# Patient Record
Sex: Male | Born: 1985
Health system: Southern US, Community
[De-identification: ages and names within clinical notes are randomized; demographics above are authoritative.]

## PROBLEM LIST (undated history)

## (undated) DIAGNOSIS — M1990 Unspecified osteoarthritis, unspecified site: Secondary | ICD-10-CM

## (undated) DIAGNOSIS — B019 Varicella without complication: Secondary | ICD-10-CM

## (undated) HISTORY — DX: Unspecified osteoarthritis, unspecified site: M19.90

## (undated) HISTORY — PX: WISDOM TOOTH EXTRACTION: SHX21

## (undated) HISTORY — PX: OTHER SURGICAL HISTORY: SHX169

## (undated) HISTORY — DX: Varicella without complication: B01.9

## (undated) HISTORY — PX: SPINE SURGERY: SHX786

---
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
## 2004-02-28 ENCOUNTER — Emergency Department (HOSPITAL_COMMUNITY): Admission: EM | Admit: 2004-02-28 | Discharge: 2004-02-29 | Payer: Self-pay | Admitting: *Deleted

## 2006-09-02 ENCOUNTER — Emergency Department (HOSPITAL_COMMUNITY): Admission: EM | Admit: 2006-09-02 | Discharge: 2006-09-02 | Payer: Self-pay | Admitting: Emergency Medicine

## 2017-04-26 ENCOUNTER — Ambulatory Visit: Payer: Self-pay | Admitting: Family Medicine

## 2017-05-17 ENCOUNTER — Ambulatory Visit (INDEPENDENT_AMBULATORY_CARE_PROVIDER_SITE_OTHER): Payer: 59 | Admitting: Family Medicine

## 2017-05-17 ENCOUNTER — Encounter: Payer: Self-pay | Admitting: Family Medicine

## 2017-05-17 VITALS — BP 124/66 | HR 90 | Temp 99.1°F | Ht 71.5 in | Wt 253.2 lb

## 2017-05-17 DIAGNOSIS — Z0000 Encounter for general adult medical examination without abnormal findings: Secondary | ICD-10-CM | POA: Diagnosis not present

## 2017-05-17 DIAGNOSIS — E669 Obesity, unspecified: Secondary | ICD-10-CM

## 2017-05-17 DIAGNOSIS — Z13220 Encounter for screening for lipoid disorders: Secondary | ICD-10-CM | POA: Diagnosis not present

## 2017-05-17 NOTE — Patient Instructions (Addendum)
We will try to see if you have had a Tetanus shot (Tdap) with Dr. Loleta ChanceHill- please before the baby comes call us to make sure that you have- because otherwise you should get this before your wife gives birth.   Congrats again!   Tell your wife thank you for sending you our way. Pleasure to meet you today and look forward to being your doctor.   Most folks choose a pediatrician to care for your child but we are trained to take care of children as family doctors so if you want everyone to be cared for in the same place (either when baby born or even years from now we are happy to help)  Schedule a lab visit at the check out desk within 2 weeks. Return for future fasting labs meaning nothing but water after midnight please. Ok to take your medications with water.

## 2017-05-17 NOTE — Progress Notes (Signed)
Phone: [phone]  Subjective:  Patient presents today to establish care.  Prior patient of Dr. Loleta ChanceHill but last seen a few years ago Parke Simmers([facility]). Chief complaint-noted.   See problem oriented charting  The following were reviewed and entered/updated in epic: Past Medical History:  Diagnosis Date  . Chicken pox    Patient Active Problem List   Diagnosis Date Noted  . Obesity (BMI 30.0-34.9) 05/17/2017   Past Surgical History:  Procedure Laterality Date  . none      Family History  Problem Relation Age of Onset  . Diabetes Mother   . Hypertension Mother   . Hypertension Father   . Lung cancer Father        smoker  . Hypertension Brother   . Hypertension Maternal Grandmother   . Lung cancer Maternal Grandmother        non smoker  . Hypertension Paternal Grandmother     Medications- reviewed and updated Current Outpatient Medications  Medication Sig Dispense Refill  . multivitamin (ONE-A-DAY MEN'S) TABS tablet Take 1 tablet by mouth daily.     No current facility-administered medications for this visit.     Allergies-reviewed and updated No Known Allergies  Social History   Social History Narrative   Married 2018. Wife pregnant with first child- daughter.    Wife works IT with [employer].       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 Spears every morning.     ROS--Full ROS was completed Review of Systems  Constitutional: Negative for chills and fever.  HENT: Negative for hearing loss and tinnitus.   Eyes: Negative for blurred vision and double vision.  Respiratory: Negative for cough.   Cardiovascular: Negative for chest pain and palpitations.  Gastrointestinal: Negative for heartburn and nausea.  Genitourinary: Negative for dysuria and urgency.  Musculoskeletal: Negative for myalgias and neck pain.  Skin: Negative for  itching and rash.  Neurological: Negative for dizziness and headaches.  Endo/Heme/Allergies: Negative for polydipsia. Does not bruise/bleed easily.  Psychiatric/Behavioral: Negative for hallucinations and substance abuse.   Objective: BP 124/66 (BP Location: Left Arm, Patient Position: Sitting, Cuff Size: Large)   Pulse 90   Temp 99.1 F (37.3 C) (Oral)   Ht 5' 11.5" (1.816 m)   Wt 253 lb 3.2 oz (114.9 kg)   SpO2 95%   BMI 34.82 kg/m  Gen: NAD, resting comfortably, muscular build but has adiposity as well HEENT: Mucous membranes are moist. Oropharynx normal. TM normal. Eyes: sclera and lids normal, PERRLA Neck: no thyromegaly, no cervical lymphadenopathy CV: RRR no murmurs rubs or gallops Lungs: CTAB no crackles, wheeze, rhonchi Abdomen: soft/nontender/nondistended/normal bowel sounds. No rebound or guarding.  Ext: no edema Skin: warm, dry Neuro: 5/5 strength in upper and lower extremities, normal gait, normal reflexes  Assessment/Plan:  32 y.o. male presenting for annual physical.  Health Maintenance counseling: 1. Anticipatory guidance: Patient counseled regarding regular dental exams -q6 months, eye exams -no issues, wearing seatbelts.  2. Risk factor reduction:  Advised patient of need for regular exercise and diet rich and fruits and vegetables to reduce risk of heart attack and stroke. Exercise- 6 days a week. Diet-does pretty well M-Thursday. Weekends tend to be worse. Marland Kitchen. He would like to be around 240. Weight in college was 220-240- in hs had been as high as 270  Wt Readings from Last 3 Encounters:  05/17/17 253 lb 3.2 oz (114.9 kg)  3. Immunizations/screenings/ancillary studies- Tdap- get records- needs to have this if has not within 10 years with child on the way and needing pertusis immunizaation. Declines influenza shot. Advised to do once has first child.  4. Prostate cancer screening- no family history, start at age around 35.   5. Colon cancer screening - no family  history, start at age 66-50 61. Testicular cancer screening- advised monthly self exams  8. STD screening- patient opts out as monogamous. Was not tested before marriage- wife will be tested in pregnancy so he opts out   Status of chronic or acute concerns  Obesity- discussed as above working on weight loss- 230 is a reasonable goal with his build  Lab/Order associations: Preventative health care - Plan: CBC, Comprehensive metabolic panel, Lipid panel  Obesity (BMI 30.0-34.9) - Plan: CBC, Comprehensive metabolic panel  Screening for hyperlipidemia - Plan: Lipid panel  Return precautions advised.  Tana Conch, MD

## 2017-06-07 ENCOUNTER — Other Ambulatory Visit (INDEPENDENT_AMBULATORY_CARE_PROVIDER_SITE_OTHER): Payer: 59

## 2017-06-07 DIAGNOSIS — Z13220 Encounter for screening for lipoid disorders: Secondary | ICD-10-CM

## 2017-06-07 DIAGNOSIS — E669 Obesity, unspecified: Secondary | ICD-10-CM

## 2017-06-07 DIAGNOSIS — Z0000 Encounter for general adult medical examination without abnormal findings: Secondary | ICD-10-CM

## 2017-06-07 LAB — COMPREHENSIVE METABOLIC PANEL
ALT: 23 U/L (ref 0–53)
AST: 29 U/L (ref 0–37)
Albumin: 4 g/dL (ref 3.5–5.2)
Alkaline Phosphatase: 45 U/L (ref 39–117)
BILIRUBIN TOTAL: 1.1 mg/dL (ref 0.2–1.2)
BUN: 15 mg/dL (ref 6–23)
CALCIUM: 9.9 mg/dL (ref 8.4–10.5)
CO2: 32 meq/L (ref 19–32)
Chloride: 101 mEq/L (ref 96–112)
Creatinine, Ser: 1.08 mg/dL (ref 0.40–1.50)
GFR: 101.66 mL/min (ref >60.00)
Glucose, Bld: 89 mg/dL (ref 70–99)
Potassium: 4.2 mEq/L (ref 3.5–5.1)
Sodium: 139 mEq/L (ref 135–145)
Total Protein: 7.8 g/dL (ref 6.0–8.3)

## 2017-06-07 LAB — LIPID PANEL
CHOL/HDL RATIO: 3
Cholesterol: 158 mg/dL (ref 0–200)
HDL: 45.8 mg/dL (ref >39.00)
LDL CALC: 99 mg/dL (ref 0–99)
NonHDL: 111.8
TRIGLYCERIDES: 62 mg/dL (ref 0.0–149.0)
VLDL: 12.4 mg/dL (ref 0.0–40.0)

## 2017-06-07 LAB — CBC
HCT: 45.5 % (ref 39.0–52.0)
Hemoglobin: 15.6 g/dL (ref 13.0–17.0)
MCHC: 34.4 g/dL (ref 30.0–36.0)
MCV: 86.9 fl (ref 78.0–100.0)
PLATELETS: 234 10*3/uL (ref 150.0–400.0)
RBC: 5.24 Mil/uL (ref 4.22–5.81)
RDW: 13 % (ref 11.5–15.5)
WBC: 4.9 10*3/uL (ref 4.0–10.5)

## 2017-07-29 DIAGNOSIS — S335XXA Sprain of ligaments of lumbar spine, initial encounter: Secondary | ICD-10-CM | POA: Diagnosis not present

## 2017-07-29 DIAGNOSIS — M545 Low back pain: Secondary | ICD-10-CM | POA: Diagnosis not present

## 2017-07-29 DIAGNOSIS — G441 Vascular headache, not elsewhere classified: Secondary | ICD-10-CM | POA: Diagnosis not present

## 2017-07-29 DIAGNOSIS — M546 Pain in thoracic spine: Secondary | ICD-10-CM | POA: Diagnosis not present

## 2017-07-31 ENCOUNTER — Encounter (HOSPITAL_COMMUNITY): Payer: Self-pay

## 2017-07-31 ENCOUNTER — Inpatient Hospital Stay (HOSPITAL_COMMUNITY): Payer: 59

## 2017-07-31 ENCOUNTER — Encounter (HOSPITAL_COMMUNITY)
Admission: EM | Disposition: A | Discharge status: Rehabilitation Facility | Payer: Self-pay | Source: Home / Self Care | Attending: Neurological Surgery

## 2017-07-31 ENCOUNTER — Other Ambulatory Visit: Payer: Self-pay

## 2017-07-31 ENCOUNTER — Emergency Department (HOSPITAL_COMMUNITY): Payer: 59 | Admitting: Anesthesiology

## 2017-07-31 ENCOUNTER — Emergency Department (HOSPITAL_COMMUNITY): Payer: 59

## 2017-07-31 ENCOUNTER — Inpatient Hospital Stay (HOSPITAL_COMMUNITY)
Admission: EM | Admit: 2017-07-31 | Discharge: 2017-08-05 | DRG: 519 | Disposition: A | Discharge status: Rehabilitation Facility | Payer: 59 | Attending: Neurological Surgery | Admitting: Neurological Surgery

## 2017-07-31 DIAGNOSIS — G992 Myelopathy in diseases classified elsewhere: Secondary | ICD-10-CM | POA: Diagnosis not present

## 2017-07-31 DIAGNOSIS — E669 Obesity, unspecified: Secondary | ICD-10-CM | POA: Diagnosis not present

## 2017-07-31 DIAGNOSIS — G834 Cauda equina syndrome: Secondary | ICD-10-CM | POA: Diagnosis not present

## 2017-07-31 DIAGNOSIS — M62838 Other muscle spasm: Secondary | ICD-10-CM | POA: Diagnosis not present

## 2017-07-31 DIAGNOSIS — I6789 Other cerebrovascular disease: Secondary | ICD-10-CM | POA: Diagnosis not present

## 2017-07-31 DIAGNOSIS — E6609 Other obesity due to excess calories: Secondary | ICD-10-CM | POA: Diagnosis not present

## 2017-07-31 DIAGNOSIS — M4714 Other spondylosis with myelopathy, thoracic region: Secondary | ICD-10-CM | POA: Diagnosis not present

## 2017-07-31 DIAGNOSIS — M5106 Intervertebral disc disorders with myelopathy, lumbar region: Secondary | ICD-10-CM | POA: Diagnosis present

## 2017-07-31 DIAGNOSIS — S24109A Unspecified injury at unspecified level of thoracic spinal cord, initial encounter: Secondary | ICD-10-CM | POA: Diagnosis not present

## 2017-07-31 DIAGNOSIS — S335XXA Sprain of ligaments of lumbar spine, initial encounter: Secondary | ICD-10-CM | POA: Diagnosis not present

## 2017-07-31 DIAGNOSIS — G959 Disease of spinal cord, unspecified: Secondary | ICD-10-CM | POA: Diagnosis present

## 2017-07-31 DIAGNOSIS — R339 Retention of urine, unspecified: Secondary | ICD-10-CM | POA: Diagnosis not present

## 2017-07-31 DIAGNOSIS — Z6833 Body mass index (BMI) 33.0-33.9, adult: Secondary | ICD-10-CM | POA: Diagnosis not present

## 2017-07-31 DIAGNOSIS — M7989 Other specified soft tissue disorders: Secondary | ICD-10-CM | POA: Diagnosis not present

## 2017-07-31 DIAGNOSIS — K5903 Drug induced constipation: Secondary | ICD-10-CM | POA: Diagnosis not present

## 2017-07-31 DIAGNOSIS — Z4789 Encounter for other orthopedic aftercare: Secondary | ICD-10-CM | POA: Diagnosis not present

## 2017-07-31 DIAGNOSIS — K5900 Constipation, unspecified: Secondary | ICD-10-CM | POA: Diagnosis not present

## 2017-07-31 DIAGNOSIS — K30 Functional dyspepsia: Secondary | ICD-10-CM | POA: Diagnosis not present

## 2017-07-31 DIAGNOSIS — R202 Paresthesia of skin: Secondary | ICD-10-CM | POA: Diagnosis not present

## 2017-07-31 DIAGNOSIS — D72829 Elevated white blood cell count, unspecified: Secondary | ICD-10-CM | POA: Diagnosis not present

## 2017-07-31 DIAGNOSIS — M546 Pain in thoracic spine: Secondary | ICD-10-CM | POA: Diagnosis not present

## 2017-07-31 DIAGNOSIS — K567 Ileus, unspecified: Secondary | ICD-10-CM | POA: Diagnosis not present

## 2017-07-31 DIAGNOSIS — M792 Neuralgia and neuritis, unspecified: Secondary | ICD-10-CM | POA: Diagnosis not present

## 2017-07-31 DIAGNOSIS — G441 Vascular headache, not elsewhere classified: Secondary | ICD-10-CM | POA: Diagnosis not present

## 2017-07-31 DIAGNOSIS — M4804 Spinal stenosis, thoracic region: Secondary | ICD-10-CM | POA: Diagnosis not present

## 2017-07-31 DIAGNOSIS — F411 Generalized anxiety disorder: Secondary | ICD-10-CM | POA: Diagnosis not present

## 2017-07-31 DIAGNOSIS — Z419 Encounter for procedure for purposes other than remedying health state, unspecified: Secondary | ICD-10-CM

## 2017-07-31 DIAGNOSIS — Z981 Arthrodesis status: Secondary | ICD-10-CM | POA: Diagnosis not present

## 2017-07-31 DIAGNOSIS — R030 Elevated blood-pressure reading, without diagnosis of hypertension: Secondary | ICD-10-CM | POA: Diagnosis not present

## 2017-07-31 DIAGNOSIS — M545 Low back pain: Secondary | ICD-10-CM | POA: Diagnosis not present

## 2017-07-31 DIAGNOSIS — M4802 Spinal stenosis, cervical region: Secondary | ICD-10-CM | POA: Diagnosis not present

## 2017-07-31 DIAGNOSIS — M25562 Pain in left knee: Secondary | ICD-10-CM | POA: Diagnosis not present

## 2017-07-31 DIAGNOSIS — R200 Anesthesia of skin: Secondary | ICD-10-CM | POA: Diagnosis not present

## 2017-07-31 DIAGNOSIS — G8918 Other acute postprocedural pain: Secondary | ICD-10-CM | POA: Diagnosis not present

## 2017-07-31 LAB — BASIC METABOLIC PANEL
Anion gap: 7 (ref 5–15)
BUN: 14 mg/dL (ref 6–20)
CO2: 27 mmol/L (ref 22–32)
Calcium: 9.3 mg/dL (ref 8.9–10.3)
Chloride: 104 mmol/L (ref 101–111)
Creatinine, Ser: 1 mg/dL (ref 0.61–1.24)
GFR calc Af Amer: >60 mL/min (ref >60)
Glucose, Bld: 89 mg/dL (ref 65–99)
POTASSIUM: 3.8 mmol/L (ref 3.5–5.1)
SODIUM: 138 mmol/L (ref 135–145)

## 2017-07-31 LAB — CBC
HCT: 44.8 % (ref 39.0–52.0)
Hemoglobin: 15.4 g/dL (ref 13.0–17.0)
MCH: 29.4 pg (ref 26.0–34.0)
MCHC: 34.4 g/dL (ref 30.0–36.0)
MCV: 85.7 fL (ref 78.0–100.0)
PLATELETS: 214 10*3/uL (ref 150–400)
RBC: 5.23 MIL/uL (ref 4.22–5.81)
RDW: 12.4 % (ref 11.5–15.5)
WBC: 9.3 10*3/uL (ref 4.0–10.5)

## 2017-07-31 LAB — TYPE AND SCREEN
ABO/RH(D): O POS
Antibody Screen: NEGATIVE

## 2017-07-31 LAB — ABO/RH: ABO/RH(D): O POS

## 2017-07-31 SURGERY — THORACIC LAMINECTOMY FOR TUMOR
Anesthesia: General

## 2017-07-31 MED ORDER — MORPHINE SULFATE (PF) 4 MG/ML IV SOLN
4.0000 mg | Freq: Once | INTRAVENOUS | Status: AC
  Administered 2017-07-31: 4 mg via INTRAVENOUS
  Filled 2017-07-31: qty 1

## 2017-07-31 MED ORDER — SUCCINYLCHOLINE CHLORIDE 200 MG/10ML IV SOSY
PREFILLED_SYRINGE | INTRAVENOUS | Status: AC
  Filled 2017-07-31: qty 10

## 2017-07-31 MED ORDER — PROPOFOL 10 MG/ML IV BOLUS
INTRAVENOUS | Status: AC
  Filled 2017-07-31: qty 20

## 2017-07-31 MED ORDER — ONDANSETRON HCL 4 MG/2ML IJ SOLN
4.0000 mg | Freq: Once | INTRAMUSCULAR | Status: AC
  Administered 2017-07-31: 4 mg via INTRAVENOUS
  Filled 2017-07-31: qty 2

## 2017-07-31 MED ORDER — ACETAMINOPHEN 650 MG RE SUPP: 650.0000 mg | Freq: Four times a day (QID) | RECTAL | Status: DC | PRN

## 2017-07-31 MED ORDER — MIDAZOLAM HCL 2 MG/2ML IJ SOLN
INTRAMUSCULAR | Status: AC
  Filled 2017-07-31: qty 2

## 2017-07-31 MED ORDER — POTASSIUM CHLORIDE IN NACL 20-0.9 MEQ/L-% IV SOLN
INTRAVENOUS | Status: DC
  Administered 2017-08-01: 01:00:00 via INTRAVENOUS
  Filled 2017-07-31 (×3): qty 1000

## 2017-07-31 MED ORDER — LIDOCAINE 2% (20 MG/ML) 5 ML SYRINGE
INTRAMUSCULAR | Status: AC
  Filled 2017-07-31: qty 5

## 2017-07-31 MED ORDER — ACETAMINOPHEN 325 MG PO TABS: 650.0000 mg | ORAL_TABLET | Freq: Four times a day (QID) | ORAL | Status: DC | PRN

## 2017-07-31 MED ORDER — SODIUM CHLORIDE 0.9 % IV SOLN
Freq: Once | INTRAVENOUS | Status: AC
  Administered 2017-07-31: 21:00:00 via INTRAVENOUS

## 2017-07-31 MED ORDER — DEXAMETHASONE SODIUM PHOSPHATE 4 MG/ML IJ SOLN
4.0000 mg | Freq: Four times a day (QID) | INTRAMUSCULAR | Status: DC
  Administered 2017-08-01 (×3): 4 mg via INTRAVENOUS
  Filled 2017-07-31 (×3): qty 1

## 2017-07-31 MED ORDER — HYDROCODONE-ACETAMINOPHEN 5-325 MG PO TABS: 1.0000 | ORAL_TABLET | ORAL | Status: DC | PRN

## 2017-07-31 MED ORDER — METHYLPREDNISOLONE SODIUM SUCC 125 MG IJ SOLR
125.0000 mg | Freq: Once | INTRAMUSCULAR | Status: DC
  Filled 2017-07-31: qty 2

## 2017-07-31 MED ORDER — DEXAMETHASONE SODIUM PHOSPHATE 10 MG/ML IJ SOLN
10.0000 mg | Freq: Once | INTRAMUSCULAR | Status: AC
  Administered 2017-07-31: 10 mg via INTRAVENOUS
  Filled 2017-07-31: qty 1

## 2017-07-31 MED ORDER — SENNA 8.6 MG PO TABS: 1.0000 | ORAL_TABLET | Freq: Two times a day (BID) | ORAL | Status: DC

## 2017-07-31 MED ORDER — CEFAZOLIN SODIUM-DEXTROSE 2-4 GM/100ML-% IV SOLN
INTRAVENOUS | Status: AC
  Filled 2017-07-31: qty 100

## 2017-07-31 MED ORDER — SODIUM CHLORIDE 0.9% FLUSH: 3.0000 mL | Freq: Two times a day (BID) | INTRAVENOUS | Status: DC

## 2017-07-31 MED ORDER — FENTANYL CITRATE (PF) 250 MCG/5ML IJ SOLN
INTRAMUSCULAR | Status: AC
  Filled 2017-07-31: qty 5

## 2017-07-31 MED ORDER — ROCURONIUM BROMIDE 10 MG/ML (PF) SYRINGE
PREFILLED_SYRINGE | INTRAVENOUS | Status: AC
  Filled 2017-07-31: qty 5

## 2017-07-31 NOTE — ED Triage Notes (Signed)
Patient here by EMS after throwing a golf ball and feeling a pop in his legs and then could not move them.  Were all numb but now improving.  Pulses intact bilaterally.  A&Ox4.  Stated he has been walking funny for 2 weeks after a workout.  Seen a chiropractor with no improvement.

## 2017-07-31 NOTE — ED Notes (Signed)
Report given to OR RN   All belongings to be taken with patient.

## 2017-07-31 NOTE — ED Notes (Signed)
Patient transported to CT scan and then to MRI .

## 2017-07-31 NOTE — H&P (Signed)
Scott Lane is an 32 y.o. male.   HPI:  32 year old male came in to the ER today after an episode of leg weakness. States that he was throwing a golf ball when he felt an "electric shock" and unable to move his legs. Over the last 2.5 weeks he has noticed some leg weakness especially in his left leg but he has been able to walk. Did report some limping in his left leg. He has been seeing a [facility]. He denies any loss of bowel or bladder control. Reports numbness and tingling from below his nipple down. States that his leg strength has improved since the initial injury. Does not have any significant medical history.   Past Medical History:  Diagnosis Date  . Chicken pox     Past Surgical History:  Procedure Laterality Date  . none      No Known Allergies  Social History   Tobacco Use  . Smoking status: Never Smoker  . Smokeless tobacco: Never Used  Substance Use Topics  . Alcohol use: Yes    Alcohol/week: 1.2 oz    Types: 2 Standard drinks or equivalent per week    Family History  Problem Relation Age of Onset  . Diabetes Mother   . Hypertension Mother   . Hypertension Father   . Lung cancer Father        smoker  . Hypertension Brother   . Hypertension Maternal Grandmother   . Lung cancer Maternal Grandmother        non smoker  . Hypertension Paternal Grandmother      Review of Systems  Positive ROS: N and T from below nipple line down  All other systems have been reviewed and were otherwise negative with the exception of those mentioned in the HPI and as above.  Objective: Vital signs in last 24 hours: Temp:  [98.2 F (36.8 C)-98.3 F (36.8 C)] 98.3 F (36.8 C) (04/24 2103) Pulse Rate:  [81-82] 82 (04/24 2103) Resp:  [18-19] 18 (04/24 2103) BP: (126-127)/(78-86) 126/86 (04/24 2103) SpO2:  [97 %-98 %] 98 % (04/24 2103)  General Appearance: Alert, cooperative, no distress, appears stated age Head: Normocephalic, without obvious abnormality, atraumatic    Lungs: respirations unlabored Heart: Regular rate and rhythm Extremities: Extremities normal, atraumatic, no cyanosis or edema Pulses: 2+ and symmetric all extremities Skin: Skin color, texture, turgor normal, no rashes or lesions  NEUROLOGIC:   Mental status: A&O x4, no aphasia, good attention span, Memory and fund of knowledge Motor Exam - dorsiflexion 4/5 right and 3/5 left.  Sensory Exam - decreased sensation below the nipple line down. Reflexes: hyperreflexive Positive  Hoffman's, Positive clonus Coordination - grossly normal Gait - not tested Balance - not tested Cranial Nerves: I: smell Not tested  II: visual acuity  OS: na   OD: na  II: visual fields Full to confrontation  II: pupils   III,VII: ptosis None  III,IV,VI: extraocular muscles  Full ROM  V: mastication   V: facial light touch sensation    V,VII: corneal reflex    VII: facial muscle function - upper    VII: facial muscle function - lower   VIII: hearing Not tested  IX: soft palate elevation    IX,X: gag reflex   XI: trapezius strength    XI: sternocleidomastoid strength   XI: neck flexion strength    XII: tongue strength      Data Review Lab Results  Component Value Date   WBC 9.3  07/31/2017   HGB 15.4 07/31/2017   HCT 44.8 07/31/2017   MCV 85.7 07/31/2017   PLT 214 07/31/2017   Lab Results  Component Value Date   NA 138 07/31/2017   K 3.8 07/31/2017   CL 104 07/31/2017   CO2 27 07/31/2017   BUN 14 07/31/2017   CREATININE 1.00 07/31/2017   GLUCOSE 89 07/31/2017   No results found for: INR, PROTIME  Radiology: Mr Thoracic Spine Wo Contrast  Result Date: 07/31/2017 CLINICAL DATA:  The patient felt a pop in his legs with onset of inability to move them after throwing a golf ball today. Numbness. EXAM: MRI THORACIC AND LUMBAR SPINE WITHOUT CONTRAST TECHNIQUE: Multiplanar and multiecho pulse sequences of the thoracic and lumbar spine were obtained without intravenous contrast. COMPARISON:   None. FINDINGS: MRI THORACIC SPINE FINDINGS Alignment:  Maintained. Vertebrae: Height and signal are normal. This patient has a congenitally narrow central spinal canal due to short pedicle length throughout. Cord: There appears to be edema within the distal cord at the inferior margin at T10-11 just superior to the disc interspace. There may also be edema within the cord at T11-12 Paraspinal and other soft tissues: Negative. Disc levels: C7-T1: Negative. T1-2: There is ligamentum flavum thickening and a minimal disc bulge but the central spinal canal and foramina appear open. T1-2: Shallow bulge to the left effaces the ventral thecal sac. The foramina appear open. T2-3: There is ligamentum flavum thickening and a disc bulge. The cord is markedly deformed with a triangular configuration identified. Marked left foraminal narrowing is seen. T3-4: Shallow disc bulge effaces the ventral thecal sac. The foramina appear open. T4-5: Shallow disc bulge without stenosis. T5-6: Negative. T6-7: Ligamentum flavum thickening. No disc bulge or protrusion. The central canal and foramina are open. T7-8: Ligamentum flavum thickening.  Otherwise negative. T8-9: Ligamentum flavum thickening.  Otherwise negative. T9-10: There is ligamentum flavum thickening and a shallow disc bulge. Moderate central canal narrowing is present. The foramina appear open. T10-11: There is bulky ligamentum flavum thickening and a shallow disc bulge. There is severe central canal stenosis and marked flattening of the cord. Severe bilateral foraminal narrowing is also seen. T11-12: Bulky ligamentum flavum thickening. No disc bulge. The cord is deformed. The foramina are narrowed bilaterally. MRI LUMBAR SPINE FINDINGS Segmentation:  Standard. Alignment:  Maintained. Vertebrae: No fracture or worrisome lesion. Congenitally narrow central canal due to short pedicle length noted. Conus medullaris and cauda equina: Conus extends to the L1 level. Conus and cauda  equina appear normal. Paraspinal and other soft tissues: Negative. Disc levels: L1-2 is imaged in the sagittal plane only. There is a shallow disc bulge but the central canal and foramina appear open. L2-3: Negative. L3-4: Shallow disc bulge and ligamentum flavum thickening. The central canal and foramina remain open. There is some facet arthropathy. L4-5: Broad-based right paracentral disc protrusion causes narrowing in the right subarticular recess and impingement on the descending right L5 root. The ventral thecal sac is deformed by disc. Disc also results in mild to moderate right foraminal narrowing. The left foramen is open. L5-S1: Shallow disc bulge with an annular fissure. The central canal is open. Moderate to moderately severe bilateral foraminal narrowing is present. IMPRESSION: MR THORACIC SPINE IMPRESSION Marked congenital narrowing of the central canal throughout due to short pedicle length. Severe central canal stenosis at T9-10 due to a shallow disc bulge and bulky ligamentum flavum thickening. There appears to be edema within the cord at this level. Marked central canal  stenosis at T11-12 where there is bulky ligamentum flavum thickening. There appears to be edema within the cord at this level. Ligamentum flavum thickening and a shallow disc bulge at T2-3 cause moderately severe to severe central canal stenosis. The cord has an abnormal triangular configuration. No edema within the cord is seen at this level. There is also left foraminal narrowing at this level. MR LUMBAR SPINE IMPRESSION Congenitally narrow central spinal canal throughout. Broad-based right paracentral protrusion at L4-5 impinges on the descending right L5 root and causes moderate right foraminal narrowing. Moderate to moderately severe bilateral foraminal narrowing L5-S1. Critical Value/emergent results were called by telephone at the time of interpretation on 07/31/2017 at 8:36 pm to Dr. Shaune PollackAMERON ISAACS , who verbally acknowledged  these results. Electronically Signed   By: Drusilla Kannerhomas  Dalessio M.D.   On: 07/31/2017 20:43   Mr Lumbar Spine Wo Contrast  Result Date: 07/31/2017 CLINICAL DATA:  The patient felt a pop in his legs with onset of inability to move them after throwing a golf ball today. Numbness. EXAM: MRI THORACIC AND LUMBAR SPINE WITHOUT CONTRAST TECHNIQUE: Multiplanar and multiecho pulse sequences of the thoracic and lumbar spine were obtained without intravenous contrast. COMPARISON:  None. FINDINGS: MRI THORACIC SPINE FINDINGS Alignment:  Maintained. Vertebrae: Height and signal are normal. This patient has a congenitally narrow central spinal canal due to short pedicle length throughout. Cord: There appears to be edema within the distal cord at the inferior margin at T10-11 just superior to the disc interspace. There may also be edema within the cord at T11-12 Paraspinal and other soft tissues: Negative. Disc levels: C7-T1: Negative. T1-2: There is ligamentum flavum thickening and a minimal disc bulge but the central spinal canal and foramina appear open. T1-2: Shallow bulge to the left effaces the ventral thecal sac. The foramina appear open. T2-3: There is ligamentum flavum thickening and a disc bulge. The cord is markedly deformed with a triangular configuration identified. Marked left foraminal narrowing is seen. T3-4: Shallow disc bulge effaces the ventral thecal sac. The foramina appear open. T4-5: Shallow disc bulge without stenosis. T5-6: Negative. T6-7: Ligamentum flavum thickening. No disc bulge or protrusion. The central canal and foramina are open. T7-8: Ligamentum flavum thickening.  Otherwise negative. T8-9: Ligamentum flavum thickening.  Otherwise negative. T9-10: There is ligamentum flavum thickening and a shallow disc bulge. Moderate central canal narrowing is present. The foramina appear open. T10-11: There is bulky ligamentum flavum thickening and a shallow disc bulge. There is severe central canal stenosis  and marked flattening of the cord. Severe bilateral foraminal narrowing is also seen. T11-12: Bulky ligamentum flavum thickening. No disc bulge. The cord is deformed. The foramina are narrowed bilaterally. MRI LUMBAR SPINE FINDINGS Segmentation:  Standard. Alignment:  Maintained. Vertebrae: No fracture or worrisome lesion. Congenitally narrow central canal due to short pedicle length noted. Conus medullaris and cauda equina: Conus extends to the L1 level. Conus and cauda equina appear normal. Paraspinal and other soft tissues: Negative. Disc levels: L1-2 is imaged in the sagittal plane only. There is a shallow disc bulge but the central canal and foramina appear open. L2-3: Negative. L3-4: Shallow disc bulge and ligamentum flavum thickening. The central canal and foramina remain open. There is some facet arthropathy. L4-5: Broad-based right paracentral disc protrusion causes narrowing in the right subarticular recess and impingement on the descending right L5 root. The ventral thecal sac is deformed by disc. Disc also results in mild to moderate right foraminal narrowing. The left foramen is open.  L5-S1: Shallow disc bulge with an annular fissure. The central canal is open. Moderate to moderately severe bilateral foraminal narrowing is present. IMPRESSION: MR THORACIC SPINE IMPRESSION Marked congenital narrowing of the central canal throughout due to short pedicle length. Severe central canal stenosis at T9-10 due to a shallow disc bulge and bulky ligamentum flavum thickening. There appears to be edema within the cord at this level. Marked central canal stenosis at T11-12 where there is bulky ligamentum flavum thickening. There appears to be edema within the cord at this level. Ligamentum flavum thickening and a shallow disc bulge at T2-3 cause moderately severe to severe central canal stenosis. The cord has an abnormal triangular configuration. No edema within the cord is seen at this level. There is also left  foraminal narrowing at this level. MR LUMBAR SPINE IMPRESSION Congenitally narrow central spinal canal throughout. Broad-based right paracentral protrusion at L4-5 impinges on the descending right L5 root and causes moderate right foraminal narrowing. Moderate to moderately severe bilateral foraminal narrowing L5-S1. Critical Value/emergent results were called by telephone at the time of interpretation on 07/31/2017 at 8:36 pm to Dr. Shaune Pollack , who verbally acknowledged these results. Electronically Signed   By: Drusilla Kanner M.D.   On: 07/31/2017 20:43    Assessment/Plan: 32 year old male presented to the ER with sudden onset of leg paralysis after throwing a golf ball. MRI thoracic spine shows severe stenosis at T2-3, T9-10 and T10-11. Patients strength has improved since arrival to the ER. Since neurologic exam is improving and we have found him to have hyperreflexia in the upper extremities, we will cancel emergent thoracic decompression and MRI his cervical spine and CT his thoracic spine and plan on surgery in the next 24-72 hours. We are concerned about pharmacologic paralysis in prone positioning for surgery tonight without c-spine imaging. Admit to floor and start decadron (do not believe solumedrol protocol is indicated as risks outweigh benefits). Have discussed all of this with the patient and his family and they agree with the plan.   Tiana Loft [employer] 07/31/2017 10:21 PM

## 2017-07-31 NOTE — ED Provider Notes (Signed)
[facility] EMERGENCY DEPARTMENT Provider Note   CSN: [id number] Arrival date & time: 07/31/17  1826     History   Chief Complaint Chief Complaint  Patient presents with  . Fall  . Leg Injury    HPI MEYER ARORA is a 32 y.o. male.  HPI   32 year old male here with bilateral lower extremity weakness.  The patient states that for the last 2 weeks, he said noticed aching, mild weakness in his left greater than right legs.  He went to a chiropractor twice, including this morning, who performed some stretches and put him on NSAIDs.  He reports that he was out golfing today when he threw a golf ball.  He felt a popping sensation in his lower back.  He then fell to the ground.  He lost complete control of his bilateral legs.  He also lost complete sensation.  He does not believe he had urinary or bowel incontinence.  Since then, he has had mild return of right greater than left strength, as well as return of right-sided sensation but he continues to feel numbness on the left.  He has an aching, mild, lower back pain.  Denies any abdominal pain.  No other recent injuries.  No recent trauma.  He is fairly active and lifts weights often.  Past Medical History:  Diagnosis Date  . Chicken pox     Patient Active Problem List   Diagnosis Date Noted  . Obesity (BMI 30.0-34.9) 05/17/2017    Past Surgical History:  Procedure Laterality Date  . none          Home Medications    Prior to Admission medications   Medication Sig Start Date End Date Taking? Authorizing Provider  ibuprofen (ADVIL,MOTRIN) 200 MG tablet Take 200 mg by mouth 3 (three) times daily.   Yes [provider]  multivitamin (ONE-A-DAY MEN'S) TABS tablet Take 1 tablet by mouth daily.   Yes [provider]    Family History Family History  Problem Relation Age of Onset  . Diabetes Mother   . Hypertension Mother   . Hypertension Father   . Lung cancer Father        smoker  .  Hypertension Brother   . Hypertension Maternal Grandmother   . Lung cancer Maternal Grandmother        non smoker  . Hypertension Paternal Grandmother     Social History Social History   Tobacco Use  . Smoking status: Never Smoker  . Smokeless tobacco: Never Used  Substance Use Topics  . Alcohol use: Yes    Alcohol/week: 1.2 oz    Types: 2 Standard drinks or equivalent per week  . Drug use: No     Allergies   Patient has no known allergies.   Review of Systems Review of Systems  Constitutional: Negative for chills and fever.  HENT: Negative for ear pain and sore throat.   Eyes: Negative for pain and visual disturbance.  Respiratory: Negative for cough and shortness of breath.   Cardiovascular: Negative for chest pain and palpitations.  Gastrointestinal: Negative for abdominal pain and vomiting.  Genitourinary: Negative for dysuria and hematuria.  Musculoskeletal: Positive for back pain. Negative for arthralgias.  Skin: Negative for color change and rash.  Neurological: Positive for weakness and numbness. Negative for seizures and syncope.  All other systems reviewed and are negative.    Physical Exam Updated Vital Signs BP 126/86 (BP Location: Left Arm)   Pulse 82  Temp 98.3 F (36.8 C) (Oral)   Resp 18   SpO2 98%   Physical Exam  Constitutional: He is oriented to person, place, and time. He appears well-developed and well-nourished. No distress.  HENT:  Head: Normocephalic and atraumatic.  Eyes: Conjunctivae are normal.  Neck: Neck supple.  Cardiovascular: Normal rate, regular rhythm and normal heart sounds. Exam reveals no friction rub.  No murmur heard. Pulmonary/Chest: Effort normal and breath sounds normal. No respiratory distress. He has no wheezes. He has no rales.  Abdominal: He exhibits no distension.  Musculoskeletal: He exhibits no edema.  Neurological: He is alert and oriented to person, place, and time. He exhibits normal muscle tone.  Skin:  Skin is warm. Capillary refill takes less than 2 seconds.  Psychiatric: He has a normal mood and affect.  Nursing note and vitals reviewed.   Spine Exam: Inspection/Palpation: Moderate midline lower L spine TTP Strength:   5/5 bilateral hip flexion and extension 4/5 right knee extension, 3/5 left 0/5 bilateral dorsi/plantarflexion 5/5 throughout UE bilaterally  Sensation:  Intact throughout RLE DIminished to light touch throughout LLE  Reflexes:  3+ bilateral quad and achilles, very brisk  ED Treatments / Results  Labs (all labs ordered are listed, but only abnormal results are displayed) Labs Reviewed  CBC  BASIC METABOLIC PANEL  TYPE AND SCREEN  ABO/RH    EKG None  Radiology Mr Thoracic Spine Wo Contrast  Result Date: 07/31/2017 CLINICAL DATA:  The patient felt a pop in his legs with onset of inability to move them after throwing a golf ball today. Numbness. EXAM: MRI THORACIC AND LUMBAR SPINE WITHOUT CONTRAST TECHNIQUE: Multiplanar and multiecho pulse sequences of the thoracic and lumbar spine were obtained without intravenous contrast. COMPARISON:  None. FINDINGS: MRI THORACIC SPINE FINDINGS Alignment:  Maintained. Vertebrae: Height and signal are normal. This patient has a congenitally narrow central spinal canal due to short pedicle length throughout. Cord: There appears to be edema within the distal cord at the inferior margin at T10-11 just superior to the disc interspace. There may also be edema within the cord at T11-12 Paraspinal and other soft tissues: Negative. Disc levels: C7-T1: Negative. T1-2: There is ligamentum flavum thickening and a minimal disc bulge but the central spinal canal and foramina appear open. T1-2: Shallow bulge to the left effaces the ventral thecal sac. The foramina appear open. T2-3: There is ligamentum flavum thickening and a disc bulge. The cord is markedly deformed with a triangular configuration identified. Marked left foraminal narrowing  is seen. T3-4: Shallow disc bulge effaces the ventral thecal sac. The foramina appear open. T4-5: Shallow disc bulge without stenosis. T5-6: Negative. T6-7: Ligamentum flavum thickening. No disc bulge or protrusion. The central canal and foramina are open. T7-8: Ligamentum flavum thickening.  Otherwise negative. T8-9: Ligamentum flavum thickening.  Otherwise negative. T9-10: There is ligamentum flavum thickening and a shallow disc bulge. Moderate central canal narrowing is present. The foramina appear open. T10-11: There is bulky ligamentum flavum thickening and a shallow disc bulge. There is severe central canal stenosis and marked flattening of the cord. Severe bilateral foraminal narrowing is also seen. T11-12: Bulky ligamentum flavum thickening. No disc bulge. The cord is deformed. The foramina are narrowed bilaterally. MRI LUMBAR SPINE FINDINGS Segmentation:  Standard. Alignment:  Maintained. Vertebrae: No fracture or worrisome lesion. Congenitally narrow central canal due to short pedicle length noted. Conus medullaris and cauda equina: Conus extends to the L1 level. Conus and cauda equina appear normal. Paraspinal and other  soft tissues: Negative. Disc levels: L1-2 is imaged in the sagittal plane only. There is a shallow disc bulge but the central canal and foramina appear open. L2-3: Negative. L3-4: Shallow disc bulge and ligamentum flavum thickening. The central canal and foramina remain open. There is some facet arthropathy. L4-5: Broad-based right paracentral disc protrusion causes narrowing in the right subarticular recess and impingement on the descending right L5 root. The ventral thecal sac is deformed by disc. Disc also results in mild to moderate right foraminal narrowing. The left foramen is open. L5-S1: Shallow disc bulge with an annular fissure. The central canal is open. Moderate to moderately severe bilateral foraminal narrowing is present. IMPRESSION: MR THORACIC SPINE IMPRESSION Marked  congenital narrowing of the central canal throughout due to short pedicle length. Severe central canal stenosis at T9-10 due to a shallow disc bulge and bulky ligamentum flavum thickening. There appears to be edema within the cord at this level. Marked central canal stenosis at T11-12 where there is bulky ligamentum flavum thickening. There appears to be edema within the cord at this level. Ligamentum flavum thickening and a shallow disc bulge at T2-3 cause moderately severe to severe central canal stenosis. The cord has an abnormal triangular configuration. No edema within the cord is seen at this level. There is also left foraminal narrowing at this level. MR LUMBAR SPINE IMPRESSION Congenitally narrow central spinal canal throughout. Broad-based right paracentral protrusion at L4-5 impinges on the descending right L5 root and causes moderate right foraminal narrowing. Moderate to moderately severe bilateral foraminal narrowing L5-S1. Critical Value/emergent results were called by telephone at the time of interpretation on 07/31/2017 at 8:36 pm to Dr. Shaune Pollack , who verbally acknowledged these results. Electronically Signed   By: Drusilla Kanner M.D.   On: 07/31/2017 20:43   Mr Lumbar Spine Wo Contrast  Result Date: 07/31/2017 CLINICAL DATA:  The patient felt a pop in his legs with onset of inability to move them after throwing a golf ball today. Numbness. EXAM: MRI THORACIC AND LUMBAR SPINE WITHOUT CONTRAST TECHNIQUE: Multiplanar and multiecho pulse sequences of the thoracic and lumbar spine were obtained without intravenous contrast. COMPARISON:  None. FINDINGS: MRI THORACIC SPINE FINDINGS Alignment:  Maintained. Vertebrae: Height and signal are normal. This patient has a congenitally narrow central spinal canal due to short pedicle length throughout. Cord: There appears to be edema within the distal cord at the inferior margin at T10-11 just superior to the disc interspace. There may also be edema  within the cord at T11-12 Paraspinal and other soft tissues: Negative. Disc levels: C7-T1: Negative. T1-2: There is ligamentum flavum thickening and a minimal disc bulge but the central spinal canal and foramina appear open. T1-2: Shallow bulge to the left effaces the ventral thecal sac. The foramina appear open. T2-3: There is ligamentum flavum thickening and a disc bulge. The cord is markedly deformed with a triangular configuration identified. Marked left foraminal narrowing is seen. T3-4: Shallow disc bulge effaces the ventral thecal sac. The foramina appear open. T4-5: Shallow disc bulge without stenosis. T5-6: Negative. T6-7: Ligamentum flavum thickening. No disc bulge or protrusion. The central canal and foramina are open. T7-8: Ligamentum flavum thickening.  Otherwise negative. T8-9: Ligamentum flavum thickening.  Otherwise negative. T9-10: There is ligamentum flavum thickening and a shallow disc bulge. Moderate central canal narrowing is present. The foramina appear open. T10-11: There is bulky ligamentum flavum thickening and a shallow disc bulge. There is severe central canal stenosis and marked flattening of the  cord. Severe bilateral foraminal narrowing is also seen. T11-12: Bulky ligamentum flavum thickening. No disc bulge. The cord is deformed. The foramina are narrowed bilaterally. MRI LUMBAR SPINE FINDINGS Segmentation:  Standard. Alignment:  Maintained. Vertebrae: No fracture or worrisome lesion. Congenitally narrow central canal due to short pedicle length noted. Conus medullaris and cauda equina: Conus extends to the L1 level. Conus and cauda equina appear normal. Paraspinal and other soft tissues: Negative. Disc levels: L1-2 is imaged in the sagittal plane only. There is a shallow disc bulge but the central canal and foramina appear open. L2-3: Negative. L3-4: Shallow disc bulge and ligamentum flavum thickening. The central canal and foramina remain open. There is some facet arthropathy. L4-5:  Broad-based right paracentral disc protrusion causes narrowing in the right subarticular recess and impingement on the descending right L5 root. The ventral thecal sac is deformed by disc. Disc also results in mild to moderate right foraminal narrowing. The left foramen is open. L5-S1: Shallow disc bulge with an annular fissure. The central canal is open. Moderate to moderately severe bilateral foraminal narrowing is present. IMPRESSION: MR THORACIC SPINE IMPRESSION Marked congenital narrowing of the central canal throughout due to short pedicle length. Severe central canal stenosis at T9-10 due to a shallow disc bulge and bulky ligamentum flavum thickening. There appears to be edema within the cord at this level. Marked central canal stenosis at T11-12 where there is bulky ligamentum flavum thickening. There appears to be edema within the cord at this level. Ligamentum flavum thickening and a shallow disc bulge at T2-3 cause moderately severe to severe central canal stenosis. The cord has an abnormal triangular configuration. No edema within the cord is seen at this level. There is also left foraminal narrowing at this level. MR LUMBAR SPINE IMPRESSION Congenitally narrow central spinal canal throughout. Broad-based right paracentral protrusion at L4-5 impinges on the descending right L5 root and causes moderate right foraminal narrowing. Moderate to moderately severe bilateral foraminal narrowing L5-S1. Critical Value/emergent results were called by telephone at the time of interpretation on 07/31/2017 at 8:36 pm to Dr. Shaune PollackAMERON Cara Thaxton , who verbally acknowledged these results. Electronically Signed   By: Drusilla Kannerhomas  Dalessio M.D.   On: 07/31/2017 20:43    Procedures .Critical Care Performed by: Shaune PollackIsaacs, Thermon Zulauf, MD Authorized by: Shaune PollackIsaacs, Antonia Culbertson, MD   Critical care provider statement:    Critical care time (minutes):  35   Critical care time was exclusive of:  Separately billable procedures and treating other  patients and teaching time   Critical care was necessary to treat or prevent imminent or life-threatening deterioration of the following conditions:  CNS failure or compromise   Critical care was time spent personally by me on the following activities:  Development of treatment plan with patient or surrogate, discussions with consultants, evaluation of patient's response to treatment, examination of patient, obtaining history from patient or surrogate, ordering and performing treatments and interventions, ordering and review of laboratory studies, ordering and review of radiographic studies, pulse oximetry, re-evaluation of patient's condition and review of old charts   I assumed direction of critical care for this patient from another provider in my specialty: no     (including critical care time)  Medications Ordered in ED Medications  0.9 %  sodium chloride infusion ( Intravenous New Bag/Given 07/31/17 2051)  morphine 4 MG/ML injection 4 mg (4 mg Intravenous Given 07/31/17 2051)  ondansetron (ZOFRAN) injection 4 mg (4 mg Intravenous Given 07/31/17 2051)  dexamethasone (DECADRON) injection 10 mg (10  mg Intravenous Given 07/31/17 2055)     Initial Impression / Assessment and Plan / ED Course  I have reviewed the triage vital signs and the nursing notes.  Pertinent labs & imaging results that were available during my care of the patient were reviewed by me and considered in my medical decision making (see chart for details).  Clinical Course as of Aug 01 2202  Wed Jul 31, 2017  1915 Concern for cauda equina or other acute radiculopathy.  Patient with proximal muscle strength but no distal strength bilateral lower extremities, though weaker on the left.  I called MRI and patient will be taken emergently to MR.  IV Solu-Medrol given.   [CI]  2201 MRI confirms multilevel thoracic spine cord compression concerning for acute injury.  There is mild cord edema.  Neurosurgery was paged and is at the  bedside.  Patient is n.p.o.  Decadron was given.   [CI]    Clinical Course User Index [CI] Shaune Pollack, MD    32 year old male here with bilateral lower extremity weakness and hyperreflexia concerning for acute cord injury.  Patient taken emergently to MRI and neurosurgery consulted.  Patient was admitted.  Decadron given.   Final Clinical Impressions(s) / ED Diagnoses   Final diagnoses:  Cauda equina compression [facility])      Shaune Pollack, MD 07/31/17 2204

## 2017-08-01 ENCOUNTER — Encounter (HOSPITAL_COMMUNITY): Payer: Self-pay

## 2017-08-01 ENCOUNTER — Inpatient Hospital Stay (HOSPITAL_COMMUNITY): Payer: 59

## 2017-08-01 ENCOUNTER — Inpatient Hospital Stay (HOSPITAL_COMMUNITY): Payer: 59 | Admitting: Anesthesiology

## 2017-08-01 ENCOUNTER — Inpatient Hospital Stay (HOSPITAL_COMMUNITY)
Admission: EM | Disposition: A | Discharge status: Rehabilitation Facility | Payer: Self-pay | Source: Home / Self Care | Attending: Neurological Surgery

## 2017-08-01 DIAGNOSIS — G959 Disease of spinal cord, unspecified: Secondary | ICD-10-CM | POA: Diagnosis present

## 2017-08-01 HISTORY — PX: LUMBAR LAMINECTOMY/DECOMPRESSION MICRODISCECTOMY: SHX5026

## 2017-08-01 LAB — HIV ANTIBODY (ROUTINE TESTING W REFLEX): HIV SCREEN 4TH GENERATION: NONREACTIVE

## 2017-08-01 SURGERY — LUMBAR LAMINECTOMY/DECOMPRESSION MICRODISCECTOMY 3 LEVELS
Anesthesia: General | Site: Back

## 2017-08-01 MED ORDER — CEFAZOLIN SODIUM-DEXTROSE 2-4 GM/100ML-% IV SOLN
2.0000 g | Freq: Three times a day (TID) | INTRAVENOUS | Status: AC
  Administered 2017-08-02 (×2): 2 g via INTRAVENOUS
  Filled 2017-08-01 (×2): qty 100

## 2017-08-01 MED ORDER — ONDANSETRON HCL 4 MG/2ML IJ SOLN
INTRAMUSCULAR | Status: DC | PRN
  Administered 2017-08-01: 4 mg via INTRAVENOUS

## 2017-08-01 MED ORDER — METHOCARBAMOL 1000 MG/10ML IJ SOLN
500.0000 mg | Freq: Four times a day (QID) | INTRAVENOUS | Status: DC | PRN
  Filled 2017-08-01: qty 5

## 2017-08-01 MED ORDER — THROMBIN 5000 UNITS EX SOLR
CUTANEOUS | Status: AC
  Filled 2017-08-01: qty 5000

## 2017-08-01 MED ORDER — THROMBIN (RECOMBINANT) 5000 UNITS EX SOLR
OROMUCOSAL | Status: DC | PRN
  Administered 2017-08-01: 5 mL via TOPICAL
  Administered 2017-08-01: 18:00:00 via TOPICAL

## 2017-08-01 MED ORDER — SODIUM CHLORIDE 0.9% FLUSH: 3.0000 mL | INTRAVENOUS | Status: DC | PRN

## 2017-08-01 MED ORDER — PROMETHAZINE HCL 25 MG/ML IJ SOLN: 6.2500 mg | INTRAMUSCULAR | Status: DC | PRN

## 2017-08-01 MED ORDER — FENTANYL CITRATE (PF) 100 MCG/2ML IJ SOLN
INTRAMUSCULAR | Status: DC | PRN
  Administered 2017-08-01 (×6): 50 ug via INTRAVENOUS
  Administered 2017-08-01: 100 ug via INTRAVENOUS
  Administered 2017-08-01: 50 ug via INTRAVENOUS

## 2017-08-01 MED ORDER — FENTANYL CITRATE (PF) 250 MCG/5ML IJ SOLN
INTRAMUSCULAR | Status: AC
Start: 2017-08-01 — End: 2017-08-01
  Filled 2017-08-01: qty 5

## 2017-08-01 MED ORDER — ONDANSETRON HCL 4 MG/2ML IJ SOLN
4.0000 mg | Freq: Four times a day (QID) | INTRAMUSCULAR | Status: DC | PRN
  Administered 2017-08-02 – 2017-08-03 (×3): 4 mg via INTRAVENOUS
  Filled 2017-08-01 (×3): qty 2

## 2017-08-01 MED ORDER — LACTATED RINGERS IV SOLN
INTRAVENOUS | Status: DC
  Administered 2017-08-01 (×2): via INTRAVENOUS

## 2017-08-01 MED ORDER — LIDOCAINE 2% (20 MG/ML) 5 ML SYRINGE
INTRAMUSCULAR | Status: DC | PRN
  Administered 2017-08-01: 100 mg via INTRAVENOUS

## 2017-08-01 MED ORDER — FENTANYL CITRATE (PF) 250 MCG/5ML IJ SOLN
INTRAMUSCULAR | Status: AC
  Filled 2017-08-01: qty 5

## 2017-08-01 MED ORDER — POTASSIUM CHLORIDE IN NACL 20-0.9 MEQ/L-% IV SOLN: INTRAVENOUS | Status: DC

## 2017-08-01 MED ORDER — PROPOFOL 10 MG/ML IV BOLUS
INTRAVENOUS | Status: AC
  Filled 2017-08-01: qty 20

## 2017-08-01 MED ORDER — DIPHENHYDRAMINE HCL 25 MG PO CAPS
50.0000 mg | ORAL_CAPSULE | Freq: Once | ORAL | Status: AC
  Administered 2017-08-01: 50 mg via ORAL
  Filled 2017-08-01: qty 2

## 2017-08-01 MED ORDER — PHENOL 1.4 % MT LIQD: 1.0000 | OROMUCOSAL | Status: DC | PRN

## 2017-08-01 MED ORDER — MORPHINE SULFATE (PF) 4 MG/ML IV SOLN
2.0000 mg | INTRAVENOUS | Status: DC | PRN
  Administered 2017-08-01: 2 mg via INTRAVENOUS
  Filled 2017-08-01 (×2): qty 1

## 2017-08-01 MED ORDER — METHOCARBAMOL 500 MG PO TABS
500.0000 mg | ORAL_TABLET | Freq: Four times a day (QID) | ORAL | Status: DC | PRN
  Administered 2017-08-02 – 2017-08-03 (×3): 500 mg via ORAL
  Filled 2017-08-01 (×3): qty 1

## 2017-08-01 MED ORDER — HYDROCODONE-ACETAMINOPHEN 7.5-325 MG PO TABS
1.0000 | ORAL_TABLET | Freq: Four times a day (QID) | ORAL | Status: DC
  Administered 2017-08-01 – 2017-08-05 (×16): 1 via ORAL
  Filled 2017-08-01 (×17): qty 1

## 2017-08-01 MED ORDER — SODIUM CHLORIDE 0.9 % IV SOLN
250.0000 mL | INTRAVENOUS | Status: DC
  Administered 2017-08-02: 250 mL via INTRAVENOUS

## 2017-08-01 MED ORDER — ONDANSETRON HCL 4 MG PO TABS: 4.0000 mg | ORAL_TABLET | Freq: Four times a day (QID) | ORAL | Status: DC | PRN

## 2017-08-01 MED ORDER — SUGAMMADEX SODIUM 200 MG/2ML IV SOLN
INTRAVENOUS | Status: DC | PRN
  Administered 2017-08-01: 200 mg via INTRAVENOUS

## 2017-08-01 MED ORDER — MIDAZOLAM HCL 5 MG/5ML IJ SOLN
INTRAMUSCULAR | Status: DC | PRN
  Administered 2017-08-01: 2 mg via INTRAVENOUS

## 2017-08-01 MED ORDER — THROMBIN (RECOMBINANT) 20000 UNITS EX SOLR
CUTANEOUS | Status: DC | PRN
  Administered 2017-08-01: 20 mL via TOPICAL

## 2017-08-01 MED ORDER — DEXAMETHASONE 4 MG PO TABS
4.0000 mg | ORAL_TABLET | Freq: Four times a day (QID) | ORAL | Status: DC
  Administered 2017-08-01 – 2017-08-05 (×16): 4 mg via ORAL
  Filled 2017-08-01 (×16): qty 1

## 2017-08-01 MED ORDER — ACETAMINOPHEN 325 MG PO TABS
650.0000 mg | ORAL_TABLET | ORAL | Status: DC | PRN
  Administered 2017-08-02: 650 mg via ORAL
  Filled 2017-08-01: qty 2

## 2017-08-01 MED ORDER — SENNA 8.6 MG PO TABS
1.0000 | ORAL_TABLET | Freq: Two times a day (BID) | ORAL | Status: DC
Start: 2017-08-01 — End: 2017-08-05
  Administered 2017-08-01 – 2017-08-05 (×8): 8.6 mg via ORAL
  Filled 2017-08-01 (×8): qty 1

## 2017-08-01 MED ORDER — HYDROMORPHONE HCL 1 MG/ML IJ SOLN
0.5000 mg | INTRAMUSCULAR | Status: DC | PRN
  Administered 2017-08-01 – 2017-08-02 (×3): 0.5 mg via INTRAVENOUS
  Filled 2017-08-01 (×2): qty 1

## 2017-08-01 MED ORDER — CEFAZOLIN SODIUM-DEXTROSE 2-4 GM/100ML-% IV SOLN
INTRAVENOUS | Status: AC
  Filled 2017-08-01: qty 100

## 2017-08-01 MED ORDER — ROCURONIUM BROMIDE 10 MG/ML (PF) SYRINGE
PREFILLED_SYRINGE | INTRAVENOUS | Status: DC | PRN
  Administered 2017-08-01: 100 mg via INTRAVENOUS

## 2017-08-01 MED ORDER — DEXAMETHASONE SODIUM PHOSPHATE 10 MG/ML IJ SOLN
INTRAMUSCULAR | Status: DC | PRN
  Administered 2017-08-01: 4 mg via INTRAVENOUS

## 2017-08-01 MED ORDER — 0.9 % SODIUM CHLORIDE (POUR BTL) OPTIME
TOPICAL | Status: DC | PRN
  Administered 2017-08-01: 1000 mL

## 2017-08-01 MED ORDER — PROPOFOL 10 MG/ML IV BOLUS
INTRAVENOUS | Status: DC | PRN
  Administered 2017-08-01: 200 mg via INTRAVENOUS

## 2017-08-01 MED ORDER — MENTHOL 3 MG MT LOZG: 1.0000 | LOZENGE | OROMUCOSAL | Status: DC | PRN

## 2017-08-01 MED ORDER — MIDAZOLAM HCL 2 MG/2ML IJ SOLN
INTRAMUSCULAR | Status: AC
  Filled 2017-08-01: qty 2

## 2017-08-01 MED ORDER — THROMBIN 20000 UNITS EX SOLR
CUTANEOUS | Status: AC
  Filled 2017-08-01: qty 20000

## 2017-08-01 MED ORDER — CEFAZOLIN SODIUM-DEXTROSE 2-3 GM-%(50ML) IV SOLR
INTRAVENOUS | Status: DC | PRN
  Administered 2017-08-01: 2 g via INTRAVENOUS

## 2017-08-01 MED ORDER — CELECOXIB 200 MG PO CAPS
200.0000 mg | ORAL_CAPSULE | Freq: Two times a day (BID) | ORAL | Status: DC
  Administered 2017-08-01 – 2017-08-05 (×8): 200 mg via ORAL
  Filled 2017-08-01 (×8): qty 1

## 2017-08-01 MED ORDER — HYDROMORPHONE HCL 1 MG/ML IJ SOLN
0.2500 mg | INTRAMUSCULAR | Status: DC | PRN
  Filled 2017-08-01: qty 1

## 2017-08-01 MED ORDER — SODIUM CHLORIDE 0.9% FLUSH
3.0000 mL | Freq: Two times a day (BID) | INTRAVENOUS | Status: DC
  Administered 2017-08-01 – 2017-08-05 (×7): 3 mL via INTRAVENOUS

## 2017-08-01 MED ORDER — BUPIVACAINE HCL (PF) 0.25 % IJ SOLN
INTRAMUSCULAR | Status: DC | PRN
  Administered 2017-08-01: 6 mL

## 2017-08-01 MED ORDER — BUPIVACAINE HCL (PF) 0.25 % IJ SOLN
INTRAMUSCULAR | Status: AC
  Filled 2017-08-01: qty 30

## 2017-08-01 MED ORDER — ACETAMINOPHEN 650 MG RE SUPP: 650.0000 mg | RECTAL | Status: DC | PRN

## 2017-08-01 MED ORDER — DEXAMETHASONE SODIUM PHOSPHATE 4 MG/ML IJ SOLN
4.0000 mg | Freq: Four times a day (QID) | INTRAMUSCULAR | Status: DC
  Filled 2017-08-01: qty 1

## 2017-08-01 SURGICAL SUPPLY — 59 items
ADH SKN CLS APL DERMABOND .7 (GAUZE/BANDAGES/DRESSINGS) ×1
APL SKNCLS STERI-STRIP NONHPOA (GAUZE/BANDAGES/DRESSINGS) ×1
BAG DECANTER FOR FLEXI CONT (MISCELLANEOUS) ×3 IMPLANT
BENZOIN TINCTURE PRP APPL 2/3 (GAUZE/BANDAGES/DRESSINGS) ×3 IMPLANT
BUR MATCHSTICK NEURO 3.0 LAGG (BURR) ×3 IMPLANT
CANISTER SUCT 3000ML PPV (MISCELLANEOUS) ×3 IMPLANT
CARTRIDGE OIL MAESTRO DRILL (MISCELLANEOUS) ×1 IMPLANT
CLOSURE STERI-STRIP 1/2X4 (GAUZE/BANDAGES/DRESSINGS) ×2
CLOSURE WOUND 1/2 X4 (GAUZE/BANDAGES/DRESSINGS) ×1
CLSR STERI-STRIP ANTIMIC 1/2X4 (GAUZE/BANDAGES/DRESSINGS) ×2 IMPLANT
DERMABOND ADVANCED (GAUZE/BANDAGES/DRESSINGS) ×2
DERMABOND ADVANCED .7 DNX12 (GAUZE/BANDAGES/DRESSINGS) IMPLANT
DIFFUSER DRILL AIR PNEUMATIC (MISCELLANEOUS) ×3 IMPLANT
DRAPE C-ARM 35X43 STRL (DRAPES) ×4 IMPLANT
DRAPE LAPAROTOMY 100X72X124 (DRAPES) ×3 IMPLANT
DRAPE MICROSCOPE LEICA (MISCELLANEOUS) ×3 IMPLANT
DRAPE SURG 17X23 STRL (DRAPES) ×3 IMPLANT
DRSG OPSITE POSTOP 4X8 (GAUZE/BANDAGES/DRESSINGS) ×2 IMPLANT
DURAPREP 26ML APPLICATOR (WOUND CARE) ×3 IMPLANT
ELECT REM PT RETURN 9FT ADLT (ELECTROSURGICAL) ×3
ELECTRODE REM PT RTRN 9FT ADLT (ELECTROSURGICAL) ×1 IMPLANT
EVACUATOR 1/8 PVC DRAIN (DRAIN) ×2 IMPLANT
GAUZE SPONGE 4X4 16PLY XRAY LF (GAUZE/BANDAGES/DRESSINGS) IMPLANT
GLOVE BIO SURGEON STRL SZ7 (GLOVE) IMPLANT
GLOVE BIO SURGEON STRL SZ8 (GLOVE) ×3 IMPLANT
GLOVE BIOGEL PI IND STRL 7.0 (GLOVE) IMPLANT
GLOVE BIOGEL PI IND STRL 7.5 (GLOVE) IMPLANT
GLOVE BIOGEL PI IND STRL 8 (GLOVE) IMPLANT
GLOVE BIOGEL PI INDICATOR 7.0 (GLOVE)
GLOVE BIOGEL PI INDICATOR 7.5 (GLOVE) ×2
GLOVE BIOGEL PI INDICATOR 8 (GLOVE) ×4
GLOVE ECLIPSE 7.5 STRL STRAW (GLOVE) ×2 IMPLANT
GOWN STRL REUS W/ TWL LRG LVL3 (GOWN DISPOSABLE) IMPLANT
GOWN STRL REUS W/ TWL XL LVL3 (GOWN DISPOSABLE) ×1 IMPLANT
GOWN STRL REUS W/TWL 2XL LVL3 (GOWN DISPOSABLE) IMPLANT
GOWN STRL REUS W/TWL LRG LVL3 (GOWN DISPOSABLE)
GOWN STRL REUS W/TWL XL LVL3 (GOWN DISPOSABLE) ×6
HEMOSTAT POWDER KIT SURGIFOAM (HEMOSTASIS) ×2 IMPLANT
KIT BASIN OR (CUSTOM PROCEDURE TRAY) ×3 IMPLANT
KIT TURNOVER KIT B (KITS) ×3 IMPLANT
NDL HYPO 25X1 1.5 SAFETY (NEEDLE) ×1 IMPLANT
NDL SPNL 20GX3.5 QUINCKE YW (NEEDLE) IMPLANT
NEEDLE HYPO 25X1 1.5 SAFETY (NEEDLE) ×3 IMPLANT
NEEDLE SPNL 20GX3.5 QUINCKE YW (NEEDLE) IMPLANT
NS IRRIG 1000ML POUR BTL (IV SOLUTION) ×3 IMPLANT
OIL CARTRIDGE MAESTRO DRILL (MISCELLANEOUS) ×3
PACK LAMINECTOMY NEURO (CUSTOM PROCEDURE TRAY) ×3 IMPLANT
PAD ARMBOARD 7.5X6 YLW CONV (MISCELLANEOUS) ×9 IMPLANT
RUBBERBAND STERILE (MISCELLANEOUS) ×6 IMPLANT
SPONGE SURGIFOAM ABS GEL 100 (HEMOSTASIS) ×2 IMPLANT
SPONGE SURGIFOAM ABS GEL SZ50 (HEMOSTASIS) IMPLANT
STRIP CLOSURE SKIN 1/2X4 (GAUZE/BANDAGES/DRESSINGS) ×2 IMPLANT
SUT VIC AB 0 CT1 18XCR BRD8 (SUTURE) ×1 IMPLANT
SUT VIC AB 0 CT1 8-18 (SUTURE) ×6
SUT VIC AB 2-0 CP2 18 (SUTURE) ×3 IMPLANT
SUT VIC AB 3-0 SH 8-18 (SUTURE) ×5 IMPLANT
TOWEL GREEN STERILE (TOWEL DISPOSABLE) ×3 IMPLANT
TOWEL GREEN STERILE FF (TOWEL DISPOSABLE) ×3 IMPLANT
WATER STERILE IRR 1000ML POUR (IV SOLUTION) ×3 IMPLANT

## 2017-08-01 NOTE — ED Notes (Signed)
Pt c/o headache and does not want suppository. Will inform MD.

## 2017-08-01 NOTE — ED Notes (Signed)
Patient was given toothpaste and a toothbrush.

## 2017-08-01 NOTE — ED Notes (Addendum)
Pt noted as sitting at bedside. Made awareof strict bedrest and returns to bed.

## 2017-08-01 NOTE — Anesthesia Procedure Notes (Addendum)
Procedure Name: Intubation Date/Time: 08/01/2017 5:38 PM Performed by: Colin Benton, CRNA Pre-anesthesia Checklist: Patient identified, Emergency Drugs available, Suction available and Patient being monitored Patient Re-evaluated:Patient Re-evaluated prior to induction Oxygen Delivery Method: Circle system utilized Preoxygenation: Pre-oxygenation with 100% oxygen Induction Type: IV induction Ventilation: Mask ventilation without difficulty and Oral airway inserted - appropriate to patient size Laryngoscope Size: Mac and 3 Grade View: Grade I Tube type: Oral Tube size: 8.0 mm Number of attempts: 1 Airway Equipment and Method: Stylet Placement Confirmation: ETT inserted through vocal cords under direct vision,  positive ETCO2 and breath sounds checked- equal and bilateral Secured at: 24 cm Tube secured with: Tape Dental Injury: Teeth and Oropharynx as per pre-operative assessment

## 2017-08-01 NOTE — ED Notes (Signed)
Attempted to call report

## 2017-08-01 NOTE — Op Note (Signed)
08/01/2017  7:42 PM  PATIENT:  Scott Lane M Schiefelbein  32 y.o. male  PRE-OPERATIVE DIAGNOSIS:  Severe thoracic spinal stenosis with myelopathy and leg weakness with incomplete cord injury  POST-OPERATIVE DIAGNOSIS:  same  PROCEDURE:  Decompressive thoracic laminectomy, medial facetectomy T9-10 and T10-11 T11-12  SURGEON:  Marikay Alaravid Kuba Shepherd, MD  ASSISTANTS: DR Newell Coralnudelman  ANESTHESIA:   General  EBL: 50 ml  Total I/O In: 1000 [I.V.:1000] Out: 50 [Blood:50]  BLOOD ADMINISTERED: none  DRAINS: Medium Hemovac  SPECIMEN:  none  INDICATION FOR PROCEDURE: This patient presented with incomplete cord injury yesterday. He had improvement of his neurologic exam. MRI and CT scan showed severe spinal stenosis at T9-10 and T10-11 and T11-12. Recommended decompressive thoracic laminectomy T9-T12. Patient understood the risks, benefits, and alternatives and potential outcomes and wished to proceed.  PROCEDURE DETAILS: The patient was taken to the operating room and after induction of adequate generalized endotracheal anesthesia, the patient was rolled into the prone position on chest rolls and all pressure points were padded. The thoracic and lumbar region was cleaned and then prepped with DuraPrep and draped in the usual sterile fashion. Incision was marked with AP fluoroscopy from the pedicle of T9 to the pedicle of T12. 5 cc of local anesthesia was injected and then a dorsal midline incision was made and carried down to the thoracic fascia. The fascia was opened and the paraspinous musculature was taken down in a subperiosteal fashion to expose T9-T12. Intraoperative fluoroscopy confirmed my level, and then I removed the spinous processes of T9 and T10 and T11 and used a combination of the high-speed drill and the Kerrison punches to perform a laminectomy, medial facetectomy, and foraminotomy at T9-10 and T10-11 and T11-12 bilaterally. The underlying yellow ligament was opened and removed in a piecemeal fashion to  expose the underlying dura and exiting nerve root. I spent considerable time drilling the lamina and the medial facets down to an eggshell. He has significant calcified ligament. This was drilled down to eggshell also. I undercut the lateral recess and dissected down until I was medial to and distal to the pedicle at each level. I checked a final x-ray and found that I was pedicle 9 to pedicle decompression. The dura was full capacious and no longer compressed. We felt like we had adequate decompression of the central canal and lateral recesses.  I irrigated with saline solution containing bacitracin. Achieved hemostasis with bipolar cautery and Surgifoam, lined the dura with Gelfoam, placed a medium Hemovac drain through a separate stab incision, and then closed the fascia with 0 Vicryl. I closed the subcutaneous tissues with 2-0 Vicryl and the subcuticular tissues with 3-0 Vicryl. The skin was then closed with benzoin and Steri-Strips. The drapes were removed, a sterile dressing was applied. The patient was awakened from general anesthesia and transferred to the recovery room in stable condition. At the end of the procedure all sponge, needle and instrument counts were correct.    PLAN OF CARE: Admit to inpatient   PATIENT DISPOSITION:  PACU - hemodynamically stable.   Delay start of Pharmacological VTE agent (>24hrs) due to surgical blood loss or risk of bleeding:  yes

## 2017-08-01 NOTE — ED Notes (Signed)
Pt states need medication to sleep. Provider notified. Verbal med order given

## 2017-08-01 NOTE — Anesthesia Preprocedure Evaluation (Addendum)
Anesthesia Evaluation  Patient identified by MRN, date of birth, ID band Patient awake    Reviewed: Allergy & Precautions, NPO status , Patient's Chart, lab work & pertinent test results  History of Anesthesia Complications Negative for: history of anesthetic complications  Airway Mallampati: II  TM Distance: >3 FB Neck ROM: Full    Dental no notable dental hx. (+) Dental Advisory Given   Pulmonary neg pulmonary ROS,    Pulmonary exam normal        Cardiovascular negative cardio ROS Normal cardiovascular exam     Neuro/Psych    GI/Hepatic negative GI ROS, Neg liver ROS,   Endo/Other  negative endocrine ROS  Renal/GU negative Renal ROS     Musculoskeletal negative musculoskeletal ROS (+)   Abdominal   Peds  Hematology negative hematology ROS (+)   Anesthesia Other Findings Day of surgery medications reviewed with the patient.  Reproductive/Obstetrics                            Anesthesia Physical Anesthesia Plan  ASA: III  Anesthesia Plan: General   Post-op Pain Management:    Induction: Intravenous  PONV Risk Score and Plan: 3 and Ondansetron, Dexamethasone and Scopolamine patch - Pre-op  Airway Management Planned: Oral ETT  Additional Equipment:   Intra-op Plan:   Post-operative Plan: Extubation in OR  Informed Consent: I have reviewed the patients History and Physical, chart, labs and discussed the procedure including the risks, benefits and alternatives for the proposed anesthesia with the patient or authorized representative who has indicated his/her understanding and acceptance.   Dental advisory given  Plan Discussed with: CRNA, Anesthesiologist and Surgeon  Anesthesia Plan Comments:        Anesthesia Quick Evaluation

## 2017-08-01 NOTE — ED Notes (Signed)
Per RN request, paged admitting MD - [facility] [phone]. Spok[facility]ta Barree [facility]

## 2017-08-01 NOTE — Transfer of Care (Signed)
Immediate Anesthesia Transfer of Care Note  Patient: Orene DesanctisLee M Dildine  Procedure(s) Performed: Thoracic nine, ten, eleven, twelve laminectomy (N/A Back)  Patient Location: PACU  Anesthesia Type:General  Level of Consciousness: awake, alert  and oriented  Airway & Oxygen Therapy: Patient Spontanous Breathing and Patient connected to nasal cannula oxygen  Post-op Assessment: Report given to RN and Post -op Vital signs reviewed and stable  Post vital signs: Reviewed and stable  Last Vitals:  Vitals Value Taken Time  BP 125/99 08/01/2017  8:06 PM  Temp 36.7 C 08/01/2017  8:06 PM  Pulse 104 08/01/2017  8:07 PM  Resp 12 08/01/2017  8:07 PM  SpO2 96 % 08/01/2017  8:07 PM  Vitals shown include unvalidated device data.  Last Pain:  Vitals:   08/01/17 2006  TempSrc:   PainSc: (P) 0-No pain         Complications: No apparent anesthesia complications

## 2017-08-01 NOTE — Anesthesia Postprocedure Evaluation (Signed)
Anesthesia Post Note  Patient: Scott Lane  Procedure(s) Performed: Thoracic nine, ten, eleven, twelve laminectomy (N/A Back)     Patient location during evaluation: PACU Anesthesia Type: General Level of consciousness: awake and alert Pain management: pain level controlled Vital Signs Assessment: post-procedure vital signs reviewed and stable Respiratory status: spontaneous breathing, nonlabored ventilation, respiratory function stable and patient connected to nasal cannula oxygen Cardiovascular status: blood pressure returned to baseline and stable Postop Assessment: no apparent nausea or vomiting Anesthetic complications: no    Last Vitals:  Vitals:   08/01/17 1605 08/01/17 2006  BP: (!) 147/83 (!) 125/99  Pulse: 76 (!) 111  Resp:  11  Temp: 37.5 C 36.5 C  SpO2: 100% 97%    Last Pain:  Vitals:   08/01/17 2006  TempSrc:   PainSc: 0-No pain    LLE Motor Response: Purposeful movement (08/01/17 2006) LLE Sensation: Full sensation (08/01/17 2006) RLE Motor Response: Purposeful movement (08/01/17 2006) RLE Sensation: Full sensation (08/01/17 2006)      Takeia Ciaravino COKER

## 2017-08-01 NOTE — ED Notes (Signed)
Dr. Yetta BarreJones contacted with request for pain meds and made aware that pt was briefly out of bed and now informed of strict bedrest. Informed of recent neuro.

## 2017-08-01 NOTE — Progress Notes (Signed)
Patient ID: Scott Lane, male   DOB: Apr 18, 1985, 32 y.o.   MRN: [id number] Subjective: Patient reports improvement in his lower extremity function. No pain.  Objective: Vital signs in last 24 hours: Temp:  [98.2 F (36.8 C)-99.3 F (37.4 C)] 99.3 F (37.4 C) (04/25 0738) Pulse Rate:  [79-100] 95 (04/25 0930) Resp:  [16-19] 16 (04/25 0930) BP: (100-154)/(58-86) 100/70 (04/25 0930) SpO2:  [93 %-98 %] 96 % (04/25 0930)  Intake/Output from previous day: No intake/output data recorded. Intake/Output this shift: Total I/O In: 500 [I.V.:500] Out: 1000 [Urine:1000]  Neurologic: Grossly normal 2 and in bed exam with some mild weakness of the left dorsi and plantar flexion. He is certainly stronger this morning than he was last night in the ER.  Lab Results: Lab Results  Component Value Date   WBC 9.3 07/31/2017   HGB 15.4 07/31/2017   HCT 44.8 07/31/2017   MCV 85.7 07/31/2017   PLT 214 07/31/2017   No results found for: INR, PROTIME BMET Lab Results  Component Value Date   NA 138 07/31/2017   K 3.8 07/31/2017   CL 104 07/31/2017   CO2 27 07/31/2017   GLUCOSE 89 07/31/2017   BUN 14 07/31/2017   CREATININE 1.00 07/31/2017   CALCIUM 9.3 07/31/2017    Studies/Results: Ct Thoracic Spine Wo Contrast  Result Date: 08/01/2017 CLINICAL DATA:  Bilateral lower extremity weakness after throwing a ball, felt a pop. Leg weakness. EXAM: CT THORACIC SPINE WITHOUT CONTRAST TECHNIQUE: Multidetector CT images of the thoracic were obtained using the standard protocol without intravenous contrast. COMPARISON:  MRI thoracic spine July 31, 2017 FINDINGS: ALIGNMENT: Maintained thoracic lordosis. No malalignment. VERTEBRAE: Vertebral bodies and posterior elements are intact. Intervertebral disc heights preserved, multilevel mild ventral endplate spurring. No destructive bony lesions. Congenital canal stenosis. PARASPINAL AND OTHER SOFT TISSUES: Included prevertebral and paraspinal soft tissues are  normal. DISC LEVELS: Multilevel calcified ligamentum flavum superimposed on congenital canal narrowing. Moderate canal stenosis T1-2, severe canal stenosis T2-3, moderate canal stenosis T3-4, T4-5, mild T5-6, mild T6-7, mild T7-8 with narrowed RIGHT lateral recess. Severe canal stenosis T9-10, T10-11 and T11-12. Severe neural foraminal narrowing at all levels varying from RIGHT to LEFT. IMPRESSION: 1. No fracture or malalignment. 2. Multilevel calcified ligamentum flavum superimposed on congenital canal narrowing. 3. Severe canal stenosis T2-3, T9-10 through T11-12. 4. Multilevel severe neural foraminal narrowing. Electronically Signed   By: Awilda Metroourtnay  Bloomer M.D.   On: 08/01/2017 00:25   Mr Cervical Spine Wo Contrast  Result Date: 08/01/2017 CLINICAL DATA:  Bilateral lower extremity weakness worsening for 2 weeks after throwing a golf ball. EXAM: MRI CERVICAL SPINE WITHOUT CONTRAST TECHNIQUE: Multiplanar, multisequence MR imaging of the cervical spine was performed. No intravenous contrast was administered. COMPARISON:  MRI and CT thoracic spine July 31, 2017. FINDINGS: ALIGNMENT: Straightened cervical lordosis.  No malalignment. VERTEBRAE/DISCS: Vertebral bodies are intact. Intervertebral disc morphology's and signal are normal. Mild chronic discogenic endplate changes C5-6. No abnormal or acute bone marrow signal. Mild congenital canal narrowing,, less significant than thoracic spine. CORD:Cervical spinal cord is normal morphology and signal characteristics from the cervicomedullary junction to level of T2-3, the most caudal well visualized level. POSTERIOR FOSSA, VERTEBRAL ARTERIES, PARASPINAL TISSUES: No MR findings of ligamentous injury. Vertebral artery flow voids present. Included posterior fossa and paraspinal soft tissues are normal. Mild platybasia. DISC LEVELS (mildly motion degraded axial sequences): C2-3: No disc bulge, canal stenosis nor neural foraminal narrowing. C3-4: Tiny broad-based central  disc protrusion. Minimal canal  stenosis. No neural foraminal narrowing. C4-5: Small broad-based central disc protrusion. Minimal canal stenosis. No neural foraminal narrowing C5-6: Small broad-based central disc protrusion and annular fissure. Minimal canal stenosis. No neural foraminal narrowing. C6-7 and C7-T1: No disc bulge, canal stenosis nor neural foraminal narrowing. IMPRESSION: 1. Congenital canal narrowing. No fracture, malalignment or acute osseous process. 2. Minimal canal stenosis C3-4 through C5-6. No neural foraminal narrowing. Electronically Signed   By: Awilda Metro M.D.   On: 08/01/2017 01:32   Mr Thoracic Spine Wo Contrast  Result Date: 07/31/2017 CLINICAL DATA:  The patient felt a pop in his legs with onset of inability to move them after throwing a golf ball today. Numbness. EXAM: MRI THORACIC AND LUMBAR SPINE WITHOUT CONTRAST TECHNIQUE: Multiplanar and multiecho pulse sequences of the thoracic and lumbar spine were obtained without intravenous contrast. COMPARISON:  None. FINDINGS: MRI THORACIC SPINE FINDINGS Alignment:  Maintained. Vertebrae: Height and signal are normal. This patient has a congenitally narrow central spinal canal due to short pedicle length throughout. Cord: There appears to be edema within the distal cord at the inferior margin at T10-11 just superior to the disc interspace. There may also be edema within the cord at T11-12 Paraspinal and other soft tissues: Negative. Disc levels: C7-T1: Negative. T1-2: There is ligamentum flavum thickening and a minimal disc bulge but the central spinal canal and foramina appear open. T1-2: Shallow bulge to the left effaces the ventral thecal sac. The foramina appear open. T2-3: There is ligamentum flavum thickening and a disc bulge. The cord is markedly deformed with a triangular configuration identified. Marked left foraminal narrowing is seen. T3-4: Shallow disc bulge effaces the ventral thecal sac. The foramina appear open.  T4-5: Shallow disc bulge without stenosis. T5-6: Negative. T6-7: Ligamentum flavum thickening. No disc bulge or protrusion. The central canal and foramina are open. T7-8: Ligamentum flavum thickening.  Otherwise negative. T8-9: Ligamentum flavum thickening.  Otherwise negative. T9-10: There is ligamentum flavum thickening and a shallow disc bulge. Moderate central canal narrowing is present. The foramina appear open. T10-11: There is bulky ligamentum flavum thickening and a shallow disc bulge. There is severe central canal stenosis and marked flattening of the cord. Severe bilateral foraminal narrowing is also seen. T11-12: Bulky ligamentum flavum thickening. No disc bulge. The cord is deformed. The foramina are narrowed bilaterally. MRI LUMBAR SPINE FINDINGS Segmentation:  Standard. Alignment:  Maintained. Vertebrae: No fracture or worrisome lesion. Congenitally narrow central canal due to short pedicle length noted. Conus medullaris and cauda equina: Conus extends to the L1 level. Conus and cauda equina appear normal. Paraspinal and other soft tissues: Negative. Disc levels: L1-2 is imaged in the sagittal plane only. There is a shallow disc bulge but the central canal and foramina appear open. L2-3: Negative. L3-4: Shallow disc bulge and ligamentum flavum thickening. The central canal and foramina remain open. There is some facet arthropathy. L4-5: Broad-based right paracentral disc protrusion causes narrowing in the right subarticular recess and impingement on the descending right L5 root. The ventral thecal sac is deformed by disc. Disc also results in mild to moderate right foraminal narrowing. The left foramen is open. L5-S1: Shallow disc bulge with an annular fissure. The central canal is open. Moderate to moderately severe bilateral foraminal narrowing is present. IMPRESSION: MR THORACIC SPINE IMPRESSION Marked congenital narrowing of the central canal throughout due to short pedicle length. Severe central  canal stenosis at T9-10 due to a shallow disc bulge and bulky ligamentum flavum thickening. There appears to  be edema within the cord at this level. Marked central canal stenosis at T11-12 where there is bulky ligamentum flavum thickening. There appears to be edema within the cord at this level. Ligamentum flavum thickening and a shallow disc bulge at T2-3 cause moderately severe to severe central canal stenosis. The cord has an abnormal triangular configuration. No edema within the cord is seen at this level. There is also left foraminal narrowing at this level. MR LUMBAR SPINE IMPRESSION Congenitally narrow central spinal canal throughout. Broad-based right paracentral protrusion at L4-5 impinges on the descending right L5 root and causes moderate right foraminal narrowing. Moderate to moderately severe bilateral foraminal narrowing L5-S1. Critical Value/emergent results were called by telephone at the time of interpretation on 07/31/2017 at 8:36 pm to Dr. Shaune Pollack , who verbally acknowledged these results. Electronically Signed   By: Drusilla Kanner M.D.   On: 07/31/2017 20:43   Mr Lumbar Spine Wo Contrast  Result Date: 07/31/2017 CLINICAL DATA:  The patient felt a pop in his legs with onset of inability to move them after throwing a golf ball today. Numbness. EXAM: MRI THORACIC AND LUMBAR SPINE WITHOUT CONTRAST TECHNIQUE: Multiplanar and multiecho pulse sequences of the thoracic and lumbar spine were obtained without intravenous contrast. COMPARISON:  None. FINDINGS: MRI THORACIC SPINE FINDINGS Alignment:  Maintained. Vertebrae: Height and signal are normal. This patient has a congenitally narrow central spinal canal due to short pedicle length throughout. Cord: There appears to be edema within the distal cord at the inferior margin at T10-11 just superior to the disc interspace. There may also be edema within the cord at T11-12 Paraspinal and other soft tissues: Negative. Disc levels: C7-T1: Negative.  T1-2: There is ligamentum flavum thickening and a minimal disc bulge but the central spinal canal and foramina appear open. T1-2: Shallow bulge to the left effaces the ventral thecal sac. The foramina appear open. T2-3: There is ligamentum flavum thickening and a disc bulge. The cord is markedly deformed with a triangular configuration identified. Marked left foraminal narrowing is seen. T3-4: Shallow disc bulge effaces the ventral thecal sac. The foramina appear open. T4-5: Shallow disc bulge without stenosis. T5-6: Negative. T6-7: Ligamentum flavum thickening. No disc bulge or protrusion. The central canal and foramina are open. T7-8: Ligamentum flavum thickening.  Otherwise negative. T8-9: Ligamentum flavum thickening.  Otherwise negative. T9-10: There is ligamentum flavum thickening and a shallow disc bulge. Moderate central canal narrowing is present. The foramina appear open. T10-11: There is bulky ligamentum flavum thickening and a shallow disc bulge. There is severe central canal stenosis and marked flattening of the cord. Severe bilateral foraminal narrowing is also seen. T11-12: Bulky ligamentum flavum thickening. No disc bulge. The cord is deformed. The foramina are narrowed bilaterally. MRI LUMBAR SPINE FINDINGS Segmentation:  Standard. Alignment:  Maintained. Vertebrae: No fracture or worrisome lesion. Congenitally narrow central canal due to short pedicle length noted. Conus medullaris and cauda equina: Conus extends to the L1 level. Conus and cauda equina appear normal. Paraspinal and other soft tissues: Negative. Disc levels: L1-2 is imaged in the sagittal plane only. There is a shallow disc bulge but the central canal and foramina appear open. L2-3: Negative. L3-4: Shallow disc bulge and ligamentum flavum thickening. The central canal and foramina remain open. There is some facet arthropathy. L4-5: Broad-based right paracentral disc protrusion causes narrowing in the right subarticular recess and  impingement on the descending right L5 root. The ventral thecal sac is deformed by disc. Disc also results in  mild to moderate right foraminal narrowing. The left foramen is open. L5-S1: Shallow disc bulge with an annular fissure. The central canal is open. Moderate to moderately severe bilateral foraminal narrowing is present. IMPRESSION: MR THORACIC SPINE IMPRESSION Marked congenital narrowing of the central canal throughout due to short pedicle length. Severe central canal stenosis at T9-10 due to a shallow disc bulge and bulky ligamentum flavum thickening. There appears to be edema within the cord at this level. Marked central canal stenosis at T11-12 where there is bulky ligamentum flavum thickening. There appears to be edema within the cord at this level. Ligamentum flavum thickening and a shallow disc bulge at T2-3 cause moderately severe to severe central canal stenosis. The cord has an abnormal triangular configuration. No edema within the cord is seen at this level. There is also left foraminal narrowing at this level. MR LUMBAR SPINE IMPRESSION Congenitally narrow central spinal canal throughout. Broad-based right paracentral protrusion at L4-5 impinges on the descending right L5 root and causes moderate right foraminal narrowing. Moderate to moderately severe bilateral foraminal narrowing L5-S1. Critical Value/emergent results were called by telephone at the time of interpretation on 07/31/2017 at 8:36 pm to Dr. Shaune Pollack , who verbally acknowledged these results. Electronically Signed   By: Drusilla Kanner M.D.   On: 07/31/2017 20:43    Assessment/Plan: CT and MRI reviewed. He has no snuff can cervical spine disease or cervical cord compression. He does have significant stenosis at T2-3 T9-10 T10-11 and T11-12 with calcified ligament. I have recommended a thoracic laminectomy from T9-T12. I think this is the symptomatic area. He understands the risk of the surgery include but are not limited to  bleeding, infection, CSF leak, spinal cord injury, paralysis, numbness, weakness, bowel or bladder or sexual function, need for further surgery, instability, nerve injury, lack of relief of symptoms, worsening symptoms, and anesthesia risk including DVT pneumonia MRI and death. He agrees to proceed. Try to do so this evening.  Estimated body mass index is 34.82 kg/m as calculated from the following:   Height as of 05/17/17: 5' 11.5" (1.816 m).   Weight as of 05/17/17: 114.9 kg (253 lb 3.2 oz).    LOS: 1 day    Kayson Tasker S 08/01/2017, 11:15 AM

## 2017-08-02 ENCOUNTER — Encounter (HOSPITAL_COMMUNITY): Payer: Self-pay | Admitting: Neurological Surgery

## 2017-08-02 DIAGNOSIS — G959 Disease of spinal cord, unspecified: Secondary | ICD-10-CM

## 2017-08-02 DIAGNOSIS — M4804 Spinal stenosis, thoracic region: Principal | ICD-10-CM

## 2017-08-02 LAB — MRSA PCR SCREENING: MRSA by PCR: NEGATIVE

## 2017-08-02 MED ORDER — TAMSULOSIN HCL 0.4 MG PO CAPS
0.4000 mg | ORAL_CAPSULE | Freq: Every day | ORAL | Status: DC
  Administered 2017-08-02 – 2017-08-05 (×4): 0.4 mg via ORAL
  Filled 2017-08-02 (×4): qty 1

## 2017-08-02 MED FILL — Thrombin For Soln 20000 Unit: CUTANEOUS | Qty: 1 | Status: AC

## 2017-08-02 MED FILL — Gelatin Absorbable MT Powder: OROMUCOSAL | Qty: 1 | Status: AC

## 2017-08-02 MED FILL — Thrombin For Soln 5000 Unit: CUTANEOUS | Qty: 5000 | Status: AC

## 2017-08-02 NOTE — Care Management Note (Signed)
Case Management Note  Patient Details  Name: Scott Lane MRN: [id number] Date of Birth: 21-Dec-1985  Subjective/Objective:   Pt is a 32 y/o male who presented to the ED on 4/24 with an episode of LE weakness; MRI and CT scan showed severe spinal stenosis at T9-10 and T10-11 and T11-12; pt is now s/p Decompressive thoracic laminectomy, medial facetectomy T9-10 and T10-11 T11-12 on 08/01/17.  PTA, pt independent, lives with spouse.                  Action/Plan: OT recommending CIR; PT eval pending.  Would recommend CIR consult for eligibility.  Will follow.  Expected Discharge Date:                  Expected Discharge Plan:  IP Rehab Facility  In-House Referral:     Discharge planning Services  CM Consult  Post Acute Care Choice:    Choice offered to:     DME Arranged:    DME Agency:     HH Arranged:    HH Agency:     Status of Service:  In process, will continue to follow  If discussed at Long Length of Stay Meetings, dates discussed:    Additional Comments:  Quintella Baton, RN, BSN  Trauma/Neuro ICU Case Manager [phone]

## 2017-08-02 NOTE — Progress Notes (Signed)
Patient ID: Scott Lane, male   DOB: [birth year], 32 y.o.   MRN: [id number] Subjective: Patient reports mid soreness, some tingling in feet similar to pre-op, no leg pain, had to be I/O cathed for 800 cc  Objective: Vital signs in last 24 hours: Temp:  [97.5 F (36.4 C)-99.5 F (37.5 C)] 98.8 F (37.1 C) (04/26 0322) Pulse Rate:  [73-111] 73 (04/26 0322) Resp:  [11-18] 18 (04/25 2300) BP: (100-154)/(47-129) 128/73 (04/26 0322) SpO2:  [93 %-100 %] 97 % (04/25 2300) Weight:  [111 kg (244 lb 11.4 oz)] 111 kg (244 lb 11.4 oz) (04/25 1605)  Intake/Output from previous day: 04/25 0701 - 04/26 0700 In: 1700 [I.V.:1700] Out: 2525 [Urine:2275; Blood:250] Intake/Output this shift: No intake/output data recorded.  Neurologic: Grossly normal except legs where he remains hyperreflexic, PF 4/5 R, 3/5 L, PF 4/5 B, HF 4/5 B, KE 4+/5 B  Lab Results: Lab Results  Component Value Date   WBC 9.3 07/31/2017   HGB 15.4 07/31/2017   HCT 44.8 07/31/2017   MCV 85.7 07/31/2017   PLT 214 07/31/2017   No results found for: INR, PROTIME BMET Lab Results  Component Value Date   NA 138 07/31/2017   K 3.8 07/31/2017   CL 104 07/31/2017   CO2 27 07/31/2017   GLUCOSE 89 07/31/2017   BUN 14 07/31/2017   CREATININE 1.00 07/31/2017   CALCIUM 9.3 07/31/2017    Studies/Results: Ct Thoracic Spine Wo Contrast  Result Date: 08/01/2017 CLINICAL DATA:  Bilateral lower extremity weakness after throwing a ball, felt a pop. Leg weakness. EXAM: CT THORACIC SPINE WITHOUT CONTRAST TECHNIQUE: Multidetector CT images of the thoracic were obtained using the standard protocol without intravenous contrast. COMPARISON:  MRI thoracic spine July 31, 2017 FINDINGS: ALIGNMENT: Maintained thoracic lordosis. No malalignment. VERTEBRAE: Vertebral bodies and posterior elements are intact. Intervertebral disc heights preserved, multilevel mild ventral endplate spurring. No destructive bony lesions. Congenital canal stenosis.  PARASPINAL AND OTHER SOFT TISSUES: Included prevertebral and paraspinal soft tissues are normal. DISC LEVELS: Multilevel calcified ligamentum flavum superimposed on congenital canal narrowing. Moderate canal stenosis T1-2, severe canal stenosis T2-3, moderate canal stenosis T3-4, T4-5, mild T5-6, mild T6-7, mild T7-8 with narrowed RIGHT lateral recess. Severe canal stenosis T9-10, T10-11 and T11-12. Severe neural foraminal narrowing at all levels varying from RIGHT to LEFT. IMPRESSION: 1. No fracture or malalignment. 2. Multilevel calcified ligamentum flavum superimposed on congenital canal narrowing. 3. Severe canal stenosis T2-3, T9-10 through T11-12. 4. Multilevel severe neural foraminal narrowing. Electronically Signed   By: Awilda Metro M.D.   On: 08/01/2017 00:25   Mr Cervical Spine Wo Contrast  Result Date: 08/01/2017 CLINICAL DATA:  Bilateral lower extremity weakness worsening for 2 weeks after throwing a golf ball. EXAM: MRI CERVICAL SPINE WITHOUT CONTRAST TECHNIQUE: Multiplanar, multisequence MR imaging of the cervical spine was performed. No intravenous contrast was administered. COMPARISON:  MRI and CT thoracic spine July 31, 2017. FINDINGS: ALIGNMENT: Straightened cervical lordosis.  No malalignment. VERTEBRAE/DISCS: Vertebral bodies are intact. Intervertebral disc morphology's and signal are normal. Mild chronic discogenic endplate changes C5-6. No abnormal or acute bone marrow signal. Mild congenital canal narrowing,, less significant than thoracic spine. CORD:Cervical spinal cord is normal morphology and signal characteristics from the cervicomedullary junction to level of T2-3, the most caudal well visualized level. POSTERIOR FOSSA, VERTEBRAL ARTERIES, PARASPINAL TISSUES: No MR findings of ligamentous injury. Vertebral artery flow voids present. Included posterior fossa and paraspinal soft tissues are normal. Mild platybasia. DISC LEVELS (mildly motion  degraded axial sequences): C2-3: No  disc bulge, canal stenosis nor neural foraminal narrowing. C3-4: Tiny broad-based central disc protrusion. Minimal canal stenosis. No neural foraminal narrowing. C4-5: Small broad-based central disc protrusion. Minimal canal stenosis. No neural foraminal narrowing C5-6: Small broad-based central disc protrusion and annular fissure. Minimal canal stenosis. No neural foraminal narrowing. C6-7 and C7-T1: No disc bulge, canal stenosis nor neural foraminal narrowing. IMPRESSION: 1. Congenital canal narrowing. No fracture, malalignment or acute osseous process. 2. Minimal canal stenosis C3-4 through C5-6. No neural foraminal narrowing. Electronically Signed   By: Awilda Metro M.D.   On: 08/01/2017 01:32   Mr Thoracic Spine Wo Contrast  Result Date: 07/31/2017 CLINICAL DATA:  The patient felt a pop in his legs with onset of inability to move them after throwing a golf ball today. Numbness. EXAM: MRI THORACIC AND LUMBAR SPINE WITHOUT CONTRAST TECHNIQUE: Multiplanar and multiecho pulse sequences of the thoracic and lumbar spine were obtained without intravenous contrast. COMPARISON:  None. FINDINGS: MRI THORACIC SPINE FINDINGS Alignment:  Maintained. Vertebrae: Height and signal are normal. This patient has a congenitally narrow central spinal canal due to short pedicle length throughout. Cord: There appears to be edema within the distal cord at the inferior margin at T10-11 just superior to the disc interspace. There may also be edema within the cord at T11-12 Paraspinal and other soft tissues: Negative. Disc levels: C7-T1: Negative. T1-2: There is ligamentum flavum thickening and a minimal disc bulge but the central spinal canal and foramina appear open. T1-2: Shallow bulge to the left effaces the ventral thecal sac. The foramina appear open. T2-3: There is ligamentum flavum thickening and a disc bulge. The cord is markedly deformed with a triangular configuration identified. Marked left foraminal narrowing is  seen. T3-4: Shallow disc bulge effaces the ventral thecal sac. The foramina appear open. T4-5: Shallow disc bulge without stenosis. T5-6: Negative. T6-7: Ligamentum flavum thickening. No disc bulge or protrusion. The central canal and foramina are open. T7-8: Ligamentum flavum thickening.  Otherwise negative. T8-9: Ligamentum flavum thickening.  Otherwise negative. T9-10: There is ligamentum flavum thickening and a shallow disc bulge. Moderate central canal narrowing is present. The foramina appear open. T10-11: There is bulky ligamentum flavum thickening and a shallow disc bulge. There is severe central canal stenosis and marked flattening of the cord. Severe bilateral foraminal narrowing is also seen. T11-12: Bulky ligamentum flavum thickening. No disc bulge. The cord is deformed. The foramina are narrowed bilaterally. MRI LUMBAR SPINE FINDINGS Segmentation:  Standard. Alignment:  Maintained. Vertebrae: No fracture or worrisome lesion. Congenitally narrow central canal due to short pedicle length noted. Conus medullaris and cauda equina: Conus extends to the L1 level. Conus and cauda equina appear normal. Paraspinal and other soft tissues: Negative. Disc levels: L1-2 is imaged in the sagittal plane only. There is a shallow disc bulge but the central canal and foramina appear open. L2-3: Negative. L3-4: Shallow disc bulge and ligamentum flavum thickening. The central canal and foramina remain open. There is some facet arthropathy. L4-5: Broad-based right paracentral disc protrusion causes narrowing in the right subarticular recess and impingement on the descending right L5 root. The ventral thecal sac is deformed by disc. Disc also results in mild to moderate right foraminal narrowing. The left foramen is open. L5-S1: Shallow disc bulge with an annular fissure. The central canal is open. Moderate to moderately severe bilateral foraminal narrowing is present. IMPRESSION: MR THORACIC SPINE IMPRESSION Marked  congenital narrowing of the central canal throughout due to short  pedicle length. Severe central canal stenosis at T9-10 due to a shallow disc bulge and bulky ligamentum flavum thickening. There appears to be edema within the cord at this level. Marked central canal stenosis at T11-12 where there is bulky ligamentum flavum thickening. There appears to be edema within the cord at this level. Ligamentum flavum thickening and a shallow disc bulge at T2-3 cause moderately severe to severe central canal stenosis. The cord has an abnormal triangular configuration. No edema within the cord is seen at this level. There is also left foraminal narrowing at this level. MR LUMBAR SPINE IMPRESSION Congenitally narrow central spinal canal throughout. Broad-based right paracentral protrusion at L4-5 impinges on the descending right L5 root and causes moderate right foraminal narrowing. Moderate to moderately severe bilateral foraminal narrowing L5-S1. Critical Value/emergent results were called by telephone at the time of interpretation on 07/31/2017 at 8:36 pm to Dr. Shaune PollackAMERON ISAACS , who verbally acknowledged these results. Electronically Signed   By: Drusilla Kannerhomas  Dalessio M.D.   On: 07/31/2017 20:43   Mr Lumbar Spine Wo Contrast  Result Date: 07/31/2017 CLINICAL DATA:  The patient felt a pop in his legs with onset of inability to move them after throwing a golf ball today. Numbness. EXAM: MRI THORACIC AND LUMBAR SPINE WITHOUT CONTRAST TECHNIQUE: Multiplanar and multiecho pulse sequences of the thoracic and lumbar spine were obtained without intravenous contrast. COMPARISON:  None. FINDINGS: MRI THORACIC SPINE FINDINGS Alignment:  Maintained. Vertebrae: Height and signal are normal. This patient has a congenitally narrow central spinal canal due to short pedicle length throughout. Cord: There appears to be edema within the distal cord at the inferior margin at T10-11 just superior to the disc interspace. There may also be edema  within the cord at T11-12 Paraspinal and other soft tissues: Negative. Disc levels: C7-T1: Negative. T1-2: There is ligamentum flavum thickening and a minimal disc bulge but the central spinal canal and foramina appear open. T1-2: Shallow bulge to the left effaces the ventral thecal sac. The foramina appear open. T2-3: There is ligamentum flavum thickening and a disc bulge. The cord is markedly deformed with a triangular configuration identified. Marked left foraminal narrowing is seen. T3-4: Shallow disc bulge effaces the ventral thecal sac. The foramina appear open. T4-5: Shallow disc bulge without stenosis. T5-6: Negative. T6-7: Ligamentum flavum thickening. No disc bulge or protrusion. The central canal and foramina are open. T7-8: Ligamentum flavum thickening.  Otherwise negative. T8-9: Ligamentum flavum thickening.  Otherwise negative. T9-10: There is ligamentum flavum thickening and a shallow disc bulge. Moderate central canal narrowing is present. The foramina appear open. T10-11: There is bulky ligamentum flavum thickening and a shallow disc bulge. There is severe central canal stenosis and marked flattening of the cord. Severe bilateral foraminal narrowing is also seen. T11-12: Bulky ligamentum flavum thickening. No disc bulge. The cord is deformed. The foramina are narrowed bilaterally. MRI LUMBAR SPINE FINDINGS Segmentation:  Standard. Alignment:  Maintained. Vertebrae: No fracture or worrisome lesion. Congenitally narrow central canal due to short pedicle length noted. Conus medullaris and cauda equina: Conus extends to the L1 level. Conus and cauda equina appear normal. Paraspinal and other soft tissues: Negative. Disc levels: L1-2 is imaged in the sagittal plane only. There is a shallow disc bulge but the central canal and foramina appear open. L2-3: Negative. L3-4: Shallow disc bulge and ligamentum flavum thickening. The central canal and foramina remain open. There is some facet arthropathy. L4-5:  Broad-based right paracentral disc protrusion causes narrowing in the right  subarticular recess and impingement on the descending right L5 root. The ventral thecal sac is deformed by disc. Disc also results in mild to moderate right foraminal narrowing. The left foramen is open. L5-S1: Shallow disc bulge with an annular fissure. The central canal is open. Moderate to moderately severe bilateral foraminal narrowing is present. IMPRESSION: MR THORACIC SPINE IMPRESSION Marked congenital narrowing of the central canal throughout due to short pedicle length. Severe central canal stenosis at T9-10 due to a shallow disc bulge and bulky ligamentum flavum thickening. There appears to be edema within the cord at this level. Marked central canal stenosis at T11-12 where there is bulky ligamentum flavum thickening. There appears to be edema within the cord at this level. Ligamentum flavum thickening and a shallow disc bulge at T2-3 cause moderately severe to severe central canal stenosis. The cord has an abnormal triangular configuration. No edema within the cord is seen at this level. There is also left foraminal narrowing at this level. MR LUMBAR SPINE IMPRESSION Congenitally narrow central spinal canal throughout. Broad-based right paracentral protrusion at L4-5 impinges on the descending right L5 root and causes moderate right foraminal narrowing. Moderate to moderately severe bilateral foraminal narrowing L5-S1. Critical Value/emergent results were called by telephone at the time of interpretation on 07/31/2017 at 8:36 pm to Dr. Shaune Pollack , who verbally acknowledged these results. Electronically Signed   By: Drusilla Kanner M.D.   On: 07/31/2017 20:43   Dg Thoracic Spine 1 View  Result Date: 08/01/2017 CLINICAL DATA:  LOCALIZATION IMAGING: Thoracic nine, ten, eleven, twelve laminectomy RSTO: BAP/CLH FLUORO TIME: 8 SECS EXAM: DG C-ARM 61-120 MIN; THORACIC SPINE - 1 VIEW COMPARISON:  CT 07/31/2017 FINDINGS:  Posterior retractor is in place. Surgical instruments overlie the thoracic spine, levels not distinguishable on this single image. IMPRESSION: Intraoperative localization. Electronically Signed   By: Norva Pavlov M.D.   On: 08/01/2017 19:55   Dg C-arm 1-60 Min  Result Date: 08/01/2017 CLINICAL DATA:  LOCALIZATION IMAGING: Thoracic nine, ten, eleven, twelve laminectomy RSTO: BAP/CLH FLUORO TIME: 8 SECS EXAM: DG C-ARM 61-120 MIN; THORACIC SPINE - 1 VIEW COMPARISON:  CT 07/31/2017 FINDINGS: Posterior retractor is in place. Surgical instruments overlie the thoracic spine, levels not distinguishable on this single image. IMPRESSION: Intraoperative localization. Electronically Signed   By: Norva Pavlov M.D.   On: 08/01/2017 19:55    Assessment/Plan: Seems to be doing well flomax Catheter if can't urinate PT/OT Ambulate with walker  Estimated body mass index is 33.19 kg/m as calculated from the following:   Height as of this encounter: 6' (1.829 m).   Weight as of this encounter: 111 kg (244 lb 11.4 oz).    LOS: 2 days    Diamante Truszkowski S 08/02/2017, 7:24 AM

## 2017-08-02 NOTE — Plan of Care (Signed)
  Problem: Activity: Goal: Ability to perform activities at highest level will improve Outcome: Progressing Goal: Muscle strength will improve Outcome: Progressing   Problem: Bowel/Gastric: Goal: Ability to demonstrate the techniques of an individualized bowel program will improve Outcome: Progressing   Problem: Education: Goal: Knowledge of disease or condition will improve Outcome: Progressing Goal: Knowledge of the prescribed therapeutic regimen will improve Outcome: Progressing   Problem: Coping: Goal: Ability to identify and develop effective coping behavior will improve Outcome: Progressing Goal: Ability to verbalize feelings will improve Outcome: Progressing   Problem: Self-Care: Goal: Ability to participate in self-care as condition permits will improve Outcome: Progressing   Problem: Skin Integrity: Goal: Risk for impaired skin integrity will decrease Outcome: Progressing   Problem: Urinary Elimination: Goal: Ability to achieve a regular elimination pattern will improve Outcome: Progressing   

## 2017-08-02 NOTE — Evaluation (Addendum)
Occupational Therapy Evaluation Patient Details Name: Scott Lane MRN: [id number] DOB: 1985-05-29 Today's Date: 08/02/2017    History of Present Illness Pt is a 32 y/o male who presented to the ED on 4/24 with an episode of LE weakness; MRI and CT scan showed severe spinal stenosis at T9-10 and T10-11 and T11-12; pt is now s/p Decompressive thoracic laminectomy, medial facetectomy T9-10 and T10-11 T11-12 on 08/01/17   Clinical Impression   This 32 y/o male presents with the above. At baseline pt is independent with ADLs, IADLs and functional mobility. Pt currently presenting with decreased dynamic balance, LE weakness, decreased functional performance. Pt demonstrating functional mobility this session with overall MinA (+2 for safety) using RW; currently requires mod-maxA for LB ADLs secondary to adhering to back precautions. Education provided regarding back precautions and safety with mobility, ADL completion. Pt will benefit from further review/education of ADL completion while adhering to precautions. Given Pt's PLOF feel pt is an excellent candidate for CIR level services to maximize his safety and independence with ADLs and mobility. Will continue to follow while in acute setting.     Follow Up Recommendations  CIR;Supervision/Assistance - 24 hour    Equipment Recommendations  Other (comment)(TBD in next venue )           Precautions / Restrictions Precautions Precautions: Back Precaution Comments: verbally reviewed back precautions Required Braces or Orthoses: ("no brace needed" per order set ) Restrictions Weight Bearing Restrictions: No      Mobility Bed Mobility Overal bed mobility: Needs Assistance Bed Mobility: Rolling;Sidelying to Sit Rolling: Min assist Sidelying to sit: Mod assist;+2 for safety/equipment       General bed mobility comments: verbal cues for correct log roll technique; assist to guide Pt while rolling to maintain alignment; assist to elevate  trunk into sitting  Transfers Overall transfer level: Needs assistance Equipment used: Rolling walker (2 wheeled) Transfers: Sit to/from Stand Sit to Stand: Min assist;+2 safety/equipment         General transfer comment: MinA from elevated EOB; VCs for safe hand placement     Balance Overall balance assessment: Needs assistance Sitting-balance support: Feet supported;No upper extremity supported Sitting balance-Leahy Scale: Fair     Standing balance support: Bilateral upper extremity supported Standing balance-Leahy Scale: Poor Standing balance comment: reliant on UE support during mobility                            ADL either performed or assessed with clinical judgement   ADL Overall ADL's : Needs assistance/impaired Eating/Feeding: Modified independent;Sitting   Grooming: Min guard;Minimal assistance;Standing   Upper Body Bathing: Min guard;Sitting   Lower Body Bathing: Moderate assistance;Sit to/from stand   Upper Body Dressing : Min guard;Sitting   Lower Body Dressing: Maximal assistance;Sit to/from stand   Toilet Transfer: Minimal assistance;Ambulation;BSC;RW Toilet Transfer Details (indicate cue type and reason): BSC over toilet; simulated in transfer to recliner  Toileting- Clothing Manipulation and Hygiene: Minimal assistance;Sit to/from stand       Functional mobility during ADLs: Minimal assistance;Min guard;Rolling walker General ADL Comments: pt completing room and hallway functional mobility using RW with MinGuard-MinA (+2 for safety this session) educated on adherence to back precautions while completing LB ADLs, will need to practice/review in additional tx sessions                          Pertinent Vitals/Pain Pain Assessment: Faces Faces Pain  Scale: Hurts a little bit Pain Location: back at surgical site  Pain Descriptors / Indicators: Operative site guarding Pain Intervention(s): Monitored during session;Repositioned           Extremity/Trunk Assessment Upper Extremity Assessment Upper Extremity Assessment: Overall WFL for tasks assessed   Lower Extremity Assessment Lower Extremity Assessment: Defer to PT evaluation       Communication Communication Communication: No difficulties   Cognition Arousal/Alertness: Awake/alert Behavior During Therapy: WFL for tasks assessed/performed Overall Cognitive Status: Within Functional Limits for tasks assessed                                                      Home Living Family/patient expects to be discharged to:: Private residence Living Arrangements: Spouse/significant other Available Help at Discharge: Family;Available PRN/intermittently(spouse works; may be able to work half days) Type of Home: House Home Access: Stairs to enter Secretary/administrator of Steps: 1   Home Layout: Two level Alternate Level Stairs-Number of Steps: flight  Alternate Level Stairs-Rails: Right Bathroom Shower/Tub: Chief Strategy Officer: Standard         Additional Comments: other family may be able to assist if 24hr supervision needed       Prior Functioning/Environment Level of Independence: Independent                 OT Problem List: Decreased strength;Impaired balance (sitting and/or standing);Decreased activity tolerance;Decreased knowledge of use of DME or AE;Decreased knowledge of precautions      OT Treatment/Interventions: Self-care/ADL training;DME and/or AE instruction;Therapeutic activities;Balance training;Therapeutic exercise;Patient/family education    OT Goals(Current goals can be found in the care plan section) Acute Rehab OT Goals Patient Stated Goal: hopeful for CIR before home; regain independence  OT Goal Formulation: With patient Time For Goal Achievement: 08/16/17 Potential to Achieve Goals: Good  OT Frequency: Min 2X/week               Co-evaluation PT/OT/SLP  Co-Evaluation/Treatment: Yes Reason for Co-Treatment: For patient/therapist safety;To address functional/ADL transfers   OT goals addressed during session: Proper use of Adaptive equipment and DME      AM-PAC PT "6 Clicks" Daily Activity     Outcome Measure Help from another person eating meals?: None Help from another person taking care of personal grooming?: A Little Help from another person toileting, which includes using toliet, bedpan, or urinal?: A Little Help from another person bathing (including washing, rinsing, drying)?: A Lot Help from another person to put on and taking off regular upper body clothing?: None Help from another person to put on and taking off regular lower body clothing?: A Lot 6 Click Score: 18   End of Session Equipment Utilized During Treatment: Gait belt;Rolling walker Nurse Communication: Mobility status  Activity Tolerance: Patient tolerated treatment well Patient left: in chair;with call bell/phone within reach;with family/visitor present  OT Visit Diagnosis: Muscle weakness (generalized) (M62.81);Unsteadiness on feet (R26.81)                Time: 9604-5409 OT Time Calculation (min): 30 min Charges:  OT General Charges $OT Visit: 1 Visit OT Evaluation $OT Eval Moderate Complexity: 1 Mod G-Codes:     Marcy Siren, OT Pager [phone] 08/02/2017  Orlando Penner 08/02/2017, 3:06 PM

## 2017-08-02 NOTE — Consult Note (Signed)
Physical Medicine and Rehabilitation Consult Reason for Consult:Lower ext weakness Referring Physician: Yetta Barre   HPI: Scott Lane is a 32 y.o. male admitted on 4/24 with persistent LE weakness after feeling an "electric shock" sensation run down his legs, when he threw a golf ball 2.5 weeks prior. [facility] MRI revealed severe spinal stenosis at T9-T12 with cord edema. Pt underwent thoracic decompression on 4/25 by Dr. Marikay Alar. PM&R was consulted to assess patient for rehab needs and potential for inpatient rehab.   Review of Systems  Constitutional: Negative for fever.  HENT: Negative for congestion.   Eyes: Negative for blurred vision.  Respiratory: Negative for hemoptysis.   Cardiovascular: Negative for chest pain.  Gastrointestinal: Positive for constipation and nausea.  Genitourinary: Negative.   Musculoskeletal: Positive for back pain.  Skin: Negative for rash.  Neurological: Positive for sensory change and focal weakness.  Psychiatric/Behavioral: Negative for depression.   Past Medical History:  Diagnosis Date  . Chicken pox    Past Surgical History:  Procedure Laterality Date  . LUMBAR LAMINECTOMY/DECOMPRESSION MICRODISCECTOMY N/A 08/01/2017   Procedure: Thoracic nine, ten, eleven, twelve laminectomy;  Surgeon: Tia Alert, MD;  Location: [facility];  Service: Neurosurgery;  Laterality: N/A;  . none     Family History  Problem Relation Age of Onset  . Hypertension Mother   . Hypertension Father   . Lung cancer Father        smoker  . Hypertension Brother   . Hypertension Maternal Grandmother   . Lung cancer Maternal Grandmother        non smoker  . Hypertension Paternal Grandmother    Social History:  reports that he has never smoked. He has never used smokeless tobacco. He reports that he drinks about 1.2 oz of alcohol per week. He reports that he does not use drugs. Allergies: No Known Allergies Medications Prior to Admission  Medication Sig Dispense Refill   . ibuprofen (ADVIL,MOTRIN) 200 MG tablet Take 200 mg by mouth 3 (three) times daily.    . multivitamin (ONE-A-DAY MEN'S) TABS tablet Take 1 tablet by mouth daily.      Home: Home Living Family/patient expects to be discharged to:: Private residence Living Arrangements: Spouse/significant other Available Help at Discharge: Family, Available PRN/intermittently Type of Home: House Home Access: Stairs to enter Secretary/administrator of Steps: 1 Entrance Stairs-Rails: Right, Left Home Layout: Two level Alternate Level Stairs-Number of Steps: flight  Alternate Level Stairs-Rails: Right Bathroom Shower/Tub: Engineer, manufacturing systems: Standard Home Equipment: None Additional Comments: other family may be able to assist if 24hr supervision needed   Functional History: Prior Function Level of Independence: Independent Functional Status:  Mobility: Bed Mobility Overal bed mobility: Needs Assistance Bed Mobility: Rolling, Sidelying to Sit Rolling: Min assist Sidelying to sit: Mod assist, +2 for safety/equipment General bed mobility comments: verbal cues for correct log roll technique; assist to guide Pt while rolling to maintain alignment; assist to elevate trunk into sitting Transfers Overall transfer level: Needs assistance Equipment used: Rolling walker (2 wheeled) Transfers: Sit to/from Stand Sit to Stand: Min assist, +2 safety/equipment General transfer comment: MinA from elevated EOB; VCs for safe hand placement  Ambulation/Gait Ambulation/Gait assistance: Min assist Ambulation Distance (Feet): 120 Feet Assistive device: Rolling walker (2 wheeled) Gait Pattern/deviations: Step-through pattern General Gait Details: pt with mild unsteadiness, more left LE than right.  Uncoordinated heel/toe pattern and moderately heavy use of the RW    ADL: ADL Overall ADL's : Needs  assistance/impaired Eating/Feeding: Modified independent, Sitting Grooming: Min guard, Minimal  assistance, Standing Upper Body Bathing: Min guard, Sitting Lower Body Bathing: Moderate assistance, Sit to/from stand Upper Body Dressing : Min guard, Sitting Lower Body Dressing: Maximal assistance, Sit to/from stand Toilet Transfer: Minimal assistance, Ambulation, BSC, RW Toilet Transfer Details (indicate cue type and reason): BSC over toilet; simulated in transfer to recliner  Toileting- Clothing Manipulation and Hygiene: Minimal assistance, Sit to/from stand Functional mobility during ADLs: Minimal assistance, Min guard, Rolling walker General ADL Comments: pt completing room and hallway functional mobility using RW with MinGuard-MinA (+2 for safety this session) educated on adherence to back precautions while completing LB ADLs, will need to practice/review in additional tx sessions   Cognition: Cognition Overall Cognitive Status: Within Functional Limits for tasks assessed Orientation Level: Oriented X4 Cognition Arousal/Alertness: Awake/alert Behavior During Therapy: WFL for tasks assessed/performed Overall Cognitive Status: Within Functional Limits for tasks assessed  Blood pressure 118/63, pulse 80, temperature 99.3 F (37.4 C), temperature source Oral, resp. rate 18, height 6' (1.829 m), weight 111 kg (244 lb 11.4 oz), SpO2 100 %. Physical Exam  Constitutional: He is oriented to person, place, and time. No distress.  HENT:  Head: Atraumatic.  Eyes: EOM are normal.  Neck: Neck supple.  Cardiovascular: Normal rate.  Respiratory: Effort normal.  GI: He exhibits distension.  Musculoskeletal:  Low back TTP  Neurological: He is alert and oriented to person, place, and time.  UE motor 5/5.LLE 3- to 3/5 prox to distal. RLE 3+ to 4-/5. Decreased LT left lower leg.   Psychiatric: He has a normal mood and affect. His behavior is normal.    No results found for this or any previous visit (from the past 24 hour(s)). Ct Thoracic Spine Wo Contrast  Result Date:  08/01/2017 CLINICAL DATA:  Bilateral lower extremity weakness after throwing a ball, felt a pop. Leg weakness. EXAM: CT THORACIC SPINE WITHOUT CONTRAST TECHNIQUE: Multidetector CT images of the thoracic were obtained using the standard protocol without intravenous contrast. COMPARISON:  MRI thoracic spine July 31, 2017 FINDINGS: ALIGNMENT: Maintained thoracic lordosis. No malalignment. VERTEBRAE: Vertebral bodies and posterior elements are intact. Intervertebral disc heights preserved, multilevel mild ventral endplate spurring. No destructive bony lesions. Congenital canal stenosis. PARASPINAL AND OTHER SOFT TISSUES: Included prevertebral and paraspinal soft tissues are normal. DISC LEVELS: Multilevel calcified ligamentum flavum superimposed on congenital canal narrowing. Moderate canal stenosis T1-2, severe canal stenosis T2-3, moderate canal stenosis T3-4, T4-5, mild T5-6, mild T6-7, mild T7-8 with narrowed RIGHT lateral recess. Severe canal stenosis T9-10, T10-11 and T11-12. Severe neural foraminal narrowing at all levels varying from RIGHT to LEFT. IMPRESSION: 1. No fracture or malalignment. 2. Multilevel calcified ligamentum flavum superimposed on congenital canal narrowing. 3. Severe canal stenosis T2-3, T9-10 through T11-12. 4. Multilevel severe neural foraminal narrowing. Electronically Signed   By: Awilda Metro M.D.   On: 08/01/2017 00:25   Mr Cervical Spine Wo Contrast  Result Date: 08/01/2017 CLINICAL DATA:  Bilateral lower extremity weakness worsening for 2 weeks after throwing a golf ball. EXAM: MRI CERVICAL SPINE WITHOUT CONTRAST TECHNIQUE: Multiplanar, multisequence MR imaging of the cervical spine was performed. No intravenous contrast was administered. COMPARISON:  MRI and CT thoracic spine July 31, 2017. FINDINGS: ALIGNMENT: Straightened cervical lordosis.  No malalignment. VERTEBRAE/DISCS: Vertebral bodies are intact. Intervertebral disc morphology's and signal are normal. Mild chronic  discogenic endplate changes C5-6. No abnormal or acute bone marrow signal. Mild congenital canal narrowing,, less significant than thoracic spine. CORD:Cervical spinal  cord is normal morphology and signal characteristics from the cervicomedullary junction to level of T2-3, the most caudal well visualized level. POSTERIOR FOSSA, VERTEBRAL ARTERIES, PARASPINAL TISSUES: No MR findings of ligamentous injury. Vertebral artery flow voids present. Included posterior fossa and paraspinal soft tissues are normal. Mild platybasia. DISC LEVELS (mildly motion degraded axial sequences): C2-3: No disc bulge, canal stenosis nor neural foraminal narrowing. C3-4: Tiny broad-based central disc protrusion. Minimal canal stenosis. No neural foraminal narrowing. C4-5: Small broad-based central disc protrusion. Minimal canal stenosis. No neural foraminal narrowing C5-6: Small broad-based central disc protrusion and annular fissure. Minimal canal stenosis. No neural foraminal narrowing. C6-7 and C7-T1: No disc bulge, canal stenosis nor neural foraminal narrowing. IMPRESSION: 1. Congenital canal narrowing. No fracture, malalignment or acute osseous process. 2. Minimal canal stenosis C3-4 through C5-6. No neural foraminal narrowing. Electronically Signed   By: Awilda Metro M.D.   On: 08/01/2017 01:32   Mr Thoracic Spine Wo Contrast  Result Date: 07/31/2017 CLINICAL DATA:  The patient felt a pop in his legs with onset of inability to move them after throwing a golf ball today. Numbness. EXAM: MRI THORACIC AND LUMBAR SPINE WITHOUT CONTRAST TECHNIQUE: Multiplanar and multiecho pulse sequences of the thoracic and lumbar spine were obtained without intravenous contrast. COMPARISON:  None. FINDINGS: MRI THORACIC SPINE FINDINGS Alignment:  Maintained. Vertebrae: Height and signal are normal. This patient has a congenitally narrow central spinal canal due to short pedicle length throughout. Cord: There appears to be edema within the distal  cord at the inferior margin at T10-11 just superior to the disc interspace. There may also be edema within the cord at T11-12 Paraspinal and other soft tissues: Negative. Disc levels: C7-T1: Negative. T1-2: There is ligamentum flavum thickening and a minimal disc bulge but the central spinal canal and foramina appear open. T1-2: Shallow bulge to the left effaces the ventral thecal sac. The foramina appear open. T2-3: There is ligamentum flavum thickening and a disc bulge. The cord is markedly deformed with a triangular configuration identified. Marked left foraminal narrowing is seen. T3-4: Shallow disc bulge effaces the ventral thecal sac. The foramina appear open. T4-5: Shallow disc bulge without stenosis. T5-6: Negative. T6-7: Ligamentum flavum thickening. No disc bulge or protrusion. The central canal and foramina are open. T7-8: Ligamentum flavum thickening.  Otherwise negative. T8-9: Ligamentum flavum thickening.  Otherwise negative. T9-10: There is ligamentum flavum thickening and a shallow disc bulge. Moderate central canal narrowing is present. The foramina appear open. T10-11: There is bulky ligamentum flavum thickening and a shallow disc bulge. There is severe central canal stenosis and marked flattening of the cord. Severe bilateral foraminal narrowing is also seen. T11-12: Bulky ligamentum flavum thickening. No disc bulge. The cord is deformed. The foramina are narrowed bilaterally. MRI LUMBAR SPINE FINDINGS Segmentation:  Standard. Alignment:  Maintained. Vertebrae: No fracture or worrisome lesion. Congenitally narrow central canal due to short pedicle length noted. Conus medullaris and cauda equina: Conus extends to the L1 level. Conus and cauda equina appear normal. Paraspinal and other soft tissues: Negative. Disc levels: L1-2 is imaged in the sagittal plane only. There is a shallow disc bulge but the central canal and foramina appear open. L2-3: Negative. L3-4: Shallow disc bulge and ligamentum  flavum thickening. The central canal and foramina remain open. There is some facet arthropathy. L4-5: Broad-based right paracentral disc protrusion causes narrowing in the right subarticular recess and impingement on the descending right L5 root. The ventral thecal sac is deformed by disc. Disc  also results in mild to moderate right foraminal narrowing. The left foramen is open. L5-S1: Shallow disc bulge with an annular fissure. The central canal is open. Moderate to moderately severe bilateral foraminal narrowing is present. IMPRESSION: MR THORACIC SPINE IMPRESSION Marked congenital narrowing of the central canal throughout due to short pedicle length. Severe central canal stenosis at T9-10 due to a shallow disc bulge and bulky ligamentum flavum thickening. There appears to be edema within the cord at this level. Marked central canal stenosis at T11-12 where there is bulky ligamentum flavum thickening. There appears to be edema within the cord at this level. Ligamentum flavum thickening and a shallow disc bulge at T2-3 cause moderately severe to severe central canal stenosis. The cord has an abnormal triangular configuration. No edema within the cord is seen at this level. There is also left foraminal narrowing at this level. MR LUMBAR SPINE IMPRESSION Congenitally narrow central spinal canal throughout. Broad-based right paracentral protrusion at L4-5 impinges on the descending right L5 root and causes moderate right foraminal narrowing. Moderate to moderately severe bilateral foraminal narrowing L5-S1. Critical Value/emergent results were called by telephone at the time of interpretation on 07/31/2017 at 8:36 pm to Dr. Shaune PollackAMERON ISAACS , who verbally acknowledged these results. Electronically Signed   By: Drusilla Kannerhomas  Dalessio M.D.   On: 07/31/2017 20:43   Mr Lumbar Spine Wo Contrast  Result Date: 07/31/2017 CLINICAL DATA:  The patient felt a pop in his legs with onset of inability to move them after throwing a golf  ball today. Numbness. EXAM: MRI THORACIC AND LUMBAR SPINE WITHOUT CONTRAST TECHNIQUE: Multiplanar and multiecho pulse sequences of the thoracic and lumbar spine were obtained without intravenous contrast. COMPARISON:  None. FINDINGS: MRI THORACIC SPINE FINDINGS Alignment:  Maintained. Vertebrae: Height and signal are normal. This patient has a congenitally narrow central spinal canal due to short pedicle length throughout. Cord: There appears to be edema within the distal cord at the inferior margin at T10-11 just superior to the disc interspace. There may also be edema within the cord at T11-12 Paraspinal and other soft tissues: Negative. Disc levels: C7-T1: Negative. T1-2: There is ligamentum flavum thickening and a minimal disc bulge but the central spinal canal and foramina appear open. T1-2: Shallow bulge to the left effaces the ventral thecal sac. The foramina appear open. T2-3: There is ligamentum flavum thickening and a disc bulge. The cord is markedly deformed with a triangular configuration identified. Marked left foraminal narrowing is seen. T3-4: Shallow disc bulge effaces the ventral thecal sac. The foramina appear open. T4-5: Shallow disc bulge without stenosis. T5-6: Negative. T6-7: Ligamentum flavum thickening. No disc bulge or protrusion. The central canal and foramina are open. T7-8: Ligamentum flavum thickening.  Otherwise negative. T8-9: Ligamentum flavum thickening.  Otherwise negative. T9-10: There is ligamentum flavum thickening and a shallow disc bulge. Moderate central canal narrowing is present. The foramina appear open. T10-11: There is bulky ligamentum flavum thickening and a shallow disc bulge. There is severe central canal stenosis and marked flattening of the cord. Severe bilateral foraminal narrowing is also seen. T11-12: Bulky ligamentum flavum thickening. No disc bulge. The cord is deformed. The foramina are narrowed bilaterally. MRI LUMBAR SPINE FINDINGS Segmentation:  Standard.  Alignment:  Maintained. Vertebrae: No fracture or worrisome lesion. Congenitally narrow central canal due to short pedicle length noted. Conus medullaris and cauda equina: Conus extends to the L1 level. Conus and cauda equina appear normal. Paraspinal and other soft tissues: Negative. Disc levels: L1-2 is imaged  in the sagittal plane only. There is a shallow disc bulge but the central canal and foramina appear open. L2-3: Negative. L3-4: Shallow disc bulge and ligamentum flavum thickening. The central canal and foramina remain open. There is some facet arthropathy. L4-5: Broad-based right paracentral disc protrusion causes narrowing in the right subarticular recess and impingement on the descending right L5 root. The ventral thecal sac is deformed by disc. Disc also results in mild to moderate right foraminal narrowing. The left foramen is open. L5-S1: Shallow disc bulge with an annular fissure. The central canal is open. Moderate to moderately severe bilateral foraminal narrowing is present. IMPRESSION: MR THORACIC SPINE IMPRESSION Marked congenital narrowing of the central canal throughout due to short pedicle length. Severe central canal stenosis at T9-10 due to a shallow disc bulge and bulky ligamentum flavum thickening. There appears to be edema within the cord at this level. Marked central canal stenosis at T11-12 where there is bulky ligamentum flavum thickening. There appears to be edema within the cord at this level. Ligamentum flavum thickening and a shallow disc bulge at T2-3 cause moderately severe to severe central canal stenosis. The cord has an abnormal triangular configuration. No edema within the cord is seen at this level. There is also left foraminal narrowing at this level. MR LUMBAR SPINE IMPRESSION Congenitally narrow central spinal canal throughout. Broad-based right paracentral protrusion at L4-5 impinges on the descending right L5 root and causes moderate right foraminal narrowing. Moderate  to moderately severe bilateral foraminal narrowing L5-S1. Critical Value/emergent results were called by telephone at the time of interpretation on 07/31/2017 at 8:36 pm to Dr. Shaune Pollack , who verbally acknowledged these results. Electronically Signed   By: Drusilla Kanner M.D.   On: 07/31/2017 20:43   Dg Thoracic Spine 1 View  Result Date: 08/01/2017 CLINICAL DATA:  LOCALIZATION IMAGING: Thoracic nine, ten, eleven, twelve laminectomy RSTO: BAP/CLH FLUORO TIME: 8 SECS EXAM: DG C-ARM 61-120 MIN; THORACIC SPINE - 1 VIEW COMPARISON:  CT 07/31/2017 FINDINGS: Posterior retractor is in place. Surgical instruments overlie the thoracic spine, levels not distinguishable on this single image. IMPRESSION: Intraoperative localization. Electronically Signed   By: Norva Pavlov M.D.   On: 08/01/2017 19:55   Dg C-arm 1-60 Min  Result Date: 08/01/2017 CLINICAL DATA:  LOCALIZATION IMAGING: Thoracic nine, ten, eleven, twelve laminectomy RSTO: BAP/CLH FLUORO TIME: 8 SECS EXAM: DG C-ARM 61-120 MIN; THORACIC SPINE - 1 VIEW COMPARISON:  CT 07/31/2017 FINDINGS: Posterior retractor is in place. Surgical instruments overlie the thoracic spine, levels not distinguishable on this single image. IMPRESSION: Intraoperative localization. Electronically Signed   By: Norva Pavlov M.D.   On: 08/01/2017 19:55    Assessment/Plan: Diagnosis: Thoracic stenosis with myelopathy 1. Does the need for close, 24 hr/day medical supervision in concert with the patient's rehab needs make it unreasonable for this patient to be served in a less intensive setting? Yes 2. Co-Morbidities requiring supervision/potential complications: constipation/neuorgenic bowel. ?neurogenic bladder?, pain control 3. Due to bladder management, bowel management, safety, skin/wound care, disease management, medication administration, pain management and patient education, does the patient require 24 hr/day rehab nursing? Yes 4. Does the patient require  coordinated care of a physician, rehab nurse, PT (1-2 hrs/day, 5 days/week) and OT (1-2 hrs/day, 5 days/week) to address physical and functional deficits in the context of the above medical diagnosis(es)? Yes Addressing deficits in the following areas: balance, endurance, locomotion, strength, transferring, bowel/bladder control, bathing, dressing, feeding, grooming, toileting and psychosocial support 5. Can the patient actively  participate in an intensive therapy program of at least 3 hrs of therapy per day at least 5 days per week? Yes 6. The potential for patient to make measurable gains while on inpatient rehab is excellent 7. Anticipated functional outcomes upon discharge from inpatient rehab are modified independent  with PT, modified independent with OT, n/a with SLP. 8. Estimated rehab length of stay to reach the above functional goals is: 7 days 9. Anticipated D/C setting: Home 10. Anticipated post D/C treatments: HH therapy and Outpatient therapy 11. Overall Rehab/Functional Prognosis: excellent  RECOMMENDATIONS: This patient's condition is appropriate for continued rehabilitative care in the following setting: CIR Patient has agreed to participate in recommended program. Yes Note that insurance prior authorization may be required for reimbursement for recommended care.  Comment: Rehab Admissions Coordinator to follow up.  Thanks,  Ranelle Oyster, MD, [place] [facility]     Ranelle Oyster, MD 08/02/2017

## 2017-08-03 MED ORDER — ZOLPIDEM TARTRATE 5 MG PO TABS
10.0000 mg | ORAL_TABLET | Freq: Every evening | ORAL | Status: DC | PRN
  Administered 2017-08-03 – 2017-08-04 (×2): 10 mg via ORAL
  Filled 2017-08-03 (×2): qty 2

## 2017-08-03 MED ORDER — METOCLOPRAMIDE HCL 5 MG/ML IJ SOLN
10.0000 mg | Freq: Four times a day (QID) | INTRAMUSCULAR | Status: AC
  Administered 2017-08-03 – 2017-08-04 (×4): 10 mg via INTRAVENOUS
  Filled 2017-08-03 (×4): qty 2

## 2017-08-03 MED ORDER — NALOXEGOL OXALATE 25 MG PO TABS
25.0000 mg | ORAL_TABLET | Freq: Every day | ORAL | Status: DC
Start: 2017-08-03 — End: 2017-08-05
  Administered 2017-08-03 – 2017-08-05 (×3): 25 mg via ORAL
  Filled 2017-08-03 (×3): qty 1

## 2017-08-03 NOTE — Progress Notes (Signed)
Patient ID: Scott Lane, male   DOB: [birth year], 32 y.o.   MRN: [id number] Subjective: Patient reports no flatus, nausea, mild back soreness, tingling in the left foot only, strength appears to be improving and just work with physical therapy  Objective: Vital signs in last 24 hours: Temp:  [97.6 F (36.4 C)-99.3 F (37.4 C)] 98.5 F (36.9 C) (04/27 0810) Pulse Rate:  [52-84] 71 (04/27 0810) Resp:  [18] 18 (04/27 0438) BP: (105-154)/(60-82) 119/70 (04/27 0810) SpO2:  [95 %-100 %] 96 % (04/27 0810)  Intake/Output from previous day: 04/26 0701 - 04/27 0700 In: 680 [P.O.:680] Out: 3870 [Urine:3730; Drains:140] Intake/Output this shift: Total I/O In: -  Out: 450 [Urine:450]  Seems stronger and his legs today. Still some weakness of dorsiflexion and plantar flexion, hip flexors and knee extensors appear to be quite strong, hyperreflexic but gait looks a little more fluid today  Lab Results: Lab Results  Component Value Date   WBC 9.3 07/31/2017   HGB 15.4 07/31/2017   HCT 44.8 07/31/2017   MCV 85.7 07/31/2017   PLT 214 07/31/2017   No results found for: INR, PROTIME BMET Lab Results  Component Value Date   NA 138 07/31/2017   K 3.8 07/31/2017   CL 104 07/31/2017   CO2 27 07/31/2017   GLUCOSE 89 07/31/2017   BUN 14 07/31/2017   CREATININE 1.00 07/31/2017   CALCIUM 9.3 07/31/2017    Studies/Results: Dg Thoracic Spine 1 View  Result Date: 08/01/2017 CLINICAL DATA:  LOCALIZATION IMAGING: Thoracic nine, ten, eleven, twelve laminectomy RSTO: BAP/CLH FLUORO TIME: 8 SECS EXAM: DG C-ARM 61-120 MIN; THORACIC SPINE - 1 VIEW COMPARISON:  CT 07/31/2017 FINDINGS: Posterior retractor is in place. Surgical instruments overlie the thoracic spine, levels not distinguishable on this single image. IMPRESSION: Intraoperative localization. Electronically Signed   By: Norva Pavlov M.D.   On: 08/01/2017 19:55   Dg C-arm 1-60 Min  Result Date: 08/01/2017 CLINICAL DATA:  LOCALIZATION  IMAGING: Thoracic nine, ten, eleven, twelve laminectomy RSTO: BAP/CLH FLUORO TIME: 8 SECS EXAM: DG C-ARM 61-120 MIN; THORACIC SPINE - 1 VIEW COMPARISON:  CT 07/31/2017 FINDINGS: Posterior retractor is in place. Surgical instruments overlie the thoracic spine, levels not distinguishable on this single image. IMPRESSION: Intraoperative localization. Electronically Signed   By: Norva Pavlov M.D.   On: 08/01/2017 19:55    Assessment/Plan: Overall doing well. We will remove catheter today for a voiding trial. Nursing aware. He probably has a postoperative ileus and therefore will start Movantik and Reglan, increase mobilization and decrease opioid use. We'll need CIR. Neurologically seems even better today  Estimated body mass index is 33.19 kg/m as calculated from the following:   Height as of this encounter: 6' (1.829 m).   Weight as of this encounter: 111 kg (244 lb 11.4 oz).    LOS: 3 days    Kinzleigh Kandler S 08/03/2017, 9:51 AM

## 2017-08-03 NOTE — Progress Notes (Signed)
Physical Therapy Treatment Patient Details Name: Scott Lane MRN: [id number] DOB: 03-29-1986 Today's Date: 08/03/2017    History of Present Illness Pt is a 32 y/o male who presented to the ED on 4/24 with an episode of LE weakness with fall after throwing a golf ball; MRI and CT scan showed severe spinal stenosis at T9-10 and T10-11 and T11-12; pt is now s/p Decompressive thoracic laminectomy, medial facetectomy T9-10 and T10-11 T11-12 on 08/01/17. No significant PMHx    PT Comments    Pt pleasant and very willing to mobilize. Pt with continued decreased strength and coordination of bil LE. Pt able to perform seated exercises with great difficulty activating hip flexion bil LE. Pt unable to ascend even one step for entrance into home and has a flight to the bedroom. Pt educated for all precautions and HEP and encouraged to continue mobility daily with nursing staff. Continue to recommend CIR as pt was very fit, working out daily and working as a Economist.     Follow Up Recommendations  CIR     Equipment Recommendations  Rolling walker with 5" wheels;3in1 (PT)    Recommendations for Other Services       Precautions / Restrictions Precautions Precautions: Back;Fall Precaution Booklet Issued: Yes (comment) Precaution Comments: educated for all precautions with handout provided Restrictions Weight Bearing Restrictions: No    Mobility  Bed Mobility               General bed mobility comments: in chair on arrival   Transfers Overall transfer level: Needs assistance   Transfers: Sit to/from Stand Sit to Stand: Min assist         General transfer comment: assist to fully rise, cues and assist for posture with rising  Ambulation/Gait Ambulation/Gait assistance: Min guard Ambulation Distance (Feet): 180 Feet Assistive device: Rolling walker (2 wheeled) Gait Pattern/deviations: Step-through pattern;Decreased stance time - left;Decreased stride  length   Gait velocity interpretation: <1.31 ft/sec, indicative of household ambulator General Gait Details: pt with short strides with inability to perform continual stepping using RW. pt with decreased stance on left stating leg feels unstable with heavy use of RW for stability and support   Stairs Stairs: Yes       General stair comments: pt attempted to ascend stairs with rail and HHA but unable to power up with RLE. Attempted backward pushing on therapist shoulder but again pt without strength to ascend step   Wheelchair Mobility    Modified Rankin (Stroke Patients Only)       Balance Overall balance assessment: Needs assistance   Sitting balance-Leahy Scale: Good       Standing balance-Leahy Scale: Poor                              Cognition Arousal/Alertness: Awake/alert Behavior During Therapy: WFL for tasks assessed/performed Overall Cognitive Status: Within Functional Limits for tasks assessed                                        Exercises General Exercises - Lower Extremity Long Arc Quad: AROM;15 reps;Seated;Both Hip Flexion/Marching: AROM;15 reps;Seated;Both    General Comments        Pertinent Vitals/Pain Pain Score: 3  Pain Location: back, left leg tingling Pain Descriptors / Indicators: Sore;Tingling Pain Intervention(s): Limited activity within patient's tolerance  Home Living                      Prior Function            PT Goals (current goals can now be found in the care plan section) Progress towards PT goals: Progressing toward goals    Frequency           PT Plan Current plan remains appropriate    Co-evaluation              AM-PAC PT "6 Clicks" Daily Activity  Outcome Measure  Difficulty turning over in bed (including adjusting bedclothes, sheets and blankets)?: Unable Difficulty moving from lying on back to sitting on the side of the bed? : Unable Difficulty sitting  down on and standing up from a chair with arms (e.g., wheelchair, bedside commode, etc,.)?: Unable Help needed moving to and from a bed to chair (including a wheelchair)?: A Little Help needed walking in hospital room?: A Little Help needed climbing 3-5 steps with a railing? : Total 6 Click Score: 10    End of Session Equipment Utilized During Treatment: Gait belt Activity Tolerance: Patient tolerated treatment well Patient left: in chair;with family/visitor present;with call bell/phone within reach Nurse Communication: Mobility status;Precautions PT Visit Diagnosis: Unsteadiness on feet (R26.81);Other abnormalities of gait and mobility (R26.89);Muscle weakness (generalized) (M62.81)     Time: 0454-0981 PT Time Calculation (min) (ACUTE ONLY): 26 min  Charges:  $Gait Training: 8-22 mins $Therapeutic Activity: 8-22 mins                    G Codes:       Delaney Meigs, PT [phone]    Alexus Galka B Laneta Guerin 08/03/2017, 10:37 AM

## 2017-08-03 NOTE — Progress Notes (Signed)
Pt has not had a bowel movement since pre-op prior to admission. Has been given Movantik once a day as well as senna two times a day. Pt encouraged to drink plenty of fluids and ambulate as much as tolerated.

## 2017-08-03 NOTE — Progress Notes (Signed)
08/02/17 1701  PT Visit Information  Last PT Received On 08/02/17  Assistance Needed +1  PT/OT/SLP Co-Evaluation/Treatment Yes  Reason for Co-Treatment For patient/therapist safety  PT goals addressed during session Mobility/safety with mobility  History of Present Illness Pt is a 32 y/o male who presented to the ED on 4/24 with an episode of LE weakness; MRI and CT scan showed severe spinal stenosis at T9-10 and T10-11 and T11-12; pt is now s/p Decompressive thoracic laminectomy, medial facetectomy T9-10 and T10-11 T11-12 on 08/01/17  Precautions  Precautions Back  Precaution Comments verbally reviewed back precautions  Required Braces or Orthoses  ("no brace needed" per order set )  Restrictions  Weight Bearing Restrictions No  Home Living  Family/patient expects to be discharged to: Private residence  Living Arrangements Spouse/significant other  Available Help at Discharge Family;Available PRN/intermittently  Type of Home House  Home Access Stairs to enter  Entrance Stairs-Number of Steps 1  Entrance Stairs-Rails Right;Left  Home Layout Two level  Alternate Level Stairs-Number of Steps flight   Alternate Level Stairs-Rails Right  Bathroom Shower/Tub Tub/shower unit  Information systems manager  Home Equipment None  Additional Comments other family may be able to assist if 24hr supervision needed   Prior Function  Level of Independence Independent  Communication  Communication No difficulties  Pain Assessment  Pain Assessment Faces  Faces Pain Scale 2  Pain Location back at surgical site   Pain Descriptors / Indicators Operative site guarding  Pain Intervention(s) Monitored during session  Cognition  Arousal/Alertness Awake/alert  Behavior During Therapy [facility] for tasks assessed/performed  Overall Cognitive Status Within Functional Limits for tasks assessed  Upper Extremity Assessment  Upper Extremity Assessment Defer to OT evaluation  Lower Extremity Assessment  Lower  Extremity Assessment Overall WFL for tasks assessed (general proximal weakness ?L worse than right with gait)  Bed Mobility  Overal bed mobility Needs Assistance  Bed Mobility Rolling;Sidelying to Sit  Rolling Min assist  Sidelying to sit Mod assist;+2 for safety/equipment  General bed mobility comments verbal cues for correct log roll technique; assist to guide Pt while rolling to maintain alignment; assist to elevate trunk into sitting  Transfers  Overall transfer level Needs assistance  Equipment used Rolling walker (2 wheeled)  Transfers Sit to/from Stand  Sit to Stand Min assist;+2 safety/equipment  General transfer comment MinA from elevated EOB; VCs for safe hand placement   Ambulation/Gait  Ambulation/Gait assistance Min assist  Ambulation Distance (Feet) 120 Feet  Assistive device Rolling walker (2 wheeled)  Gait Pattern/deviations Step-through pattern  General Gait Details pt with mild unsteadiness, more left LE than right.  Uncoordinated heel/toe pattern and moderately heavy use of the RW  Balance  Overall balance assessment Needs assistance  Sitting-balance support Feet supported;No upper extremity supported  Sitting balance-Leahy Scale Fair  Standing balance support Bilateral upper extremity supported  Standing balance-Leahy Scale Poor  Standing balance comment reliant on UE support during mobility   General Comments  General comments (skin integrity, edema, etc.) Instructed and advised pt/family in back care/prec, log roll, lifting restrictions, progression of activity.  PT - End of Session  Equipment Utilized During Treatment Back brace  Activity Tolerance Patient tolerated treatment well;Other (comment) (fatigue and worsening coordination of gait noted)  Patient left in chair;with call bell/phone within reach;with chair alarm set;with family/visitor present  Nurse Communication Mobility status  PT Assessment  PT Recommendation/Assessment Patient needs continued PT  services  PT Visit Diagnosis Unsteadiness on feet (R26.81);Other abnormalities  of gait and mobility (R26.89);Pain  Pain - part of body  (back)  PT Problem List Decreased strength;Decreased activity tolerance;Decreased mobility;Decreased coordination;Decreased knowledge of use of DME;Decreased knowledge of precautions;Pain  PT Plan  PT Frequency (ACUTE ONLY) Min 5X/week  PT Treatment/Interventions (ACUTE ONLY) DME instruction;Gait training;Stair training;Functional mobility training;Therapeutic activities;Patient/family education  AM-PAC PT "6 Clicks" Daily Activity Outcome Measure  Difficulty turning over in bed (including adjusting bedclothes, sheets and blankets)? 1  Difficulty moving from lying on back to sitting on the side of the bed?  1  Difficulty sitting down on and standing up from a chair with arms (e.g., wheelchair, bedside commode, etc,.)? 1  Help needed moving to and from a bed to chair (including a wheelchair)? 3  Help needed walking in hospital room? 3  Help needed climbing 3-5 steps with a railing?  3  6 Click Score 12  Mobility G Code  CL  PT Recommendation  Recommendations for Other Services Rehab consult  Follow Up Recommendations CIR  PT equipment Other (comment) (TBA next venue)  Individuals Consulted  Consulted and Agree with Results and Recommendations Patient  Acute Rehab PT Goals  Patient Stated Goal hopeful for CIR before home; regain independence   PT Goal Formulation With patient  Time For Goal Achievement 08/09/17  Potential to Achieve Goals Good  PT Time Calculation  PT Start Time (ACUTE ONLY) 1321  PT Stop Time (ACUTE ONLY) 1351  PT Time Calculation (min) (ACUTE ONLY) 30 min  PT General Charges  $$ ACUTE PT VISIT 1 Visit  PT Evaluation  $PT Eval Moderate Complexity 1 Mod  Information compiled by [place] [facility] With late population into note  Delaney Meigs, PT [phone]

## 2017-08-04 MED ORDER — BISACODYL 10 MG RE SUPP
10.0000 mg | Freq: Every day | RECTAL | Status: DC | PRN
  Administered 2017-08-04: 10 mg via RECTAL
  Filled 2017-08-04: qty 1

## 2017-08-04 NOTE — Progress Notes (Signed)
Physical Therapy Treatment Patient Details Name: Scott Lane MRN: [id number] DOB: 03/21/1986 Today's Date: 08/04/2017    History of Present Illness Pt is a 32 y/o male who presented to the ED on 4/24 with an episode of LE weakness with fall after throwing a golf ball; MRI and CT scan showed severe spinal stenosis at T9-10 and T10-11 and T11-12; pt is now s/p Decompressive thoracic laminectomy, medial facetectomy T9-10 and T10-11 T11-12 on 08/01/17. No significant PMHx    PT Comments    Continuing work on functional mobility and activity tolerance;  Walked hallway again today with RW; cues and focus on stance stability and gluteal firing for hip extension and stability in stance; worked on sit<>stand and squat related therex, excellent motivation and participation   Follow Up Recommendations  CIR     Equipment Recommendations  Rolling walker with 5" wheels;3in1 (PT)    Recommendations for Other Services Rehab consult     Precautions / Restrictions Precautions Precautions: Back;Fall Precaution Booklet Issued: Yes (comment)    Mobility  Bed Mobility               General bed mobility comments: in chair on arrival   Transfers Overall transfer level: Needs assistance Equipment used: Rolling walker (2 wheeled) Transfers: Sit to/from Stand Sit to Stand: Min assist         General transfer comment: Min assist to steady pt and RW; noted delayed timing in gluteal firing and hip extension  Ambulation/Gait Ambulation/Gait assistance: Min assist;Min guard Ambulation Distance (Feet): 120 Feet Assistive device: Rolling walker (2 wheeled) Gait Pattern/deviations: Step-through pattern;Decreased stride length;Ataxic     General Gait Details: Dependent on RW for stability; noting ataxic stepping; decr stability in R and L single limb stance; verbal and tactile cues for full hip extension in stance   Stairs             Wheelchair Mobility    Modified Rankin (Stroke  Patients Only)       Balance     Sitting balance-Leahy Scale: Good       Standing balance-Leahy Scale: Poor                              Cognition Arousal/Alertness: Awake/alert Behavior During Therapy: WFL for tasks assessed/performed Overall Cognitive Status: Within Functional Limits for tasks assessed                                        Exercises Other Exercises Other Exercises: serial sit<>stands x5; towel roll between knees Other Exercises: Mini-squats with brief hold; x5 Other Exercises: serial sit<>stands with hold mid-squat, towel roll between knees, x5    General Comments        Pertinent Vitals/Pain Pain Assessment: No/denies pain    Home Living                      Prior Function            PT Goals (current goals can now be found in the care plan section) Acute Rehab PT Goals Patient Stated Goal: hopeful for CIR before home; regain independence  PT Goal Formulation: With patient Time For Goal Achievement: 08/09/17 Potential to Achieve Goals: Good Progress towards PT goals: Progressing toward goals    Frequency    Min 5X/week  PT Plan Current plan remains appropriate    Co-evaluation              AM-PAC PT "6 Clicks" Daily Activity  Outcome Measure  Difficulty turning over in bed (including adjusting bedclothes, sheets and blankets)?: A Lot Difficulty moving from lying on back to sitting on the side of the bed? : Unable Difficulty sitting down on and standing up from a chair with arms (e.g., wheelchair, bedside commode, etc,.)?: Unable Help needed moving to and from a bed to chair (including a wheelchair)?: A Little Help needed walking in hospital room?: A Little Help needed climbing 3-5 steps with a railing? : Total 6 Click Score: 11    End of Session Equipment Utilized During Treatment: Gait belt Activity Tolerance: Patient tolerated treatment well Patient left: in chair;with  family/visitor present;with call bell/phone within reach Nurse Communication: Mobility status;Precautions PT Visit Diagnosis: Unsteadiness on feet (R26.81);Other abnormalities of gait and mobility (R26.89);Muscle weakness (generalized) (M62.81) Pain - part of body: (back)     Time: 1610-9604 PT Time Calculation (min) (ACUTE ONLY): 28 min  Charges:  $Gait Training: 8-22 mins $Therapeutic Exercise: 8-22 mins                    G Codes:       Van Clines, PT  Acute Rehabilitation Services Pager [phone] Office [phone]    Scott Lane 08/04/2017, 1:48 PM

## 2017-08-04 NOTE — Progress Notes (Signed)
Subjective: The patient is alert and pleasant.  He looks well.  His wife is at the bedside.  He wants to go outside.  He complains of constipation.  He denies nausea.  He wants a regular diet.  Objective: Vital signs in last 24 hours: Temp:  [97.8 F (36.6 C)-98.9 F (37.2 C)] 98.3 F (36.8 C) (04/28 0300) Pulse Rate:  [60-91] 66 (04/28 0300) Resp:  [18-20] 20 (04/28 0300) BP: (111-135)/(66-81) 111/66 (04/28 0300) SpO2:  [95 %-100 %] 95 % (04/28 0300) Estimated body mass index is 33.19 kg/m as calculated from the following:   Height as of this encounter: 6' (1.829 m).   Weight as of this encounter: 111 kg (244 lb 11.4 oz).   Intake/Output from previous day: 04/27 0701 - 04/28 0700 In: -  Out: 1800 [Urine:1800] Intake/Output this shift: Total I/O In: -  Out: 200 [Urine:200]  Physical exam patient is alert and oriented.  He is moving his lower extremities well.  Lab Results: No results for input(s): WBC, HGB, HCT, PLT in the last 72 hours. BMET No results for input(s): NA, K, CL, CO2, GLUCOSE, BUN, CREATININE, CALCIUM in the last 72 hours.  Studies/Results: No results found.  Assessment/Plan: Postop day #4: The patient is doing well. I will order a Dulcolax suppository and a regular diet as requested.  It is okay for him to go outside with his wife.  LOS: 4 days     Cristi Loron 08/04/2017, 9:31 AM

## 2017-08-05 ENCOUNTER — Encounter (HOSPITAL_COMMUNITY): Payer: Self-pay

## 2017-08-05 ENCOUNTER — Inpatient Hospital Stay (HOSPITAL_COMMUNITY)
Admission: RE | Admit: 2017-08-05 | Discharge: 2017-08-12 | DRG: 560 | Disposition: A | Discharge status: Home / Self Care | Payer: 59 | Source: Intra-hospital | Attending: Physical Medicine & Rehabilitation | Admitting: Physical Medicine & Rehabilitation

## 2017-08-05 ENCOUNTER — Other Ambulatory Visit: Payer: Self-pay

## 2017-08-05 DIAGNOSIS — M7989 Other specified soft tissue disorders: Secondary | ICD-10-CM | POA: Diagnosis not present

## 2017-08-05 DIAGNOSIS — Z4789 Encounter for other orthopedic aftercare: Secondary | ICD-10-CM | POA: Diagnosis present

## 2017-08-05 DIAGNOSIS — R339 Retention of urine, unspecified: Secondary | ICD-10-CM | POA: Diagnosis not present

## 2017-08-05 DIAGNOSIS — M792 Neuralgia and neuritis, unspecified: Secondary | ICD-10-CM | POA: Diagnosis not present

## 2017-08-05 DIAGNOSIS — E6609 Other obesity due to excess calories: Secondary | ICD-10-CM | POA: Diagnosis not present

## 2017-08-05 DIAGNOSIS — G8918 Other acute postprocedural pain: Secondary | ICD-10-CM | POA: Diagnosis not present

## 2017-08-05 DIAGNOSIS — E669 Obesity, unspecified: Secondary | ICD-10-CM | POA: Diagnosis present

## 2017-08-05 DIAGNOSIS — R030 Elevated blood-pressure reading, without diagnosis of hypertension: Secondary | ICD-10-CM | POA: Diagnosis not present

## 2017-08-05 DIAGNOSIS — K5903 Drug induced constipation: Secondary | ICD-10-CM | POA: Diagnosis not present

## 2017-08-05 DIAGNOSIS — K5900 Constipation, unspecified: Secondary | ICD-10-CM | POA: Diagnosis present

## 2017-08-05 DIAGNOSIS — Z6833 Body mass index (BMI) 33.0-33.9, adult: Secondary | ICD-10-CM

## 2017-08-05 DIAGNOSIS — M62838 Other muscle spasm: Secondary | ICD-10-CM

## 2017-08-05 DIAGNOSIS — D72829 Elevated white blood cell count, unspecified: Secondary | ICD-10-CM | POA: Diagnosis present

## 2017-08-05 DIAGNOSIS — M4714 Other spondylosis with myelopathy, thoracic region: Secondary | ICD-10-CM

## 2017-08-05 DIAGNOSIS — Z8249 Family history of ischemic heart disease and other diseases of the circulatory system: Secondary | ICD-10-CM

## 2017-08-05 DIAGNOSIS — G992 Myelopathy in diseases classified elsewhere: Secondary | ICD-10-CM | POA: Diagnosis not present

## 2017-08-05 DIAGNOSIS — R52 Pain, unspecified: Secondary | ICD-10-CM

## 2017-08-05 DIAGNOSIS — K30 Functional dyspepsia: Secondary | ICD-10-CM | POA: Diagnosis not present

## 2017-08-05 DIAGNOSIS — M25562 Pain in left knee: Secondary | ICD-10-CM | POA: Diagnosis not present

## 2017-08-05 DIAGNOSIS — F411 Generalized anxiety disorder: Secondary | ICD-10-CM

## 2017-08-05 DIAGNOSIS — M4804 Spinal stenosis, thoracic region: Secondary | ICD-10-CM | POA: Diagnosis present

## 2017-08-05 DIAGNOSIS — Z801 Family history of malignant neoplasm of trachea, bronchus and lung: Secondary | ICD-10-CM

## 2017-08-05 MED ORDER — TAMSULOSIN HCL 0.4 MG PO CAPS
0.4000 mg | ORAL_CAPSULE | Freq: Every day | ORAL | Status: DC
  Administered 2017-08-06 – 2017-08-08 (×3): 0.4 mg via ORAL
  Filled 2017-08-05 (×4): qty 1

## 2017-08-05 MED ORDER — ONDANSETRON HCL 4 MG PO TABS
4.0000 mg | ORAL_TABLET | Freq: Four times a day (QID) | ORAL | Status: DC | PRN
  Administered 2017-08-10: 4 mg via ORAL
  Filled 2017-08-05: qty 1

## 2017-08-05 MED ORDER — ONDANSETRON HCL 4 MG/2ML IJ SOLN: 4.0000 mg | Freq: Four times a day (QID) | INTRAMUSCULAR | Status: DC | PRN

## 2017-08-05 MED ORDER — DEXAMETHASONE SODIUM PHOSPHATE 4 MG/ML IJ SOLN
4.0000 mg | Freq: Four times a day (QID) | INTRAMUSCULAR | Status: DC
  Filled 2017-08-05 (×11): qty 1

## 2017-08-05 MED ORDER — SENNA 8.6 MG PO TABS
1.0000 | ORAL_TABLET | Freq: Two times a day (BID) | ORAL | Status: DC
  Administered 2017-08-05 – 2017-08-07 (×4): 8.6 mg via ORAL
  Filled 2017-08-05 (×4): qty 1

## 2017-08-05 MED ORDER — DEXAMETHASONE 2 MG PO TABS
4.0000 mg | ORAL_TABLET | Freq: Four times a day (QID) | ORAL | Status: DC
Start: 2017-08-06 — End: 2017-08-09
  Administered 2017-08-05 – 2017-08-09 (×14): 4 mg via ORAL
  Filled 2017-08-05 (×14): qty 2

## 2017-08-05 MED ORDER — METHOCARBAMOL 1000 MG/10ML IJ SOLN
500.0000 mg | Freq: Four times a day (QID) | INTRAVENOUS | Status: DC | PRN
  Filled 2017-08-05: qty 5

## 2017-08-05 MED ORDER — ZOLPIDEM TARTRATE 5 MG PO TABS
10.0000 mg | ORAL_TABLET | Freq: Once | ORAL | Status: AC
  Administered 2017-08-05: 10 mg via ORAL
  Filled 2017-08-05: qty 2

## 2017-08-05 MED ORDER — SORBITOL 70 % SOLN
30.0000 mL | Freq: Every day | Status: DC | PRN
  Administered 2017-08-06 – 2017-08-10 (×2): 30 mL via ORAL
  Filled 2017-08-05: qty 30

## 2017-08-05 MED ORDER — BISACODYL 10 MG RE SUPP
10.0000 mg | Freq: Every day | RECTAL | Status: DC | PRN
Start: 2017-08-05 — End: 2017-08-12
  Administered 2017-08-07 – 2017-08-11 (×2): 10 mg via RECTAL
  Filled 2017-08-05 (×2): qty 1

## 2017-08-05 MED ORDER — ACETAMINOPHEN 325 MG PO TABS: 650.0000 mg | ORAL_TABLET | ORAL | Status: DC | PRN

## 2017-08-05 MED ORDER — NALOXEGOL OXALATE 25 MG PO TABS
25.0000 mg | ORAL_TABLET | Freq: Every day | ORAL | Status: DC
  Administered 2017-08-06 – 2017-08-12 (×7): 25 mg via ORAL
  Filled 2017-08-05 (×7): qty 1

## 2017-08-05 MED ORDER — ACETAMINOPHEN 650 MG RE SUPP: 650.0000 mg | RECTAL | Status: DC | PRN

## 2017-08-05 MED ORDER — METHOCARBAMOL 500 MG PO TABS
500.0000 mg | ORAL_TABLET | Freq: Four times a day (QID) | ORAL | Status: DC | PRN
Start: 2017-08-05 — End: 2017-08-12
  Administered 2017-08-06: 500 mg via ORAL
  Filled 2017-08-05 (×2): qty 1

## 2017-08-05 MED ORDER — CELECOXIB 200 MG PO CAPS
200.0000 mg | ORAL_CAPSULE | Freq: Two times a day (BID) | ORAL | Status: DC
  Administered 2017-08-05 – 2017-08-12 (×14): 200 mg via ORAL
  Filled 2017-08-05 (×14): qty 1

## 2017-08-05 MED ORDER — HYDROCODONE-ACETAMINOPHEN 7.5-325 MG PO TABS
1.0000 | ORAL_TABLET | Freq: Four times a day (QID) | ORAL | Status: DC
  Administered 2017-08-06 – 2017-08-08 (×10): 1 via ORAL
  Filled 2017-08-05 (×11): qty 1

## 2017-08-05 NOTE — PMR Pre-admission (Addendum)
PMR Admission Coordinator Pre-Admission Assessment  Patient: Scott Lane is an 32 y.o., male MRN: [id number] DOB: [birth year] Height: 6' (182.9 cm) Weight: 111 kg (244 lb 11.4 oz)              Insurance Information HMO:     PPO: X     PCP:      IPA:      80/20:      OTHER:  PRIMARY: Phineas Real      Policy#: [id number]      Subscriber: Spouse CM Name: case manager      Phone#: [phone]     Fax#: [phone] Pre-Cert#: [id number]      Employer: Full Time Benefits:  Phone #: verified Kathi Ludwig     Name: [employer] Online Portal  Eff. Date: 04/09/17     Deduct: $2800      Out of Pocket Max: $8000      Life Max: N/A CIR: 80%/20%      SNF: 80%/20% Outpatient: 80%     Co-Pay: 20% Home Health: 80%      Co-Pay: 20% DME: 80%     Co-Pay: 20% Providers: In-network   SECONDARY: None        Emergency Contact Information Contact Information    Name Relation Home Work [place], [place] Spouse   [phone]     Current Medical History  Patient Admitting Diagnosis: Thoracic stenosis with myelopathy   History of Present Illness: Mr. Kelten Enochs. Kos is a 32 year old right-handed male with unremarkable past medical history on no prescription medications.  Per chart review patient lives with spouse.  Independent prior to admission.  2 level home.  Spouse works during the day.  Presented 07/31/2017 with persistent lower extremity weakness and numbness after throwing a golf ball when he felt an electric shock down his legs that persisted over 2-1/2 weeks.  MRI and imaging revealed severe spinal stenosis at T9-12 with cord edema.  Underwent decompressive thoracic laminectomy, medial facetectomy T9-10 and T10-11, T11-12 08/01/2017 per Dr. Marikay Alar.  Hospital course pain management.  No back brace required with routine back precautions directed.  Decadron protocol as advised.  Bouts of constipation with laxative assistance as needed.  Patient was some difficulty with urination placed on low-dose  Flomax.  Physical and occupational therapy evaluations completed with recommendations of physical medicine rehab consult.  Patient was admitted for a comprehensive rehab program 08/05/17.        Past Medical History  Past Medical History:  Diagnosis Date  . Chicken pox     Family History  family history includes Hypertension in his brother, father, maternal grandmother, mother, and paternal grandmother; Lung cancer in his father and maternal grandmother.  Prior Rehab/Hospitalizations:  Has the patient had major surgery during 100 days prior to admission? No  Current Medications   Current Facility-Administered Medications:  .  0.9 %  sodium chloride infusion, 250 mL, Intravenous, Continuous, Tia Alert, MD, Last Rate: 1 mL/hr at 08/02/17 0234, 250 mL at 08/02/17 0234 .  acetaminophen (TYLENOL) tablet 650 mg, 650 mg, Oral, Q4H PRN, 650 mg at 08/02/17 0019 **OR** acetaminophen (TYLENOL) suppository 650 mg, 650 mg, Rectal, Q4H PRN, Tia Alert, MD .  bisacodyl (DULCOLAX) suppository 10 mg, 10 mg, Rectal, Daily PRN, Tressie Stalker, MD, 10 mg at 08/04/17 1057 .  celecoxib (CELEBREX) capsule 200 mg, 200 mg, Oral, Q12H, Tia Alert, MD, 200 mg at 08/05/17 0916 .  dexamethasone (DECADRON) injection 4 mg,  4 mg, Intravenous, Q6H **OR** dexamethasone (DECADRON) tablet 4 mg, 4 mg, Oral, Q6H, Tia Alert, MD, 4 mg at 08/05/17 1110 .  HYDROcodone-acetaminophen (NORCO) 7.5-325 MG per tablet 1 tablet, 1 tablet, Oral, Q6H, Tia Alert, MD, 1 tablet at 08/05/17 1110 .  HYDROmorphone (DILAUDID) injection 0.25-0.5 mg, 0.25-0.5 mg, Intravenous, Q5 min PRN, Heather Roberts, MD .  HYDROmorphone (DILAUDID) injection 0.5 mg, 0.5 mg, Intravenous, Q2H PRN, Tia Alert, MD, 0.5 mg at 08/02/17 1524 .  menthol-cetylpyridinium (CEPACOL) lozenge 3 mg, 1 lozenge, Oral, PRN **OR** phenol (CHLORASEPTIC) mouth spray 1 spray, 1 spray, Mouth/Throat, PRN, Tia Alert, MD .  methocarbamol (ROBAXIN) tablet  500 mg, 500 mg, Oral, Q6H PRN, 500 mg at 08/03/17 1051 **OR** methocarbamol (ROBAXIN) 500 mg in dextrose 5 % 50 mL IVPB, 500 mg, Intravenous, Q6H PRN, Tia Alert, MD .  naloxegol oxalate (MOVANTIK) tablet 25 mg, 25 mg, Oral, Daily, Tia Alert, MD, 25 mg at 08/05/17 0916 .  ondansetron (ZOFRAN) tablet 4 mg, 4 mg, Oral, Q6H PRN **OR** ondansetron (ZOFRAN) injection 4 mg, 4 mg, Intravenous, Q6H PRN, Tia Alert, MD, 4 mg at 08/03/17 1210 .  promethazine (PHENERGAN) injection 6.25-12.5 mg, 6.25-12.5 mg, Intravenous, Q15 min PRN, Heather Roberts, MD .  senna [facility]) tablet 8.6 mg, 1 tablet, Oral, BID, Tia Alert, MD, 8.6 mg at 08/05/17 0915 .  sodium chloride flush (NS) 0.9 % injection 3 mL, 3 mL, Intravenous, Q12H, Tia Alert, MD, 3 mL at 08/05/17 0915 .  sodium chloride flush (NS) 0.9 % injection 3 mL, 3 mL, Intravenous, PRN, Tia Alert, MD .  tamsulosin [facility]) capsule 0.4 mg, 0.4 mg, Oral, QPC breakfast, Tia Alert, MD, 0.4 mg at 08/05/17 1610 .  zolpidem (AMBIEN) tablet 10 mg, 10 mg, Oral, QHS PRN, Tressie Stalker, MD, 10 mg at 08/04/17 2155  Patients Current Diet: Precautions:  No bending, arching, or twisting Diet regular Room service appropriate? Yes; Fluid consistency: Thin  Precautions / Restrictions Precautions Precautions: Back, Fall Precaution Booklet Issued: Yes (comment) Precaution Comments: educated for all precautions with handout provided Restrictions Weight Bearing Restrictions: No   Has the patient had 2 or more falls or a fall with injury in the past year?Yes, the 1 fall just prior to admission that lead to this admission   Prior Activity Level Community (5-7x/wk): Prior to admission patient was fully independent and active.  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    Home Assistive Devices / Equipment Home Assistive Devices/Equipment: None Home Equipment: None  Prior Device Use: Indicate devices/aids used by the patient  prior to current illness, exacerbation or injury? None of the above  Prior Functional Level Prior Function Level of Independence: Independent  Self Care: Did the patient need help bathing, dressing, using the toilet or eating? Independent  Indoor Mobility: Did the patient need assistance with walking from room to room (with or without device)? Independent  Stairs: Did the patient need assistance with internal or external stairs (with or without device)? Independent  Functional Cognition: Did the patient need help planning regular tasks such as shopping or remembering to take medications? Independent  Current Functional Level Cognition  Overall Cognitive Status: Within Functional Limits for tasks assessed Orientation Level: Oriented X4    Extremity Assessment (includes Sensation/Coordination)  Upper Extremity Assessment: Defer to OT evaluation  Lower Extremity Assessment: Overall WFL for tasks assessed(general proximal weakness ?L worse than right with gait)    ADLs  Overall ADL's : Needs assistance/impaired Eating/Feeding: Modified independent, Sitting Grooming: Min guard, Minimal assistance, Standing Upper Body Bathing: Min guard, Sitting Lower Body Bathing: Moderate assistance, Sit to/from stand Upper Body Dressing : Min guard, Sitting Lower Body Dressing: Maximal assistance, Sit to/from stand Toilet Transfer: Minimal assistance, Ambulation, BSC, RW Toilet Transfer Details (indicate cue type and reason): BSC over toilet; simulated in transfer to recliner  Toileting- Clothing Manipulation and Hygiene: Minimal assistance, Sit to/from stand Functional mobility during ADLs: Minimal assistance, Min guard, Rolling walker General ADL Comments: pt completing room and hallway functional mobility using RW with MinGuard-MinA (+2 for safety this session) educated on adherence to back precautions while completing LB ADLs, will need to practice/review in additional tx sessions      Mobility  Overal bed mobility: Needs Assistance Bed Mobility: Rolling, Sidelying to Sit Rolling: Min assist Sidelying to sit: Mod assist, +2 for safety/equipment General bed mobility comments: Pt was OOB in the recliner chair.    Transfers  Overall transfer level: Needs assistance Equipment used: Rolling walker (2 wheeled) Transfers: Sit to/from Stand Sit to Stand: Supervision General transfer comment: supervision for safety due to heavy reliance on arms for transition to stand.     Ambulation / Gait / Stairs / Wheelchair Mobility  Ambulation/Gait Ambulation/Gait assistance: Architect (Feet): 150 Feet Assistive device: Rolling walker (2 wheeled) Gait Pattern/deviations: Decreased dorsiflexion - left, Decreased stride length, Steppage General Gait Details: Pt started as supervision with gait, and progressed, with fatiuge to min assist as after we walked and practices stairs, bil LEs started snapping back into hyperextension during stance.  Min assist at trunk to ensure he safely made it back to his room before his legs gave completely out on him.  Gait velocity: decreased Gait velocity interpretation: <1.31 ft/sec, indicative of household ambulator Stairs: Yes Stairs assistance: Mod assist Stair Management: One rail Right, Step to pattern, Forwards(one rail and one hand) Number of Stairs: 4(I was fearful of fatigue and him not being able to get down) General stair comments: Pt was able to make it up 4 steps with step to pattern, leading up with his right and down with his left weaker leg.  Pt needs more assist descending the stairs because of poor eccentric control.     Posture / Balance Balance Overall balance assessment: Needs assistance Sitting-balance support: Feet supported, No upper extremity supported Sitting balance-Leahy Scale: Good Standing balance support: Bilateral upper extremity supported, No upper extremity supported, Single extremity  supported Standing balance-Leahy Scale: Fair Standing balance comment: reliant on UE support during mobility     Special needs/care consideration BiPAP/CPAP: No CPM: No Continuous Drip IV: No Dialysis: No         Life Vest: No Oxygen: No Special Bed: No Trach Size: No Wound Vac (area): No       Skin: Dry, monitoring back surgical incision                                Bowel mgmt: Continent, last BM 08/04/17 with now some constipation  Bladder mgmt: Continent  Diabetic mgmt: No     Previous Home Environment Living Arrangements: Spouse/significant other Available Help at Discharge: Family, Available PRN/intermittently Type of Home: House Home Layout: Two level Alternate Level Stairs-Rails: Right Alternate Level Stairs-Number of Steps: flight  Home Access: Stairs to enter Entrance Stairs-Rails: Right, Left Entrance Stairs-Number of Steps: 1 Bathroom Shower/Tub: Engineer, manufacturing systems: Standard  Additional Comments: other family may be able to assist if 24hr supervision needed   Discharge Living Setting Plans for Discharge Living Setting: Patient's home, Lives with (comment)(Spouse and child ) Type of Home at Discharge: House Discharge Home Layout: Two level, 1/2 bath on main level Alternate Level Stairs-Rails: Right Alternate Level Stairs-Number of Steps: 12 steps  Discharge Home Access: Stairs to enter Entrance Stairs-Rails: None Entrance Stairs-Number of Steps: 1 Discharge Bathroom Shower/Tub: Tub/shower unit, Curtain Discharge Bathroom Toilet: Standard Discharge Bathroom Accessibility: Yes How Accessible: Accessible via walker Does the patient have any problems obtaining your medications?: No  Social/Family/Support Systems Patient Roles: Spouse, Parent Contact Information: Spouse: Michelle Nasuti  Anticipated Caregiver: Spouse intermittent, Mod I goals  Anticipated Caregiver's Contact Information: Cell: [phone] Ability/Limitations of Caregiver:  Spouse works, but can work remotely from home some Caregiver Availability: Intermittent Discharge Plan Discussed with Primary Caregiver: Yes Is Caregiver In Agreement with Plan?: Yes Does Caregiver/Family have Issues with Lodging/Transportation while Pt is in Rehab?: No  Goals/Additional Needs Patient/Family Goal for Rehab: PT/OT: Mod I Expected length of stay: 5-7 days  Cultural Considerations: None Dietary Needs: Regular textures and thin liquids  Equipment Needs: TBD Pt/Family Agrees to Admission and willing to participate: Yes Program Orientation Provided & Reviewed with Pt/Caregiver Including Roles  & Responsibilities: Yes  Barriers to Discharge: Home environment access/layout(needs to be able to access 2nd floor rooms and bathroom )  Decrease burden of Care through IP rehab admission: No  Possible need for SNF placement upon discharge: No  Patient Condition: This patient's medical and functional status has changed since the consult dated: 08/03/17 at 10:53 am in which the Rehabilitation Physician determined and documented that the patient's condition is appropriate for intensive rehabilitative care in an inpatient rehabilitation facility. See "History of Present Illness" (above) for medical update. Functional changes are: Min A gait for 150 feet and Mod A stairs. Patient's medical and functional status update has been discussed with the Rehabilitation physician and patient remains appropriate for inpatient rehabilitation. Will admit to inpatient rehab today.  Preadmission Screen Completed By:  Fae Pippin, 08/05/2017 4:26 PM ______________________________________________________________________   Discussed status with Dr. Allena Katz on 08/05/17 at 1630 and received telephone approval for admission today.  Admission Coordinator:  Fae Pippin, time 1630/Date 08/05/17

## 2017-08-05 NOTE — IPOC Note (Signed)
Overall Plan of Care (IPOC) Patient Details Name: Scott Lane MRN: [id number] DOB: 03/21/1986  Admitting Diagnosis: Thoracic myelopathy s/p decompression  Hospital Problems: Active Problems:   Thoracic myelopathy   Neuropathic pain   Muscle spasm   Drug induced constipation   Urinary retention   Class 1 obesity due to excess calories with serious comorbidity and body mass index (BMI) of 33.0 to 33.9 in adult   Elevated blood pressure reading     Functional Problem List: Nursing Sensory, Skin Integrity, Edema, Medication Management, Endurance, Pain, Motor  PT Balance, Motor, Pain, Sensory  OT Balance, Motor, Pain  SLP    TR         Basic ADL's: OT Grooming, Bathing, Dressing, Toileting     Advanced  ADL's: OT Simple Meal Preparation     Transfers: PT Bed Mobility, Bed to Chair, Car, Lobbyist, Technical brewer: PT Ambulation, Stairs     Additional Impairments: OT    SLP        TR      Anticipated Outcomes Item Anticipated Outcome  Self Feeding independent  Swallowing      Basic self-care  modified independent  Toileting  modified independent   Bathroom Transfers modified independent  Bowel/Bladder  min assist  Transfers  mod I overall  Locomotion  mod I gait with LRAD; supervision stairs  Communication     Cognition     Pain  less<2  Safety/Judgment  min assist   Therapy Plan: PT Intensity: Minimum of 1-2 x/day ,45 to 90 minutes PT Frequency: 5 out of 7 days PT Duration Estimated Length of Stay: 5-9 days OT Intensity: Minimum of 1-2 x/day, 45 to 90 minutes OT Frequency: 5 out of 7 days OT Duration/Estimated Length of Stay: 7-9 days      Team Interventions: Nursing Interventions Patient/Family Education, Pain Management, Medication Management, Discharge Planning, Skin Care/Wound Management, Psychosocial Support, Disease Management/Prevention  PT interventions Ambulation/gait training, Warden/ranger,  Community reintegration, Disease management/prevention, Discharge planning, DME/adaptive equipment instruction, Functional electrical stimulation, Functional mobility training, Neuromuscular re-education, Patient/family education, Pain management, Psychosocial support, Skin care/wound management, Splinting/orthotics, Stair training, Therapeutic Activities, Therapeutic Exercise, UE/LE Coordination activities, UE/LE Strength taining/ROM, Wheelchair propulsion/positioning  OT Interventions Warden/ranger, Community reintegration, Discharge planning, DME/adaptive equipment instruction, Neuromuscular re-education, Functional mobility training, Pain management, Self Care/advanced ADL retraining, Therapeutic Activities, Therapeutic Exercise, Patient/family education, UE/LE Strength taining/ROM  SLP Interventions    TR Interventions    SW/CM Interventions Discharge Planning, Psychosocial Support, Patient/Family Education   Barriers to Discharge MD  Medical stability  Nursing      PT      OT Home environment access/layout Pt has not bedrooms on the first floor.  SLP      SW       Team Discharge Planning: Destination: PT-Home ,OT- Home , SLP-  Projected Follow-up: PT-Outpatient PT, OT-  Home health OT, SLP-  Projected Equipment Needs: PT-To be determined(may need RW), OT- To be determined, SLP-  Equipment Details: PT- , OT-  Patient/family involved in discharge planning: PT- Patient,  OT-Patient, SLP-   MD ELOS: 5-9 days. Medical Rehab Prognosis:  Good Assessment: 32 year old right-handed male with unremarkable past medical history on no prescription medications. Presented 07/31/2017 with persistent lower extremity weakness and numbness after throwing a golf ball when he felt an electric shock down his legs that persisted over 2-1/2 weeks.  MRI reviewed, showing thoracic spinal stenosis.  Per report, severe spinal stenosis at  T9-12 with cord edema.  Underwent decompressive thoracic  laminectomy, medial facetectomy T9-10 and T10-11, T11-12 08/01/2017 per Dr. Marikay Alar.  Hospital course pain management.  No back brace required with routine back precautions directed.  Decadron protocol as advised.  Bouts of constipation with laxative assistance as needed.  Patient was some difficulty with urination placed on low-dose Flomax.  Patient with resulting functional deficits with mobility, transfers, and self-care.  Will set goals for Mod I with PT/OT.  See Team Conference Notes for weekly updates to the plan of care

## 2017-08-05 NOTE — Plan of Care (Signed)
Patient arrived on rehab unit today, he is stable ,no complains of pain,continent of bladder and bowel.

## 2017-08-05 NOTE — Progress Notes (Signed)
Inpatient Rehabilitation  Met with patient at bedside and spoke with spouse via phone to discuss team's recommendation for IP Rehab.  Shared booklets, insurance verification letter, and answered initial questions.  Have initiated insurance authorization and await Andre Lefort decision and have approval to proceed with admission today.  Updated team; call if questions.    Carmelia Roller., CCC/SLP Admission Coordinator  [place]  Cell [phone]

## 2017-08-05 NOTE — H&P (Signed)
Physical Medicine and Rehabilitation Admission H&P    Chief Complaint  Patient presents with  . Fall  . Leg Injury  : HPI: Mr. Scott Lane. Null is a 32 year old right-handed male with unremarkable past medical history on no prescription medications.  Per chart review and patient, patient lives with spouse.  Independent prior to admission.  2 level home.  Spouse works during the day.  Presented 07/31/2017 with persistent lower extremity weakness and numbness after throwing a golf ball when he felt an electric shock down his legs that persisted over 2-1/2 weeks.  MRI reviewed, showing thoracic spinal stenosis.  Per report, severe spinal stenosis at T9-12 with cord edema.  Underwent decompressive thoracic laminectomy, medial facetectomy T9-10 and T10-11, T11-12 08/01/2017 per Dr. Marikay Alar.  Hospital course pain management.  No back brace required with routine back precautions directed.  Decadron protocol as advised.  Bouts of constipation with laxative assistance as needed.  Patient was some difficulty with urination placed on low-dose Flomax.  Physical and occupational therapy evaluations completed with recommendations of physical medicine rehab consult.  Patient was admitted for a comprehensive rehab program  Review of Systems  Constitutional: Negative for fever.  HENT: Negative for hearing loss.   Eyes: Negative for blurred vision and double vision.  Respiratory: Negative for shortness of breath.   Cardiovascular: Negative for leg swelling.  Gastrointestinal: Positive for constipation and nausea.  Genitourinary: Negative for dysuria, flank pain and hematuria.  Musculoskeletal: Positive for back pain.  Neurological: Positive for sensory change and focal weakness.  All other systems reviewed and are negative.  Past Medical History:  Diagnosis Date  . Chicken pox    Past Surgical History:  Procedure Laterality Date  . LUMBAR LAMINECTOMY/DECOMPRESSION MICRODISCECTOMY N/A 08/01/2017   Procedure: Thoracic nine, ten, eleven, twelve laminectomy;  Surgeon: Tia Alert, MD;  Location: [facility] OR;  Service: Neurosurgery;  Laterality: N/A;  . none     Family History  Problem Relation Age of Onset  . Hypertension Mother   . Hypertension Father   . Lung cancer Father        smoker  . Hypertension Brother   . Hypertension Maternal Grandmother   . Lung cancer Maternal Grandmother        non smoker  . Hypertension Paternal Grandmother    Social History:  reports that he has never smoked. He has never used smokeless tobacco. He reports that he drinks about 1.2 oz of alcohol per week. He reports that he does not use drugs. Allergies: No Known Allergies Medications Prior to Admission  Medication Sig Dispense Refill  . ibuprofen (ADVIL,MOTRIN) 200 MG tablet Take 200 mg by mouth 3 (three) times daily.    . multivitamin (ONE-A-DAY MEN'S) TABS tablet Take 1 tablet by mouth daily.      Drug Regimen Review Drug regimen was reviewed and remains appropriate with no significant issues identified  Home: Home Living Family/patient expects to be discharged to:: Private residence Living Arrangements: Spouse/significant other Available Help at Discharge: Family, Available PRN/intermittently Type of Home: House Home Access: Stairs to enter Secretary/administrator of Steps: 1 Entrance Stairs-Rails: Right, Left Home Layout: Two level Alternate Level Stairs-Number of Steps: flight  Alternate Level Stairs-Rails: Right Bathroom Shower/Tub: Engineer, manufacturing systems: Standard Home Equipment: None Additional Comments: other family may be able to assist if 24hr supervision needed    Functional History: Prior Function Level of Independence: Independent  Functional Status:  Mobility: Bed Mobility Overal bed mobility: Needs Assistance  Bed Mobility: Rolling, Sidelying to Sit Rolling: Min assist Sidelying to sit: Mod assist, +2 for safety/equipment General bed mobility comments: in  chair on arrival  Transfers Overall transfer level: Needs assistance Equipment used: Rolling walker (2 wheeled) Transfers: Sit to/from Stand Sit to Stand: Min assist General transfer comment: Min assist to steady pt and RW; noted delayed timing in gluteal firing and hip extension Ambulation/Gait Ambulation/Gait assistance: Min assist, Min guard Ambulation Distance (Feet): 120 Feet Assistive device: Rolling walker (2 wheeled) Gait Pattern/deviations: Step-through pattern, Decreased stride length, Ataxic General Gait Details: Dependent on RW for stability; noting ataxic stepping; decr stability in R and L single limb stance; verbal and tactile cues for full hip extension in stance Gait velocity interpretation: <1.31 ft/sec, indicative of household ambulator Stairs: Yes General stair comments: pt attempted to ascend stairs with rail and HHA but unable to power up with RLE. Attempted backward pushing on therapist shoulder but again pt without strength to ascend step    ADL: ADL Overall ADL's : Needs assistance/impaired Eating/Feeding: Modified independent, Sitting Grooming: Min guard, Minimal assistance, Standing Upper Body Bathing: Min guard, Sitting Lower Body Bathing: Moderate assistance, Sit to/from stand Upper Body Dressing : Min guard, Sitting Lower Body Dressing: Maximal assistance, Sit to/from stand Toilet Transfer: Minimal assistance, Ambulation, BSC, RW Toilet Transfer Details (indicate cue type and reason): BSC over toilet; simulated in transfer to recliner  Toileting- Clothing Manipulation and Hygiene: Minimal assistance, Sit to/from stand Functional mobility during ADLs: Minimal assistance, Min guard, Rolling walker General ADL Comments: pt completing room and hallway functional mobility using RW with MinGuard-MinA (+2 for safety this session) educated on adherence to back precautions while completing LB ADLs, will need to practice/review in additional tx sessions    Cognition: Cognition Overall Cognitive Status: Within Functional Limits for tasks assessed Orientation Level: Oriented X4 Cognition Arousal/Alertness: Awake/alert Behavior During Therapy: WFL for tasks assessed/performed Overall Cognitive Status: Within Functional Limits for tasks assessed  Physical Exam: Blood pressure (!) 145/72, pulse 62, temperature 98.2 F (36.8 C), resp. rate 16, height 6' (1.829 m), weight 111 kg (244 lb 11.4 oz), SpO2 99 %. Physical Exam  Vitals reviewed. Constitutional: He appears well-developed.  32 year old right-handed obese male sitting up in chair no acute distress.  HENT:  Head: Normocephalic and atraumatic.  Eyes: EOM are normal. Right eye exhibits no discharge. Left eye exhibits no discharge.  Neck: Normal range of motion. Neck supple. No thyromegaly present.  Cardiovascular: Normal rate, regular rhythm and normal heart sounds.  Respiratory: Effort normal and breath sounds normal. No respiratory distress.  GI: Soft. Bowel sounds are normal. He exhibits no distension.  Musc: No edema or tenderness in extremities Neurological:  Neurological.  Patient is alert oriented x3.   Follows full commands.  Motor: B/l UE 5/5 proximal to distal RLE: 4+-5/5 proximal to distal LLE: 4-4+/5 proximal to distal Skin.  Back incision clean and dry.   Medical Problem List and Plan: 1.  Sudden onset of leg paralysis secondary to thoracic stenosis with myelopathy.  Status post decompressive thoracic laminectomy T9-10 and T10-11, T11-12.  No back brace required 2.  DVT Prophylaxis/Anticoagulation: SCDs.  Check vascular study 3. Pain Management: Celebrex 200 mg every 12 hours, hydrocodone and Robaxin as needed 4. Mood: Provide emotional support 5. Neuropsych: This patient is capable of making decisions on his own behalf. 6. Skin/Wound Care: Routine skin checks 7. Fluids/Electrolytes/Nutrition: Routine I/O's with follow-up chemistries 8.  Constipation.  Laxative  assistance 9.  Urinary retention.  Flomax  0.4 mg daily.  Check PVR x3 10.  Obesity.  BMI 33.19.  Dietary follow-up   Post Admission Physician Evaluation: 1. Preadmission assessment reviewed and changes made below. 2. Functional deficits secondary  to thoracic myelopathy s/p decompression. 3. Patient is admitted to receive collaborative, interdisciplinary care between the physiatrist, rehab nursing staff, and therapy team. 4. Patient's level of medical complexity and substantial therapy needs in context of that medical necessity cannot be provided at a lesser intensity of care such as a SNF. 5. Patient has experienced substantial functional loss from his/her baseline which was documented above under the "Functional History" and "Functional Status" headings.  Judging by the patient's diagnosis, physical exam, and functional history, the patient has potential for functional progress which will result in measurable gains while on inpatient rehab.  These gains will be of substantial and practical use upon discharge  in facilitating mobility and self-care at the household level. 6. Physiatrist will provide 24 hour management of medical needs as well as oversight of the therapy plan/treatment and provide guidance as appropriate regarding the interaction of the two. 7. 24 hour rehab nursing will assist with bladder management, safety, skin/wound care, disease management, pain management and patient education  and help integrate therapy concepts, techniques,education, etc. 8. PT will assess and treat for/with: Lower extremity strength, range of motion, stamina, balance, functional mobility, safety, adaptive techniques and equipment, wound care, coping skills, pain control, education. Goals are: Mod I. 9. OT will assess and treat for/with: ADL's, functional mobility, safety, upper extremity strength, adaptive techniques and equipment, wound mgt, ego support, and community reintegration.   Goals are: Mod I.  Therapy may proceed with showering this patient. 10. Case Management and Social Worker will assess and treat for psychological issues and discharge planning. 11. Team conference will be held weekly to assess progress toward goals and to determine barriers to discharge. 12. Patient will receive at least 3 hours of therapy per day at least 5 days per week. 13. ELOS: 6-10 days.        14. Prognosis:  excellent and good  I have personally performed a face to face diagnostic evaluation, including, but not limited to relevant history and physical exam findings, of this patient and developed relevant assessment and plan.  Additionally, I have reviewed and concur with the physician assistant's documentation above.   Maryla Morrow, MD, ABPMR  Scott Rossetti Angiulli, PA-C 08/05/2017

## 2017-08-05 NOTE — Progress Notes (Signed)
Patient ID: Scott Lane, male   DOB: 01/09/1986, 32 y.o.   MRN: [id number] Subjective: Patient reports no pain,. Exercising in room, tingling better, walking better  Objective: Vital signs in last 24 hours: Temp:  [97.7 F (36.5 C)-99.2 F (37.3 C)] 97.7 F (36.5 C) (04/29 0518) Pulse Rate:  [60-84] 60 (04/29 0518) Resp:  [16-20] 16 (04/29 0518) BP: (110-135)/(78-85) 110/79 (04/29 0518) SpO2:  [95 %-98 %] 98 % (04/29 0518)  Intake/Output from previous day: 04/28 0701 - 04/29 0700 In: 1276.4 [P.O.:1200; I.V.:76.4] Out: 1250 [Urine:1250] Intake/Output this shift: No intake/output data recorded.  Neurologic: Grossly normal UE, spastic with increased tone in B LE, still some DF weakness on L  Lab Results: Lab Results  Component Value Date   WBC 9.3 07/31/2017   HGB 15.4 07/31/2017   HCT 44.8 07/31/2017   MCV 85.7 07/31/2017   PLT 214 07/31/2017   No results found for: INR, PROTIME BMET Lab Results  Component Value Date   NA 138 07/31/2017   K 3.8 07/31/2017   CL 104 07/31/2017   CO2 27 07/31/2017   GLUCOSE 89 07/31/2017   BUN 14 07/31/2017   CREATININE 1.00 07/31/2017   CALCIUM 9.3 07/31/2017    Studies/Results: No results found.  Assessment/Plan: Doing well, awaiting rehab where I think he'll progress quickly  Estimated body mass index is 33.19 kg/m as calculated from the following:   Height as of this encounter: 6' (1.829 m).   Weight as of this encounter: 111 kg (244 lb 11.4 oz).    LOS: 5 days    Ritta Hammes S 08/05/2017, 8:31 AM

## 2017-08-05 NOTE — Progress Notes (Deleted)
Rehab Admissions Coordinator Note:  Patient was screened by Clois Dupes for appropriateness for an Inpatient Acute Rehab Consult per PT and OT recommendation.   At this time, we are recommending Inpatient Rehab consult if pt would like to be considered for admit.  Clois Dupes 08/05/2017, 8:28 AM  I can be reached at [phone].

## 2017-08-05 NOTE — Progress Notes (Signed)
Physical Therapy Treatment Patient Details Name: Scott Lane MRN: [id number] DOB: [birth year] Today's Date: 08/05/2017    History of Present Illness Pt is a 32 y/o male who presented to the ED on 4/24 with an episode of LE weakness with fall after throwing a golf ball; MRI and CT scan showed severe spinal stenosis at T9-10 and T10-11 and T11-12; pt is now s/p Decompressive thoracic laminectomy, medial facetectomy T9-10 and T10-11 T11-12 on 08/01/17. No significant PMHx    PT Comments    Even though pt is making good gains with his mobility, he continues to fatigue quickly with gait and stair training.  I was fearful we would not make it back to his room after walking, doing 4 stairs and walking back with RW to his room.  He has a full flight at home to access his bedroom and at this point would be very scary even with assist mobilizing at home due to how quickly he fatigues and his legs start to show signs of buckling and instability.  I continue to strongly endorse CIR level therapies to get him that much stronger before going home with his wife's assistance.    Follow Up Recommendations  CIR     Equipment Recommendations  Rolling walker with 5" wheels;3in1 (PT)    Recommendations for Other Services Rehab consult     Precautions / Restrictions Precautions Precautions: Back;Fall    Mobility  Bed Mobility               General bed mobility comments: Pt was OOB in the recliner chair.  Transfers Overall transfer level: Needs assistance Equipment used: Rolling walker (2 wheeled) Transfers: Sit to/from Stand Sit to Stand: Supervision         General transfer comment: supervision for safety due to heavy reliance on arms for transition to stand.   Ambulation/Gait Ambulation/Gait assistance: Min assist Ambulation Distance (Feet): 150 Feet Assistive device: Rolling walker (2 wheeled) Gait Pattern/deviations: Decreased dorsiflexion - left;Decreased stride  length;Steppage Gait velocity: decreased   General Gait Details: Pt started as supervision with gait, and progressed, with fatiuge to min assist as after we walked and practices stairs, bil LEs started snapping back into hyperextension during stance.  Min assist at trunk to ensure he safely made it back to his room before his legs gave completely out on him.    Stairs Stairs: Yes Stairs assistance: Mod assist Stair Management: One rail Right;Step to pattern;Forwards(one rail and one hand) Number of Stairs: 4(I was fearful of fatigue and him not being able to get down) General stair comments: Pt was able to make it up 4 steps with step to pattern, leading up with his right and down with his left weaker leg.  Pt needs more assist descending the stairs because of poor eccentric control.           Balance Overall balance assessment: Needs assistance Sitting-balance support: Feet supported;No upper extremity supported Sitting balance-Leahy Scale: Good     Standing balance support: Bilateral upper extremity supported;No upper extremity supported;Single extremity supported Standing balance-Leahy Scale: Fair                              Cognition Arousal/Alertness: Awake/alert Behavior During Therapy: WFL for tasks assessed/performed Overall Cognitive Status: Within Functional Limits for tasks assessed  Pertinent Vitals/Pain Pain Assessment: No/denies pain           PT Goals (current goals can now be found in the care plan section) Acute Rehab PT Goals Patient Stated Goal: hopeful for CIR before home; regain independence, walk normally Progress towards PT goals: Progressing toward goals    Frequency    Min 5X/week      PT Plan Current plan remains appropriate       AM-PAC PT "6 Clicks" Daily Activity  Outcome Measure  Difficulty turning over in bed (including adjusting bedclothes, sheets  and blankets)?: A Lot Difficulty moving from lying on back to sitting on the side of the bed? : Unable Difficulty sitting down on and standing up from a chair with arms (e.g., wheelchair, bedside commode, etc,.)?: None Help needed moving to and from a bed to chair (including a wheelchair)?: A Little Help needed walking in hospital room?: A Little Help needed climbing 3-5 steps with a railing? : A Lot 6 Click Score: 15    End of Session Equipment Utilized During Treatment: Gait belt Activity Tolerance: Patient limited by fatigue Patient left: in chair;with call bell/phone within reach   PT Visit Diagnosis: Unsteadiness on feet (R26.81);Other abnormalities of gait and mobility (R26.89);Muscle weakness (generalized) (M62.81)     Time: 6962-9528 PT Time Calculation (min) (ACUTE ONLY): 19 min  Charges:  $Gait Training: 8-22 mins          Celest Reitz B. Ellie Spickler, PT, DPT [phone]            08/05/2017, 2:19 PM

## 2017-08-05 NOTE — Care Management Note (Signed)
Case Management Note  Patient Details  Name: Scott Lane MRN: [id number] Date of Birth: 11-Aug-1985  Subjective/Objective:   Pt is a 32 y/o male who presented to the ED on 4/24 with an episode of LE weakness; MRI and CT scan showed severe spinal stenosis at T9-10 and T10-11 and T11-12; pt is now s/p Decompressive thoracic laminectomy, medial facetectomy T9-10 and T10-11 T11-12 on 08/01/17.  PTA, pt independent, lives with spouse.                  Action/Plan: OT recommending CIR; PT eval pending.  Would recommend CIR consult for eligibility.  Will follow.  Expected Discharge Date:                  Expected Discharge Plan:  IP Rehab Facility  In-House Referral:     Discharge planning Services  CM Consult  Post Acute Care Choice:    Choice offered to:     DME Arranged:    DME Agency:     HH Arranged:    HH Agency:     Status of Service:  Completed, signed off  If discussed at [employer] of Stay Meetings, dates discussed:    Additional Comments:  08/05/17 J. Rourke Mcquitty, Charity fundraiser, BSN Pt medically stable for discharge, and insurance has approved admission to [facility].  Plan dc to CIR later today.  Quintella Baton, RN, BSN  Trauma/Neuro ICU Case Manager [phone]

## 2017-08-05 NOTE — Progress Notes (Signed)
Patient and family were informed about rehab process,including patient safety plan and rehab booklet.

## 2017-08-05 NOTE — Discharge Summary (Signed)
Physician Discharge Summary  Patient ID: Scott Lane MRN: [id number] DOB/AGE: [birth year] 32 y.o.  Admit date: 07/31/2017 Discharge date: 08/05/2017  Admission Diagnoses: thoracic stenosis with myelopathy    Discharge Diagnoses:same   Discharged Condition: stable  Hospital Course: The patient was admitted on 07/31/2017 and taken to the operating room where the patient underwent TL T9-T12. The patient tolerated the procedure well and was taken to the recovery room and then to the floor in stable condition. The hospital course was routine. There were no complications. The wound remained clean dry and intact. Pt had appropriate back soreness. No complaints of leg pain or new N/T/W. The patient remained afebrile with stable vital signs, and tolerated a regular diet. The patient continued to increase activities, and pain was well controlled with oral pain medications.   Consults: rehabilitation medicine  Significant Diagnostic Studies:  Results for orders placed or performed during the hospital encounter of 07/31/17  MRSA PCR Screening  Result Value Ref Range   MRSA by PCR NEGATIVE NEGATIVE  CBC  Result Value Ref Range   WBC 9.3 4.0 - 10.5 K/uL   RBC 5.23 4.22 - 5.81 MIL/uL   Hemoglobin 15.4 13.0 - 17.0 g/dL   HCT 40.9 81.1 - 91.4 %   MCV 85.7 78.0 - 100.0 fL   MCH 29.4 26.0 - 34.0 pg   MCHC 34.4 30.0 - 36.0 g/dL   RDW 78.2 95.6 - 21.3 %   Platelets 214 150 - 400 K/uL  Basic metabolic panel  Result Value Ref Range   Sodium 138 135 - 145 mmol/L   Potassium 3.8 3.5 - 5.1 mmol/L   Chloride 104 101 - 111 mmol/L   CO2 27 22 - 32 mmol/L   Glucose, Bld 89 65 - 99 mg/dL   BUN 14 6 - 20 mg/dL   Creatinine, Ser 0.86 0.61 - 1.24 mg/dL   Calcium 9.3 8.9 - 57.8 mg/dL   GFR calc non Af Amer >60 >60 mL/min   GFR calc Af Amer >60 >60 mL/min   Anion gap 7 5 - 15  HIV antibody (Routine Testing)  Result Value Ref Range   HIV Screen 4th Generation wRfx Non Reactive Non Reactive  Type and  screen [facility]  Result Value Ref Range   ABO/RH(D) O POS    Antibody Screen NEG    Sample Expiration      08/03/2017 Performed at [facility], [address] [place], [place] [address]   ABO/Rh  Result Value Ref Range   ABO/RH(D)      O POS Performed at [facility], [address], [place], [place] [address]     Ct Thoracic Spine Wo Contrast  Result Date: 08/01/2017 CLINICAL DATA:  Bilateral lower extremity weakness after throwing a ball, felt a pop. Leg weakness. EXAM: CT THORACIC SPINE WITHOUT CONTRAST TECHNIQUE: Multidetector CT images of the thoracic were obtained using the standard protocol without intravenous contrast. COMPARISON:  MRI thoracic spine July 31, 2017 FINDINGS: ALIGNMENT: Maintained thoracic lordosis. No malalignment. VERTEBRAE: Vertebral bodies and posterior elements are intact. Intervertebral disc heights preserved, multilevel mild ventral endplate spurring. No destructive bony lesions. Congenital canal stenosis. PARASPINAL AND OTHER SOFT TISSUES: Included prevertebral and paraspinal soft tissues are normal. DISC LEVELS: Multilevel calcified ligamentum flavum superimposed on congenital canal narrowing. Moderate canal stenosis T1-2, severe canal stenosis T2-3, moderate canal stenosis T3-4, T4-5, mild T5-6, mild T6-7, mild T7-8 with narrowed RIGHT lateral recess. Severe canal stenosis T9-10,  T10-11 and T11-12. Severe neural foraminal narrowing at all levels varying from RIGHT to LEFT. IMPRESSION: 1. No fracture or malalignment. 2. Multilevel calcified ligamentum flavum superimposed on congenital canal narrowing. 3. Severe canal stenosis T2-3, T9-10 through T11-12. 4. Multilevel severe neural foraminal narrowing. Electronically Signed   By: Awilda Metro M.D.   On: 08/01/2017 00:25   Mr Cervical Spine Wo Contrast  Result Date: 08/01/2017 CLINICAL DATA:  Bilateral lower extremity weakness worsening for 2 weeks after throwing a golf ball.  EXAM: MRI CERVICAL SPINE WITHOUT CONTRAST TECHNIQUE: Multiplanar, multisequence MR imaging of the cervical spine was performed. No intravenous contrast was administered. COMPARISON:  MRI and CT thoracic spine July 31, 2017. FINDINGS: ALIGNMENT: Straightened cervical lordosis.  No malalignment. VERTEBRAE/DISCS: Vertebral bodies are intact. Intervertebral disc morphology's and signal are normal. Mild chronic discogenic endplate changes C5-6. No abnormal or acute bone marrow signal. Mild congenital canal narrowing,, less significant than thoracic spine. CORD:Cervical spinal cord is normal morphology and signal characteristics from the cervicomedullary junction to level of T2-3, the most caudal well visualized level. POSTERIOR FOSSA, VERTEBRAL ARTERIES, PARASPINAL TISSUES: No MR findings of ligamentous injury. Vertebral artery flow voids present. Included posterior fossa and paraspinal soft tissues are normal. Mild platybasia. DISC LEVELS (mildly motion degraded axial sequences): C2-3: No disc bulge, canal stenosis nor neural foraminal narrowing. C3-4: Tiny broad-based central disc protrusion. Minimal canal stenosis. No neural foraminal narrowing. C4-5: Small broad-based central disc protrusion. Minimal canal stenosis. No neural foraminal narrowing C5-6: Small broad-based central disc protrusion and annular fissure. Minimal canal stenosis. No neural foraminal narrowing. C6-7 and C7-T1: No disc bulge, canal stenosis nor neural foraminal narrowing. IMPRESSION: 1. Congenital canal narrowing. No fracture, malalignment or acute osseous process. 2. Minimal canal stenosis C3-4 through C5-6. No neural foraminal narrowing. Electronically Signed   By: Awilda Metro M.D.   On: 08/01/2017 01:32   Mr Thoracic Spine Wo Contrast  Result Date: 07/31/2017 CLINICAL DATA:  The patient felt a pop in his legs with onset of inability to move them after throwing a golf ball today. Numbness. EXAM: MRI THORACIC AND LUMBAR SPINE WITHOUT  CONTRAST TECHNIQUE: Multiplanar and multiecho pulse sequences of the thoracic and lumbar spine were obtained without intravenous contrast. COMPARISON:  None. FINDINGS: MRI THORACIC SPINE FINDINGS Alignment:  Maintained. Vertebrae: Height and signal are normal. This patient has a congenitally narrow central spinal canal due to short pedicle length throughout. Cord: There appears to be edema within the distal cord at the inferior margin at T10-11 just superior to the disc interspace. There may also be edema within the cord at T11-12 Paraspinal and other soft tissues: Negative. Disc levels: C7-T1: Negative. T1-2: There is ligamentum flavum thickening and a minimal disc bulge but the central spinal canal and foramina appear open. T1-2: Shallow bulge to the left effaces the ventral thecal sac. The foramina appear open. T2-3: There is ligamentum flavum thickening and a disc bulge. The cord is markedly deformed with a triangular configuration identified. Marked left foraminal narrowing is seen. T3-4: Shallow disc bulge effaces the ventral thecal sac. The foramina appear open. T4-5: Shallow disc bulge without stenosis. T5-6: Negative. T6-7: Ligamentum flavum thickening. No disc bulge or protrusion. The central canal and foramina are open. T7-8: Ligamentum flavum thickening.  Otherwise negative. T8-9: Ligamentum flavum thickening.  Otherwise negative. T9-10: There is ligamentum flavum thickening and a shallow disc bulge. Moderate central canal narrowing is present. The foramina appear open. T10-11: There is bulky ligamentum flavum thickening and a shallow disc  bulge. There is severe central canal stenosis and marked flattening of the cord. Severe bilateral foraminal narrowing is also seen. T11-12: Bulky ligamentum flavum thickening. No disc bulge. The cord is deformed. The foramina are narrowed bilaterally. MRI LUMBAR SPINE FINDINGS Segmentation:  Standard. Alignment:  Maintained. Vertebrae: No fracture or worrisome lesion.  Congenitally narrow central canal due to short pedicle length noted. Conus medullaris and cauda equina: Conus extends to the L1 level. Conus and cauda equina appear normal. Paraspinal and other soft tissues: Negative. Disc levels: L1-2 is imaged in the sagittal plane only. There is a shallow disc bulge but the central canal and foramina appear open. L2-3: Negative. L3-4: Shallow disc bulge and ligamentum flavum thickening. The central canal and foramina remain open. There is some facet arthropathy. L4-5: Broad-based right paracentral disc protrusion causes narrowing in the right subarticular recess and impingement on the descending right L5 root. The ventral thecal sac is deformed by disc. Disc also results in mild to moderate right foraminal narrowing. The left foramen is open. L5-S1: Shallow disc bulge with an annular fissure. The central canal is open. Moderate to moderately severe bilateral foraminal narrowing is present. IMPRESSION: MR THORACIC SPINE IMPRESSION Marked congenital narrowing of the central canal throughout due to short pedicle length. Severe central canal stenosis at T9-10 due to a shallow disc bulge and bulky ligamentum flavum thickening. There appears to be edema within the cord at this level. Marked central canal stenosis at T11-12 where there is bulky ligamentum flavum thickening. There appears to be edema within the cord at this level. Ligamentum flavum thickening and a shallow disc bulge at T2-3 cause moderately severe to severe central canal stenosis. The cord has an abnormal triangular configuration. No edema within the cord is seen at this level. There is also left foraminal narrowing at this level. MR LUMBAR SPINE IMPRESSION Congenitally narrow central spinal canal throughout. Broad-based right paracentral protrusion at L4-5 impinges on the descending right L5 root and causes moderate right foraminal narrowing. Moderate to moderately severe bilateral foraminal narrowing L5-S1. Critical  Value/emergent results were called by telephone at the time of interpretation on 07/31/2017 at 8:36 pm to Dr. Shaune Pollack , who verbally acknowledged these results. Electronically Signed   By: Drusilla Kanner M.D.   On: 07/31/2017 20:43   Mr Lumbar Spine Wo Contrast  Result Date: 07/31/2017 CLINICAL DATA:  The patient felt a pop in his legs with onset of inability to move them after throwing a golf ball today. Numbness. EXAM: MRI THORACIC AND LUMBAR SPINE WITHOUT CONTRAST TECHNIQUE: Multiplanar and multiecho pulse sequences of the thoracic and lumbar spine were obtained without intravenous contrast. COMPARISON:  None. FINDINGS: MRI THORACIC SPINE FINDINGS Alignment:  Maintained. Vertebrae: Height and signal are normal. This patient has a congenitally narrow central spinal canal due to short pedicle length throughout. Cord: There appears to be edema within the distal cord at the inferior margin at T10-11 just superior to the disc interspace. There may also be edema within the cord at T11-12 Paraspinal and other soft tissues: Negative. Disc levels: C7-T1: Negative. T1-2: There is ligamentum flavum thickening and a minimal disc bulge but the central spinal canal and foramina appear open. T1-2: Shallow bulge to the left effaces the ventral thecal sac. The foramina appear open. T2-3: There is ligamentum flavum thickening and a disc bulge. The cord is markedly deformed with a triangular configuration identified. Marked left foraminal narrowing is seen. T3-4: Shallow disc bulge effaces the ventral thecal sac. The foramina appear  open. T4-5: Shallow disc bulge without stenosis. T5-6: Negative. T6-7: Ligamentum flavum thickening. No disc bulge or protrusion. The central canal and foramina are open. T7-8: Ligamentum flavum thickening.  Otherwise negative. T8-9: Ligamentum flavum thickening.  Otherwise negative. T9-10: There is ligamentum flavum thickening and a shallow disc bulge. Moderate central canal narrowing is  present. The foramina appear open. T10-11: There is bulky ligamentum flavum thickening and a shallow disc bulge. There is severe central canal stenosis and marked flattening of the cord. Severe bilateral foraminal narrowing is also seen. T11-12: Bulky ligamentum flavum thickening. No disc bulge. The cord is deformed. The foramina are narrowed bilaterally. MRI LUMBAR SPINE FINDINGS Segmentation:  Standard. Alignment:  Maintained. Vertebrae: No fracture or worrisome lesion. Congenitally narrow central canal due to short pedicle length noted. Conus medullaris and cauda equina: Conus extends to the L1 level. Conus and cauda equina appear normal. Paraspinal and other soft tissues: Negative. Disc levels: L1-2 is imaged in the sagittal plane only. There is a shallow disc bulge but the central canal and foramina appear open. L2-3: Negative. L3-4: Shallow disc bulge and ligamentum flavum thickening. The central canal and foramina remain open. There is some facet arthropathy. L4-5: Broad-based right paracentral disc protrusion causes narrowing in the right subarticular recess and impingement on the descending right L5 root. The ventral thecal sac is deformed by disc. Disc also results in mild to moderate right foraminal narrowing. The left foramen is open. L5-S1: Shallow disc bulge with an annular fissure. The central canal is open. Moderate to moderately severe bilateral foraminal narrowing is present. IMPRESSION: MR THORACIC SPINE IMPRESSION Marked congenital narrowing of the central canal throughout due to short pedicle length. Severe central canal stenosis at T9-10 due to a shallow disc bulge and bulky ligamentum flavum thickening. There appears to be edema within the cord at this level. Marked central canal stenosis at T11-12 where there is bulky ligamentum flavum thickening. There appears to be edema within the cord at this level. Ligamentum flavum thickening and a shallow disc bulge at T2-3 cause moderately severe to  severe central canal stenosis. The cord has an abnormal triangular configuration. No edema within the cord is seen at this level. There is also left foraminal narrowing at this level. MR LUMBAR SPINE IMPRESSION Congenitally narrow central spinal canal throughout. Broad-based right paracentral protrusion at L4-5 impinges on the descending right L5 root and causes moderate right foraminal narrowing. Moderate to moderately severe bilateral foraminal narrowing L5-S1. Critical Value/emergent results were called by telephone at the time of interpretation on 07/31/2017 at 8:36 pm to Dr. Shaune Pollack , who verbally acknowledged these results. Electronically Signed   By: Drusilla Kanner M.D.   On: 07/31/2017 20:43   Dg Thoracic Spine 1 View  Result Date: 08/01/2017 CLINICAL DATA:  LOCALIZATION IMAGING: Thoracic nine, ten, eleven, twelve laminectomy RSTO: BAP/CLH FLUORO TIME: 8 SECS EXAM: DG C-ARM 61-120 MIN; THORACIC SPINE - 1 VIEW COMPARISON:  CT 07/31/2017 FINDINGS: Posterior retractor is in place. Surgical instruments overlie the thoracic spine, levels not distinguishable on this single image. IMPRESSION: Intraoperative localization. Electronically Signed   By: Norva Pavlov M.D.   On: 08/01/2017 19:55   Dg C-arm 1-60 Min  Result Date: 08/01/2017 CLINICAL DATA:  LOCALIZATION IMAGING: Thoracic nine, ten, eleven, twelve laminectomy RSTO: BAP/CLH FLUORO TIME: 8 SECS EXAM: DG C-ARM 61-120 MIN; THORACIC SPINE - 1 VIEW COMPARISON:  CT 07/31/2017 FINDINGS: Posterior retractor is in place. Surgical instruments overlie the thoracic spine, levels not distinguishable on this single  image. IMPRESSION: Intraoperative localization. Electronically Signed   By: Norva Pavlov M.D.   On: 08/01/2017 19:55    Antibiotics:  Anti-infectives (From admission, onward)   Start     Dose/Rate Route Frequency Ordered Stop   08/02/17 0200  ceFAZolin (ANCEF) IVPB 2g/100 mL premix     2 g 200 mL/hr over 30 Minutes Intravenous  Every 8 hours 08/01/17 2037 08/02/17 1047   08/01/17 1718  ceFAZolin (ANCEF) 2-4 GM/100ML-% IVPB    Note to Pharmacy:  Sabino Niemann   : cabinet override      08/01/17 1718 08/02/17 0529   07/31/17 2207  ceFAZolin (ANCEF) 2-4 GM/100ML-% IVPB    Note to Pharmacy:  Myra Rude   : cabinet override      07/31/17 2207 08/01/17 1014      Discharge Exam: Blood pressure (!) 143/87, pulse 81, temperature 98.2 F (36.8 C), resp. rate 18, height 6' (1.829 m), weight 111 kg (244 lb 11.4 oz), SpO2 94 %. Neurologic: Grossly normal with spastic gait and mild weakness in legs Dressing dry  Discharge Medications:   Allergies as of 08/05/2017   No Known Allergies     Medication List    STOP taking these medications   ibuprofen 200 MG tablet Commonly known as:  ADVIL,MOTRIN   multivitamin Tabs tablet            Durable Medical Equipment  (From admission, onward)        Start     Ordered   08/01/17 2038  DME Walker rolling  Once    Question:  Patient needs a walker to treat with the following condition  Answer:  Myelopathy (HCC)   08/01/17 2037      Disposition: CIR   Final Dx: thoracic stenosis with myelopathy  Discharge Instructions    Diet - low sodium heart healthy   Complete by:  As directed    Increase activity slowly   Complete by:  As directed          Signed: Mireyah Chervenak S 08/05/2017, 7:23 PM

## 2017-08-06 ENCOUNTER — Inpatient Hospital Stay (HOSPITAL_COMMUNITY): Payer: 59

## 2017-08-06 ENCOUNTER — Inpatient Hospital Stay (HOSPITAL_COMMUNITY): Payer: 59 | Admitting: Occupational Therapy

## 2017-08-06 DIAGNOSIS — M7989 Other specified soft tissue disorders: Secondary | ICD-10-CM

## 2017-08-06 DIAGNOSIS — R030 Elevated blood-pressure reading, without diagnosis of hypertension: Secondary | ICD-10-CM

## 2017-08-06 LAB — CBC WITH DIFFERENTIAL/PLATELET
Basophils Absolute: 0 10*3/uL (ref 0.0–0.1)
Basophils Relative: 0 %
EOS ABS: 0 10*3/uL (ref 0.0–0.7)
Eosinophils Relative: 0 %
HEMATOCRIT: 43.4 % (ref 39.0–52.0)
HEMOGLOBIN: 14.9 g/dL (ref 13.0–17.0)
Lymphocytes Relative: 10 %
Lymphs Abs: 1.1 10*3/uL (ref 0.7–4.0)
MCH: 30 pg (ref 26.0–34.0)
MCHC: 34.3 g/dL (ref 30.0–36.0)
MCV: 87.3 fL (ref 78.0–100.0)
Monocytes Absolute: 1.5 10*3/uL — ABNORMAL HIGH (ref 0.1–1.0)
Monocytes Relative: 14 %
NEUTROS PCT: 76 %
Neutro Abs: 8.3 10*3/uL — ABNORMAL HIGH (ref 1.7–7.7)
Platelets: 279 10*3/uL (ref 150–400)
RBC: 4.97 MIL/uL (ref 4.22–5.81)
RDW: 12.6 % (ref 11.5–15.5)
WBC: 10.8 10*3/uL — ABNORMAL HIGH (ref 4.0–10.5)

## 2017-08-06 LAB — COMPREHENSIVE METABOLIC PANEL
ALK PHOS: 50 U/L (ref 38–126)
ALT: 44 U/L (ref 17–63)
ANION GAP: 10 (ref 5–15)
AST: 25 U/L (ref 15–41)
Albumin: 3.3 g/dL — ABNORMAL LOW (ref 3.5–5.0)
BILIRUBIN TOTAL: 0.7 mg/dL (ref 0.3–1.2)
BUN: 15 mg/dL (ref 6–20)
CALCIUM: 9.3 mg/dL (ref 8.9–10.3)
CO2: 29 mmol/L (ref 22–32)
Chloride: 98 mmol/L — ABNORMAL LOW (ref 101–111)
Creatinine, Ser: 0.92 mg/dL (ref 0.61–1.24)
GFR calc non Af Amer: >60 mL/min (ref >60)
Glucose, Bld: 89 mg/dL (ref 65–99)
Potassium: 4 mmol/L (ref 3.5–5.1)
SODIUM: 137 mmol/L (ref 135–145)
TOTAL PROTEIN: 7.6 g/dL (ref 6.5–8.1)

## 2017-08-06 MED ORDER — MENTHOL 3 MG MT LOZG
1.0000 | LOZENGE | OROMUCOSAL | Status: DC | PRN
  Administered 2017-08-06: 3 mg via ORAL
  Filled 2017-08-06 (×2): qty 9

## 2017-08-06 MED ORDER — TRAZODONE HCL 50 MG PO TABS
50.0000 mg | ORAL_TABLET | Freq: Every evening | ORAL | Status: DC | PRN
  Administered 2017-08-06 – 2017-08-08 (×3): 50 mg via ORAL
  Filled 2017-08-06 (×3): qty 1

## 2017-08-06 NOTE — Progress Notes (Signed)
Preliminary notes---Bilateral lower extremities venous duplex study  Completed.  Negative for deep and superficial veins thrombosis.   Hongying Jaquelynn Wanamaker (RDMS RVT) 08/06/17 11:37 AM

## 2017-08-06 NOTE — Evaluation (Signed)
Occupational Therapy Assessment and Plan  Patient Details  Name: Scott Lane MRN: [id number] Date of Birth: [birth year]  OT Diagnosis: acute pain, muscle weakness (generalized) and paraparesis at level T8 Rehab Potential: Rehab Potential (ACUTE ONLY): Excellent ELOS: 7-9 days   Today's Date: 08/06/2017 OT Individual Time: 7253-6644 OT Individual Time Calculation (min): 84 min     Problem List:  Patient Active Problem List   Diagnosis Date Noted  . Elevated blood pressure reading   . Thoracic myelopathy 08/05/2017  . Neuropathic pain   . Muscle spasm   . Drug induced constipation   . Urinary retention   . Class 1 obesity due to excess calories with serious comorbidity and body mass index (BMI) of 33.0 to 33.9 in adult   . Myelopathy ([place]) 08/01/2017  . Thoracic spinal stenosis 07/31/2017  . Obesity (BMI 30.0-34.9) 05/17/2017    Past Medical History:  Past Medical History:  Diagnosis Date  . Chicken pox    Past Surgical History:  Past Surgical History:  Procedure Laterality Date  . LUMBAR LAMINECTOMY/DECOMPRESSION MICRODISCECTOMY N/A 08/01/2017   Procedure: Thoracic nine, ten, eleven, twelve laminectomy;  Surgeon: Eustace Moore, MD;  Location: [facility];  Service: Neurosurgery;  Laterality: N/A;  . none      Assessment & Plan Clinical Impression: Patient is a 32 y.o. year old male with recent admission to the hospital on 07/31/2017 with persistent lower extremity weakness and numbness after throwing a golf ball when he felt an electric shock down his legs that persisted over 2-1/2 weeks.  MRI reviewed, showing thoracic spinal stenosis.  Per report, severe spinal stenosis at T9-12 with cord edema.  Underwent decompressive thoracic laminectomy, medial facetectomy T9-10 and T10-11, T11-12 08/01/2017 per Dr. Sherley Bounds.   Patient transferred to CIR on 08/05/2017 .    Patient currently requires mod with basic self-care skills secondary to muscle weakness, impaired timing and  sequencing, unbalanced muscle activation and decreased coordination and decreased sitting balance, decreased standing balance, decreased postural control and decreased balance strategies.  Prior to hospitalization, patient could complete ADLs with independent .  Patient will benefit from skilled intervention to decrease level of assist with basic self-care skills, increase independence with basic self-care skills and increase level of independence with iADL prior to discharge home with care partner.  Anticipate patient will require intermittent supervision and follow up home health.  OT - End of Session Activity Tolerance: Tolerates 30+ min activity without fatigue Endurance Deficit: No OT Assessment Rehab Potential (ACUTE ONLY): Excellent OT Barriers to Discharge: Home environment access/layout OT Barriers to Discharge Comments: Pt has not bedrooms on the first floor. OT Patient demonstrates impairments in the following area(s): Balance;Motor;Pain OT Basic ADL's Functional Problem(s): Grooming;Bathing;Dressing;Toileting OT Advanced ADL's Functional Problem(s): Simple Meal Preparation OT Transfers Functional Problem(s): Tub/Shower;Toilet OT Plan OT Intensity: Minimum of 1-2 x/day, 45 to 90 minutes OT Frequency: 5 out of 7 days OT Duration/Estimated Length of Stay: 7-9 days OT Treatment/Interventions: Medical illustrator training;Community reintegration;Discharge planning;DME/adaptive equipment instruction;Neuromuscular re-education;Functional mobility training;Pain management;Self Care/advanced ADL retraining;Therapeutic Activities;Therapeutic Exercise;Patient/family education;UE/LE Strength taining/ROM OT Self Feeding Anticipated Outcome(s): independent OT Basic Self-Care Anticipated Outcome(s): modified independent OT Toileting Anticipated Outcome(s): modified independent OT Bathroom Transfers Anticipated Outcome(s): modified independent OT Recommendation Patient destination: Home Follow  Up Recommendations: Home health OT Equipment Recommended: To be determined   Skilled Therapeutic Intervention Began session with work on selfcare retraining at shower level.  Pt needing min assist for transfer into the shower to the shower bench with use  of the RW.  Min assist for bathing sit to stand with decreased ability to bring his LEs up to his knees for bathing.  This was also the same for dressing with therapist having to assist with crossing them over the opposite knee.  Increased tightness noted in both hips with external rotation.  Will benefit from AE for LB dressing.  Increased LOB posteriorly, anteriorly, and to the left with static standing secondary to delayed balance reactions and hip/ankle strategies.  Noted left to dragging frequently with mobility when using the RW with increased weightbearing and trunk flexion noted as well.  Min instructional cueing to correct posture.  He was able to ambulate to and from the therapy gym with min assist using the RW.  No report of fatigue with this.  Pt left in wheelchair with call button in reach.    OT Evaluation Precautions/Restrictions  Precautions Precautions: Back;Fall Restrictions Weight Bearing Restrictions: No  Pain Pain Assessment Pain Scale: 0-10 Pain Score: 0-No pain Home Living/Prior Functioning Home Living Family/patient expects to be discharged to:: Private residence Living Arrangements: Spouse/significant other Available Help at Discharge: Family, Available PRN/intermittently Home Access: Stairs to enter Home Layout: Two level Alternate Level Stairs-Number of Steps: 12-15 steps up to second level Alternate Level Stairs-Rails: Right Bathroom Shower/Tub: Chiropodist: Standard IADL History Homemaking Responsibilities: Yes Meal Prep Responsibility: Secondary Laundry Responsibility: Primary Cleaning Responsibility: Primary Current License: Yes Mode of Transportation: Car Occupation: Full time  employment 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, football coach Prior Function Level of Independence: Independent with basic ADLs  Able to Take Stairs?: Yes ADL  See Function Section of chart for details Vision Baseline Vision/History: No visual deficits Patient Visual Report: No change from baseline Vision Assessment?: No apparent visual deficits Perception  Perception: Within Functional Limits Praxis Praxis: Intact Cognition Overall Cognitive Status: Within Functional Limits for tasks assessed Arousal/Alertness: Awake/alert Orientation Level: Person;Place;Situation Person: Oriented Place: Oriented Situation: Oriented Year: 2019 Month: April Day of Week: Correct Memory: Appears intact Immediate Memory Recall: Sock;Blue;Bed Memory Recall: Sock;Blue;Bed Memory Recall Sock: Without Cue Memory Recall Blue: Without Cue Memory Recall Bed: Without Cue Attention: Sustained;Selective Focused Attention: Appears intact Sustained Attention: Appears intact Selective Attention: Appears intact Awareness: Appears intact Problem Solving: Appears intact Safety/Judgment: Appears intact Sensation Sensation Light Touch: Appears Intact Stereognosis: Appears Intact Hot/Cold: Appears Intact Proprioception: Appears Intact Additional Comments: Sensation intact in BUEs Coordination Gross Motor Movements are Fluid and Coordinated: Yes(UE coordination intact, LE coordination impaired secondary to weakness of motor involvement.) Fine Motor Movements are Fluid and Coordinated: Yes Motor  Motor Motor: Motor impersistence Motor - Skilled Clinical Observations: BLE weakness with decreased coordination and strength, right more impaired than left.   Mobility  Transfers Transfers: Sit to Stand;Stand to Sit Sit to Stand: 4: Min assist;With upper extremity assist;From chair/3-in-1 Stand to Sit: 4: Min assist;With upper extremity assist;To chair/3-in-1   Trunk/Postural Assessment  Cervical Assessment Cervical Assessment: Within Functional Limits Thoracic Assessment Thoracic Assessment: Exceptions to WFL(slight thoracic rounding in sitting with incision noted) Lumbar Assessment Lumbar Assessment: Exceptions to WFL(slight lumbar flexion noted at rest in sitting) Postural Control Postural Control: Deficits on evaluation Righting Reactions: Pt with LOB to the left as well as posteriorly and anteriorly with static standing and no UE support.   Balance Balance Balance Assessed: Yes Static Sitting Balance Static Sitting - Balance Support: No upper extremity supported Static Sitting - Level of Assistance: 5: Stand by assistance Dynamic Sitting Balance Dynamic Sitting -  Balance Support: During functional activity Dynamic Sitting - Level of Assistance: 4: Min Insurance risk surveyor Standing - Balance Support: During functional activity Static Standing - Level of Assistance: 4: Min assist Dynamic Standing Balance Dynamic Standing - Balance Support: During functional activity Dynamic Standing - Level of Assistance: 3: Mod assist Extremity/Trunk Assessment RUE Assessment RUE Assessment: Within Functional Limits(AROM and strength WFls for ADL tasks.  Not formally assessed secondary to precautions.) LUE Assessment LUE Assessment: Within Functional Limits(AROM and strength WFls for ADL tasks.  Not formally assessed secondary to precautions)   See Function Navigator for Current Functional Status.   Refer to Care Plan for Long Term Goals  Recommendations for other services: None    Discharge Criteria: Patient will be discharged from OT if patient refuses treatment 3 consecutive times without medical reason, if treatment goals not met, if there is a change in medical status, if patient makes no progress towards goals or if patient is discharged from hospital.  The above assessment, treatment plan, treatment alternatives and  goals were discussed and mutually agreed upon: by patient  Daoud Lobue OTR/L 08/06/2017, 9:51 AM

## 2017-08-06 NOTE — Progress Notes (Signed)
[facility] PHYSICAL MEDICINE & REHABILITATION     PROGRESS NOTE  Subjective/Complaints:  Pt seen sitting up in bed this AM.  He did not sleep well overnight because he was adjusting to his new bed.  He is eager to begin therapies.  ROS: Denies CP, SOB, N/V/D.  Objective: Vital Signs: Blood pressure (!) 141/80, pulse 63, temperature 97.9 F (36.6 C), temperature source Oral, resp. rate 18, height 6' (1.829 m), weight 109 kg (240 lb 4.8 oz), SpO2 95 %. No results found. No results for input(s): WBC, HGB, HCT, PLT in the last 72 hours. No results for input(s): NA, K, CL, GLUCOSE, BUN, CREATININE, CALCIUM in the last 72 hours.  Invalid input(s): CO CBG (last 3)  No results for input(s): GLUCAP in the last 72 hours.  Wt Readings from Last 3 Encounters:  08/05/17 109 kg (240 lb 4.8 oz)  08/01/17 111 kg (244 lb 11.4 oz)  05/17/17 114.9 kg (253 lb 3.2 oz)    Physical Exam:  BP (!) 141/80 (BP Location: Right Arm)   Pulse 63   Temp 97.9 F (36.6 C) (Oral)   Resp 18   Ht 6' (1.829 m)   Wt 109 kg (240 lb 4.8 oz)   SpO2 95%   BMI 32.59 kg/m  Constitutional: He appears well-developed. Obese. HENT: Normocephalic and atraumatic.  Eyes: EOM are normal. No discharge.  Cardiovascular: Normal rate, regular rhythm. No JVD. Respiratory: Effort normal and breath sounds normal.  GI: Bowel sounds are normal. He exhibits no distension.  Musc: No edema or tenderness in extremities Neurological: Patient is alert and oriented.   Follows full commands.  Motor: B/l UE 5/5 proximal to distal RLE: 4+-5/5 proximal to distal (stable) LLE: 4-4+/5 proximal to distal (stable) Skin.  Back incision clean and dry.  Assessment/Plan: 1. Functional deficits secondary to thoracic stenosis with myelopathy s/p decompression which require 3+ hours per day of interdisciplinary therapy in a comprehensive inpatient rehab setting. Physiatrist is providing close team supervision and 24 hour management of active  medical problems listed below. Physiatrist and rehab team continue to assess barriers to discharge/monitor patient progress toward functional and medical goals.  Function:  Bathing Bathing position      Bathing parts      Bathing assist        Upper Body Dressing/Undressing Upper body dressing Upper body dressing/undressing activity did not occur: N/A                  Upper body assist        Lower Body Dressing/Undressing Lower body dressing   What is the patient wearing?: Non-skid slipper socks, Pants       Pants- Performed by helper: Thread/unthread right pants leg, Thread/unthread left pants leg, Pull pants up/down   Non-skid slipper socks- Performed by helper: Don/doff right sock, Don/doff left sock                  Lower body assist Assist for lower body dressing: Touching or steadying assistance (Pt > 75%)      Toileting Toileting Toileting activity did not occur: Safety/medical concerns        Toileting assist     Transfers Chair/bed Optician, dispensing          Cognition Comprehension Comprehension assist level: Follows complex conversation/direction with extra time/assistive device  Expression  Expression assist level: Expresses complex ideas: With extra time/assistive device  Social Interaction Social Interaction assist level: Interacts appropriately with others - No medications needed.  Problem Solving Problem solving assist level: Solves complex problems: With extra time  Memory Memory assist level: Complete Independence: No helper    Medical Problem List and Plan: 1.  Sudden onset of leg paralysis secondary to thoracic stenosis with myelopathy.  Status post decompressive thoracic laminectomy T9-10 and T10-11, T11-12.  No back brace required  Begin CIR 2.  DVT Prophylaxis/Anticoagulation: SCDs.    Vascular study pending 3. Pain Management: Celebrex 200 mg every 12 hours,  hydrocodone and Robaxin as needed 4. Mood: Provide emotional support 5. Neuropsych: This patient is capable of making decisions on his own behalf. 6. Skin/Wound Care: Routine skin checks 7. Fluids/Electrolytes/Nutrition: Routine I/O's    Labs pending 8.  Constipation.  Laxative assistance  Will consider increase in meds tomorrow 9.  Urinary retention.  Flomax 0.4 mg daily.    PVR pending 10.  Obesity.  BMI 33.19.  Dietary follow-up 11. Elevated BP  Monitor with increased mobility  LOS (Days) 1 A FACE TO FACE EVALUATION WAS PERFORMED  Jabar Krysiak Karis Juba 08/06/2017 7:53 AM

## 2017-08-06 NOTE — Progress Notes (Signed)
PMR Admission Coordinator Pre-Admission Assessment  Patient: Scott Lane is an 32 y.o., male MRN: [id number] DOB: [birth year] Height: 6' (182.9 cm) Weight: 111 kg (244 lb 11.4 oz)                                                                                                                                                  Insurance Information HMO:     PPO: X     PCP:      IPA:      80/20:      OTHER:  PRIMARY: Phineas Real      Policy#: [id number]      Subscriber: Spouse CM Name: case manager      Phone#: [phone]     Fax#: [phone] Pre-Cert#: [id number]      Employer: Full Time Benefits:  Phone #: verified Kathi Ludwig     Name: [employer] Online Portal  Eff. Date: 04/09/17     Deduct: $2800      Out of Pocket Max: $8000      Life Max: N/A CIR: 80%/20%      SNF: 80%/20% Outpatient: 80%     Co-Pay: 20% Home Health: 80%      Co-Pay: 20% DME: 80%     Co-Pay: 20% Providers: In-network   SECONDARY: None        Emergency Contact Information         Contact Information    Name Relation Home Work [place], [place] Spouse   [phone]     Current Medical History  Patient Admitting Diagnosis: Thoracic stenosis with myelopathy   History of Present Illness: Mr. Scott Sane. Lane is a 32 year old right-handed male with unremarkable past medical history on no prescription medications. Per chart review patient lives with spouse. Independent prior to admission. 2 level home. Spouse works during the day. Presented 07/31/2017 with persistent lower extremity weakness and numbness after throwing a golf ball when he felt an electric shock down his legs that persisted over 2-1/2 weeks. MRI and imaging revealed severe spinal stenosis at T9-12 with cord edema. Underwent decompressive thoracic laminectomy, medial facetectomyT9-10 and T10-11, T11-12 08/01/2017 per Dr. Marikay Alar. Hospital course pain management. No back brace required with routine back precautions directed.  Decadron protocol as advised. Bouts of constipation with laxative assistance as needed. Patient was some difficulty with urination placed on low-dose Flomax. Physical and occupational therapy evaluations completed with recommendations of physical medicine rehab consult. Patient was admitted for a comprehensive rehab program 08/05/17.    Past Medical History      Past Medical History:  Diagnosis Date  . Chicken pox     Family History  family history includes Hypertension in his brother, father, maternal grandmother, mother, and paternal grandmother; Lung cancer in his father and maternal grandmother.  Prior Rehab/Hospitalizations:  Has the patient had  major surgery during 100 days prior to admission? No  Current Medications   Current Facility-Administered Medications:  .  0.9 %  sodium chloride infusion, 250 mL, Intravenous, Continuous, Tia Alert, MD, Last Rate: 1 mL/hr at 08/02/17 0234, 250 mL at 08/02/17 0234 .  acetaminophen (TYLENOL) tablet 650 mg, 650 mg, Oral, Q4H PRN, 650 mg at 08/02/17 0019 **OR** acetaminophen (TYLENOL) suppository 650 mg, 650 mg, Rectal, Q4H PRN, Tia Alert, MD .  bisacodyl (DULCOLAX) suppository 10 mg, 10 mg, Rectal, Daily PRN, Tressie Stalker, MD, 10 mg at 08/04/17 1057 .  celecoxib (CELEBREX) capsule 200 mg, 200 mg, Oral, Q12H, Tia Alert, MD, 200 mg at 08/05/17 0916 .  dexamethasone (DECADRON) injection 4 mg, 4 mg, Intravenous, Q6H **OR** dexamethasone (DECADRON) tablet 4 mg, 4 mg, Oral, Q6H, Tia Alert, MD, 4 mg at 08/05/17 1110 .  HYDROcodone-acetaminophen (NORCO) 7.5-325 MG per tablet 1 tablet, 1 tablet, Oral, Q6H, Tia Alert, MD, 1 tablet at 08/05/17 1110 .  HYDROmorphone (DILAUDID) injection 0.25-0.5 mg, 0.25-0.5 mg, Intravenous, Q5 min PRN, Heather Roberts, MD .  HYDROmorphone (DILAUDID) injection 0.5 mg, 0.5 mg, Intravenous, Q2H PRN, Tia Alert, MD, 0.5 mg at 08/02/17 1524 .  menthol-cetylpyridinium (CEPACOL)  lozenge 3 mg, 1 lozenge, Oral, PRN **OR** phenol (CHLORASEPTIC) mouth spray 1 spray, 1 spray, Mouth/Throat, PRN, Tia Alert, MD .  methocarbamol (ROBAXIN) tablet 500 mg, 500 mg, Oral, Q6H PRN, 500 mg at 08/03/17 1051 **OR** methocarbamol (ROBAXIN) 500 mg in dextrose 5 % 50 mL IVPB, 500 mg, Intravenous, Q6H PRN, Tia Alert, MD .  naloxegol oxalate (MOVANTIK) tablet 25 mg, 25 mg, Oral, Daily, Tia Alert, MD, 25 mg at 08/05/17 0916 .  ondansetron (ZOFRAN) tablet 4 mg, 4 mg, Oral, Q6H PRN **OR** ondansetron (ZOFRAN) injection 4 mg, 4 mg, Intravenous, Q6H PRN, Tia Alert, MD, 4 mg at 08/03/17 1210 .  promethazine (PHENERGAN) injection 6.25-12.5 mg, 6.25-12.5 mg, Intravenous, Q15 min PRN, Heather Roberts, MD .  senna [facility]) tablet 8.6 mg, 1 tablet, Oral, BID, Tia Alert, MD, 8.6 mg at 08/05/17 0915 .  sodium chloride flush (NS) 0.9 % injection 3 mL, 3 mL, Intravenous, Q12H, Tia Alert, MD, 3 mL at 08/05/17 0915 .  sodium chloride flush (NS) 0.9 % injection 3 mL, 3 mL, Intravenous, PRN, Tia Alert, MD .  tamsulosin [facility]) capsule 0.4 mg, 0.4 mg, Oral, QPC breakfast, Tia Alert, MD, 0.4 mg at 08/05/17 8119 .  zolpidem (AMBIEN) tablet 10 mg, 10 mg, Oral, QHS PRN, Tressie Stalker, MD, 10 mg at 08/04/17 2155  Patients Current Diet: Precautions:  No bending, arching, or twisting Diet regular Room service appropriate? Yes; Fluid consistency: Thin  Precautions / Restrictions Precautions Precautions: Back, Fall Precaution Booklet Issued: Yes (comment) Precaution Comments: educated for all precautions with handout provided Restrictions Weight Bearing Restrictions: No   Has the patient had 2 or more falls or a fall with injury in the past year?Yes, the 1 fall just prior to admission that lead to this admission   Prior Activity Level Community (5-7x/wk): Prior to admission patient was fully independent and active.  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    Home Assistive Devices / Equipment Home Assistive Devices/Equipment: None Home Equipment: None  Prior Device Use: Indicate devices/aids used by the patient prior to current illness, exacerbation or injury? None of the above  Prior Functional Level Prior Function Level of Independence: Independent  Self  Care: Did the patient need help bathing, dressing, using the toilet or eating? Independent  Indoor Mobility: Did the patient need assistance with walking from room to room (with or without device)? Independent  Stairs: Did the patient need assistance with internal or external stairs (with or without device)? Independent  Functional Cognition: Did the patient need help planning regular tasks such as shopping or remembering to take medications? Independent  Current Functional Level Cognition  Overall Cognitive Status: Within Functional Limits for tasks assessed Orientation Level: Oriented X4    Extremity Assessment (includes Sensation/Coordination)  Upper Extremity Assessment: Defer to OT evaluation  Lower Extremity Assessment: Overall WFL for tasks assessed(general proximal weakness ?L worse than right with gait)    ADLs  Overall ADL's : Needs assistance/impaired Eating/Feeding: Modified independent, Sitting Grooming: Min guard, Minimal assistance, Standing Upper Body Bathing: Min guard, Sitting Lower Body Bathing: Moderate assistance, Sit to/from stand Upper Body Dressing : Min guard, Sitting Lower Body Dressing: Maximal assistance, Sit to/from stand Toilet Transfer: Minimal assistance, Ambulation, BSC, RW Toilet Transfer Details (indicate cue type and reason): BSC over toilet; simulated in transfer to recliner  Toileting- Clothing Manipulation and Hygiene: Minimal assistance, Sit to/from stand Functional mobility during ADLs: Minimal assistance, Min guard, Rolling walker General ADL Comments: pt completing room and hallway functional mobility using RW  with MinGuard-MinA (+2 for safety this session) educated on adherence to back precautions while completing LB ADLs, will need to practice/review in additional tx sessions     Mobility  Overal bed mobility: Needs Assistance Bed Mobility: Rolling, Sidelying to Sit Rolling: Min assist Sidelying to sit: Mod assist, +2 for safety/equipment General bed mobility comments: Pt was OOB in the recliner chair.    Transfers  Overall transfer level: Needs assistance Equipment used: Rolling walker (2 wheeled) Transfers: Sit to/from Stand Sit to Stand: Supervision General transfer comment: supervision for safety due to heavy reliance on arms for transition to stand.     Ambulation / Gait / Stairs / Wheelchair Mobility  Ambulation/Gait Ambulation/Gait assistance: Architect (Feet): 150 Feet Assistive device: Rolling walker (2 wheeled) Gait Pattern/deviations: Decreased dorsiflexion - left, Decreased stride length, Steppage General Gait Details: Pt started as supervision with gait, and progressed, with fatiuge to min assist as after we walked and practices stairs, bil LEs started snapping back into hyperextension during stance.  Min assist at trunk to ensure he safely made it back to his room before his legs gave completely out on him.  Gait velocity: decreased Gait velocity interpretation: <1.31 ft/sec, indicative of household ambulator Stairs: Yes Stairs assistance: Mod assist Stair Management: One rail Right, Step to pattern, Forwards(one rail and one hand) Number of Stairs: 4(I was fearful of fatigue and him not being able to get down) General stair comments: Pt was able to make it up 4 steps with step to pattern, leading up with his right and down with his left weaker leg.  Pt needs more assist descending the stairs because of poor eccentric control.     Posture / Balance Balance Overall balance assessment: Needs assistance Sitting-balance support: Feet supported, No  upper extremity supported Sitting balance-Leahy Scale: Good Standing balance support: Bilateral upper extremity supported, No upper extremity supported, Single extremity supported Standing balance-Leahy Scale: Fair Standing balance comment: reliant on UE support during mobility     Special needs/care consideration BiPAP/CPAP: No CPM: No Continuous Drip IV: No Dialysis: No         Life Vest: No Oxygen: No Special Bed: No  Trach Size: No Wound Vac (area): No       Skin: Dry, monitoring back surgical incision                                Bowel mgmt: Continent, last BM 08/04/17 with now some constipation  Bladder mgmt: Continent  Diabetic mgmt: No     Previous Home Environment Living Arrangements: Spouse/significant other Available Help at Discharge: Family, Available PRN/intermittently Type of Home: House Home Layout: Two level Alternate Level Stairs-Rails: Right Alternate Level Stairs-Number of Steps: flight  Home Access: Stairs to enter Entrance Stairs-Rails: Right, Left Entrance Stairs-Number of Steps: 1 Bathroom Shower/Tub: Engineer, manufacturing systems: Standard Additional Comments: other family may be able to assist if 24hr supervision needed   Discharge Living Setting Plans for Discharge Living Setting: Patient's home, Lives with (comment)(Spouse and child ) Type of Home at Discharge: House Discharge Home Layout: Two level, 1/2 bath on main level Alternate Level Stairs-Rails: Right Alternate Level Stairs-Number of Steps: 12 steps  Discharge Home Access: Stairs to enter Entrance Stairs-Rails: None Entrance Stairs-Number of Steps: 1 Discharge Bathroom Shower/Tub: Tub/shower unit, Curtain Discharge Bathroom Toilet: Standard Discharge Bathroom Accessibility: Yes How Accessible: Accessible via walker Does the patient have any problems obtaining your medications?: No  Social/Family/Support Systems Patient Roles: Spouse, Parent Contact Information: Spouse:  Michelle Nasuti  Anticipated Caregiver: Spouse intermittent, Mod I goals  Anticipated Caregiver's Contact Information: Cell: [phone] Ability/Limitations of Caregiver: Spouse works, but can work remotely from home some Caregiver Availability: Intermittent Discharge Plan Discussed with Primary Caregiver: Yes Is Caregiver In Agreement with Plan?: Yes Does Caregiver/Family have Issues with Lodging/Transportation while Pt is in Rehab?: No  Goals/Additional Needs Patient/Family Goal for Rehab: PT/OT: Mod I Expected length of stay: 5-7 days  Cultural Considerations: None Dietary Needs: Regular textures and thin liquids  Equipment Needs: TBD Pt/Family Agrees to Admission and willing to participate: Yes Program Orientation Provided & Reviewed with Pt/Caregiver Including Roles  & Responsibilities: Yes  Barriers to Discharge: Home environment access/layout(needs to be able to access 2nd floor rooms and bathroom )  Decrease burden of Care through IP rehab admission: No  Possible need for SNF placement upon discharge: No  Patient Condition: This patient's medical and functional status has changed since the consult dated: 08/03/17 at 10:53 am in which the Rehabilitation Physician determined and documented that the patient's condition is appropriate for intensive rehabilitative care in an inpatient rehabilitation facility. See "History of Present Illness" (above) for medical update. Functional changes are: Min A gait for 150 feet and Mod A stairs. Patient's medical and functional status update has been discussed with the Rehabilitation physician and patient remains appropriate for inpatient rehabilitation. Will admit to inpatient rehab today.  Preadmission Screen Completed By:  Fae Pippin, 08/05/2017 4:26 PM ______________________________________________________________________   Discussed status with Dr. Allena Katz on 08/05/17 at 1630 and received telephone approval for admission  today.  Admission Coordinator:  Fae Pippin, time 1630/Date 08/05/17         Revision History

## 2017-08-06 NOTE — Plan of Care (Signed)
  Problem: Consults Goal: RH GENERAL PATIENT EDUCATION Description See Patient Education module for education specifics. Outcome: Progressing Goal: Skin Care Protocol Initiated - if Braden Score 18 or less Description If consults are not indicated, leave blank or document N/A Outcome: Progressing Goal: Nutrition Consult-if indicated Outcome: Progressing Goal: Diabetes Guidelines if Diabetic/Glucose > 140 Description If diabetic or lab glucose is > 140 mg/dl - Initiate Diabetes/Hyperglycemia Guidelines & Document Interventions  Outcome: Progressing   Problem: Consults Goal: RH GENERAL PATIENT EDUCATION Description See Patient Education module for education specifics. Outcome: Progressing   Problem: Consults Goal: Skin Care Protocol Initiated - if Braden Score 18 or less Description If consults are not indicated, leave blank or document N/A Outcome: Progressing   Problem: Consults Goal: Nutrition Consult-if indicated Outcome: Progressing   Problem: Consults Goal: Diabetes Guidelines if Diabetic/Glucose > 140 Description If diabetic or lab glucose is > 140 mg/dl - Initiate Diabetes/Hyperglycemia Guidelines & Document Interventions  Outcome: Progressing   Problem: RH BOWEL ELIMINATION Goal: RH STG MANAGE BOWEL WITH ASSISTANCE Description STG Manage Bowel with mod I Assistance.  Outcome: Progressing Goal: RH STG MANAGE BOWEL W/MEDICATION W/ASSISTANCE Description STG Manage Bowel with Medication with mod I Assistance.  Outcome: Progressing   Problem: RH BLADDER ELIMINATION Goal: RH STG MANAGE BLADDER WITH ASSISTANCE Description STG Manage Bladder With Mod I Assistance  Outcome: Progressing   Problem: RH BLADDER ELIMINATION Goal: RH STG MANAGE BLADDER WITH ASSISTANCE Description STG Manage Bladder With Mod I Assistance  Outcome: Progressing   Problem: RH SKIN INTEGRITY Goal: RH STG SKIN FREE OF INFECTION/BREAKDOWN Description Manage skin care and incision with  min A  Outcome: Progressing Goal: RH STG MAINTAIN SKIN INTEGRITY WITH ASSISTANCE Description STG Maintain Skin Integrity With min Assistance.  Outcome: Progressing Goal: RH OTHER STG SKIN INTEGRITY GOALS W/ASSIST Description Other STG Skin Integrity Goals With min Assistance.  Outcome: Progressing   Problem: RH SAFETY Goal: RH STG ADHERE TO SAFETY PRECAUTIONS W/ASSISTANCE/DEVICE Description STG Adhere to Safety Precautions With mod I Assistance/Device.  Outcome: Progressing Goal: RH STG DECREASED RISK OF FALL WITH ASSISTANCE Description STG Decreased Risk of Fall With Mod I  Assistance.  Outcome: Progressing   Problem: RH PAIN MANAGEMENT Goal: RH STG PAIN MANAGED AT OR BELOW PT'S PAIN GOAL Description With Min A  Outcome: Progressing Goal: RH OTHER STG PAIN MANAGEMENT GOALS W/ASSIST Description Other STG Pain Management Goals With Mod I  Assistance.  Outcome: Progressing   Problem: RH PAIN MANAGEMENT Goal: RH STG PAIN MANAGED AT OR BELOW PT'S PAIN GOAL Description With Min A  Outcome: Progressing Goal: RH OTHER STG PAIN MANAGEMENT GOALS W/ASSIST Description Other STG Pain Management Goals With Mod I  Assistance.  Outcome: Progressing   Problem: RH KNOWLEDGE DEFICIT GENERAL Goal: RH STG INCREASE KNOWLEDGE OF SELF CARE AFTER HOSPITALIZATION Outcome: Progressing

## 2017-08-06 NOTE — Plan of Care (Signed)
  Problem: Consults Goal: RH GENERAL PATIENT EDUCATION Description See Patient Education module for education specifics. Outcome: Progressing Goal: Skin Care Protocol Initiated - if Braden Score 18 or less Description If consults are not indicated, leave blank or document N/A Outcome: Progressing Goal: Nutrition Consult-if indicated Outcome: Progressing Goal: Diabetes Guidelines if Diabetic/Glucose > 140 Description If diabetic or lab glucose is > 140 mg/dl - Initiate Diabetes/Hyperglycemia Guidelines & Document Interventions  Outcome: Progressing   Problem: RH BOWEL ELIMINATION Goal: RH STG MANAGE BOWEL WITH ASSISTANCE Description STG Manage Bowel with mod I Assistance.  Outcome: Progressing Goal: RH STG MANAGE BOWEL W/MEDICATION W/ASSISTANCE Description STG Manage Bowel with Medication with mod I Assistance.  Outcome: Progressing   Problem: RH BLADDER ELIMINATION Goal: RH STG MANAGE BLADDER WITH ASSISTANCE Description STG Manage Bladder With Mod I Assistance  Outcome: Progressing   Problem: RH SKIN INTEGRITY Goal: RH STG SKIN FREE OF INFECTION/BREAKDOWN Description Manage skin care and incision with min A  Outcome: Progressing Goal: RH STG MAINTAIN SKIN INTEGRITY WITH ASSISTANCE Description STG Maintain Skin Integrity With min Assistance.  Outcome: Progressing Goal: RH OTHER STG SKIN INTEGRITY GOALS W/ASSIST Description Other STG Skin Integrity Goals With min Assistance.  Outcome: Progressing   Problem: RH SAFETY Goal: RH STG ADHERE TO SAFETY PRECAUTIONS W/ASSISTANCE/DEVICE Description STG Adhere to Safety Precautions With mod I Assistance/Device.  Outcome: Progressing Goal: RH STG DECREASED RISK OF FALL WITH ASSISTANCE Description STG Decreased Risk of Fall With Mod I  Assistance.  Outcome: Progressing   Problem: RH PAIN MANAGEMENT Goal: RH STG PAIN MANAGED AT OR BELOW PT'S PAIN GOAL Description With Min A  Outcome: Progressing Goal: RH OTHER STG PAIN  MANAGEMENT GOALS W/ASSIST Description Other STG Pain Management Goals With Mod I  Assistance.  Outcome: Progressing   Problem: RH KNOWLEDGE DEFICIT GENERAL Goal: RH STG INCREASE KNOWLEDGE OF SELF CARE AFTER HOSPITALIZATION Outcome: Progressing   

## 2017-08-06 NOTE — Evaluation (Signed)
Physical Therapy Assessment and Plan  Patient Details  Name: Scott Lane MRN: [id number] Date of Birth: 1985-07-20  PT Diagnosis: Difficulty walking, Impaired sensation, Muscle weakness, Paraplegia and Pain in joint Rehab Potential: Good ELOS: 5-9 days   Today's Date: 08/06/2017 PT Individual Time: 1300-1400 PT Individual Time Calculation (min): 60 min    Problem List:  Patient Active Problem List   Diagnosis Date Noted  . Elevated blood pressure reading   . Thoracic myelopathy 08/05/2017  . Neuropathic pain   . Muscle spasm   . Drug induced constipation   . Urinary retention   . Class 1 obesity due to excess calories with serious comorbidity and body mass index (BMI) of 33.0 to 33.9 in adult   . Myelopathy ([place]) 08/01/2017  . Thoracic spinal stenosis 07/31/2017  . Obesity (BMI 30.0-34.9) 05/17/2017    Past Medical History:  Past Medical History:  Diagnosis Date  . Chicken pox    Past Surgical History:  Past Surgical History:  Procedure Laterality Date  . LUMBAR LAMINECTOMY/DECOMPRESSION MICRODISCECTOMY N/A 08/01/2017   Procedure: Thoracic nine, ten, eleven, twelve laminectomy;  Surgeon: Eustace Moore, MD;  Location: [facility];  Service: Neurosurgery;  Laterality: N/A;  . none      Assessment & Plan Clinical Impression: Patient is a 32 y.o. year old right-handed male with unremarkable past medical history on no prescription medications.  Per chart review and patient, patient lives with spouse.  Independent prior to admission.  2 level home.  Spouse works during the day.  Presented 07/31/2017 with persistent lower extremity weakness and numbness after throwing a golf ball when he felt an electric shock down his legs that persisted over 2-1/2 weeks.  MRI reviewed, showing thoracic spinal stenosis.  Per report, severe spinal stenosis at T9-12 with cord edema.  Underwent decompressive thoracic laminectomy, medial facetectomy T9-10 and T10-11, T11-12 08/01/2017 per Dr. Sherley Bounds.   Hospital course pain management.  No back brace required with routine back precautions directed.  Decadron protocol as advised.  Bouts of constipation with laxative assistance as needed.  Patient was some difficulty with urination placed on low-dose Flomax.  Physical and occupational therapy evaluations completed with recommendations of physical medicine rehab consult. Patient transferred to CIR on 08/05/2017 .   Patient currently requires min to mod assist with mobility secondary to muscle weakness and muscle joint tightness, decreased cardiorespiratoy endurance, impaired timing and sequencing and decreased coordination and decreased standing balance, decreased postural control, decreased balance strategies and difficulty maintaining precautions.  Prior to hospitalization, patient was independent  with mobility and lived with Spouse in a House home.  Home access is 1Stairs to enter.  Patient will benefit from skilled PT intervention to maximize safe functional mobility, minimize fall risk and decrease caregiver burden for planned discharge home with intermittent assist.  Anticipate patient will benefit from follow up OP at discharge.  PT Assessment Rehab Potential (ACUTE/IP ONLY): Good PT Patient demonstrates impairments in the following area(s): Balance;Motor;Pain;Sensory PT Transfers Functional Problem(s): Bed Mobility;Bed to Chair;Car;Furniture PT Locomotion Functional Problem(s): Ambulation;Stairs PT Plan PT Intensity: Minimum of 1-2 x/day ,45 to 90 minutes PT Frequency: 5 out of 7 days PT Duration Estimated Length of Stay: 5-9 days PT Treatment/Interventions: Ambulation/gait training;Balance/vestibular training;Community reintegration;Disease management/prevention;Discharge planning;DME/adaptive equipment instruction;Functional electrical stimulation;Functional mobility training;Neuromuscular re-education;Patient/family education;Pain management;Psychosocial support;Skin care/wound  management;Splinting/orthotics;Stair training;Therapeutic Activities;Therapeutic Exercise;UE/LE Coordination activities;UE/LE Strength taining/ROM;Wheelchair propulsion/positioning PT Transfers Anticipated Outcome(s): mod I overall PT Locomotion Anticipated Outcome(s): mod I gait with LRAD; supervision stairs PT Recommendation  Recommendations for Other Services: Therapeutic Recreation consult Therapeutic Recreation Interventions: Outing/community reintergration Follow Up Recommendations: Outpatient PT Patient destination: Home Equipment Recommended: To be determined(may need RW)  Skilled Therapeutic Intervention Evaluation completed (see details above and below) with education on PT POC and goals and individual treatment initiated with focus on functional transfers and mobility with and without AD, NMR for balance re-training and assessment without use of AD to address postural control and BLE motor control and coordination, stair negotiation with bilateral rails and progressing to single rail on R to simulate home environment, education in d/c planning and overall safety/fall risk, mobility in ADL apartment to simulate home environment, and overall endurance. Pt overall min assist with use of RW and UE support during mobility but requires up to mod assist without AD and demonstrates impaired balance reactions, postural control, and LLE coordination due to impaired proprioception and weakness.   PT Evaluation Precautions/Restrictions Precautions Precautions: Back;Fall Precaution Comments: pt able to verbalize 3/3 back precautions; cues to adhere to functionally Restrictions Weight Bearing Restrictions: No Pain Denies pain currently. Home Living/Prior Functioning Home Living Available Help at Discharge: Family;Available PRN/intermittently(wife works) Type of Home: House Home Access: Stairs to enter Technical brewer of Steps: 1 Home Layout: Two level;1/2 bath on main level;Bed/bath  upstairs Alternate Level Stairs-Number of Steps: 15 steps to second floor Alternate Level Stairs-Rails: Right Bathroom Shower/Tub: Tub/shower unit  Lives With: Spouse Prior Function Level of Independence: Independent with basic ADLs;Independent with gait;Independent with transfers;Independent with homemaking with ambulation  Able to Take Stairs?: Yes Driving: Yes Vocation: Full time employment Comments: has first child due in August; 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, engineering: Within Nitro: Intact  Cognition Overall Cognitive Status: Within Functional Limits for tasks assessed Safety/Judgment: Appears intact Sensation Sensation Light Touch: Appears Intact Proprioception: Impaired Detail Proprioception Impaired Details: Impaired LLE Additional Comments: reports some tingling in BLE  Coordination Gross Motor Movements are Fluid and Coordinated: No(UEs intact; BLE impaired motor coordination) Fine Motor Movements are Fluid and Coordinated: Yes Coordination and Movement Description: impaired LLE > RLE Motor  Motor Motor: Paraplegia;Abnormal postural alignment and control;Motor impersistence Motor - Skilled Clinical Observations: LLE more impaired than RLE  Mobility Transfers Sit to Stand: 4: Min assist;3: Mod assist;Without upper extremity assist Locomotion  Ambulation Ambulation: Yes Ambulation/Gait Assistance: 3: Mod assist Ambulation Distance (Feet): 15 Feet Assistive device: None Ambulation/Gait Assistance Details: Tactile cues for weight shifting;Verbal cues for gait pattern;Verbal cues for precautions/safety Stairs / Additional Locomotion Ramp: 4: Min assist  Trunk/Postural Assessment  Cervical Assessment Cervical Assessment: Within Functional Limits Thoracic Assessment Thoracic Assessment: (back precautions) Lumbar Assessment Lumbar Assessment: (back precautions) Postural Control Postural Control:  Deficits on evaluation  Balance Balance Balance Assessed: Yes Static Sitting Balance Static Sitting - Level of Assistance: 5: Stand by assistance Dynamic Sitting Balance Dynamic Sitting - Level of Assistance: 5: Stand by assistance Static Standing Balance Static Standing - Level of Assistance: 4: Min assist Dynamic Standing Balance Dynamic Standing - Level of Assistance: 3: Mod assist Extremity Assessment   see OT eval for UE assessment   RLE Assessment RLE Assessment: Exceptions to [facility] RLE Strength RLE Overall Strength Comments: 4-/5 grossly; proximal weakness greater than distal LLE Assessment LLE Assessment: Exceptions to [facility] LLE Strength LLE Overall Strength Comments: 3/5 ankle DF, 4-/5 PF, 4-/5 knee flexion; 3+/5 knee extension;  3+/5 hip flexion   See Function Navigator for Current Functional Status.   Refer to Care Plan for Long Term Goals  Recommendations for other services: Therapeutic Recreation  Outing/community reintegration  Discharge Criteria: Patient will be discharged from PT if patient refuses treatment 3 consecutive times without medical reason, if treatment goals not met, if there is a change in medical status, if patient makes no progress towards goals or if patient is discharged from hospital.  The above assessment, treatment plan, treatment alternatives and goals were discussed and mutually agreed upon: by patient  Juanna Cao, PT, DPT  08/06/2017, 3:41 PM

## 2017-08-06 NOTE — Progress Notes (Signed)
Patient information reviewed and entered into e[facility] system by Sanya Kobrin, RN, CRRN, PPS Coordinator.  Information including medical coding and functional independence measure will be reviewed and updated through discharge.    

## 2017-08-06 NOTE — Progress Notes (Signed)
Physical Medicine and Rehabilitation Consult Reason for Consult:Lower ext weakness Referring Physician: Yetta Barre   HPI: Scott Lane is a 32 y.o. male admitted on 4/24 with persistent LE weakness after feeling an "electric shock" sensation run down his legs, when he threw a golf ball 2.5 weeks prior. [facility] MRI revealed severe spinal stenosis at T9-T12 with cord edema. Pt underwent thoracic decompression on 4/25 by Dr. Marikay Alar. PM&R was consulted to assess patient for rehab needs and potential for inpatient rehab.   Review of Systems  Constitutional: Negative for fever.  HENT: Negative for congestion.   Eyes: Negative for blurred vision.  Respiratory: Negative for hemoptysis.   Cardiovascular: Negative for chest pain.  Gastrointestinal: Positive for constipation and nausea.  Genitourinary: Negative.   Musculoskeletal: Positive for back pain.  Skin: Negative for rash.  Neurological: Positive for sensory change and focal weakness.  Psychiatric/Behavioral: Negative for depression.       Past Medical History:  Diagnosis Date  . Chicken pox         Past Surgical History:  Procedure Laterality Date  . LUMBAR LAMINECTOMY/DECOMPRESSION MICRODISCECTOMY N/A 08/01/2017   Procedure: Thoracic nine, ten, eleven, twelve laminectomy;  Surgeon: Tia Alert, MD;  Location: [facility] OR;  Service: Neurosurgery;  Laterality: N/A;  . none          Family History  Problem Relation Age of Onset  . Hypertension Mother   . Hypertension Father   . Lung cancer Father        smoker  . Hypertension Brother   . Hypertension Maternal Grandmother   . Lung cancer Maternal Grandmother        non smoker  . Hypertension Paternal Grandmother    Social History:  reports that he has never smoked. He has never used smokeless tobacco. He reports that he drinks about 1.2 oz of alcohol per week. He reports that he does not use drugs. Allergies: No Known Allergies       Medications Prior to  Admission  Medication Sig Dispense Refill  . ibuprofen (ADVIL,MOTRIN) 200 MG tablet Take 200 mg by mouth 3 (three) times daily.    . multivitamin (ONE-A-DAY MEN'S) TABS tablet Take 1 tablet by mouth daily.      Home: Home Living Family/patient expects to be discharged to:: Private residence Living Arrangements: Spouse/significant other Available Help at Discharge: Family, Available PRN/intermittently Type of Home: House Home Access: Stairs to enter Secretary/administrator of Steps: 1 Entrance Stairs-Rails: Right, Left Home Layout: Two level Alternate Level Stairs-Number of Steps: flight  Alternate Level Stairs-Rails: Right Bathroom Shower/Tub: Engineer, manufacturing systems: Standard Home Equipment: None Additional Comments: other family may be able to assist if 24hr supervision needed   Functional History: Prior Function Level of Independence: Independent Functional Status:  Mobility: Bed Mobility Overal bed mobility: Needs Assistance Bed Mobility: Rolling, Sidelying to Sit Rolling: Min assist Sidelying to sit: Mod assist, +2 for safety/equipment General bed mobility comments: verbal cues for correct log roll technique; assist to guide Pt while rolling to maintain alignment; assist to elevate trunk into sitting Transfers Overall transfer level: Needs assistance Equipment used: Rolling walker (2 wheeled) Transfers: Sit to/from Stand Sit to Stand: Min assist, +2 safety/equipment General transfer comment: MinA from elevated EOB; VCs for safe hand placement  Ambulation/Gait Ambulation/Gait assistance: Min assist Ambulation Distance (Feet): 120 Feet Assistive device: Rolling walker (2 wheeled) Gait Pattern/deviations: Step-through pattern General Gait Details: pt with mild unsteadiness, more left LE than right.  Uncoordinated heel/toe pattern  and moderately heavy use of the RW  ADL: ADL Overall ADL's : Needs assistance/impaired Eating/Feeding: Modified independent,  Sitting Grooming: Min guard, Minimal assistance, Standing Upper Body Bathing: Min guard, Sitting Lower Body Bathing: Moderate assistance, Sit to/from stand Upper Body Dressing : Min guard, Sitting Lower Body Dressing: Maximal assistance, Sit to/from stand Toilet Transfer: Minimal assistance, Ambulation, BSC, RW Toilet Transfer Details (indicate cue type and reason): BSC over toilet; simulated in transfer to recliner  Toileting- Clothing Manipulation and Hygiene: Minimal assistance, Sit to/from stand Functional mobility during ADLs: Minimal assistance, Min guard, Rolling walker General ADL Comments: pt completing room and hallway functional mobility using RW with MinGuard-MinA (+2 for safety this session) educated on adherence to back precautions while completing LB ADLs, will need to practice/review in additional tx sessions   Cognition: Cognition Overall Cognitive Status: Within Functional Limits for tasks assessed Orientation Level: Oriented X4 Cognition Arousal/Alertness: Awake/alert Behavior During Therapy: WFL for tasks assessed/performed Overall Cognitive Status: Within Functional Limits for tasks assessed  Blood pressure 118/63, pulse 80, temperature 99.3 F (37.4 C), temperature source Oral, resp. rate 18, height 6' (1.829 m), weight 111 kg (244 lb 11.4 oz), SpO2 100 %. Physical Exam  Constitutional: He is oriented to person, place, and time. No distress.  HENT:  Head: Atraumatic.  Eyes: EOM are normal.  Neck: Neck supple.  Cardiovascular: Normal rate.  Respiratory: Effort normal.  GI: He exhibits distension.  Musculoskeletal:  Low back TTP  Neurological: He is alert and oriented to person, place, and time.  UE motor 5/5.LLE 3- to 3/5 prox to distal. RLE 3+ to 4-/5. Decreased LT left lower leg.   Psychiatric: He has a normal mood and affect. His behavior is normal.    No results found for this or any previous visit (from the past 24 hour(s)). Ct Thoracic Spine Wo  Contrast  Result Date: 08/01/2017 CLINICAL DATA:  Bilateral lower extremity weakness after throwing a ball, felt a pop. Leg weakness. EXAM: CT THORACIC SPINE WITHOUT CONTRAST TECHNIQUE: Multidetector CT images of the thoracic were obtained using the standard protocol without intravenous contrast. COMPARISON:  MRI thoracic spine July 31, 2017 FINDINGS: ALIGNMENT: Maintained thoracic lordosis. No malalignment. VERTEBRAE: Vertebral bodies and posterior elements are intact. Intervertebral disc heights preserved, multilevel mild ventral endplate spurring. No destructive bony lesions. Congenital canal stenosis. PARASPINAL AND OTHER SOFT TISSUES: Included prevertebral and paraspinal soft tissues are normal. DISC LEVELS: Multilevel calcified ligamentum flavum superimposed on congenital canal narrowing. Moderate canal stenosis T1-2, severe canal stenosis T2-3, moderate canal stenosis T3-4, T4-5, mild T5-6, mild T6-7, mild T7-8 with narrowed RIGHT lateral recess. Severe canal stenosis T9-10, T10-11 and T11-12. Severe neural foraminal narrowing at all levels varying from RIGHT to LEFT. IMPRESSION: 1. No fracture or malalignment. 2. Multilevel calcified ligamentum flavum superimposed on congenital canal narrowing. 3. Severe canal stenosis T2-3, T9-10 through T11-12. 4. Multilevel severe neural foraminal narrowing. Electronically Signed   By: Awilda Metro M.D.   On: 08/01/2017 00:25   Mr Cervical Spine Wo Contrast  Result Date: 08/01/2017 CLINICAL DATA:  Bilateral lower extremity weakness worsening for 2 weeks after throwing a golf ball. EXAM: MRI CERVICAL SPINE WITHOUT CONTRAST TECHNIQUE: Multiplanar, multisequence MR imaging of the cervical spine was performed. No intravenous contrast was administered. COMPARISON:  MRI and CT thoracic spine July 31, 2017. FINDINGS: ALIGNMENT: Straightened cervical lordosis.  No malalignment. VERTEBRAE/DISCS: Vertebral bodies are intact. Intervertebral disc morphology's and  signal are normal. Mild chronic discogenic endplate changes C5-6. No abnormal or acute  bone marrow signal. Mild congenital canal narrowing,, less significant than thoracic spine. CORD:Cervical spinal cord is normal morphology and signal characteristics from the cervicomedullary junction to level of T2-3, the most caudal well visualized level. POSTERIOR FOSSA, VERTEBRAL ARTERIES, PARASPINAL TISSUES: No MR findings of ligamentous injury. Vertebral artery flow voids present. Included posterior fossa and paraspinal soft tissues are normal. Mild platybasia. DISC LEVELS (mildly motion degraded axial sequences): C2-3: No disc bulge, canal stenosis nor neural foraminal narrowing. C3-4: Tiny broad-based central disc protrusion. Minimal canal stenosis. No neural foraminal narrowing. C4-5: Small broad-based central disc protrusion. Minimal canal stenosis. No neural foraminal narrowing C5-6: Small broad-based central disc protrusion and annular fissure. Minimal canal stenosis. No neural foraminal narrowing. C6-7 and C7-T1: No disc bulge, canal stenosis nor neural foraminal narrowing. IMPRESSION: 1. Congenital canal narrowing. No fracture, malalignment or acute osseous process. 2. Minimal canal stenosis C3-4 through C5-6. No neural foraminal narrowing. Electronically Signed   By: Awilda Metro M.D.   On: 08/01/2017 01:32   Mr Thoracic Spine Wo Contrast  Result Date: 07/31/2017 CLINICAL DATA:  The patient felt a pop in his legs with onset of inability to move them after throwing a golf ball today. Numbness. EXAM: MRI THORACIC AND LUMBAR SPINE WITHOUT CONTRAST TECHNIQUE: Multiplanar and multiecho pulse sequences of the thoracic and lumbar spine were obtained without intravenous contrast. COMPARISON:  None. FINDINGS: MRI THORACIC SPINE FINDINGS Alignment:  Maintained. Vertebrae: Height and signal are normal. This patient has a congenitally narrow central spinal canal due to short pedicle length throughout. Cord: There  appears to be edema within the distal cord at the inferior margin at T10-11 just superior to the disc interspace. There may also be edema within the cord at T11-12 Paraspinal and other soft tissues: Negative. Disc levels: C7-T1: Negative. T1-2: There is ligamentum flavum thickening and a minimal disc bulge but the central spinal canal and foramina appear open. T1-2: Shallow bulge to the left effaces the ventral thecal sac. The foramina appear open. T2-3: There is ligamentum flavum thickening and a disc bulge. The cord is markedly deformed with a triangular configuration identified. Marked left foraminal narrowing is seen. T3-4: Shallow disc bulge effaces the ventral thecal sac. The foramina appear open. T4-5: Shallow disc bulge without stenosis. T5-6: Negative. T6-7: Ligamentum flavum thickening. No disc bulge or protrusion. The central canal and foramina are open. T7-8: Ligamentum flavum thickening.  Otherwise negative. T8-9: Ligamentum flavum thickening.  Otherwise negative. T9-10: There is ligamentum flavum thickening and a shallow disc bulge. Moderate central canal narrowing is present. The foramina appear open. T10-11: There is bulky ligamentum flavum thickening and a shallow disc bulge. There is severe central canal stenosis and marked flattening of the cord. Severe bilateral foraminal narrowing is also seen. T11-12: Bulky ligamentum flavum thickening. No disc bulge. The cord is deformed. The foramina are narrowed bilaterally. MRI LUMBAR SPINE FINDINGS Segmentation:  Standard. Alignment:  Maintained. Vertebrae: No fracture or worrisome lesion. Congenitally narrow central canal due to short pedicle length noted. Conus medullaris and cauda equina: Conus extends to the L1 level. Conus and cauda equina appear normal. Paraspinal and other soft tissues: Negative. Disc levels: L1-2 is imaged in the sagittal plane only. There is a shallow disc bulge but the central canal and foramina appear open. L2-3: Negative. L3-4:  Shallow disc bulge and ligamentum flavum thickening. The central canal and foramina remain open. There is some facet arthropathy. L4-5: Broad-based right paracentral disc protrusion causes narrowing in the right subarticular recess and impingement on  the descending right L5 root. The ventral thecal sac is deformed by disc. Disc also results in mild to moderate right foraminal narrowing. The left foramen is open. L5-S1: Shallow disc bulge with an annular fissure. The central canal is open. Moderate to moderately severe bilateral foraminal narrowing is present. IMPRESSION: MR THORACIC SPINE IMPRESSION Marked congenital narrowing of the central canal throughout due to short pedicle length. Severe central canal stenosis at T9-10 due to a shallow disc bulge and bulky ligamentum flavum thickening. There appears to be edema within the cord at this level. Marked central canal stenosis at T11-12 where there is bulky ligamentum flavum thickening. There appears to be edema within the cord at this level. Ligamentum flavum thickening and a shallow disc bulge at T2-3 cause moderately severe to severe central canal stenosis. The cord has an abnormal triangular configuration. No edema within the cord is seen at this level. There is also left foraminal narrowing at this level. MR LUMBAR SPINE IMPRESSION Congenitally narrow central spinal canal throughout. Broad-based right paracentral protrusion at L4-5 impinges on the descending right L5 root and causes moderate right foraminal narrowing. Moderate to moderately severe bilateral foraminal narrowing L5-S1. Critical Value/emergent results were called by telephone at the time of interpretation on 07/31/2017 at 8:36 pm to Dr. Shaune Pollack , who verbally acknowledged these results. Electronically Signed   By: Drusilla Kanner M.D.   On: 07/31/2017 20:43   Mr Lumbar Spine Wo Contrast  Result Date: 07/31/2017 CLINICAL DATA:  The patient felt a pop in his legs with onset of inability  to move them after throwing a golf ball today. Numbness. EXAM: MRI THORACIC AND LUMBAR SPINE WITHOUT CONTRAST TECHNIQUE: Multiplanar and multiecho pulse sequences of the thoracic and lumbar spine were obtained without intravenous contrast. COMPARISON:  None. FINDINGS: MRI THORACIC SPINE FINDINGS Alignment:  Maintained. Vertebrae: Height and signal are normal. This patient has a congenitally narrow central spinal canal due to short pedicle length throughout. Cord: There appears to be edema within the distal cord at the inferior margin at T10-11 just superior to the disc interspace. There may also be edema within the cord at T11-12 Paraspinal and other soft tissues: Negative. Disc levels: C7-T1: Negative. T1-2: There is ligamentum flavum thickening and a minimal disc bulge but the central spinal canal and foramina appear open. T1-2: Shallow bulge to the left effaces the ventral thecal sac. The foramina appear open. T2-3: There is ligamentum flavum thickening and a disc bulge. The cord is markedly deformed with a triangular configuration identified. Marked left foraminal narrowing is seen. T3-4: Shallow disc bulge effaces the ventral thecal sac. The foramina appear open. T4-5: Shallow disc bulge without stenosis. T5-6: Negative. T6-7: Ligamentum flavum thickening. No disc bulge or protrusion. The central canal and foramina are open. T7-8: Ligamentum flavum thickening.  Otherwise negative. T8-9: Ligamentum flavum thickening.  Otherwise negative. T9-10: There is ligamentum flavum thickening and a shallow disc bulge. Moderate central canal narrowing is present. The foramina appear open. T10-11: There is bulky ligamentum flavum thickening and a shallow disc bulge. There is severe central canal stenosis and marked flattening of the cord. Severe bilateral foraminal narrowing is also seen. T11-12: Bulky ligamentum flavum thickening. No disc bulge. The cord is deformed. The foramina are narrowed bilaterally. MRI LUMBAR SPINE  FINDINGS Segmentation:  Standard. Alignment:  Maintained. Vertebrae: No fracture or worrisome lesion. Congenitally narrow central canal due to short pedicle length noted. Conus medullaris and cauda equina: Conus extends to the L1 level. Conus and cauda  equina appear normal. Paraspinal and other soft tissues: Negative. Disc levels: L1-2 is imaged in the sagittal plane only. There is a shallow disc bulge but the central canal and foramina appear open. L2-3: Negative. L3-4: Shallow disc bulge and ligamentum flavum thickening. The central canal and foramina remain open. There is some facet arthropathy. L4-5: Broad-based right paracentral disc protrusion causes narrowing in the right subarticular recess and impingement on the descending right L5 root. The ventral thecal sac is deformed by disc. Disc also results in mild to moderate right foraminal narrowing. The left foramen is open. L5-S1: Shallow disc bulge with an annular fissure. The central canal is open. Moderate to moderately severe bilateral foraminal narrowing is present. IMPRESSION: MR THORACIC SPINE IMPRESSION Marked congenital narrowing of the central canal throughout due to short pedicle length. Severe central canal stenosis at T9-10 due to a shallow disc bulge and bulky ligamentum flavum thickening. There appears to be edema within the cord at this level. Marked central canal stenosis at T11-12 where there is bulky ligamentum flavum thickening. There appears to be edema within the cord at this level. Ligamentum flavum thickening and a shallow disc bulge at T2-3 cause moderately severe to severe central canal stenosis. The cord has an abnormal triangular configuration. No edema within the cord is seen at this level. There is also left foraminal narrowing at this level. MR LUMBAR SPINE IMPRESSION Congenitally narrow central spinal canal throughout. Broad-based right paracentral protrusion at L4-5 impinges on the descending right L5 root and causes moderate  right foraminal narrowing. Moderate to moderately severe bilateral foraminal narrowing L5-S1. Critical Value/emergent results were called by telephone at the time of interpretation on 07/31/2017 at 8:36 pm to Dr. Shaune Pollack , who verbally acknowledged these results. Electronically Signed   By: Drusilla Kanner M.D.   On: 07/31/2017 20:43   Dg Thoracic Spine 1 View  Result Date: 08/01/2017 CLINICAL DATA:  LOCALIZATION IMAGING: Thoracic nine, ten, eleven, twelve laminectomy RSTO: BAP/[facility] FLUORO TIME: 8 SECS EXAM: DG C-ARM 61-120 MIN; THORACIC SPINE - 1 VIEW COMPARISON:  CT 07/31/2017 FINDINGS: Posterior retractor is in place. Surgical instruments overlie the thoracic spine, levels not distinguishable on this single image. IMPRESSION: Intraoperative localization. Electronically Signed   By: Norva Pavlov M.D.   On: 08/01/2017 19:55   Dg C-arm 1-60 Min  Result Date: 08/01/2017 CLINICAL DATA:  LOCALIZATION IMAGING: Thoracic nine, ten, eleven, twelve laminectomy RSTO: BAP/[facility] FLUORO TIME: 8 SECS EXAM: DG C-ARM 61-120 MIN; THORACIC SPINE - 1 VIEW COMPARISON:  CT 07/31/2017 FINDINGS: Posterior retractor is in place. Surgical instruments overlie the thoracic spine, levels not distinguishable on this single image. IMPRESSION: Intraoperative localization. Electronically Signed   By: Norva Pavlov M.D.   On: 08/01/2017 19:55    Assessment/Plan: Diagnosis: Thoracic stenosis with myelopathy 1. Does the need for close, 24 hr/day medical supervision in concert with the patient's rehab needs make it unreasonable for this patient to be served in a less intensive setting? Yes 2. Co-Morbidities requiring supervision/potential complications: constipation/neuorgenic bowel. ?neurogenic bladder?, pain control 3. Due to bladder management, bowel management, safety, skin/wound care, disease management, medication administration, pain management and patient education, does the patient require 24 hr/day rehab  nursing? Yes 4. Does the patient require coordinated care of a physician, rehab nurse, PT (1-2 hrs/day, 5 days/week) and OT (1-2 hrs/day, 5 days/week) to address physical and functional deficits in the context of the above medical diagnosis(es)? Yes Addressing deficits in the following areas: balance, endurance, locomotion, strength, transferring, bowel/bladder  control, bathing, dressing, feeding, grooming, toileting and psychosocial support 5. Can the patient actively participate in an intensive therapy program of at least 3 hrs of therapy per day at least 5 days per week? Yes 6. The potential for patient to make measurable gains while on inpatient rehab is excellent 7. Anticipated functional outcomes upon discharge from inpatient rehab are modified independent  with PT, modified independent with OT, n/a with SLP. 8. Estimated rehab length of stay to reach the above functional goals is: 7 days 9. Anticipated D/C setting: Home 10. Anticipated post D/C treatments: HH therapy and Outpatient therapy 11. Overall Rehab/Functional Prognosis: excellent  RECOMMENDATIONS: This patient's condition is appropriate for continued rehabilitative care in the following setting: CIR Patient has agreed to participate in recommended program. Yes Note that insurance prior authorization may be required for reimbursement for recommended care.  Comment: Rehab Admissions Coordinator to follow up.  Thanks,  Ranelle Oyster, MD, [place] [facility]     Ranelle Oyster, MD 08/02/2017           Routing History

## 2017-08-06 NOTE — Progress Notes (Signed)
Occupational Therapy Session Note  Patient Details  Name: CAIUS SILBERNAGEL MRN: [id number] Date of Birth: Sep 28, 1985  Today's Date: 08/06/2017 OT Individual Time: 1000-1100 OT Individual Time Calculation (min): 60 min    Short Term Goals: Week 1:  OT Short Term Goal 1 (Week 1): STGs equal to LTGs set at modified independent overall.  Skilled Therapeutic Interventions/Progress Updates:    1:1 Therapeutic activities with focus on sit to stands, standing balance, and dynamic balance activities to promote trunk and LEs strengthening and conditioning. Pt with significant core , hip and left knee weakness. Sit to stands with and without UE support, weight shifts forward/ backwards and laterally, top tapping on 4 inch block, side stepping with and without resistance. Pt very motivated. Discussed d/c planning and goals.   Therapy Documentation Precautions:  Precautions Precautions: Back, Fall Restrictions Weight Bearing Restrictions: No Pain: Pain Assessment Pain Scale: 0-10 Pain Score: 0-No pain  See Function Navigator for Current Functional Status.   Therapy/Group: Individual Therapy  Roney Mans [facility] 08/06/2017, 3:05 PM

## 2017-08-07 ENCOUNTER — Inpatient Hospital Stay (HOSPITAL_COMMUNITY): Payer: 59 | Admitting: Physical Therapy

## 2017-08-07 ENCOUNTER — Encounter (HOSPITAL_COMMUNITY): Payer: 59 | Admitting: Psychology

## 2017-08-07 ENCOUNTER — Inpatient Hospital Stay (HOSPITAL_COMMUNITY): Payer: 59 | Admitting: Occupational Therapy

## 2017-08-07 DIAGNOSIS — D72829 Elevated white blood cell count, unspecified: Secondary | ICD-10-CM

## 2017-08-07 DIAGNOSIS — F411 Generalized anxiety disorder: Secondary | ICD-10-CM

## 2017-08-07 MED ORDER — SENNA 8.6 MG PO TABS
2.0000 | ORAL_TABLET | Freq: Two times a day (BID) | ORAL | Status: DC
  Administered 2017-08-07 – 2017-08-12 (×9): 17.2 mg via ORAL
  Filled 2017-08-07 (×10): qty 2

## 2017-08-07 NOTE — Plan of Care (Signed)
  Problem: Consults Goal: RH GENERAL PATIENT EDUCATION Description See Patient Education module for education specifics. Outcome: Progressing Goal: Skin Care Protocol Initiated - if Braden Score 18 or less Description If consults are not indicated, leave blank or document N/A Outcome: Progressing Goal: Nutrition Consult-if indicated Outcome: Progressing Goal: Diabetes Guidelines if Diabetic/Glucose > 140 Description If diabetic or lab glucose is > 140 mg/dl - Initiate Diabetes/Hyperglycemia Guidelines & Document Interventions  Outcome: Progressing   Problem: RH BOWEL ELIMINATION Goal: RH STG MANAGE BOWEL WITH ASSISTANCE Description STG Manage Bowel with mod I Assistance.  Outcome: Progressing Goal: RH STG MANAGE BOWEL W/MEDICATION W/ASSISTANCE Description STG Manage Bowel with Medication with mod I Assistance.  Outcome: Progressing   Problem: RH BLADDER ELIMINATION Goal: RH STG MANAGE BLADDER WITH ASSISTANCE Description STG Manage Bladder With Mod I Assistance  Outcome: Progressing   Problem: RH BLADDER ELIMINATION Goal: RH STG MANAGE BLADDER WITH ASSISTANCE Description STG Manage Bladder With Mod I Assistance  Outcome: Progressing   Problem: RH SKIN INTEGRITY Goal: RH STG SKIN FREE OF INFECTION/BREAKDOWN Description Manage skin care and incision with min A  Outcome: Progressing Goal: RH STG MAINTAIN SKIN INTEGRITY WITH ASSISTANCE Description STG Maintain Skin Integrity With min Assistance.  Outcome: Progressing Goal: RH OTHER STG SKIN INTEGRITY GOALS W/ASSIST Description Other STG Skin Integrity Goals With min Assistance.  Outcome: Progressing   Problem: RH SAFETY Goal: RH STG ADHERE TO SAFETY PRECAUTIONS W/ASSISTANCE/DEVICE Description STG Adhere to Safety Precautions With mod I Assistance/Device.  Outcome: Progressing Goal: RH STG DECREASED RISK OF FALL WITH ASSISTANCE Description STG Decreased Risk of Fall With Mod I  Assistance.  Outcome: Progressing   Problem: RH PAIN MANAGEMENT Goal: RH STG PAIN MANAGED AT OR BELOW PT'S PAIN GOAL Description With Min A  Outcome: Progressing Goal: RH OTHER STG PAIN MANAGEMENT GOALS W/ASSIST Description Other STG Pain Management Goals With Mod I  Assistance.  Outcome: Progressing   Problem: RH KNOWLEDGE DEFICIT GENERAL Goal: RH STG INCREASE KNOWLEDGE OF SELF CARE AFTER HOSPITALIZATION Outcome: Progressing

## 2017-08-07 NOTE — Progress Notes (Signed)
PHYSICAL MEDICINE & REHABILITATION     PROGRESS NOTE  Subjective/Complaints:  Patient seen ambulating from the restroom to the sink and then to his chair with a rolling walker. He states he slept well overnight.He states he hopes to DC the walker today. He notes he did 12 steps in therapies yesterday.  ROS: denies CP, SOB, N/V/D.  Objective: Vital Signs: Blood pressure 122/66, pulse 64, temperature 97.6 F (36.4 C), temperature source Oral, resp. rate 18, height 6' (1.829 m), weight 109 kg (240 lb 4.8 oz), SpO2 99 %. No results found. Recent Labs    08/06/17 0717  WBC 10.8*  HGB 14.9  HCT 43.4  PLT 279   Recent Labs    08/06/17 0717  NA 137  K 4.0  CL 98*  GLUCOSE 89  BUN 15  CREATININE 0.92  CALCIUM 9.3   CBG (last 3)  No results for input(s): GLUCAP in the last 72 hours.  Wt Readings from Last 3 Encounters:  08/05/17 109 kg (240 lb 4.8 oz)  08/01/17 111 kg (244 lb 11.4 oz)  05/17/17 114.9 kg (253 lb 3.2 oz)    Physical Exam:  BP 122/66 (BP Location: Right Arm)   Pulse 64   Temp 97.6 F (36.4 C) (Oral)   Resp 18   Ht 6' (1.829 m)   Wt 109 kg (240 lb 4.8 oz)   SpO2 99%   BMI 32.59 kg/m  Constitutional: He appears well-developed. Obese. HENT: Normocephalic and atraumatic.  Eyes: EOM are normal. No discharge.  Cardiovascular: RRR. No JVD. Respiratory: Effort normal and breath sounds normal.  GI: Bowel sounds are normal. He exhibits no distension.  Musc: No edema or tenderness in extremities Neurological: Patient is alert and oriented.   Follows full commands.  Motor: B/l UE 5/5 proximal to distal RLE: 4+-5/5 proximal to distal (unchanged) LLE: 4-4+/5 proximal to distal (unchanged) Skin.  Surgical incision with Steri-Strips C/D/I.  Assessment/Plan: 1. Functional deficits secondary to thoracic stenosis with myelopathy s/p decompression which require 3+ hours per day of interdisciplinary therapy in a comprehensive inpatient rehab  setting. Physiatrist is providing close team supervision and 24 hour management of active medical problems listed below. Physiatrist and rehab team continue to assess barriers to discharge/monitor patient progress toward functional and medical goals.  Function:  Bathing Bathing position   Position: Shower  Bathing parts Body parts bathed by patient: Right arm, Left arm, Chest, Abdomen, Front perineal area, Buttocks, Right upper leg, Left upper leg Body parts bathed by helper: Back, Left lower leg, Right lower leg  Bathing assist        Upper Body Dressing/Undressing Upper body dressing Upper body dressing/undressing activity did not occur: N/A What is the patient wearing?: Pull over shirt/dress     Pull over shirt/dress - Perfomed by patient: Thread/unthread left sleeve, Thread/unthread right sleeve, Put head through opening, Pull shirt over trunk          Upper body assist Assist Level: Touching or steadying assistance(Pt > 75%)      Lower Body Dressing/Undressing Lower body dressing   What is the patient wearing?: Pants, Non-skid slipper socks     Pants- Performed by patient: Pull pants up/down, Fasten/unfasten pants, Thread/unthread left pants leg, Thread/unthread right pants leg Pants- Performed by helper: Thread/unthread left pants leg   Non-skid slipper socks- Performed by helper: Don/doff right sock, Don/doff left sock       Shoes - Performed by helper: Don/doff right shoe, Don/doff left shoe, Fasten  right, Fasten left          Lower body assist Assist for lower body dressing: Touching or steadying assistance (Pt > 75%)      Toileting Toileting Toileting activity did not occur: Safety/medical concerns Toileting steps completed by patient: Adjust clothing prior to toileting, Adjust clothing after toileting   Toileting Assistive Devices: Grab bar or rail  Toileting assist Assist level: More than reasonable time, Touching or steadying assistance (Pt.75%), Set  up/obtain supplies   Transfers Chair/bed transfer   Chair/bed transfer method: Stand pivot, Ambulatory Chair/bed transfer assist level: Touching or steadying assistance (Pt > 75%) Chair/bed transfer assistive device: Armrests, Walker     Locomotion Ambulation     Max distance: 15' Assist level: Moderate assist (Pt 50 - 74%)   Wheelchair          Cognition Comprehension Comprehension assist level: Follows complex conversation/direction with extra time/assistive device  Expression Expression assist level: Expresses complex ideas: With extra time/assistive device  Social Interaction Social Interaction assist level: Interacts appropriately with others - No medications needed.  Problem Solving Problem solving assist level: Solves complex problems: With extra time  Memory Memory assist level: Complete Independence: No helper    Medical Problem List and Plan: 1.  Sudden onset of leg paralysis secondary to thoracic stenosis with myelopathy.  Status post decompressive thoracic laminectomy T9-10 and T10-11, T11-12.  No back brace required  Continue CIR 2.  DVT Prophylaxis/Anticoagulation: SCDs.    Vascular study negative for DVT 3. Pain Management: Celebrex 200 mg every 12 hours, hydrocodone and Robaxin as needed 4. Mood: Provide emotional support 5. Neuropsych: This patient is capable of making decisions on his own behalf. 6. Skin/Wound Care: Routine skin checks 7. Fluids/Electrolytes/Nutrition: Routine I/O's    BMP within acceptable limits on 4/30 8.  Constipation.  Laxative assistance  BM this AM, will consider further medication adjustments if necessary  9.  Urinary retention.  Flomax 0.4 mg daily.    PVR remain pending 10.  Obesity.  BMI 33.19.  Dietary follow-up 11. Elevated BP  Appears to be improving  Monitor with increased mobility 12. Leukocytosis  WBCs 10.8 on 4/30  Afebrile  Continue to monitor  LOS (Days) 2 A FACE TO FACE EVALUATION WAS PERFORMED  Ankit Karis Juba 08/07/2017 8:05 AM

## 2017-08-07 NOTE — Progress Notes (Signed)
Occupational Therapy Session Note  Patient Details  Name: Scott Lane MRN: [id number] Date of Birth: 1986-03-05  Today's Date: 08/07/2017 OT Individual Time: 1100-1200 OT Individual Time Calculation (min): 60 min    Short Term Goals: Week 1:  OT Short Term Goal 1 (Week 1): STGs equal to LTGs set at modified independent overall.  Skilled Therapeutic Interventions/Progress Updates:    Treatment session with focus on functional mobility and ADL retraining.  Pt received supine in bed reporting desire to shower.  Ambulated to shower with RW with min guard, pt had already gathered items prior to session.  Engaged in bathing in standing with pt requiring alternating UE support on grab bars or walls to maintain standing balance during bathing.  Pt did not wash lower legs in standing.  Completed dressing from sit > stand level from recliner with mod cues for adherence to back precautions.  Pt able to recall 3/3 back precautions verbally but requires cues to adhere to functionally.  Engaged in stretch to increase ability to achieve figure 4 position to allow pt to don socks and shoes without use of AE while maintaining back precautions.  Pt ambulated to therapy gym with RW with min guard progressing to min assist as he fatigued.  Engaged in dynamic standing balance on foam surface to challenge balance reactions with min assist.  Alternating toe taps in sitting progressing to standing with UE support on RW and min assist for stability.  Pt requesting to attempt ambulation without RW, utilized quad cane ambulating 30' with mod assist with increased Lt foot drop.  Returned to room ambulating with RW with min guard faded to min assist with fatigue and increased foot drop.  Therapy Documentation Precautions:  Precautions Precautions: Back, Fall Precaution Comments: pt able to verbalize 3/3 back precautions; cues to adhere to functionally Restrictions Weight Bearing Restrictions: No Pain: Pain  Assessment Pain Scale: 0-10 Pain Score: 0-No pain  See Function Navigator for Current Functional Status.   Therapy/Group: Individual Therapy  Rosalio Loud 08/07/2017, 12:57 PM

## 2017-08-07 NOTE — Progress Notes (Signed)
Physical Therapy Session Note  Patient Details  Name: Scott Lane MRN: [id number] Date of Birth: [birth year]  Today's Date: 08/07/2017 PT Individual Time: 1305-1420 AND 1550-1700     Short Term Goals: Week 1:  PT Short Term Goal 1 (Week 1): = LTGS due to ELOS  Skilled Therapeutic Interventions/Progress Updates:   Pt received sitting in WC and agreeable to PT  Gait training to rehab gym with RW x 11f close supervision assist fading to min assit for last 557fwith increased foot drag on the L and increased circumduction on the R. Pt also instructed pt in gait training without AD x [id number] mod-max assist and multiple LOB to the L due to poor LLE coordination and trunk control. Pt performed gait training with vairous AD inluding [place] forearm crutch x [id number] with SPC x 40ft58fd assist with forearm crutch and SPC due to poor sequencing of 3 point gait pattern and poor foot clearance on the L.     dymanic balance and coordination training foot taps on 4 inch step to 1 of 2 targets with BUE support on RW x 12 BLE. Reciprocal foot taps on 6 inch step with BUE support on hand rails.  WC mobility x 200ft 84f supervision assist and min-mod cues for improved technique in turns to reduce turning radius and reduce overall energy expenditure.   Stand pivot and sit<>stand transfers throughout treatment with  Supervision assist-min assist for safety with cues for proper UE placement, set up, and anterior weight shifting.   Nustep reciprocal movement training x 12 min with min-supervision assist from PT with min cues throughout for improved LLE positioning to improved control of hip musculature and reduce pain.   Session 2.   Pt received supine in bed and agreeable to PT. Supine>sit transfer with supervision assist and min cues for safety.   Stand pivot transfer throughout treatment with supervision assist from PT as well as sit<>stand transfers with supervision assist and RW x 10.   Pt  transported to [employer] [facility]. [facility] standing balance with reaching task x 3:  Level ground program A:57 airex pad 53 Airex pad with tscope 50 and 53.  Pt noted to required increased UE support with dual task of tscpse and maintaining balance on airex pad. Close supervision assist from PT overall throughout with min assist x 2 to prevent posterior LOB  Biodex balance training  Anterior/posterior  And R/L lateral weight shifting  2 x 1 minute each with close supervision assist.  LOS x 3 low and medium difficulty with min assist for improved weight shifting over the LLE.  Maze control x2 with min assist for improved smoothness of weight shifting on low difficulty.   Gait training with RW x 19ft w59fsupervision-min assist from PT with min cues for postural control and improved step width/length. Gait training with QC x 60ft wi74fin-mod assist from PT  And cues for 3 point gait pattern.   Stair negotiation training x 12 steps with 1-2 rails. Min assist overall with 1 and 2 rail, but significant improvement in safety with 2 rails over 1. Cues fro PT for step to gait pattern to improve safety on steps.   Patient returned to room and left sitting in WC with [facility] bell in reach and all needs met.          Therapy Documentation Precautions:  Precautions Precautions: Back, Fall Precaution Comments: pt able to verbalize 3/3 back precautions; cues to adhere to  functionally Restrictions Weight Bearing Restrictions: No    Pain: Pain Assessment Pain Scale: 0-10 Pain Score: 4  Pain Type: Acute pain Pain Location: Back Pain Orientation: Mid Pain Descriptors / Indicators: Sore Pain Frequency: Intermittent Pain Onset: With Activity Patients Stated Pain Goal: 0 Pain Intervention(s): Medication (See eMAR)  See Function Navigator for Current Functional Status.   Therapy/Group: Individual Therapy  Lorie Phenix 08/07/2017, 1:18 PM

## 2017-08-07 NOTE — Plan of Care (Signed)
  Problem: Consults Goal: RH GENERAL PATIENT EDUCATION Description See Patient Education module for education specifics. Outcome: Progressing Goal: Skin Care Protocol Initiated - if Braden Score 18 or less Description If consults are not indicated, leave blank or document N/A Outcome: Progressing Goal: Nutrition Consult-if indicated Outcome: Progressing Goal: Diabetes Guidelines if Diabetic/Glucose > 140 Description If diabetic or lab glucose is > 140 mg/dl - Initiate Diabetes/Hyperglycemia Guidelines & Document Interventions  Outcome: Progressing   Problem: RH BOWEL ELIMINATION Goal: RH STG MANAGE BOWEL WITH ASSISTANCE Description STG Manage Bowel with mod I Assistance.  Outcome: Progressing Goal: RH STG MANAGE BOWEL W/MEDICATION W/ASSISTANCE Description STG Manage Bowel with Medication with mod I Assistance.  Outcome: Progressing   Problem: RH BLADDER ELIMINATION Goal: RH STG MANAGE BLADDER WITH ASSISTANCE Description STG Manage Bladder With Mod I Assistance  Outcome: Progressing   Problem: RH SKIN INTEGRITY Goal: RH STG SKIN FREE OF INFECTION/BREAKDOWN Description Manage skin care and incision with min A  Outcome: Progressing Goal: RH STG MAINTAIN SKIN INTEGRITY WITH ASSISTANCE Description STG Maintain Skin Integrity With min Assistance.  Outcome: Progressing Goal: RH OTHER STG SKIN INTEGRITY GOALS W/ASSIST Description Other STG Skin Integrity Goals With min Assistance.  Outcome: Progressing   Problem: RH SAFETY Goal: RH STG ADHERE TO SAFETY PRECAUTIONS W/ASSISTANCE/DEVICE Description STG Adhere to Safety Precautions With mod I Assistance/Device.  Outcome: Progressing Goal: RH STG DECREASED RISK OF FALL WITH ASSISTANCE Description STG Decreased Risk of Fall With Mod I  Assistance.  Outcome: Progressing   Problem: RH PAIN MANAGEMENT Goal: RH STG PAIN MANAGED AT OR BELOW PT'S PAIN GOAL Description With Min A  Outcome: Progressing Goal: RH OTHER STG PAIN  MANAGEMENT GOALS W/ASSIST Description Other STG Pain Management Goals With Mod I  Assistance.  Outcome: Progressing   Problem: RH KNOWLEDGE DEFICIT GENERAL Goal: RH STG INCREASE KNOWLEDGE OF SELF CARE AFTER HOSPITALIZATION Outcome: Progressing

## 2017-08-07 NOTE — Consult Note (Signed)
Neuropsychological Consultation   Patient:   Scott Lane   DOB:   January 25, 1986  MR Number:  [id number]  [facility]:  [facility] [address] [facility] [address] [id number] [place] [address] Dept: [phone] Loc: [phone]           Date of Service:   08/07/2017  Start Time:   8 AM End Time:   9 AM  Provider/Observer:  Arley Phenix, Psy.D.       Clinical Neuropsychologist       Billing Code/Service: 01601 4 Units  Chief Complaint:    Scott Lane is a 32 year old male with no significant prior medical history.  Presented 07/31/2017 with persistent lower extremity weakness and numbness.  Patient reports he had actually had several times over past couple years with feelings of "electric shock" going down his legs doing things like basketball.  He had increasing weakness at times over past month.  MRI reviewed showing thoracic spinal stenosis.  Severe spinal stenosis at T9-12 with cord edema.  Underwent decompressive thoracic laminectomy, medical facetectomy.  Patient now being treated in CIR program.  The patient has also had a great deal of anxiety about how he was improve and what deficits he will have going forward.  While his outward demeanor is very upbeat, there is a lot of anxiety, worry and fear about his future.     Reason for Service:  Scott Lane was referred for neuropsychological consultation due to coping and adjustment issues.   Below is the HPI for the current admission.  HPI: Scott Lane is a 32 year old right-handed male with unremarkable past medical history on no prescription medications.  Per chart review and patient, patient lives with spouse.  Independent prior to admission.  2 level home.  Spouse works during the day.  Presented 07/31/2017 with persistent lower extremity weakness and numbness after throwing a golf ball when he felt an electric shock down his legs that persisted over 2-1/2 weeks.   MRI reviewed, showing thoracic spinal stenosis.  Per report, severe spinal stenosis at T9-12 with cord edema.  Underwent decompressive thoracic laminectomy, medial facetectomy T9-10 and T10-11, T11-12 08/01/2017 per Dr. Marikay Alar.  Hospital course pain management.  No back brace required with routine back precautions directed.  Decadron protocol as advised.  Bouts of constipation with laxative assistance as needed.  Patient was some difficulty with urination placed on low-dose Flomax.  Physical and occupational therapy evaluations completed with recommendations of physical medicine rehab consult.  Patient was admitted for a comprehensive rehab program  Current Status:  While the patient remains upbeat and highly motivated for therapy, he is having a great deal of anxiety and worry about how well he will recover.  He also has worry about possible next surgery on his neck that Dr. Yetta Barre has talked to him about.    Behavioral Observation: Scott Lane  presents as a 32 y.o.-year-old Right African American Male who appeared his stated age. his dress was Appropriate and he was Well Groomed and his manners were Appropriate to the situation.  his participation was indicative of Appropriate and Attentive behaviors.  There were any physical disabilities noted.  he displayed an appropriate level of cooperation and motivation.     Interactions:    Active Appropriate and Attentive  Attention:   within normal limits and attention span and concentration were age appropriate  Memory:   within normal limits; recent and remote  memory intact  Visuo-spatial:  within normal limits  Speech (Volume):  normal  Speech:   normal; normal  Thought Process:  Coherent and Relevant  Though Content:  WNL; not suicidal and not homicidal  Orientation:   person, place, time/date and  situation  Judgment:   Good  Planning:   Good  Affect:    Appropriate  Mood:    Anxious  Insight:   Good  Intelligence:   normal   Medical History:   Past Medical History:  Diagnosis Date  . Chicken pox         Psychiatric History:  No prior psychiatric history.  Family Med/Psych History:  Family History  Problem Relation Age of Onset  . Hypertension Mother   . Hypertension Father   . Lung cancer Father        smoker  . Hypertension Brother   . Hypertension Maternal Grandmother   . Lung cancer Maternal Grandmother        non smoker  . Hypertension Paternal Grandmother     Risk of Suicide/Violence: virtually non-existent Patient denies SI or HI.  Impression/DX:  Scott Lane is a 32 year old male with no significant prior medical history.  Presented 07/31/2017 with persistent lower extremity weakness and numbness.  Patient reports he had actually had several times over past couple years with feelings of "electric shock" going down his legs doing things like basketball.  He had increasing weakness at times over past month.  MRI reviewed showing thoracic spinal stenosis.  Severe spinal stenosis at T9-12 with cord edema.  Underwent decompressive thoracic laminectomy, medical facetectomy.  Patient now being treated in CIR program.  The patient has also had a great deal of anxiety about how he was improve and what deficits he will have going forward.  While his outward demeanor is very upbeat, there is a lot of anxiety, worry and fear about his future.   While the patient remains upbeat and highly motivated for therapy, he is having a great deal of anxiety and worry about how well he will recover.  He also has worry about possible next surgery on his neck that Dr. Yetta Barre has talked to him about.    Disposition/Plan:  Today we worked on coping and adjustment issues and addressed his anxiety and worry.  Patient likely to be discharged home soon.        Electronically  Signed   _______________________ Arley Phenix, Psy.D.

## 2017-08-08 ENCOUNTER — Inpatient Hospital Stay (HOSPITAL_COMMUNITY): Payer: 59 | Admitting: Occupational Therapy

## 2017-08-08 ENCOUNTER — Inpatient Hospital Stay (HOSPITAL_COMMUNITY): Payer: 59 | Admitting: Physical Therapy

## 2017-08-08 DIAGNOSIS — G8918 Other acute postprocedural pain: Secondary | ICD-10-CM

## 2017-08-08 MED ORDER — POLYETHYLENE GLYCOL 3350 17 G PO PACK
17.0000 g | PACK | Freq: Every day | ORAL | Status: DC
  Administered 2017-08-09 – 2017-08-10 (×2): 17 g via ORAL
  Filled 2017-08-08 (×4): qty 1

## 2017-08-08 MED ORDER — ALUM & MAG HYDROXIDE-SIMETH 200-200-20 MG/5ML PO SUSP
30.0000 mL | Freq: Four times a day (QID) | ORAL | Status: DC | PRN
  Administered 2017-08-08 – 2017-08-09 (×2): 30 mL via ORAL
  Filled 2017-08-08 (×2): qty 30

## 2017-08-08 MED ORDER — HYDROCODONE-ACETAMINOPHEN 7.5-325 MG PO TABS: 1.0000 | ORAL_TABLET | Freq: Four times a day (QID) | ORAL | Status: DC | PRN

## 2017-08-08 NOTE — Care Management (Signed)
[facility] Individual Statement of Services  Patient Name:  MATTTHEW ZIOMEK  Date:  08/08/2017  Welcome to the [facility].  Our goal is to provide you with an individualized program based on your diagnosis and situation, designed to meet your specific needs.  With this comprehensive rehabilitation program, you will be expected to participate in at least 3 hours of rehabilitation therapies Monday-Friday, with modified therapy programming on the weekends.  Your rehabilitation program will include the following services:  Physical Therapy (PT), Occupational Therapy (OT), 24 hour per day rehabilitation nursing, Neuropsychology, Case Management (Social Worker), Rehabilitation Medicine, Nutrition Services and Pharmacy Services  Weekly team conferences will be held on Wednesdays to discuss your progress.  Your Social Worker will talk with you frequently to get your input and to update you on team discussions.  Team conferences with you and your family in attendance may also be held.  Expected length of stay: 5-7 days    Overall anticipated outcome: modified independent  Depending on your progress and recovery, your program may change. Your Social Worker will coordinate services and will keep you informed of any changes. Your Social Worker's name and contact numbers are listed  below.  The following services may also be recommended but are not provided by the [facility]patient [facility]:   Driving Evaluations  Home Health Rehabiltiation Services  Outpatient Rehabilitation Services  Vocational Rehabilitation   Arrangements will be made to provide these services after discharge if needed.  Arrangements include referral to agencies that provide these services.  Your insurance has been verified to be:  [facility] Your primary doctor is:  Tana Conch, MD  Pertinent information will be shared with your doctor and your insurance company.  Social Worker:  [place], [place] [phone] or [phone]   Information discussed with and copy given to patient by: Amada Jupiter, 08/08/2017, 5:30 PM

## 2017-08-08 NOTE — Progress Notes (Signed)
Physical Therapy Session Note  Patient Details  Name: Scott Lane MRN: [id number] Date of Birth: 28-Oct-1985  Today's Date: 08/08/2017 PT Individual Time:1005-1105 AND 1305-1330   60 min and 25 min   Short Term Goals: Week 1:  PT Short Term Goal 1 (Week 1): = LTGS due to ELOS  Skilled Therapeutic Interventions/Progress Updates:   Pt received standing at toilet without assist for urination and agreeable to PT. Pt reports that he ambulated to bathroom without assist. PT educated pt on need for staff to assist pt to bathroom due to increased fall risk from weakness and decreased coordination. Pt verbalizes understanding.   Gait in room with supervision assist and RW for hand hygiene at sink and access of WC in corner of room. Min cue for AD management in tight spaces.   Pt instructed in Berg balance test. Patient demonstrates increased fall risk as noted by score of  32 /56 on Berg Balance Scale; see below for details.  (<36= high risk for falls, close to 100%; 37-45 significant >80%; 46-51 moderate >50%; 52-55 lower >25%)  Gait training with RW x 184f and supervision assist overall. Pt noted to have decreased foot clearance with increased distance, but no increased assist required from PT. Variable gait training with RW for forward/backward 176fx 4, side stepping at rail [id number] with min assist from PT to prevent compensatory movement.  PT also instructed pt in gait training with SBQC x [id number] min-mod assist from PT.    Session 2.   Pt received sitting in WC and agreeable to PT  Gait training with RW and supervisoin assist to rehab and from rehab gym x [id number] min cues for increased step height.   Dynamic standing balance with 3-6 inch step under the RLE to force WB throughout LLE. Lateral L and forward reaching task with supervision/min assist with cues to utilize ankle strategy to prevent LOB.   Dynamic gait training with obstacle course with BUE support on RW; step over 1  inch obstacles and weave through 8 cones. Supervision assist from PT with min cues for AD management.    Patient returned to [facility] and [facility] and all needs met.        Therapy Documentation Precautions:  Precautions Precautions: Back, Fall Precaution Comments: pt able to verbalize 3/3 back precautions; cues to adhere to functionally Restrictions Weight Bearing Restrictions: No Pain: 0/10   See Function Navigator for Current Functional Status.   Therapy/Group: Individual Therapy  AustiLorie Phenix2019, 10:22 AM

## 2017-08-08 NOTE — Progress Notes (Signed)
Occupational Therapy Session Note  Patient Details  Name: Scott Lane MRN: [id number] Date of Birth: [birth year]  Today's Date: 08/08/2017 OT Individual Time: 4098-1191 and 1430-1530 OT Individual Time Calculation (min): 60 min and 60 min   Short Term Goals: Week 1:  OT Short Term Goal 1 (Week 1): STGs equal to LTGs set at modified independent overall.  Skilled Therapeutic Interventions/Progress Updates:    1) Treatment session with focus on dynamic standing balance and adherence to back precautions during self-care tasks.  Pt received exiting bed upon arrival, provided education regarding fall policy and requiring supervision when up OOB.  Pt completed grooming tasks in standing at sink with intermittent support by leaning against counter due to weak BLE.  Ambulated to bathroom with RW and supervision.  Pt completed toilet transfer and toileting with distant supervision.  Engaged in bathing in standing with cues to maintain back precautions (not bending) during bathing.  Pt dressed from recliner demonstrating improved ability to cross BLE to achieve figure 4 to don socks.  Pt required 3 attempts with RLE, but able to achieve position.  Donned and fastened shoes without assist.  Engaged in discussion regarding back precautions and importance of maintaining to minimize chance of reinjuring back.  Pt ambulated 77' x2 with SBQC with min increasing to mod assist with fatigue.  Pt ambulating with step together pattern.    2) Treatment session with focus on functional mobility and dynamic standing balance.  Pt received seated upright with wife present.  Discussed d/c recommendation and pt in agreement with plan  to stay until Tues to further increase independence.  Engaged in ambulation outside hospital entrance 150' with RW with focus on balance and endurance.  Noted pt with increased toe drag and compensation with hip hike >75 feet on uneven surface outside.  Discussed changes in terrain and energy  conservation strategies to increase safety and independence with community mobility upon d/c.  Ambulated again with improved stability and decreased hip hike, still requiring min guard.  Engaged in Wii balance activities with focus on dynamic balance and balance strategies.  Min guard during all balance activities on balance board and pt with intermittent UE support on RW.  Stand pivot transfer w/c > bed with min assist due to LOB to Rt during transitional movement.    Therapy Documentation Precautions:  Precautions Precautions: Back, Fall Precaution Comments: pt able to verbalize 3/3 back precautions; cues to adhere to functionally Restrictions Weight Bearing Restrictions: No Pain:  Pt with no c/o pain  See Function Navigator for Current Functional Status.   Therapy/Group: Individual Therapy  Rosalio Loud 08/08/2017, 9:44 AM

## 2017-08-08 NOTE — Progress Notes (Signed)
[facility] PHYSICAL MEDICINE & REHABILITATION     PROGRESS NOTE  Subjective/Complaints:  Patient seen sitting up at the edge of his bed this morning eating breakfast. He states he slept well overnight.He notes he had some indigestion yesterday and requests when necessary medication. Discussed discharge options with patient. He states he would like to wait until tomorrow and reevaluate.  ROS:  Denies CP, SOB, N/V/D.  Objective: Vital Signs: Blood pressure 120/80, pulse 70, temperature 98.3 F (36.8 C), temperature source Oral, resp. rate 18, height 6' (1.829 m), weight 109 kg (240 lb 4.8 oz), SpO2 99 %. No results found. Recent Labs    08/06/17 0717  WBC 10.8*  HGB 14.9  HCT 43.4  PLT 279   Recent Labs    08/06/17 0717  NA 137  K 4.0  CL 98*  GLUCOSE 89  BUN 15  CREATININE 0.92  CALCIUM 9.3   CBG (last 3)  No results for input(s): GLUCAP in the last 72 hours.  Wt Readings from Last 3 Encounters:  08/05/17 109 kg (240 lb 4.8 oz)  08/01/17 111 kg (244 lb 11.4 oz)  05/17/17 114.9 kg (253 lb 3.2 oz)    Physical Exam:  BP 120/80 (BP Location: Right Arm)   Pulse 70   Temp 98.3 F (36.8 C) (Oral)   Resp 18   Ht 6' (1.829 m)   Wt 109 kg (240 lb 4.8 oz)   SpO2 99%   BMI 32.59 kg/m  Constitutional: He appears well-developed. Obese. HENT: Normocephalic and atraumatic.  Eyes: EOM are normal. No discharge.  Cardiovascular: RRR. No JVD. Respiratory: Effort normal and breath sounds normal.  GI: Bowel sounds are normal. He exhibits no distension.  Musc: No edema or tenderness in extremities Neurological: Patient is alert and oriented.   Follows full commands.  Motor: B/l UE 5/5 proximal to distal RLE: 4+/5 proximal to distal  LLE: 4+/5 proximal to distal  Skin.  Surgical incision with Steri-Strips C/D/I.  Assessment/Plan: 1. Functional deficits secondary to thoracic stenosis with myelopathy s/p decompression which require 3+ hours per day of interdisciplinary therapy  in a comprehensive inpatient rehab setting. Physiatrist is providing close team supervision and 24 hour management of active medical problems listed below. Physiatrist and rehab team continue to assess barriers to discharge/monitor patient progress toward functional and medical goals.  Function:  Bathing Bathing position   Position: Shower  Bathing parts Body parts bathed by patient: Right arm, Left arm, Chest, Abdomen, Front perineal area, Buttocks, Right upper leg, Left upper leg Body parts bathed by helper: Back, Left lower leg, Right lower leg  Bathing assist Assist Level: Supervision or verbal cues      Upper Body Dressing/Undressing Upper body dressing Upper body dressing/undressing activity did not occur: N/A What is the patient wearing?: Pull over shirt/dress     Pull over shirt/dress - Perfomed by patient: Thread/unthread left sleeve, Thread/unthread right sleeve, Put head through opening, Pull shirt over trunk          Upper body assist Assist Level: Supervision or verbal cues      Lower Body Dressing/Undressing Lower body dressing   What is the patient wearing?: Pants, Socks, Shoes, Underwear Underwear - Performed by patient: Thread/unthread right underwear leg, Thread/unthread left underwear leg, Pull underwear up/down   Pants- Performed by patient: Pull pants up/down, Thread/unthread left pants leg, Thread/unthread right pants leg Pants- Performed by helper: Thread/unthread left pants leg   Non-skid slipper socks- Performed by helper: Don/doff right sock,  Don/doff left sock Socks - Performed by patient: Don/doff right sock, Don/doff left sock   Shoes - Performed by patient: Don/doff right shoe, Don/doff left shoe, Fasten right, Fasten left Shoes - Performed by helper: Don/doff right shoe, Don/doff left shoe, Fasten right, Fasten left          Lower body assist Assist for lower body dressing: Supervision or verbal cues, Touching or steadying assistance (Pt >  75%)      Toileting Toileting Toileting activity did not occur: Safety/medical concerns Toileting steps completed by patient: Adjust clothing prior to toileting, Adjust clothing after toileting   Toileting Assistive Devices: Grab bar or rail  Toileting assist Assist level: More than reasonable time, Touching or steadying assistance (Pt.75%), Set up/obtain supplies   Transfers Chair/bed transfer   Chair/bed transfer method: Stand pivot, Ambulatory Chair/bed transfer assist level: Touching or steadying assistance (Pt > 75%) Chair/bed transfer assistive device: Armrests, Walker     Locomotion Ambulation     Max distance: 15' Assist level: Moderate assist (Pt 50 - 74%)   Wheelchair          Cognition Comprehension Comprehension assist level: Follows complex conversation/direction with extra time/assistive device  Expression Expression assist level: Expresses complex ideas: With extra time/assistive device  Social Interaction Social Interaction assist level: Interacts appropriately with others - No medications needed.  Problem Solving Problem solving assist level: Solves complex problems: With extra time  Memory Memory assist level: Complete Independence: No helper    Medical Problem List and Plan: 1.  Sudden onset of leg paralysis secondary to thoracic stenosis with myelopathy.  Status post decompressive thoracic laminectomy T9-10 and T10-11, T11-12.  No back brace required  Continue CIR  Will speak with neurosurgery regarding steroid taper 2.  DVT Prophylaxis/Anticoagulation: SCDs.    Vascular study negative for DVT 3. Pain Management: Celebrex 200 mg every 12 hours, Robaxin as needed  Hydrocodone changed to when necessary on 5/2 4. Mood: Provide emotional support 5. Neuropsych: This patient is capable of making decisions on his own behalf. 6. Skin/Wound Care: Routine skin checks 7. Fluids/Electrolytes/Nutrition: Routine I/O's    BMP within acceptable limits on 4/30  Labs  ordered for tomorrow 8.  Constipation.  Laxative assistance  Bowel regimen increased on 5/1, and again on 5/2  9.  Urinary retention.  Flomax 0.4 mg daily.    PVR remain pending, Discussed with nursing 10.  Obesity.  BMI 33.19.  Dietary follow-up 11. Elevated BP  Resolved  Monitor with increased mobility 12. Leukocytosis  WBCs 10.8 on 4/30  Labs ordered for tomorrow  Afebrile  Continue to monitor  LOS (Days) 3 A FACE TO FACE EVALUATION WAS PERFORMED  Leandria Thier Karis Juba 08/08/2017 8:20 AM

## 2017-08-08 NOTE — Progress Notes (Signed)
Social Work Patient ID: Scott Lane, male   DOB: [birth year], 32 y.o.   MRN: [id number]   Met with pt and wife today to review team conference discussion.  Pt eager to get home, however, he is aware that target d/c date set for 5/7 and for the reasoning behind this target.  After today's therapy sessions, he is agreed that he would benefit from this LOS to maximize gains. Have already set him up for OPPT to start on 5/8.  Continue to follow.  [name], LCSW

## 2017-08-08 NOTE — Progress Notes (Signed)
Social Work Social Work Assessment and Plan  Patient Details  Name: Scott Lane MRN: [id number] Date of Birth: [birth year]  Today's Date: 08/08/2017  Problem List:  Patient Active Problem List   Diagnosis Date Noted  . Postoperative pain   . Leukocytosis   . Anxiety state   . Elevated blood pressure reading   . Thoracic myelopathy 08/05/2017  . Neuropathic pain   . Muscle spasm   . Drug induced constipation   . Urinary retention   . Class 1 obesity due to excess calories with serious comorbidity and body mass index (BMI) of 33.0 to 33.9 in adult   . Myelopathy (HCC) 08/01/2017  . Thoracic spinal stenosis 07/31/2017  . Obesity (BMI 30.0-34.9) 05/17/2017   Past Medical History:  Past Medical History:  Diagnosis Date  . Chicken pox    Past Surgical History:  Past Surgical History:  Procedure Laterality Date  . LUMBAR LAMINECTOMY/DECOMPRESSION MICRODISCECTOMY N/A 08/01/2017   Procedure: Thoracic nine, ten, eleven, twelve laminectomy;  Surgeon: Tia Alert, MD;  [facility];  Service: Neurosurgery;  Laterality: N/A;  . none     Social History:  reports that he has never smoked. He has never used smokeless tobacco. He reports that he drinks about 1.2 oz of alcohol per week. He reports that he does not use drugs.  Family / Support Systems Marital Status: Married Patient Roles: Spouse Spouse/Significant Other: wife, Weldon Nouri @ [phone] Anticipated Caregiver: Spouse intermittent, Mod I goals  Ability/Limitations of Caregiver: Spouse works, but can work remotely from home some Caregiver Availability: Intermittent Family Dynamics: Pt and wife very encouraging to one another and pt very concerned that his needs do not cause wife added stress as she is pregnant with thier first child.  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 Legal Hisotry/Current Legal Issues: None Guardian/Conservator: None - per MD, pt is capable of making decisions on his own behalf.   Abuse/Neglect Abuse/Neglect Assessment Can Be Completed: Yes Physical Abuse: Denies Verbal Abuse: Denies Sexual Abuse: Denies Exploitation of patient/patient's resources: Denies Self-Neglect: Denies  Emotional Status Pt's affect, behavior adn adjustment status: Pt very pleasant and completes assessment without any difficulties.  As noted, he is concerned with any stress his health issues may be causing his wife.  He also has concerns about functional return  and being able to resume his prior level of activities that he enjoys.  He was able to meet with neuropsychology yesterday and he felt this was beneficial.   Recent Psychosocial Issues: None Pyschiatric History: None Substance Abuse History: None  Patient / Family Perceptions, Expectations & Goals Pt/Family understanding of illness & functional limitations: Pt and wife with good understanding of his stenosis, surgery performed and current functional limitations/ need for CIR. Premorbid pt/family roles/activities: Pt was completely independent, working f/t and very active with various sports. Anticipated changes in roles/activities/participation: minimal change if pt able to reach anticipated mod ind goals. Pt/family expectations/goals: "I just want to get back to where I was (functionally)."  Building surveyor: None Premorbid Home Care/DME Agencies: None Transportation available at discharge: yes Resource referrals recommended: Neuropsychology  Discharge Planning Living Arrangements: Spouse/significant other Support Systems: Spouse/significant other Type of Residence: Private residence Civil engineer, contracting: Media planner  (  specify)([facility]) Financial Resources: Employment Financial Screen Referred: No Living Expenses: Database administrator Management: Patient Does the patient have any problems obtaining your medications?: No Home Management: pt and wife Patient/Family Preliminary Plans: Pt to d/c home with wife able to provide intermittent support  Social Work Anticipated Follow Up Needs: HH/OP Expected length of stay: 5-7 days   Clinical Impression Unfortunate gentleman here following surgery for thoracic stenosis.  Eager to make good progress and be able to return to his active lifestyle.  Concerned about any stress this has placed on his wife who is pregnant with their first child.  Pt making good progress and anticipate short LOS.  Will follow for support and d/c planning needs.  Adisyn Ruscitti 08/08/2017, 5:28 PM

## 2017-08-08 NOTE — Patient Care Conference (Signed)
Inpatient RehabilitationTeam Conference and Plan of Care Update Date: 08/07/2017   Time: 2:45 PM    Patient Name: Scott Lane      Medical Record Number: [id number]  Date of Birth: [birth year] Sex: Male         Room/Bed: 4M02C/4M02C-01 Payor Info: Payor: [employer]PLOYEE / Plan: [place] [employer] / Product Type: *No Product type* /    Admitting Diagnosis: thoracic sten with myelopthy  Admit Date/Time:  08/05/2017  6:28 PM Admission Comments: No comment available   Primary Diagnosis:  <principal problem not specified> Principal Problem: <principal problem not specified>  Patient Active Problem List   Diagnosis Date Noted  . Postoperative pain   . Leukocytosis   . Anxiety state   . Elevated blood pressure reading   . Thoracic myelopathy 08/05/2017  . Neuropathic pain   . Muscle spasm   . Drug induced constipation   . Urinary retention   . Class 1 obesity due to excess calories with serious comorbidity and body mass index (BMI) of 33.0 to 33.9 in adult   . Myelopathy (HCC) 08/01/2017  . Thoracic spinal stenosis 07/31/2017  . Obesity (BMI 30.0-34.9) 05/17/2017    Expected Discharge Date: Expected Discharge Date: 08/13/17  Team Members Present: Physician leading conference: Dr. Maryla Morrow Social Worker Present: Amada Jupiter, LCSW Nurse Present: Ronny Bacon, RN PT Present: Grier Rocher, PT OT Present: Rosalio Loud, OT SLP Present: Jackalyn Lombard, SLP PPS Coordinator present : Tora Duck, RN, CRRN     Current Status/Progress Goal Weekly Team Focus  Medical   Sudden onset of leg paralysis secondary to thoracic stenosis with myelopathy.  Status post decompressive thoracic laminectomy T9-10 and T10-11, T11-12.   Improve mobilit,y transfers, self-care, urinary retention, leukocytosis, elevated BP  See above   Bowel/Bladder   continent of b/b, LBM 4/28, sorbitol 4/30 for pt c/o constipation, uses urinal or   Mod I assist with b/b,   monitor b/b q shift and prn, adm medication as  ordered   Swallow/Nutrition/ Hydration             ADL's   Supervision for UB bathing, min assist for LB bathing, mod to max for LB dressing without AE, min assist for mobility and for toilet transfers/shower transfers  modified indepnedent overall  selfcare retraining, transfer training, balance retraining, neuromuscular re-education, DME and AE education   Mobility   min assist overall with RW; mod assist without AD  modified independent overall except supervision for flight of stairs  NMR, gait, balance, d/c planning, stairs, community reintegration   Communication             Safety/Cognition/ Behavioral Observations            Pain   c/o back pain 7-8/10, on scheduled norco q 6 hours, prn robaxin  pain <3   monitor pain q shift and prn   Skin   surgical incision T 9-12 with honeycomb dressing, dressing is peeling off  maintain skin with Mod assist, no infection, no breakdown while on IPR  monitor skin q shift and prn    Rehab Goals Patient on target to meet rehab goals: Yes *See Care Plan and progress notes for long and short-term goals.     Barriers to Discharge  Current Status/Progress Possible Resolutions Date Resolved   Physician    Medical stability     See above  Therapies, PVRs, follow vitals, follow labs      Nursing  PT                    OT Home environment access/layout  Pt has not bedrooms on the first floor.             SLP                SW                Discharge Planning/Teaching Needs:  Home with wife who can provide intermittent assistance.    mod ind goals   Team Discussion:  New eval and anticipate good progress and short LOS need.  Having some urinary retention.  Needs cues for actually following back precautions which he can verbalize but doesn't always follow.  Mod/max to amb without walker.  Showing possibly some sensory deficits?  All goals set for Mod ind   Revisions to Treatment Plan:  None    Continued Need for  Acute Rehabilitation Level of Care: The patient requires daily medical management by a physician with specialized training in physical medicine and rehabilitation for the following conditions: Daily direction of a multidisciplinary physical rehabilitation program to ensure safe treatment while eliciting the highest outcome that is of practical value to the patient.: Yes Daily medical management of patient stability for increased activity during participation in an intensive rehabilitation regime.: Yes Daily analysis of laboratory values and/or radiology reports with any subsequent need for medication adjustment of medical intervention for : Post surgical problems;Urological problems;Other;Blood pressure problems  [name] 08/08/2017, 5:32 PM

## 2017-08-09 ENCOUNTER — Inpatient Hospital Stay (HOSPITAL_COMMUNITY): Payer: 59 | Admitting: Physical Therapy

## 2017-08-09 ENCOUNTER — Inpatient Hospital Stay (HOSPITAL_COMMUNITY): Payer: 59 | Admitting: Occupational Therapy

## 2017-08-09 LAB — CBC WITH DIFFERENTIAL/PLATELET
Basophils Absolute: 0 10*3/uL (ref 0.0–0.1)
Basophils Relative: 0 %
EOS ABS: 0 10*3/uL (ref 0.0–0.7)
Eosinophils Relative: 0 %
HCT: 42.1 % (ref 39.0–52.0)
HEMOGLOBIN: 14.4 g/dL (ref 13.0–17.0)
LYMPHS ABS: 0.9 10*3/uL (ref 0.7–4.0)
LYMPHS PCT: 8 %
MCH: 30 pg (ref 26.0–34.0)
MCHC: 34.2 g/dL (ref 30.0–36.0)
MCV: 87.7 fL (ref 78.0–100.0)
Monocytes Absolute: 1.5 10*3/uL — ABNORMAL HIGH (ref 0.1–1.0)
Monocytes Relative: 12 %
NEUTROS ABS: 10 10*3/uL — AB (ref 1.7–7.7)
NEUTROS PCT: 80 %
Platelets: 297 10*3/uL (ref 150–400)
RBC: 4.8 MIL/uL (ref 4.22–5.81)
RDW: 12.7 % (ref 11.5–15.5)
WBC: 12.5 10*3/uL — AB (ref 4.0–10.5)

## 2017-08-09 LAB — BASIC METABOLIC PANEL
Anion gap: 5 (ref 5–15)
BUN: 17 mg/dL (ref 6–20)
CHLORIDE: 102 mmol/L (ref 101–111)
CO2: 29 mmol/L (ref 22–32)
CREATININE: 0.91 mg/dL (ref 0.61–1.24)
Calcium: 9.1 mg/dL (ref 8.9–10.3)
GFR calc Af Amer: >60 mL/min (ref >60)
GFR calc non Af Amer: >60 mL/min (ref >60)
Glucose, Bld: 101 mg/dL — ABNORMAL HIGH (ref 65–99)
POTASSIUM: 4.5 mmol/L (ref 3.5–5.1)
SODIUM: 136 mmol/L (ref 135–145)

## 2017-08-09 MED ORDER — DEXAMETHASONE 2 MG PO TABS
4.0000 mg | ORAL_TABLET | Freq: Two times a day (BID) | ORAL | Status: AC
  Administered 2017-08-09 – 2017-08-10 (×3): 4 mg via ORAL
  Filled 2017-08-09 (×3): qty 2

## 2017-08-09 MED ORDER — HYDROCODONE-ACETAMINOPHEN 7.5-325 MG PO TABS
1.0000 | ORAL_TABLET | Freq: Three times a day (TID) | ORAL | Status: DC | PRN
  Administered 2017-08-12: 1 via ORAL
  Filled 2017-08-09: qty 1

## 2017-08-09 NOTE — Progress Notes (Signed)
Occupational Therapy Session Note  Patient Details  Name: RAYMONT ANDREONI MRN: [id number] Date of Birth: Dec 07, 1985  Today's Date: 08/09/2017 OT Individual Time: 8295-6213 OT Individual Time Calculation (min): 58 min    Short Term Goals: Week 1:  OT Short Term Goal 1 (Week 1): STGs equal to LTGs set at modified independent overall.  Skilled Therapeutic Interventions/Progress Updates:    Treatment session with focus on functional transfers and dynamic standing balance.  Pt received upright in w/c upon arrival reporting already "cleaned up" this AM and ready for therapy session.  Discussed bathroom setup with plan to attempt various tub/shower transfers.  Completed transfer into tub/shower in ADL tubroom with tub bench and then shower seat.  Pt supervision with transfers sitting on tub bench or shower seat and then pivoting in/bringing legs over edge.  Pt asking about stepping over tub ledge.  Completed with min assist and increased time with pt requiring assist to lift LLE over tub ledge.  Recommend pt complete transfers sitting with pt expressing desire to utilize shower chair instead of tub bench.  Pt's wife sent picture of home shower, Pt should be able to utilize shower seat in tub/shower and complete seated shower transfer as practiced above.  Pt ambulated 120' with RW supervision to therapy gym.  Engaged in mini squats, heel raises, and hip flexion/extension standing at parallel bars with focus on strengthening and endurance.  Pt with LOB and Lt knee buckling during hip flexion/extension when standing on LLE, requiring max assist to regain balance.  Pt asking questions regarding whether he may have injured Lt knee when he fell PTA.  Notified PA and RN of pt concerns and mild swelling in Lt knee, RN to apply ice to Lt knee.  Pt ambulated back to room with RW with min guard.  Therapy Documentation Precautions:  Precautions Precautions: Back, Fall Precaution Comments: pt able to verbalize 3/3 back  precautions; cues to adhere to functionally Restrictions Weight Bearing Restrictions: No General:   Vital Signs: Therapy Vitals Temp: 98.2 F (36.8 C) Temp Source: Oral Pulse Rate: 67 Resp: 18 BP: 114/71 Patient Position (if appropriate): Lying Oxygen Therapy SpO2: 99 % O2 Device: Room Air Pain:  Pt with no c/o pain  See Function Navigator for Current Functional Status.   Therapy/Group: Individual Therapy  Rosalio Loud 08/09/2017, 9:49 AM

## 2017-08-09 NOTE — Plan of Care (Signed)
  Problem: Consults Goal: RH GENERAL PATIENT EDUCATION Description See Patient Education module for education specifics. Outcome: Progressing Goal: Skin Care Protocol Initiated - if Braden Score 18 or less Description If consults are not indicated, leave blank or document N/A Outcome: Progressing Goal: Nutrition Consult-if indicated Outcome: Progressing   Problem: RH BOWEL ELIMINATION Goal: RH STG MANAGE BOWEL WITH ASSISTANCE Description STG Manage Bowel with mod I Assistance.  Outcome: Progressing Goal: RH STG MANAGE BOWEL W/MEDICATION W/ASSISTANCE Description STG Manage Bowel with Medication with mod I Assistance.  Outcome: Progressing   Problem: RH BLADDER ELIMINATION Goal: RH STG MANAGE BLADDER WITH ASSISTANCE Description STG Manage Bladder With Mod I Assistance  Outcome: Progressing Flowsheets (Taken 08/09/2017 1128) STG: Pt will manage bladder with assistance: 6-Modified independent   Problem: RH SKIN INTEGRITY Goal: RH STG SKIN FREE OF INFECTION/BREAKDOWN Description Manage skin care and incision with min A  Outcome: Progressing Goal: RH STG MAINTAIN SKIN INTEGRITY WITH ASSISTANCE Description STG Maintain Skin Integrity With min Assistance.  Outcome: Progressing Goal: RH OTHER STG SKIN INTEGRITY GOALS W/ASSIST Description Other STG Skin Integrity Goals With min Assistance.  Outcome: Progressing   Problem: RH SAFETY Goal: RH STG ADHERE TO SAFETY PRECAUTIONS W/ASSISTANCE/DEVICE Description STG Adhere to Safety Precautions With mod I Assistance/Device.  Outcome: Progressing Goal: RH STG DECREASED RISK OF FALL WITH ASSISTANCE Description STG Decreased Risk of Fall With Mod I  Assistance.  Outcome: Progressing   Problem: RH PAIN MANAGEMENT Goal: RH STG PAIN MANAGED AT OR BELOW PT'S PAIN GOAL Description With Min A  Outcome: Progressing Goal: RH OTHER STG PAIN MANAGEMENT GOALS W/ASSIST Description Other STG Pain Management Goals With Mod I  Assistance.   Outcome: Progressing   Problem: RH KNOWLEDGE DEFICIT GENERAL Goal: RH STG INCREASE KNOWLEDGE OF SELF CARE AFTER HOSPITALIZATION Outcome: Progressing   Problem: Consults Goal: Diabetes Guidelines if Diabetic/Glucose > 140 Description If diabetic or lab glucose is > 140 mg/dl - Initiate Diabetes/Hyperglycemia Guidelines & Document Interventions  Outcome: Not Applicable

## 2017-08-09 NOTE — Progress Notes (Signed)
Patient called requesting someone to pick up his call light. Patient was found up at the sink. He reported to the writer that he had just come from the bathroom and "couldn't wait", however he did not call for assistance to the bathroom.  The patient's  bed was no longer in its lowest  position and the alarm was off. The writer reminded patient that he is not to be up without assistance and he has been repeatedly educated on the subject but he is noncompliant and stated "I am not gonna fall." Alarms have been set on the patient's bed/chair and have been found turned off later. The writer again set the bed alarms and again instructed patient to call for assistance. Charge nurse notified.

## 2017-08-09 NOTE — Progress Notes (Signed)
[place] PHYSICAL MEDICINE & REHABILITATION     PROGRESS NOTE  Subjective/Complaints:  Patient seen sitting up at the edge of his bed this morning. He states he slept well overnight. He states he would like to maximize his functional improvement and discharge on Tuesday. He has questions about his PVRs.  ROS:  Denies CP, SOB, N/V/D.  Objective: Vital Signs: Blood pressure 114/71, pulse 67, temperature 98.2 F (36.8 C), temperature source Oral, resp. rate 18, height 6' (1.829 m), weight 109 kg (240 lb 4.8 oz), SpO2 99 %. No results found. Recent Labs    08/09/17 0642  WBC 12.5*  HGB 14.4  HCT 42.1  PLT 297   No results for input(s): NA, K, CL, GLUCOSE, BUN, CREATININE, CALCIUM in the last 72 hours.  Invalid input(s): CO CBG (last 3)  No results for input(s): GLUCAP in the last 72 hours.  Wt Readings from Last 3 Encounters:  08/05/17 109 kg (240 lb 4.8 oz)  08/01/17 111 kg (244 lb 11.4 oz)  05/17/17 114.9 kg (253 lb 3.2 oz)    Physical Exam:  BP 114/71 (BP Location: Right Arm)   Pulse 67   Temp 98.2 F (36.8 C) (Oral)   Resp 18   Ht 6' (1.829 m)   Wt 109 kg (240 lb 4.8 oz)   SpO2 99%   BMI 32.59 kg/m  Constitutional: He appears well-developed. Obese. HENT: Normocephalic and atraumatic.  Eyes: EOM are normal. No discharge.  Cardiovascular: RRR. No JVD. Respiratory: Effort normal and breath sounds normal.  GI: Bowel sounds are normal. He exhibits no distension.  Musc: No edema or tenderness in extremities Neurological: Patient is alert and oriented.   Follows full commands.  Motor: B/l UE 5/5 proximal to distal RLE: 4+/5 proximal to distal (stable) LLE: 4+/5 proximal to distal (stable) Skin.  Surgical incision with dressing C/D/I.  Assessment/Plan: 1. Functional deficits secondary to thoracic stenosis with myelopathy s/p decompression which require 3+ hours per day of interdisciplinary therapy in a comprehensive inpatient rehab setting. Physiatrist is  providing close team supervision and 24 hour management of active medical problems listed below. Physiatrist and rehab team continue to assess barriers to discharge/monitor patient progress toward functional and medical goals.  Function:  Bathing Bathing position   Position: Shower  Bathing parts Body parts bathed by patient: Right arm, Left arm, Chest, Abdomen, Front perineal area, Buttocks, Right upper leg, Left upper leg Body parts bathed by helper: Back, Left lower leg, Right lower leg  Bathing assist Assist Level: Supervision or verbal cues      Upper Body Dressing/Undressing Upper body dressing Upper body dressing/undressing activity did not occur: N/A What is the patient wearing?: Pull over shirt/dress     Pull over shirt/dress - Perfomed by patient: Thread/unthread left sleeve, Thread/unthread right sleeve, Put head through opening, Pull shirt over trunk          Upper body assist Assist Level: More than reasonable time      Lower Body Dressing/Undressing Lower body dressing   What is the patient wearing?: Pants, Socks, Shoes, Underwear Underwear - Performed by patient: Thread/unthread right underwear leg, Thread/unthread left underwear leg, Pull underwear up/down   Pants- Performed by patient: Pull pants up/down, Thread/unthread left pants leg, Thread/unthread right pants leg Pants- Performed by helper: Thread/unthread left pants leg   Non-skid slipper socks- Performed by helper: Don/doff right sock, Don/doff left sock Socks - Performed by patient: Don/doff right sock, Don/doff left sock   Shoes - Performed  by patient: Don/doff right shoe, Don/doff left shoe, Fasten right, Fasten left Shoes - Performed by helper: Don/doff right shoe, Don/doff left shoe, Fasten right, Fasten left          Lower body assist Assist for lower body dressing: Supervision or verbal cues      Toileting Toileting Toileting activity did not occur: Safety/medical concerns Toileting steps  completed by patient: Adjust clothing prior to toileting, Adjust clothing after toileting, Performs perineal hygiene   Toileting Assistive Devices: Grab bar or rail  Toileting assist Assist level: Supervision or verbal cues   Transfers Chair/bed transfer   Chair/bed transfer method: Stand pivot, Ambulatory Chair/bed transfer assist level: Touching or steadying assistance (Pt > 75%) Chair/bed transfer assistive device: Armrests, Walker     Locomotion Ambulation     Max distance: 15' Assist level: Moderate assist (Pt 50 - 74%)   Wheelchair          Cognition Comprehension Comprehension assist level: Follows complex conversation/direction with extra time/assistive device  Expression Expression assist level: Expresses complex ideas: With extra time/assistive device  Social Interaction Social Interaction assist level: Interacts appropriately with others - No medications needed.  Problem Solving Problem solving assist level: Solves complex problems: With extra time  Memory Memory assist level: Complete Independence: No helper    Medical Problem List and Plan: 1.  Sudden onset of leg paralysis secondary to thoracic stenosis with myelopathy.  Status post decompressive thoracic laminectomy T9-10 and T10-11, T11-12.  No back brace required  Continue CIR  Weaned Decadron to 4 mg twice a day on 5/3 2.  DVT Prophylaxis/Anticoagulation: SCDs.    Vascular study negative for DVT 3. Pain Management: Celebrex 200 mg every 12 hours, Robaxin as needed  Hydrocodone changed to when necessary on 5/2, decreased frequency on 5/3 4. Mood: Provide emotional support 5. Neuropsych: This patient is capable of making decisions on his own behalf. 6. Skin/Wound Care: Routine skin checks 7. Fluids/Electrolytes/Nutrition: Routine I/O's    BMP within acceptable limits on 4/30  Labs pending 8.  Constipation.  Laxative assistance  Bowel regimen increased on 5/1, and again on 5/2  9.  Urinary retention.    PVR showing mild retention, will attempt to DC Flomax 10.  Obesity.  BMI 33.19.  Dietary follow-up 11. Elevated BP  Resolved  Monitor with increased mobility 12. Leukocytosis  WBCs 12.5 on 5/3  Likely steroid-induced  Afebrile  Continue to monitor  LOS (Days) 4 A FACE TO FACE EVALUATION WAS PERFORMED  Meggin Ola Karis Juba 08/09/2017 8:09 AM

## 2017-08-10 ENCOUNTER — Inpatient Hospital Stay (HOSPITAL_COMMUNITY): Payer: 59 | Admitting: Occupational Therapy

## 2017-08-10 ENCOUNTER — Inpatient Hospital Stay (HOSPITAL_COMMUNITY): Payer: 59 | Admitting: Physical Therapy

## 2017-08-10 ENCOUNTER — Inpatient Hospital Stay (HOSPITAL_COMMUNITY): Payer: 59

## 2017-08-10 DIAGNOSIS — K30 Functional dyspepsia: Secondary | ICD-10-CM

## 2017-08-10 DIAGNOSIS — M25562 Pain in left knee: Secondary | ICD-10-CM

## 2017-08-10 MED ORDER — CALCIUM CARBONATE ANTACID 500 MG PO CHEW
1.0000 | CHEWABLE_TABLET | Freq: Two times a day (BID) | ORAL | Status: DC
  Administered 2017-08-10: 200 mg via ORAL
  Filled 2017-08-10 (×3): qty 1

## 2017-08-10 MED ORDER — LIDOCAINE 5 % EX PTCH
1.0000 | MEDICATED_PATCH | CUTANEOUS | Status: DC
  Administered 2017-08-10: 1 via TRANSDERMAL
  Filled 2017-08-10 (×2): qty 1

## 2017-08-10 NOTE — Progress Notes (Signed)
Occupational Therapy Session Note  Patient Details  Name: Scott Lane MRN: [id number] Date of Birth: 01/12/1986  Today's Date: 08/10/2017 OT Individual Time: 0102-7253 and 1115-1200 OT Individual Time Calculation (min): 57 min and 45 min    Short Term Goals: Week 1:  OT Short Term Goal 1 (Week 1): STGs equal to LTGs set at modified independent overall.  Skilled Therapeutic Interventions/Progress Updates:   Session 1: Upon entering the room, pt seated in wheelchair awaiting therapist with clothing picked out on bed for shower. Pt propelling wheelchair to sink for grooming tasks while seated at mod I level. OT covered surgical incision and pt ambulating into bathroom with overall supervision and use of RW. Pt seated on TTB for bathing at shower level with overall supervision. OT provided LH sponge in order to increase I with LB dressing. Pt returning to wheelchair for dressing with focus on figure four position for LB dressing in order to maintain back precautions. Pt remained seated in wheelchair with call bell and all needed items within reach. Pt refusing wheelchair alarm at this time. Education provided.   Session 2:  Upon entering the room, pt seated in recliner chair awaiting therapist. Pt agreeable to OT intervention. Pt ambulating 71' with RW to gym for dynamic standing balance with focus on maintaining back precautions. Pt standing on airex foam unsupported while tossing horse shoes. Pt having moments when B UEs were unsupported but after each throw pt would reach out and grab RW for support. On second attempt, RW not available for pt and he had 2 LOB during 8 throws requiring min A to correct. Pt engaging in squat to pick up horse shoes from floor with steady assistance and use of RW. Pt reports back feeling "tight" and OT provided reacher for pt to utilize to complete task. Pt ambulating to back to room at end of session with close supervision secondary to visible fatigue. Pt seated in  recliner chair with call bell and all needed items within reach.   Therapy Documentation Precautions:  Precautions Precautions: Back, Fall Precaution Comments: pt able to verbalize 3/3 back precautions; cues to adhere to functionally Restrictions Weight Bearing Restrictions: No    Pain: Pain Assessment Pain Scale: 0-10 Pain Score: 0-No pain  See Function Navigator for Current Functional Status.   Therapy/Group: Individual Therapy  Alen Bleacher 08/10/2017, 10:17 AM

## 2017-08-10 NOTE — Progress Notes (Signed)
[place] PHYSICAL MEDICINE & REHABILITATION     PROGRESS NOTE  Subjective/Complaints:  Pt seen sitting up in his chair this morning. He states he slept well overnight.He notes indigestion and left knee discomfort.  ROS:  +indigestion, left knee discomfort. Denies CP, SOB, N/V/D.  Objective: Vital Signs: Blood pressure 102/68, pulse 60, temperature 98.1 F (36.7 C), temperature source Oral, resp. rate 18, height 6' (1.829 m), weight 109 kg (240 lb 4.8 oz), SpO2 96 %. No results found. Recent Labs    08/09/17 0642  WBC 12.5*  HGB 14.4  HCT 42.1  PLT 297   Recent Labs    08/09/17 0642  NA 136  K 4.5  CL 102  GLUCOSE 101*  BUN 17  CREATININE 0.91  CALCIUM 9.1   CBG (last 3)  No results for input(s): GLUCAP in the last 72 hours.  Wt Readings from Last 3 Encounters:  08/05/17 109 kg (240 lb 4.8 oz)  08/01/17 111 kg (244 lb 11.4 oz)  05/17/17 114.9 kg (253 lb 3.2 oz)    Physical Exam:  BP 102/68 (BP Location: Right Arm)   Pulse 60   Temp 98.1 F (36.7 C) (Oral)   Resp 18   Ht 6' (1.829 m)   Wt 109 kg (240 lb 4.8 oz)   SpO2 96%   BMI 32.59 kg/m  Constitutional: He appears well-developed. Obese. HENT: Normocephalic and atraumatic.  Eyes: EOM are normal. No discharge.  Cardiovascular: RRR. No JVD. Respiratory: Effort normal and breath sounds normal.  GI: Bowel sounds are normal. He exhibits no distension.  Musc: No edema or tenderness in extremities, including left knee Neurological: Patient is alert and oriented.   Follows full commands.  Motor: B/l UE 5/5 proximal to distal RLE: 4+/5 proximal to distal (unchanged) LLE: 4+/5 proximal to distal (unchanged) Skin.  Surgical incision with dressing C/D/I.  Assessment/Plan: 1. Functional deficits secondary to thoracic stenosis with myelopathy s/p decompression which require 3+ hours per day of interdisciplinary therapy in a comprehensive inpatient rehab setting. Physiatrist is providing close team supervision  and 24 hour management of active medical problems listed below. Physiatrist and rehab team continue to assess barriers to discharge/monitor patient progress toward functional and medical goals.  Function:  Bathing Bathing position   Position: Shower  Bathing parts Body parts bathed by patient: Right arm, Left arm, Chest, Abdomen, Front perineal area, Buttocks, Right upper leg, Left upper leg, Right lower leg, Left lower leg, Back Body parts bathed by helper: Back, Left lower leg, Right lower leg  Bathing assist Assist Level: Supervision or verbal cues      Upper Body Dressing/Undressing Upper body dressing Upper body dressing/undressing activity did not occur: N/A What is the patient wearing?: Pull over shirt/dress     Pull over shirt/dress - Perfomed by patient: Thread/unthread left sleeve, Thread/unthread right sleeve, Put head through opening, Pull shirt over trunk          Upper body assist Assist Level: More than reasonable time      Lower Body Dressing/Undressing Lower body dressing   What is the patient wearing?: Pants, Socks, Shoes, Underwear Underwear - Performed by patient: Thread/unthread right underwear leg, Thread/unthread left underwear leg, Pull underwear up/down   Pants- Performed by patient: Pull pants up/down, Thread/unthread left pants leg, Thread/unthread right pants leg Pants- Performed by helper: Thread/unthread left pants leg   Non-skid slipper socks- Performed by helper: Don/doff right sock, Don/doff left sock Socks - Performed by patient: Don/doff right sock, Don/doff  left sock   Shoes - Performed by patient: Don/doff right shoe, Don/doff left shoe, Fasten right, Fasten left Shoes - Performed by helper: Don/doff right shoe, Don/doff left shoe, Fasten right, Fasten left          Lower body assist Assist for lower body dressing: Supervision or verbal cues      Toileting Toileting   Toileting steps completed by patient: Adjust clothing prior to  toileting, Adjust clothing after toileting, Performs perineal hygiene Toileting steps completed by helper: Adjust clothing after toileting(per Jobelle Mesias, NT) Toileting Assistive Devices: Grab bar or rail  Toileting assist Assist level: Supervision or verbal cues   Transfers Chair/bed transfer   Chair/bed transfer method: Stand pivot, Ambulatory Chair/bed transfer assist level: Touching or steadying assistance (Pt > 75%) Chair/bed transfer assistive device: Armrests, Patent attorney     Max distance: 120' Assist level: Supervision or verbal cues   Wheelchair          Cognition Comprehension Comprehension assist level: Follows complex conversation/direction with extra time/assistive device  Expression Expression assist level: Expresses complex ideas: With extra time/assistive device  Social Interaction Social Interaction assist level: Interacts appropriately with others - No medications needed.  Problem Solving Problem solving assist level: Solves complex problems: With extra time  Memory Memory assist level: Complete Independence: No helper    Medical Problem List and Plan: 1.  Sudden onset of leg paralysis secondary to thoracic stenosis with myelopathy.  Status post decompressive thoracic laminectomy T9-10 and T10-11, T11-12.  No back brace required  Continue CIR  Weaned Decadron to 4 mg twice a day on 5/3 2.  DVT Prophylaxis/Anticoagulation: SCDs.    Vascular study negative for DVT 3. Pain Management: Celebrex 200 mg every 12 hours, Robaxin as needed  Hydrocodone changed to when necessary on 5/2, decreased frequency on 5/3 4. Mood: Provide emotional support 5. Neuropsych: This patient is capable of making decisions on his own behalf. 6. Skin/Wound Care: Routine skin checks 7. Fluids/Electrolytes/Nutrition: Routine I/O's    BMP within acceptable limits on 5/3 8.  Constipation.  Laxative assistance  Bowel regimen increased on 5/1, and again on 5/2  9.   Urinary retention.   PVR showing mild retention, dCed Flomax   Improving 10.  Obesity.  BMI 33.19.  Dietary follow-up 11. Elevated BP  Resolved  Monitor with increased mobility 12. Leukocytosis  WBCs 12.5 on 5/3  Likely steroid-induced  Afebrile  Continue to monitor 13. Left knee discomfort  Lidoderm started on 5/4  X-ray ordered 14. Indigestion  Tums ordered  LOS (Days) 5 A FACE TO FACE EVALUATION WAS PERFORMED  Doniqua Saxby Karis Juba 08/10/2017 1:00 PM

## 2017-08-10 NOTE — Progress Notes (Signed)
[place] PT came to nurse and stated there was drainage from site on pt shirt and steri strip had came off. Assessed incision site, no drainage present on site but shirt had area where drainage was. Cleansed area and applied 4x4 gauze and tape over drsg to hold in place. Pt denied pain to area. Incision site red but no bleeding or drainage present. Will cont to monitor.

## 2017-08-10 NOTE — Progress Notes (Signed)
Physical Therapy Session Note  Patient Details  Name: Scott Lane MRN: [id number] Date of Birth: 20-Apr-1985  Today's Date: 08/10/2017 PT Individual Time: 9163-8466 AND 1400-1500 PT Individual Time Calculation (min): 35 min AND 60 min   Short Term Goals: Week 1:  PT Short Term Goal 1 (Week 1): = LTGS due to ELOS  Skilled Therapeutic Interventions/Progress Updates:   Pt received sitting in WC and agreeable to PT.   Gait with RW x 119f and supervision assist; min cues for improved step height on the L. Gait trainnig with SPC and min-supervision assist x 1539fwith cues for gait pattern, postureal control, AD management and use of stepping strategy to prevent LOB.   Standing balance on blude wedge: therapy ball lift overhead 2 x 15. Diagonal therapy ball lifts x 12 Bil.  Standing balance on Lateral reaches to place and remove clothes pins with narrow BOS x 5 BUE.  Min assist from PT throughout balance training due to poor corrective responses with LOB to the L.   [facility] and all needs met.     Session 2.   Gait training with RW 2 x 20027fith supervision assist. Pt noted have hove improved foot clearance on the LLE. But still demonstrates poor coordination with Limb advancement.   Reciprocal movement and endurance training on Nustep x 10 min level 6>8. Min cues for increased ROM   PT instructed pt in Modified [place]. LAQ, HS curls, hip abduction, mini squats, tandem stance, sit<>stand. All completed x 12 BLE with min-supervision assist in standing to prevent L lateral LOB and facilitate improve weight bearing through the LLE.  Patient returned to room and left sitting in [facility] in reach and all needs met.        Therapy Documentation Precautions:  Precautions Precautions: Back, Fall Precaution Comments: pt able to verbalize 3/3 back precautions; cues to adhere to functionally Restrictions Weight  Bearing Restrictions: No Pain: Pain Assessment Pain Scale: 0-10 Pain Score: 0-No pain   See Function Navigator for Current Functional Status.   Therapy/Group: Individual Therapy  AusLorie Phenix4/2019, 10:21 AM

## 2017-08-10 NOTE — Progress Notes (Signed)
Physical Therapy Session Note  Patient Details  Name: Scott Lane MRN: [id number] Date of Birth: [birth year]  Today's Date: 08/09/2017 PT Individual Time: 1005-1100 AND 1400-1445 AND 1630-1700    58mn , 45 min , 30 min   Short Term Goals: Week 1:  PT Short Term Goal 1 (Week 1): = LTGS due to ELOS  Skilled Therapeutic Interventions/Progress Updates:   Session 1.  Pt received sitting in WC and agreeable to PT. PT instructed pt in gait training to rehab gym with superivsion assist and min cues for improved step height. Stair management with supervision assist and BUE support. Min cues for step to gait pattern for safety with descent. Dynamic gait training to ambulate over 6 inch curb, over unlevel surface, and weave through 5 cones x 2 with close supervision assist and min cues for AD management on unlevel surface. BLE therex to improve HS activation HS curls, eecentric knee control in standing with level 2 tband. Clam shells x 15, and WC mobility with use of BLE for propulsion to force use of L HS. [facility] and all needs met.     Session 2.  Pt received sitting in WC and agreeable to PT. WC mobility to main entrance of hospital x [id number] supervision assist from PT with mi cues for doorway management. Gait training over unlevel surface with RW [id number] supervision assit and min cues for AD management. Gait with SBQC x [id number] min-supervision assist and min cues for improved reciprocal gait pattern. Pt noted to self select step to gait pattern.  [facility] returned to [facility] and [facility] reach and all needs met.    Session 3.  Pt received sitting in WC and agreeable to PT. Pt [facility]. Pt instructed in shooting basketball from level and unlevel surface 2 x 8 each. Min assist on unlevel surface progressing to supervision assist. BallDiona Foleyss off trampoline on airex pad wide BOS x 2 and narrow BOS x 4 with min-mod assist to prevent lateral LOB. Patient returned to room and [facility] reach and all needs met.         Therapy Documentation Precautions:  Precautions Precautions: Back, Fall Precaution Comments: pt able to verbalize 3/3 back precautions; cues to adhere to functionally Restrictions Weight Bearing Restrictions: No Pain: denies  See Function Navigator for Current Functional Status.   Therapy/Group: Individual Therapy  AustLorie Phenix/2019, 5:30 AM

## 2017-08-11 ENCOUNTER — Inpatient Hospital Stay (HOSPITAL_COMMUNITY): Payer: 59

## 2017-08-11 MED ORDER — DEXAMETHASONE 2 MG PO TABS
4.0000 mg | ORAL_TABLET | Freq: Every day | ORAL | Status: DC
  Administered 2017-08-11 – 2017-08-12 (×2): 4 mg via ORAL
  Filled 2017-08-11 (×2): qty 2

## 2017-08-11 NOTE — Progress Notes (Signed)
Physical Therapy Session Note  Patient Details  Name: ASHLAND WISEMAN MRN: [id number] Date of Birth: 12/07/1985  Today's Date: 08/11/2017 PT Individual Time: 4098-1191 PT Individual Time Calculation (min): 54 min   Short Term Goals: Week 1:  PT Short Term Goal 1 (Week 1): = LTGS due to ELOS  Skilled Therapeutic Interventions/Progress Updates:    Session focused on NMR to address proximal weakness while in tall kneeling position (worked on graded movement partial squat and then walking L/R knee forward/backwards x 10 reps with target for coordination, postural control and balance re-training in standing without UE support (utilized balance board for lateral weightshifting and anterior/posterior weightshifting (normal and narrrow BOS), and sit <> stands without UE support with focus on equal weightbearing. Gait training with RW and with SPC throughout session. Supervision with RW with improved L foot clearance noted but pt noted to circumduct LE to compensate for L foot clearance. SPC gait requires up to min assist for balance due to impaired postural control responses. Fam education with pt's wife in regards to stair negotiation training and overall recommendation for use of RW in the home especially when alone due to balance deficits. Wife practiced hands on stair negotiation and demonstrated safe technique for guarding.   Therapy Documentation Precautions:  Precautions Precautions: Back, Fall Precaution Comments: pt able to verbalize 3/3 back precautions; cues to adhere to functionally Restrictions Weight Bearing Restrictions: No  Pain:  No complaints. Reports L knee pain is better   See Function Navigator for Current Functional Status.   Therapy/Group: Individual Therapy  Karolee Stamps Darrol Poke, PT, DPT  08/11/2017, 2:28 PM

## 2017-08-11 NOTE — Progress Notes (Signed)
[place] PHYSICAL MEDICINE & REHABILITATION     PROGRESS NOTE  Subjective/Complaints:  Patient seen ambulated from the restroom and then sitting up in his chair this morning. He states he slept well overnight. He notes constipation, but does not want any change in his medications. He notes resolution of indigestion.  ROS:  Denies CP, SOB, N/V/D.  Objective: Vital Signs: Blood pressure 130/72, pulse 100, temperature 97.9 F (36.6 C), temperature source Oral, resp. rate 18, height 6' (1.829 m), weight 109 kg (240 lb 4.8 oz), SpO2 95 %. Dg Knee Complete 4 Views Left  Result Date: 08/10/2017 CLINICAL DATA:  Left knee pain. EXAM: LEFT KNEE - COMPLETE 4+ VIEW COMPARISON:  None. FINDINGS: No evidence of fracture, dislocation, or joint effusion. No evidence of arthropathy or other focal bone abnormality. Soft tissues are unremarkable. IMPRESSION: Negative. Electronically Signed   By: Ted Mcalpine M.D.   On: 08/10/2017 15:13   Recent Labs    08/09/17 0642  WBC 12.5*  HGB 14.4  HCT 42.1  PLT 297   Recent Labs    08/09/17 0642  NA 136  K 4.5  CL 102  GLUCOSE 101*  BUN 17  CREATININE 0.91  CALCIUM 9.1   CBG (last 3)  No results for input(s): GLUCAP in the last 72 hours.  Wt Readings from Last 3 Encounters:  08/05/17 109 kg (240 lb 4.8 oz)  08/01/17 111 kg (244 lb 11.4 oz)  05/17/17 114.9 kg (253 lb 3.2 oz)    Physical Exam:  BP 130/72 (BP Location: Right Arm)   Pulse 100   Temp 97.9 F (36.6 C) (Oral)   Resp 18   Ht 6' (1.829 m)   Wt 109 kg (240 lb 4.8 oz)   SpO2 95%   BMI 32.59 kg/m  Constitutional: He appears well-developed. Obese. HENT: Normocephalic and atraumatic.  Eyes: EOM are normal. No discharge.  Cardiovascular: RRR. No JVD. Respiratory: Effort normal and breath sounds normal.  GI: Bowel sounds are normal. He exhibits no distension.  Musc: No edema or tenderness in extremities, including left knee Neurological: Patient is alert and oriented.    Follows full commands.  Motor: B/l UE 5/5 proximal to distal RLE: 4+/5 proximal to distal (stable) LLE: 4+/5 proximal to distal (stable) Skin.  Surgical incision with dressing C/D/I.  Assessment/Plan: 1. Functional deficits secondary to thoracic stenosis with myelopathy s/p decompression which require 3+ hours per day of interdisciplinary therapy in a comprehensive inpatient rehab setting. Physiatrist is providing close team supervision and 24 hour management of active medical problems listed below. Physiatrist and rehab team continue to assess barriers to discharge/monitor patient progress toward functional and medical goals.  Function:  Bathing Bathing position   Position: Shower  Bathing parts Body parts bathed by patient: Right arm, Left arm, Chest, Abdomen, Front perineal area, Buttocks, Right upper leg, Left upper leg, Right lower leg, Left lower leg, Back Body parts bathed by helper: Back, Left lower leg, Right lower leg  Bathing assist Assist Level: Set up   Set up : To obtain items  Upper Body Dressing/Undressing Upper body dressing Upper body dressing/undressing activity did not occur: N/A What is the patient wearing?: Pull over shirt/dress     Pull over shirt/dress - Perfomed by patient: Thread/unthread left sleeve, Thread/unthread right sleeve, Put head through opening, Pull shirt over trunk          Upper body assist Assist Level: No help, No cues      Lower Body Dressing/Undressing  Lower body dressing   What is the patient wearing?: Pants, Socks, Shoes, Underwear Underwear - Performed by patient: Thread/unthread right underwear leg, Thread/unthread left underwear leg, Pull underwear up/down   Pants- Performed by patient: Thread/unthread right pants leg, Thread/unthread left pants leg, Pull pants up/down Pants- Performed by helper: Thread/unthread left pants leg   Non-skid slipper socks- Performed by helper: Don/doff right sock, Don/doff left sock Socks -  Performed by patient: Don/doff right sock, Don/doff left sock   Shoes - Performed by patient: Don/doff right shoe, Don/doff left shoe, Fasten right, Fasten left Shoes - Performed by helper: Don/doff right shoe, Don/doff left shoe, Fasten right, Fasten left          Lower body assist Assist for lower body dressing: Set up      Toileting Toileting   Toileting steps completed by patient: Adjust clothing prior to toileting, Performs perineal hygiene, Adjust clothing after toileting Toileting steps completed by helper: Adjust clothing after toileting(per Jobelle Mesias, NT) Toileting Assistive Devices: Grab bar or rail  Toileting assist Assist level: Supervision or verbal cues   Transfers Chair/bed transfer   Chair/bed transfer method: Ambulatory Chair/bed transfer assist level: Supervision or verbal cues Chair/bed transfer assistive device: Patent attorney     Max distance: 50' Assist level: Touching or steadying assistance (Pt > 75%)   Wheelchair   Type: Manual Max wheelchair distance: 161ft  Assist Level: Supervision or verbal cues  Cognition Comprehension Comprehension assist level: Follows complex conversation/direction with no assist  Expression Expression assist level: Expresses complex ideas: With extra time/assistive device  Social Interaction Social Interaction assist level: Interacts appropriately with others - No medications needed.  Problem Solving Problem solving assist level: Solves complex problems: With extra time  Memory Memory assist level: Complete Independence: No helper    Medical Problem List and Plan: 1.  Sudden onset of leg paralysis secondary to thoracic stenosis with myelopathy.  Status post decompressive thoracic laminectomy T9-10 and T10-11, T11-12.  No back brace required  Continue CIR  Weaned Decadron to 4 mg twice a day on 5/3, decreased to daily on 5/5 2.  DVT Prophylaxis/Anticoagulation: SCDs.    Vascular study negative for  DVT 3. Pain Management: Celebrex 200 mg every 12 hours, Robaxin as needed  Hydrocodone changed to when necessary on 5/2, decreased frequency on 5/3 4. Mood: Provide emotional support 5. Neuropsych: This patient is capable of making decisions on his own behalf. 6. Skin/Wound Care: Routine skin checks 7. Fluids/Electrolytes/Nutrition: Routine I/O's    BMP within acceptable limits on 5/3 8.  Constipation.  Laxative assistance  Bowel regimen increased on 5/1, and again on 5/2  9.  Urinary retention.   PVR showing mild retention, dCed Flomax   Improving 10.  Obesity.  BMI 33.19.  Dietary follow-up 11. Elevated BP  Resolved  Monitor with increased mobility 12. Leukocytosis  WBCs 12.5 on 5/3  Likely steroid-induced  Afebrile  Continue to monitor 13. Left knee discomfort  Lidoderm started on 5/4  X-ray reviewed, unremarkable 14. Indigestion  Tums ordered  Improved  LOS (Days) 6 A FACE TO FACE EVALUATION WAS PERFORMED  Myisha Pickerel Karis Juba 08/11/2017 5:33 PM

## 2017-08-11 NOTE — Progress Notes (Signed)
Spoke with pt about not having BM in 3 days. Gave pt the options of suppository or sorbitol. Pt stated he would try a suppository later on this evening when he goes to bed. Will report to oncoming nurse.

## 2017-08-12 ENCOUNTER — Ambulatory Visit: Payer: 59 | Admitting: Physical Therapy

## 2017-08-12 ENCOUNTER — Inpatient Hospital Stay (HOSPITAL_COMMUNITY): Payer: 59 | Admitting: Occupational Therapy

## 2017-08-12 ENCOUNTER — Inpatient Hospital Stay (HOSPITAL_COMMUNITY): Payer: 59 | Admitting: Physical Therapy

## 2017-08-12 ENCOUNTER — Inpatient Hospital Stay (HOSPITAL_COMMUNITY): Payer: 59

## 2017-08-12 MED ORDER — DEXAMETHASONE 4 MG PO TABS: 4.0000 mg | ORAL_TABLET | Freq: Every day | ORAL | 0 refills | Status: DC

## 2017-08-12 MED ORDER — HYDROCODONE-ACETAMINOPHEN 7.5-325 MG PO TABS: 1.0000 | ORAL_TABLET | Freq: Three times a day (TID) | ORAL | 0 refills | Status: DC | PRN

## 2017-08-12 MED ORDER — POLYETHYLENE GLYCOL 3350 17 G PO PACK: 17.0000 g | PACK | Freq: Every day | ORAL | 0 refills | Status: DC

## 2017-08-12 MED ORDER — METHOCARBAMOL 500 MG PO TABS: 500.0000 mg | ORAL_TABLET | Freq: Four times a day (QID) | ORAL | 0 refills | Status: DC | PRN

## 2017-08-12 MED ORDER — SENNA 8.6 MG PO TABS: 2.0000 | ORAL_TABLET | Freq: Two times a day (BID) | ORAL | 0 refills | Status: DC

## 2017-08-12 MED ORDER — LIDOCAINE 5 % EX PTCH: 1.0000 | MEDICATED_PATCH | CUTANEOUS | 0 refills | Status: DC

## 2017-08-12 MED ORDER — CELECOXIB 200 MG PO CAPS: 200.0000 mg | ORAL_CAPSULE | Freq: Two times a day (BID) | ORAL | 1 refills | Status: DC

## 2017-08-12 MED ORDER — CALCIUM CARBONATE ANTACID 500 MG PO CHEW: 1.0000 | CHEWABLE_TABLET | Freq: Two times a day (BID) | ORAL | 0 refills | Status: DC

## 2017-08-12 MED ORDER — NALOXEGOL OXALATE 25 MG PO TABS: 25.0000 mg | ORAL_TABLET | Freq: Every day | ORAL | 0 refills | Status: DC

## 2017-08-12 NOTE — Progress Notes (Signed)
[facility] PHYSICAL MEDICINE & REHABILITATION     PROGRESS NOTE  Subjective/Complaints:  Pt seen sitting up in his chair this Am.  He slept well overnight.  He states the Lidoderm patches helped his knee.  He would like to know if he can go home today.  ROS:  Denies CP, SOB, N/V/D.  Objective: Vital Signs: Blood pressure 126/72, pulse 71, temperature 98.4 F (36.9 C), temperature source Oral, resp. rate 18, height 6' (1.829 m), weight 109 kg (240 lb 4.8 oz), SpO2 96 %. Dg Knee Complete 4 Views Left  Result Date: 08/10/2017 CLINICAL DATA:  Left knee pain. EXAM: LEFT KNEE - COMPLETE 4+ VIEW COMPARISON:  None. FINDINGS: No evidence of fracture, dislocation, or joint effusion. No evidence of arthropathy or other focal bone abnormality. Soft tissues are unremarkable. IMPRESSION: Negative. Electronically Signed   By: Ted Mcalpine M.D.   On: 08/10/2017 15:13   No results for input(s): WBC, HGB, HCT, PLT in the last 72 hours. No results for input(s): NA, K, CL, GLUCOSE, BUN, CREATININE, CALCIUM in the last 72 hours.  Invalid input(s): CO CBG (last 3)  No results for input(s): GLUCAP in the last 72 hours.  Wt Readings from Last 3 Encounters:  08/05/17 109 kg (240 lb 4.8 oz)  08/01/17 111 kg (244 lb 11.4 oz)  05/17/17 114.9 kg (253 lb 3.2 oz)    Physical Exam:  BP 126/72 (BP Location: Right Arm)   Pulse 71   Temp 98.4 F (36.9 C) (Oral)   Resp 18   Ht 6' (1.829 m)   Wt 109 kg (240 lb 4.8 oz)   SpO2 96%   BMI 32.59 kg/m  Constitutional: He appears well-developed. Obese. HENT: Normocephalic and atraumatic.  Eyes: EOM are normal. No discharge.  Cardiovascular: RRR. No JVD. Respiratory: Effort normal and breath sounds normal.  GI: Bowel sounds are normal. He exhibits no distension.  Musc: No edema or tenderness in extremities, including left knee Neurological: Patient is alert and oriented.   Follows full commands.  Motor: B/l UE 5/5 proximal to distal RLE: 4+/5 proximal to  distal (improving) LLE: 4+/5 proximal to distal (improving) Skin.  Surgical incision with dressing C/D/I.  Assessment/Plan: 1. Functional deficits secondary to thoracic stenosis with myelopathy s/p decompression which require 3+ hours per day of interdisciplinary therapy in a comprehensive inpatient rehab setting. Physiatrist is providing close team supervision and 24 hour management of active medical problems listed below. Physiatrist and rehab team continue to assess barriers to discharge/monitor patient progress toward functional and medical goals.  Function:  Bathing Bathing position   Position: Shower  Bathing parts Body parts bathed by patient: Right arm, Left arm, Chest, Abdomen, Front perineal area, Buttocks, Right upper leg, Left upper leg, Right lower leg, Left lower leg, Back Body parts bathed by helper: Back, Left lower leg, Right lower leg  Bathing assist Assist Level: Set up   Set up : To obtain items  Upper Body Dressing/Undressing Upper body dressing Upper body dressing/undressing activity did not occur: N/A What is the patient wearing?: Pull over shirt/dress     Pull over shirt/dress - Perfomed by patient: Thread/unthread left sleeve, Thread/unthread right sleeve, Put head through opening, Pull shirt over trunk          Upper body assist Assist Level: No help, No cues      Lower Body Dressing/Undressing Lower body dressing   What is the patient wearing?: Pants, Socks, Shoes, Underwear Underwear - Performed by patient: Thread/unthread right  underwear leg, Thread/unthread left underwear leg, Pull underwear up/down   Pants- Performed by patient: Thread/unthread right pants leg, Thread/unthread left pants leg, Pull pants up/down Pants- Performed by helper: Thread/unthread left pants leg   Non-skid slipper socks- Performed by helper: Don/doff right sock, Don/doff left sock Socks - Performed by patient: Don/doff right sock, Don/doff left sock   Shoes - Performed  by patient: Don/doff right shoe, Don/doff left shoe, Fasten right, Fasten left Shoes - Performed by helper: Don/doff right shoe, Don/doff left shoe, Fasten right, Fasten left          Lower body assist Assist for lower body dressing: Set up      Toileting Toileting   Toileting steps completed by patient: Adjust clothing prior to toileting, Performs perineal hygiene, Adjust clothing after toileting Toileting steps completed by helper: Adjust clothing after toileting(per Jobelle Mesias, NT) Toileting Assistive Devices: Grab bar or rail  Toileting assist Assist level: Supervision or verbal cues   Transfers Chair/bed transfer   Chair/bed transfer method: Ambulatory Chair/bed transfer assist level: Supervision or verbal cues Chair/bed transfer assistive device: Patent attorney     Max distance: 50' Assist level: Touching or steadying assistance (Pt > 75%)   Wheelchair   Type: Manual Max wheelchair distance: 16ft  Assist Level: Supervision or verbal cues  Cognition Comprehension Comprehension assist level: Follows complex conversation/direction with no assist  Expression Expression assist level: Expresses complex ideas: With extra time/assistive device  Social Interaction Social Interaction assist level: Interacts appropriately with others - No medications needed.  Problem Solving Problem solving assist level: Solves complex problems: With extra time  Memory Memory assist level: Complete Independence: No helper    Medical Problem List and Plan: 1.  Sudden onset of leg paralysis secondary to thoracic stenosis with myelopathy.  Status post decompressive thoracic laminectomy T9-10 and T10-11, T11-12.  No back brace required  Continue CIR, grad day today.  Pt would like to go home afterward, will discuss with team  Will see patient for transitional care management in 1-2 weeks  Weaned Decadron to 4 mg twice a day on 5/3, decreased to daily on 5/5-5/7, then d/c 2.   DVT Prophylaxis/Anticoagulation: SCDs.    Vascular study negative for DVT 3. Pain Management: Celebrex 200 mg every 12 hours, Robaxin as needed  Hydrocodone changed to when necessary on 5/2, decreased frequency on 5/3 4. Mood: Provide emotional support 5. Neuropsych: This patient is capable of making decisions on his own behalf. 6. Skin/Wound Care: Routine skin checks 7. Fluids/Electrolytes/Nutrition: Routine I/O's    BMP within acceptable limits on 5/3 8.  Constipation.  Laxative assistance  Bowel regimen increased on 5/1, and again on 5/2  9.  Urinary retention.   PVR showing mild retention, dCed Flomax   Improved 10.  Obesity.  BMI 33.19.  Dietary follow-up 11. Elevated BP  Resolved  Monitor with increased mobility 12. Leukocytosis  WBCs 12.5 on 5/3  Likely steroid-induced  Afebrile  Continue to monitor 13. Left knee discomfort  Lidoderm started on 5/4, with benefit  X-ray reviewed, unremarkable 14. Indigestion  Tums ordered  Improved  LOS (Days) 7 A FACE TO FACE EVALUATION WAS PERFORMED  Scott Lane 08/12/2017 8:09 AM

## 2017-08-12 NOTE — Progress Notes (Signed)
Occupational Therapy Session Note  Patient Details  Name: Scott Lane MRN: [id number] Date of Birth: May 28, 1985  Today's Date: 08/12/2017 OT Individual Time: 4098-1191 OT Individual Time Calculation (min): 60 min    Short Term Goals: Week 1:  OT Short Term Goal 1 (Week 1): STGs equal to LTGs set at modified independent overall.  Skilled Therapeutic Interventions/Progress Updates:    Treatment session with focus on functional mobility and dynamic standing balance.  Pt received upright in w/c reporting already taking a shower prior to therapy session.  Discussed current level and recommendation for use of shower seat at home for energy conservation and safety with transfers in/out of shower due to LLE weakness.  Pt ambulated to therapy gym with RW with improved gait and ability to pick up LLE, however did note LLE circumduction with increased fatigue.  Engaged in dynamic standing activities on foam mat to challenge balance reactions with reaching activity.  Engaged in 3 sets of 15 chest presses and overhead reaching with 6# medicine ball for endurance and balance.  Per request from pt, utilized balance board in parallel bars for lateral and anterior/posterior weight shifting with focus on obtaining midline between each weight shift.  Pt with one instance of nausea, requiring seated rest break.  Pt attributes nausea to constipation.  Discussed energy conservation strategies for self-care tasks and overall functional activities.  Returned to room as above and left seated upright in with all needs in reach.  Pt reports no questions, ready for d/c.    Pt made Modified independent in room with RW.  Therapy Documentation Precautions:  Precautions Precautions: Back, Fall Precaution Comments: pt able to verbalize 3/3 back precautions; cues to adhere to functionally Restrictions Weight Bearing Restrictions: No Pain: Pain Assessment Pain Scale: 0-10 Pain Score: 0-No pain  See Function Navigator  for Current Functional Status.   Therapy/Group: Individual Therapy  Rosalio Loud 08/12/2017, 9:49 AM

## 2017-08-12 NOTE — Progress Notes (Signed)
Social Work  Discharge Note  The overall goal for the admission was met for:   Discharge location: Yes - home with wife who can provide intermittent support  Length of Stay: Yes - 7 days  Discharge activity level: Yes - modified independent  Home/community participation: Yes  Services provided included: MD, RD, PT, OT, RN, TR, Pharmacy, Neuropsych and SW  Financial Services: Private Insurance: [employer]  Follow-up services arranged: Outpatient: PT via [facility], DME: walker via [place] and Patient/Family has no preference for HH/DME agencies  Comments (or additional information):  Patient/Family verbalized understanding of follow-up arrangements: Yes  Individual responsible for coordination of the follow-up plan: pt  Confirmed correct DME delivered: Scott Lane 08/12/2017    Scott Lane

## 2017-08-12 NOTE — Progress Notes (Signed)
Occupational Therapy Discharge Summary  Patient Details  Name: SUHAAS AGENA MRN: [id number] Date of Birth: 10/14/1985  Patient has met 10 of 10 long term goals due to improved activity tolerance, improved balance and ability to compensate for deficits.  Patient to discharge at overall Modified Independent level.  Patient's care partner is independent to provide the necessary intermittent assistance at discharge.    Reasons goals not met: N/A  Recommendation:  Patient will not require follow up OT at this time.  Equipment: No equipment provided  Reasons for discharge: treatment goals met and discharge from hospital  Patient/family agrees with progress made and goals achieved: Yes  OT Discharge Precautions/Restrictions  Precautions Precautions: Back;Fall Precaution Comments: pt able to verbalize 3/3 back precautions; cues to adhere to functionally Restrictions Weight Bearing Restrictions: No Pain Pain Assessment Pain Scale: 0-10 Pain Score: 0-No pain ADL  See Function Navigator Vision Baseline Vision/History: No visual deficits Patient Visual Report: No change from baseline Vision Assessment?: No apparent visual deficits Perception  Perception: Within Functional Limits Praxis Praxis: Intact Cognition Overall Cognitive Status: Within Functional Limits for tasks assessed Arousal/Alertness: Awake/alert Orientation Level: Oriented X4 Attention: Selective Selective Attention: Appears intact Memory: Appears intact Awareness: Appears intact Problem Solving: Appears intact Safety/Judgment: Appears intact Sensation Sensation Light Touch: Appears Intact Stereognosis: Appears Intact Hot/Cold: Appears Intact Proprioception: Impaired Detail Proprioception Impaired Details: Impaired LLE Additional Comments: reports some tingline in BLE, Lt foot mostly Coordination Gross Motor Movements are Fluid and Coordinated: No(UEs intact; BLE impaired motor coordination) Fine Motor  Movements are Fluid and Coordinated: Yes Coordination and Movement Description: impaired LLE > RLE Finger Nose Finger Test: WNL Extremity/Trunk Assessment RUE Assessment RUE Assessment: Within Functional Limits(AROM and strength WFLs, not formally assessed due to precautions) LUE Assessment LUE Assessment: Within Functional Limits(AROM and strength WFLs, not formally assessed due to precautions)   See Function Navigator for Current Functional Status.  Simonne Come 08/12/2017, 8:15 AM

## 2017-08-12 NOTE — Discharge Summary (Signed)
Discharge summary job [id number]

## 2017-08-12 NOTE — Progress Notes (Signed)
Physical Therapy Discharge Summary  Patient Details  Name: Scott Lane MRN: [id number] Date of Birth: [birth year]  Today's Date: 08/12/2017      Patient has met 8 of 8 long term goals due to improved activity tolerance, improved balance, improved postural control, increased strength and improved coordination.  Patient to discharge at an ambulatory level Modified Independent.   Patient's care partner is independent to provide the necessary physical assistance at discharge.  Reasons goals not met: N/A  Recommendation:  Patient will benefit from ongoing skilled PT services in outpatient setting to continue to advance safe functional mobility, address ongoing impairments in balance, neuromotor control, safety, gait, strength, and minimize fall risk.  Equipment: RW  Reasons for discharge: treatment goals met  Patient/family agrees with progress made and goals achieved: Yes  PT Discharge Precautions/Restrictions Precautions Precautions: Back;Fall Precaution Comments: pt able to verbalize 3/3 back precautions; cues to adhere to functionally Restrictions Weight Bearing Restrictions: No Pain Pain Assessment Pain Scale: 0-10 Pain Score: 0-No pain Vision/Perception  Perception Perception: Within Functional Limits Praxis Praxis: Intact  Cognition Overall Cognitive Status: Within Functional Limits for tasks assessed Arousal/Alertness: Awake/alert Orientation Level: Oriented X4 Attention: Selective Selective Attention: Appears intact Memory: Appears intact Awareness: Appears intact Problem Solving: Appears intact Safety/Judgment: Appears intact Sensation Sensation Light Touch: Appears Intact Stereognosis: Appears Intact Hot/Cold: Appears Intact Proprioception: Impaired Detail Proprioception Impaired Details: Impaired LLE Additional Comments: reports some tingline in BLE, Lt foot mostly Coordination Gross Motor Movements are Fluid and Coordinated: No(UEs intact; BLE  impaired motor coordination) Fine Motor Movements are Fluid and Coordinated: Yes Coordination and Movement Description: impaired LLE > RLE Finger Nose Finger Test: WNL Motor  Motor Motor: Abnormal postural alignment and control Motor - Discharge Observations: LLE generalized weakness  Mobility Transfers Sit to Stand: 6: Modified independent (Device/Increase time) Stand to Sit: 6: Modified independent (Device/Increase time) Locomotion  Ambulation Ambulation: Yes Ambulation/Gait Assistance: 6: Modified independent (Device/Increase time) Ambulation Distance (Feet): 150 Feet Assistive device: Rolling walker Gait Gait: Yes Gait Pattern: Decreased dorsiflexion - left Stairs / Additional Locomotion Ramp: 6: Modified independent (Device) Curb: 6: Modified independent (Device/increase time)  Trunk/Postural Assessment  Cervical Assessment Cervical Assessment: Within Functional Limits Thoracic Assessment Thoracic Assessment: Exceptions to WFL(back precautions) Lumbar Assessment Lumbar Assessment: Exceptions to WFL(back precautions) Postural Control Postural Control: Deficits on evaluation  Balance Balance Balance Assessed: Yes Standardized Balance Assessment Standardized Balance Assessment: Berg Balance Test Berg Balance Test Sit to Stand: Able to stand  independently using hands Standing Unsupported: Able to stand 2 minutes with supervision Sitting with Back Unsupported but Feet Supported on Floor or Stool: Able to sit safely and securely 2 minutes Stand to Sit: Controls descent by using hands Transfers: Able to transfer safely, definite need of hands Standing Unsupported with Eyes Closed: Able to stand 3 seconds Standing Ubsupported with Feet Together: Able to place feet together independently and stand for 1 minute with supervision From Standing, Reach Forward with Outstretched Arm: Can reach confidently >25 cm (10") From Standing Position, Pick up Object from Floor: Able to  pick up shoe, needs supervision From Standing Position, Turn to Look Behind Over each Shoulder: Looks behind one side only/other side shows less weight shift Turn 360 Degrees: Able to turn 360 degrees safely but slowly Standing Unsupported, Alternately Place Feet on Step/Stool: Able to complete >2 steps/needs minimal assist Standing Unsupported, One Foot in Front: Needs help to step but can hold 15 seconds Standing on One Leg: Tries to lift leg/unable to hold 3  seconds but remains standing independently Total Score: 36 Static Sitting Balance Static Sitting - Balance Support: No upper extremity supported Static Sitting - Level of Assistance: 6: Modified independent (Device/Increase time) Dynamic Sitting Balance Dynamic Sitting - Balance Support: During functional activity Dynamic Sitting - Level of Assistance: 6: Modified independent (Device/Increase time) Static Standing Balance Static Standing - Balance Support: Bilateral upper extremity supported Static Standing - Level of Assistance: 6: Modified independent (Device/Increase time) Dynamic Standing Balance Dynamic Standing - Balance Support: Bilateral upper extremity supported Dynamic Standing - Level of Assistance: 6: Modified independent (Device/Increase time) Extremity Assessment  RUE Assessment RUE Assessment: Within Functional Limits(AROM and strength WFLs, not formally assessed due to precautions) LUE Assessment LUE Assessment: Within Functional Limits(AROM and strength WFLs, not formally assessed due to precautions) RLE Assessment RLE Assessment: Exceptions to [facility] RLE Strength RLE Overall Strength Comments: 4-/5 grossly; proximal weakness greater than distal LLE Assessment LLE Assessment: Exceptions to [facility] LLE Strength LLE Overall Strength Comments: grossly 4-/5 with greater proximal weakness   See Function Navigator for Current Functional Status.  Rosita DeChalus 08/12/2017, 10:03 AM

## 2017-08-12 NOTE — Progress Notes (Signed)
Physical Therapy Session Note  Patient Details  Name: Scott Lane MRN: [id number] Date of Birth: 1986-01-26  Today's Date: 08/12/2017 PT Individual Time: 0915-1030 PT Individual Time Calculation (min): 75 min   Short Term Goals: Week 1:  PT Short Term Goal 1 (Week 1): = LTGS due to ELOS  Skilled Therapeutic Interventions/Progress Updates: Pt presented in w/c agreeable to therapy and denies pain. Pt ambulated to ADL apt and rehab gym and participated in functional activities, all performed at Mod I with RW. Participated in [place] with improved score of 36/56 from 32/36 however still noted to be a fall risk and instructed in continued use of RW for safety. Pt noted to have most difficulty with narrow BOS and decreased ankle strategy noted for recovery. Emphasized safety of RW and progression of rehab, provided and reviewed [place] with emphasis on use of counter/stable surface for tandem/SLS activities. Participated in side stepping and marching at wall rail for hip strengthening with min cues for maintaining neutral hip. Pt ambulated throughout unit with RW with cues for decreased L circumduction with fair carryover. Pt ambulated throughout unit with distances up to 28f before noted decreased gait quality and noted fatigue. Pt ambulated back to room and returned to w/c with needs met.      Therapy Documentation Precautions:  Precautions Precautions: Back, Fall Precaution Comments: pt able to verbalize 3/3 back precautions; cues to adhere to functionally Restrictions Weight Bearing Restrictions: No General:   Vital Signs: Therapy Vitals Temp: 98.9 F (37.2 C) Temp Source: Oral Pulse Rate: 83 Resp: 18 BP: 114/85 Patient Position (if appropriate): Sitting Oxygen Therapy SpO2: 96 % O2 Device: Room Air Pain: Pain Assessment Pain Scale: 0-10 Pain Score: 0-No pain   See Function Navigator for Current Functional Status.   Therapy/Group: Individual  Therapy  Ruhan Borak  Deliana Avalos, PTA  08/12/2017, 3:09 PM

## 2017-08-12 NOTE — Progress Notes (Signed)
Patient discharged home.  Left floor via wheelchair, escorted by nursing staff and family.  Patient verbalized understanding of discharge instructions as given by Deatra Ina, PA.  All patient belongings sent with patient including DME and prescriptions.  Pain medication given prior to discharge as instructed by Deatra Ina, PA.  Patient appears to be in no immediate distress at this time.  Dani Gobble, RN

## 2017-08-12 NOTE — Progress Notes (Signed)
Occupational Therapy Session Note  Patient Details  Name: Scott Lane MRN: [id number] Date of Birth: July 02, 1985  Today's Date: 08/12/2017 OT Individual Time: 1400-1458 OT Individual Time Calculation (min): 58 min    Short Term Goals: Week 1:  OT Short Term Goal 1 (Week 1): STGs equal to LTGs set at modified independent overall.  Skilled Therapeutic Interventions/Progress Updates:    Pt in w/c agreeable to therapy. Extensive discussion/review with pt re d/c, including AE/AD use in the home and community, community/work reintegration, health and wellness following d/c, as well as spinal precaution adherence during ADL/IADLs. Pt completed functional mobility around room and completed toilet transfer with mod I. Pt ambulated 160ft to rehab gym with RW and completed standing, playing Wii to address dynamic balance. Pt required intermittent unilateral support on walker when reaching forward but experienced no LOB and was mod I level overall. Pt demo good safety awareness in re to needing seated rest breaks. Pt completed 15 min, level 5 resistance on NuStep in order to increase strength/endurance in B UE/B LE necessary for higher level IADL/work tasks. Pt provided with blue resistance band and given demo re several home exercises to complete upon d/c. Pt ambulated back to room with wife present.   Therapy Documentation Precautions:  Precautions Precautions: Back, Fall Precaution Comments: pt able to verbalize 3/3 back precautions; cues to adhere to functionally Restrictions Weight Bearing Restrictions: No   Vital Signs: Therapy Vitals Temp: 98.9 F (37.2 C) Temp Source: Oral Pulse Rate: 83 Resp: 18 BP: 114/85 Patient Position (if appropriate): Sitting Oxygen Therapy SpO2: 96 % O2 Device: Room Air Pain: Pain Assessment Pain Scale: 0-10 Pain Score: 0-No pain ADL: ADL ADL Comments: See functional navigator Vision Baseline Vision/History: No visual deficits Patient Visual  Report: No change from baseline Vision Assessment?: No apparent visual deficits Perception  Perception: Within Functional Limits Praxis Praxis: Intact  See Function Navigator for Current Functional Status.   Therapy/Group: Individual Therapy  Crissie Reese 08/12/2017, 3:09 PM

## 2017-08-12 NOTE — Plan of Care (Signed)
  Problem: Consults Goal: RH GENERAL PATIENT EDUCATION Description See Patient Education module for education specifics. Outcome: Completed/Met Goal: Skin Care Protocol Initiated - if Braden Score 18 or less Description If consults are not indicated, leave blank or document N/A Outcome: Completed/Met Goal: Nutrition Consult-if indicated Outcome: Completed/Met   Problem: RH BOWEL ELIMINATION Goal: RH STG MANAGE BOWEL WITH ASSISTANCE Description STG Manage Bowel with mod I Assistance.  Outcome: Completed/Met Goal: RH STG MANAGE BOWEL W/MEDICATION W/ASSISTANCE Description STG Manage Bowel with Medication with mod I Assistance.  Outcome: Completed/Met   Problem: RH BLADDER ELIMINATION Goal: RH STG MANAGE BLADDER WITH ASSISTANCE Description STG Manage Bladder With Mod I Assistance  Outcome: Completed/Met   Problem: RH SKIN INTEGRITY Goal: RH STG SKIN FREE OF INFECTION/BREAKDOWN Description Manage skin care and incision with min A  Outcome: Completed/Met Goal: RH STG MAINTAIN SKIN INTEGRITY WITH ASSISTANCE Description STG Maintain Skin Integrity With min Assistance.  Outcome: Completed/Met Goal: RH OTHER STG SKIN INTEGRITY GOALS W/ASSIST Description Other STG Skin Integrity Goals With min Assistance.  Outcome: Completed/Met   Problem: RH SAFETY Goal: RH STG ADHERE TO SAFETY PRECAUTIONS W/ASSISTANCE/DEVICE Description STG Adhere to Safety Precautions With mod I Assistance/Device.  Outcome: Completed/Met Goal: RH STG DECREASED RISK OF FALL WITH ASSISTANCE Description STG Decreased Risk of Fall With Mod I  Assistance.  Outcome: Completed/Met   Problem: RH PAIN MANAGEMENT Goal: RH STG PAIN MANAGED AT OR BELOW PT'S PAIN GOAL Description With Min A  Outcome: Completed/Met Goal: RH OTHER STG PAIN MANAGEMENT GOALS W/ASSIST Description Other STG Pain Management Goals With Mod I  Assistance.  Outcome: Completed/Met   Problem: RH KNOWLEDGE DEFICIT GENERAL Goal: RH STG  INCREASE KNOWLEDGE OF SELF CARE AFTER HOSPITALIZATION Outcome: Completed/Met

## 2017-08-12 NOTE — Discharge Instructions (Signed)
Inpatient Rehab Discharge Instructions  Scott Lane [facility] Discharge date and time: No discharge date for patient encounter.   Activities/Precautions/ Functional Status: Activity: activity as tolerated Diet: regular diet Wound Care: keep wound clean and dry Functional status:  ___ No restrictions     ___ Walk up steps independently ___ 24/7 supervision/assistance   ___ Walk up steps with assistance ___ Intermittent supervision/assistance  ___ Bathe/dress independently ___ Walk with walker     _x__ Bathe/dress with assistance ___ Walk Independently    ___ Shower independently ___ Walk with assistance    ___ Shower with assistance ___ No alcohol     ___ Return to work/school ________    COMMUNITY REFERRALS UPON DISCHARGE:    Outpatient: PT                   Agency:  [facility]       Phone: [phone]                Appointment Date/Time:  08/14/17 @ 10:15 am  (please arrive at 10:00am)  Medical Equipment/Items Ordered:  Rolling walker                                                      Agency/Supplier: [facility] @ [phone]      Special Instructions: No driving   My questions have been answered and I understand these instructions. I will adhere to these goals and the provided educational materials after my discharge from the hospital.  Patient/Caregiver Signature _______________________________ Date __________  Clinician Signature _______________________________________ Date __________  Please bring this form and your medication list with you to all your follow-up doctor's appointments.

## 2017-08-13 ENCOUNTER — Other Ambulatory Visit: Payer: Self-pay

## 2017-08-13 ENCOUNTER — Telehealth: Payer: Self-pay | Admitting: *Deleted

## 2017-08-13 NOTE — Patient Outreach (Signed)
[facility]) Care Management  08/13/2017  Scott Lane 01/01/1986 [id number]   Telephone call for transition of care call.  Member was hospitalized for cauda equina compression, thoracic laminectomy on 07/31/17.  He transferred to inpatient rehab on 08/05/17 and was discharged on 08/12/17.  No answer and HIPAA compliant message left. Plan to send unsuccessful letter  Plan to schedule 2nd call attempt in 3-4 business days  Dudley Major RN, [facility] Care Management Coordinator [facility] Care Management [phone]

## 2017-08-13 NOTE — Telephone Encounter (Signed)
Left voicemail requesting call back.  

## 2017-08-13 NOTE — Telephone Encounter (Signed)
Transition Care Management Follow-up Telephone Call   Date discharged? 08/12/17   How have you been since you were released from the hospital? "good"   Do you understand why you were in the hospital? yes   Do you understand the discharge instructions? yes   Where were you discharged to? Home   Items Reviewed:  Medications reviewed: yes  Allergies reviewed: yes  Dietary changes reviewed: yes  Referrals reviewed: yes   Functional Questionnaire:   Activities of Daily Living (ADLs):   He states they are independent in the following: ambulation, bathing and hygiene, feeding, continence, grooming, toileting and dressing States they require assistance with the following: needs assistance going up steps   Any transportation issues/concerns?: no   Any patient concerns? no   Confirmed importance and date/time of follow-up visits scheduled yes  Provider Appointment booked with Dr Durene Cal 08/27/17  Confirmed with patient if condition begins to worsen call PCP or go to the ER.  Patient was given the office number and encouraged to call back with question or concerns.  : yes

## 2017-08-13 NOTE — Telephone Encounter (Signed)
Is the 21st not too far out to count for TCM?

## 2017-08-13 NOTE — Discharge Summary (Addendum)
NAME: PRENTISS, POLIO MEDICAL RECORD ZO:[id number] ACCOUNT [id number] DATE OF BIRTH:[birth year] FACILITY: [facility] LOC[facility]: [facility] PHYSICIAN:ANKIT PATEL, MD  DISCHARGE SUMMARY Date of admission 08/05/2017 DATE OF DISCHARGE:  08/12/2017  DISCHARGE DIAGNOSES: 1.  Thoracic stenosis with myelopathy, status post decompressive thoracic laminectomy. 2.  SCDs for DVT prophylaxis. 3.  Pain management. 4.  Constipation. 5.  Urinary retention improved. 6.  Obesity. 7.  Left knee discomfort.  HISTORY OF PRESENT ILLNESS:  This is a 32 year old right-handed male with unremarkable past medical history, on no prescription medication who lives with his spouse, independent prior to admission.  He presented 07/31/2017 with persistent lower extremity  weakness and numbness after throwing a golf ball when he felt an electric shock down his legs that persisted over two and a half weeks.  MRI showed thoracic spinal stenosis.  Per report, severe spinal stenosis at T9-12 with cord edema.  He underwent  decompressive thoracic laminectomy T9-T10, T10-11, T11-12 08/01/2017 per Dr. Marikay Alar.  HOSPITAL COURSE:  Pain management.  No brace required.  Decadron protocol as advised.  Bouts of constipation with laxative assistance as needed.  The patient had some difficulty with urination and placed on Flomax.  Physical and occupational therapy  ongoing.  The patient was admitted for comprehensive rehabilitation program.  PAST MEDICAL HISTORY:  See discharge diagnoses.   SOCIAL HISTORY:  Lives with spouse, independent prior to admission.  FUNCTIONAL STATUS:  Upon admission to rehab services: Minimal guard 120 feet with rolling walker, minimal assist for general transfers, min/mod assist with activities of daily living.  PHYSICAL EXAMINATION: VITAL SIGNS:  Blood pressure 145/72, pulse 62, temperature 98, respirations 16. GENERAL:  Alert male in no acute distress.   HEENT:  Extraocular movements intact. NECK:  Supple,  nontender.  No JVD. HEART:  Rate controlled. ABDOMEN:  Soft, nontender.  Good bowel sounds. LUNGS:  Clear to auscultation without wheeze.  REHABILITATION AND HOSPITAL COURSE:   The patient was admitted to inpatient rehabilitation services.  Therapies initiated on a 3-hour daily basis, consisting of physical therapy, occupational therapy and rehabilitation nursing.  The following issues were  addressed during the patient's rehabilitation stay:  Pertaining to the patient's thoracic myelopathy, stenosis, underwent decompressive thoracic laminectomy.  He would follow up with neurosurgery.  Taper of Decadron as advised.  SCDs for DVT  prophylaxis.  Venous Doppler studies negative.  Pain management with the use of Celebrex, Robaxin and hydrocodone as needed.  Blood pressure is controlled and monitored.  Bouts of urinary retention, improved with laxative assistance.  Urinary retention,  improved.  Flomax discontinued, voiding without difficulty.  He did have some left knee discomfort.  X-rays unremarkable.  The patient received weekly collaborative interdisciplinary team conferences to discuss estimated length of stay, family teaching  and any barriers to discharge.  He was ambulating with rolling walker and also straight point cane throughout sessions.  Supervision with rolling walker improved.  Improved left foot clearance.  Straight point cane required minimal assistance.  He could  gather his belongings for activities of daily living and homemaking.  It was advised no driving.  Full family teaching completed and planned discharge to home.  DISCHARGE MEDICATIONS:  Included Tums 200 mg p.o. b.i.d., Celebrex 200 mg p.o. q.12h.  Decadron taper as directed, Lidoderm patch daily, Movantik 25 mg p.o. daily, MiraLax daily, hold for loose stools, Senokot one tablet p.o. b.i.d., hydrocodone one  tablet p.o. t.i.d. as needed, Robaxin 500 mg p.o. every 6 hours as needed for muscle spasms.  His diet was regular.  He  would follow up with Dr. Maryla Morrow at the outpatient rehab service office as advised.   Dr. Marikay Alar, neurosurgery, call for  appointment.  Annice Pih medical management.  SPECIAL INSTRUCTIONS:  No driving.  SN/NUANCE D:08/12/2017 T:08/12/2017 JOB:[id number]

## 2017-08-14 ENCOUNTER — Encounter: Payer: Self-pay | Admitting: Physical Therapy

## 2017-08-14 ENCOUNTER — Other Ambulatory Visit: Payer: Self-pay

## 2017-08-14 ENCOUNTER — Ambulatory Visit: Payer: 59 | Attending: Physical Medicine & Rehabilitation | Admitting: Physical Therapy

## 2017-08-14 ENCOUNTER — Telehealth: Payer: Self-pay | Admitting: Registered Nurse

## 2017-08-14 DIAGNOSIS — R2681 Unsteadiness on feet: Secondary | ICD-10-CM | POA: Insufficient documentation

## 2017-08-14 DIAGNOSIS — R29818 Other symptoms and signs involving the nervous system: Secondary | ICD-10-CM | POA: Diagnosis not present

## 2017-08-14 DIAGNOSIS — M6281 Muscle weakness (generalized): Secondary | ICD-10-CM | POA: Insufficient documentation

## 2017-08-14 DIAGNOSIS — R208 Other disturbances of skin sensation: Secondary | ICD-10-CM | POA: Insufficient documentation

## 2017-08-14 DIAGNOSIS — R2689 Other abnormalities of gait and mobility: Secondary | ICD-10-CM | POA: Diagnosis not present

## 2017-08-14 DIAGNOSIS — G8222 Paraplegia, incomplete: Secondary | ICD-10-CM | POA: Insufficient documentation

## 2017-08-14 NOTE — Telephone Encounter (Signed)
Transitional care call placed, no answer. Left message to return the call.  

## 2017-08-14 NOTE — Telephone Encounter (Signed)
Left voicemail requesting call back to reschedule Hospital follow up appt to a sooner date.

## 2017-08-14 NOTE — Therapy (Signed)
[facility] [address] [place], [place], [address] Phone: [phone]   Fax:  [phone]  Physical Therapy Evaluation  Patient Details  Name: Scott Lane MRN: [id number] Date of Birth: February 02, 1986 Referring Provider: Maryla Morrow, MD   Encounter Date: 08/14/2017  PT End of Session - 08/14/17 1202    Visit Number  1    Number of Visits  17    Date for PT Re-Evaluation  10/13/17    Authorization Type   Family Deductible.  VL: MN    PT Start Time  1022    PT Stop Time  1115    PT Time Calculation (min)  53 min    Equipment Utilized During Treatment  Gait belt    Activity Tolerance  Patient tolerated treatment well    Behavior During Therapy  WFL for tasks assessed/performed       Past Medical History:  Diagnosis Date  . Chicken pox     Past Surgical History:  Procedure Laterality Date  . LUMBAR LAMINECTOMY/DECOMPRESSION MICRODISCECTOMY N/A 08/01/2017   Procedure: Thoracic nine, ten, eleven, twelve laminectomy;  Surgeon: Tia Alert, MD;  Location: [facility] OR;  Service: Neurosurgery;  Laterality: N/A;  . none      There were no vitals filed for this visit.   Subjective Assessment - 08/14/17 1027    Subjective  Pt reports 3 week history of significant LE fatigue, LE weakness with limp and foot drag when negotiating stairs.  Also began to experience numbness in LE.  Was supposed to participate in golf tournament and during tournament pt felt an electric shock down legs and pt collapsed and experienced significant numbness and weakness in LE, L > R.  Went to ED and found to have severe impingement in lower thoracic spine to nerves and spinal cord.  Underwent thoracic laminectomy; also saw flattening of the cervical spine and is planning on having a cervical laminectomy in the future.      Patient is accompained by:  Family member    Pertinent History  None - very active    Limitations  Standing;Walking     How long can you stand comfortably?  8-10 minutes    How long can you walk comfortably?  uses cane and RW household distance, RW for community distances    Diagnostic tests  MRI     Patient Stated Goals  To be more independent at home and improve LE strength    Currently in Pain?  Yes    Pain Score  4     Pain Location  Back    Pain Orientation  Posterior;Distal    Pain Descriptors / Indicators  Spasm         [facility] PT Assessment - 08/14/17 1037      Assessment   Medical Diagnosis  Thoracic stenosis with myelopathy, s/p decompression laminectomy    Referring Provider  Maryla Morrow, MD    Onset Date/Surgical Date  08/01/17    Prior Therapy  CIR after surgery      Precautions   Precautions  Back;Fall    Precaution Comments  no bending, lifting, twisting, arching    Required Braces or Orthoses  -- none      Restrictions   Weight Bearing Restrictions  No      Balance Screen   Has the patient fallen in the past 6 months  Yes    How many times?  1 when injury occured  Has the patient had a decrease in activity level because of a fear of falling?   Yes    Is the patient reluctant to leave their home because of a fear of falling?   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      Leisure  works out regularly; former [employer]  Would like to return to driving      Cognition    Overall Cognitive Status  Within Functional Limits for tasks assessed      Observation/Other Assessments   Focus on Therapeutic Outcomes (FOTO)   not indicated      Sensation   Light Touch  Impaired Detail    Light Touch Impaired Details  Impaired LLE    Hot/Cold  Appears Intact    Proprioception  Impaired by gross assessment    Additional Comments  decreased light touch L lower leg, impaired proprioception LLE      Coordination   Gross Motor Movements are Fluid and Coordinated  No      ROM / Strength   AROM / PROM / Strength  Strength      Strength   Overall Strength  Deficits    Overall Strength Comments  RLE: 4/5 knee extension and ankle DF, 2/5 hip flexion, 3+/5 knee flexion.  Same for LLE except 2/5 ankle DF      Ambulation/Gait   Ambulation/Gait  Yes    Ambulation/Gait Assistance  5: Supervision;4: Min assist    Ambulation/Gait Assistance Details  Supervision with RW, min A with SPC - step to sequence with cane, step through with RW    Ambulation Distance (Feet)  115 Feet    Assistive device  Rolling walker;Straight cane    Gait Pattern  Step-to pattern;Step-through pattern;Decreased step length - right;Decreased stance time - left;Decreased stride length;Decreased hip/knee flexion - right;Decreased hip/knee flexion - left;Decreased dorsiflexion - right;Decreased dorsiflexion - left;Left circumduction;Left genu recurvatum;Scissoring;Left foot flat;Right foot flat bilat foot slap    Ambulation Surface  Level;Indoor    Stairs  Yes    Stairs Assistance  5: Supervision;4: Min assist    Stairs Assistance Details (indicate cue type and reason)  supervision with bilat rails when performing step to sequencing, min A for alternating sequence due to LE weakness    Stair Management Technique  Two rails;Alternating pattern;Step to pattern;Forwards    Number of Stairs  4    Height of Stairs  6      Standardized Balance Assessment   Standardized Balance Assessment  Five Times Sit to  Stand;10 meter walk test;Berg Balance Test    Five times sit to stand comments   13.69 seconds with R foot forwards, L foot back, UE to push up, uncontrolled  descent    10 Meter Walk  20.47 seconds or 1.6 ft/sec                Objective measurements completed on examination: See above findings.              PT Education - 08/14/17 1202    Education provided  Yes    Education Details  Clinical findings, PT POC, goals    Person(s) Educated  Patient;Spouse    Methods  Explanation    Comprehension  Verbalized understanding       PT Short Term Goals - 08/14/17 1220      PT SHORT TERM GOAL #1   Title  Pt will participate in further assessment of standing balance with BERG    Time  4    Period  Weeks    Status  New    Target Date  09/13/17      PT SHORT TERM GOAL #2   Title  Pt will participate in initial fitting and training of bilat Bioness    Time  4    Period  Weeks    Status  New    Target Date  09/13/17      PT SHORT TERM GOAL #3   Title  Pt will improve five time sit <> stand from arm chair to </= 15 seconds with more equal WB through bilat LE and improved control during stand > sit    Baseline  13 seconds but with L foot forwards, pushing up with UE and uncontrolled descent    Time  4    Period  Weeks    Status  New    Target Date  09/13/17      PT SHORT TERM GOAL #4   Title  Pt will improve gait velocity with RW to >/= 1.8 ft/sec    Baseline  1.6 ft/sec with RW    Time  4    Period  Weeks    Status  New    Target Date  09/13/17      PT SHORT TERM GOAL #5   Title  Pt will negotiate 12 stairs with 2 rails, alternating sequence with supervision    Time  4    Period  Weeks    Status  New    Target Date  09/13/17        PT Long Term Goals - 08/14/17 1232      PT LONG TERM GOAL #1   Title  Pt will be independent with balance and LE strengthening HEP    Time  8    Period  Weeks    Status  New    Target Date  10/13/17      PT LONG TERM  GOAL #2   Title  Pt will improve standing balance and BERG by 8 points to decrease falls risk    Baseline  TBD    Time  8    Period  Weeks    Status  New    Target Date  10/13/17      PT LONG TERM GOAL #3   Title  Pt will improve gait velocity to >/= 2.0 ft/sec with appropriate AFO and LRAD    Time  8    Period  Weeks    Status  New    Target Date  10/13/17      PT LONG TERM GOAL #4   Title  Pt will participate in AFO assessment if foot  slap continues after 8 weeks of training with Bioness    Time  8    Period  Weeks    Status  New    Target Date  10/13/17      PT LONG TERM GOAL #5   Title  Pt will improve LE strength to perform five time sit <> stand without UE support in </= 15 seconds    Time  8    Period  Weeks    Status  New    Target Date  10/13/17      Additional Long Term Goals   Additional Long Term Goals  Yes      PT LONG TERM GOAL #6   Title  Pt will ambulate x 300' over indoor and outdoor paved surfaces with cane/appropriate AFO and supervision; will negotiate 12 stairs, 2 rails alternating sequence MOD I    Time  8    Period  Weeks    Status  New    Target Date  10/13/17             Plan - 08/14/17 1203    Clinical Impression Statement  Pt is a 32 year old male referred to Neuro OPPT for evaluation of LE weakness due to thoracic myelopathy s/p decompressive thoracic laminectormy T9 - T12.  Pt's PMH is significant for the following: no significant PMH but pt played football in college and also has cervical spine flattening with surgeon recommending cervical laminectomy in the future. The following deficits were noted during pt's exam: low back pain and muscle spasms, impaired coordination, impaired LE sensation, impaired strength, impaired standing balance and gait.  Pt's gait speed and five time sit to stand score indicate pt is at risk for falls. Pt would benefit from skilled PT to address these impairments and functional limitations to maximize  functional mobility independence and reduce falls risk.    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    Clinical Decision Making  Moderate    Rehab Potential  Good    Clinical Impairments Affecting Rehab Potential  will have to have cervical laminectomy soon    PT Frequency  2x / week    PT Duration  8 weeks    PT Treatment/Interventions  ADLs/Self Care Home Management;Cryotherapy;Electrical Stimulation;Moist Heat;DME Instruction;Gait Network engineer;Therapeutic activities;Functional mobility training;Therapeutic exercise;Balance training;Neuromuscular re-education;Patient/family education;Orthotic Fit/Training;Passive range of motion;Taping    PT Next Visit Plan  assess BERG; assess Bioness bilaterally ankle DF and hamstring.  Assess LE tone and ROM    PT Home Exercise Plan  stretching, core and bilat LE strengthening especially hip flexion, HS, DF    Consulted and Agree with Plan of Care  Patient;Family member/caregiver    Family Member Consulted  wife - "T"       Patient will benefit  from skilled therapeutic intervention in order to improve the following deficits and impairments:  Abnormal gait, Decreased balance, Decreased coordination, Decreased strength, Difficulty walking, Increased muscle spasms, Impaired  sensation, Pain  Visit Diagnosis: Paraplegia, incomplete (HCC)  Muscle weakness (generalized)  Other abnormalities of gait and mobility  Other disturbances of skin sensation  Other symptoms and signs involving the nervous system  Unsteadiness     Problem List Patient Active Problem List   Diagnosis Date Noted  . Indigestion   . Acute pain of left knee   . Postoperative pain   . Leukocytosis   . Anxiety state   . Elevated blood pressure reading   . Thoracic myelopathy 08/05/2017  . Neuropathic pain   . Muscle spasm   . Drug induced constipation   . Urinary retention   . Class 1 obesity due to excess calories with serious comorbidity and body mass index (BMI) of 33.0 to 33.9 in adult   . Myelopathy (HCC) 08/01/2017  . Thoracic spinal stenosis 07/31/2017  . Obesity (BMI 30.0-34.9) 05/17/2017    Dierdre Highman, PT, DPT 08/14/17    12:39 PM    [facility] [address] [place], [place], [address] Phone: [phone]   Fax:  [phone]  Name: Scott Lane MRN: [id number] Date of Birth: 1985/07/29

## 2017-08-14 NOTE — Telephone Encounter (Signed)
Transitional Care call Transitional Care Call Completed, Appointment Confirmed, Address Confirmed, New Patient Packet Mailed  Patient name: Scott Lane DOB: Oct 12, 1985 1. Are you/is patient experiencing any problems since coming home? No a. Are there any questions regarding any aspect of care? No 2. Are there any questions regarding medications administration/dosing? No a. Are meds being taken as prescribed? Yes b. "Patient should review meds with caller to confirm"  Medication List Reviewed 3. Have there been any falls? No 4. Has Home Health been to the house and/or have they contacted you? [facility], first visit 08/14/2017. a. If not, have you tried to contact them? NA b. Can we help you contact them? NA 5. Are bowels and bladder emptying properly? Yes a. Are there any unexpected incontinence issues? No b. If applicable, is patient following bowel/bladder programs? NA 6. Any fevers, problems with breathing, unexpected pain? No 7. Are there any skin problems or new areas of breakdown? No 8. Has the patient/family member arranged specialty MD follow up (ie cardiology/neurology/renal/surgical/etc.)?  Yes a. Can we help arrange? NA 9. Does the patient need any other services or support that we can help arrange? No 10. Are caregivers following through as expected in assisting the patient? Yes 11. Has the patient quit smoking, drinking alcohol, or using drugs as recommended? [name] states he doesn't smoke, drink alcohol or using illicit drugs.  Appointment date/time 08/16/2017, arrival time at 1;40 for 2:00 appointment. With Dr. Allena Katz at [address] [phone]

## 2017-08-15 ENCOUNTER — Encounter: Payer: Self-pay | Admitting: Physical Therapy

## 2017-08-15 ENCOUNTER — Ambulatory Visit: Payer: 59 | Admitting: Physical Therapy

## 2017-08-15 DIAGNOSIS — R2681 Unsteadiness on feet: Secondary | ICD-10-CM

## 2017-08-15 DIAGNOSIS — R208 Other disturbances of skin sensation: Secondary | ICD-10-CM

## 2017-08-15 DIAGNOSIS — R29818 Other symptoms and signs involving the nervous system: Secondary | ICD-10-CM

## 2017-08-15 DIAGNOSIS — G8222 Paraplegia, incomplete: Secondary | ICD-10-CM | POA: Diagnosis not present

## 2017-08-15 DIAGNOSIS — R2689 Other abnormalities of gait and mobility: Secondary | ICD-10-CM

## 2017-08-15 DIAGNOSIS — M6281 Muscle weakness (generalized): Secondary | ICD-10-CM

## 2017-08-15 NOTE — Patient Instructions (Signed)
Supine Hamstring Stretch    Keep other leg bent, strap belt around foot and lift leg until tension is felt.  Keep knee slightly bent.  Hold 30 seconds.  Repeat with other leg. Repeat __2__ times each leg. Do __2__ sessions per day.  Piriformis Stretch, Supine    Lie supine, one ankle crossed onto opposite knee. Keep pelvis stabilized.  Hold __30_ seconds. For deeper stretch gently push top knee away from body.  Repeat __2_ times per leg. Do _2__ sessions per day.  Butterfly, Supine    Lie on back, feet together. Lower knees toward floor. Hold _30__ seconds. Repeat _2__ times per session. Do _2__ sessions per day.  Place pillows under knees for support if needed.  Copyright  VHI. All rights reserved.

## 2017-08-15 NOTE — Therapy (Signed)
[facility] [address] [place], [place], [address] Phone: [phone]   Fax:  [phone]  Physical Therapy Treatment  Patient Details  Name: Scott Lane MRN: [id number] Date of Birth: 1985-11-26 Referring Provider: Maryla Morrow, MD   Encounter Date: 08/15/2017  PT End of Session - 08/15/17 1717    Visit Number  2    Number of Visits  17    Date for PT Re-Evaluation  10/13/17    Authorization Type  [place] Family Deductible.  VL: MN    PT Start Time  0803    PT Stop Time  0851    PT Time Calculation (min)  48 min    Equipment Utilized During Treatment  Gait belt    Activity Tolerance  Patient tolerated treatment well    Behavior During Therapy  WFL for tasks assessed/performed       Past Medical History:  Diagnosis Date  . Chicken pox     Past Surgical History:  Procedure Laterality Date  . LUMBAR LAMINECTOMY/DECOMPRESSION MICRODISCECTOMY N/A 08/01/2017   Procedure: Thoracic nine, ten, eleven, twelve laminectomy;  Surgeon: Tia Alert, MD;  Location: [facility] OR;  Service: Neurosurgery;  Laterality: N/A;  . none      There were no vitals filed for this visit.  Subjective Assessment - 08/15/17 0806    Subjective  No questions from yesterday; no significant fatigue or soreness after yesterday.    Patient is accompained by:  Family member    Pertinent History  None - very active    Limitations  Standing;Walking    How long can you stand comfortably?  8-10 minutes    How long can you walk comfortably?  uses cane and RW household distance, RW for community distances    Diagnostic tests  MRI     Patient Stated Goals  To be more independent at home and improve LE strength    Currently in Pain?  No/denies         [facility] PT Assessment - 08/15/17 0806      Tone   Assessment Location  Right Lower Extremity;Left Lower Extremity      Standardized Balance Assessment   Standardized Balance Assessment  Berg  Balance Test      Berg Balance Test   Sit to Stand  Able to stand  independently using hands    Standing Unsupported  Able to stand 2 minutes with supervision    Sitting with Back Unsupported but Feet Supported on Floor or Stool  Able to sit safely and securely 2 minutes    Stand to Sit  Controls descent by using hands    Transfers  Able to transfer safely, definite need of hands    Standing Unsupported with Eyes Closed  Able to stand 3 seconds    Standing Ubsupported with Feet Together  Needs help to attain position but able to stand for 30 seconds with feet together    From Standing, Reach Forward with Outstretched Arm  Loses balance while trying/requires external support limited due to back precautions    From Standing Position, Pick up Object from Floor  Unable to try/needs assist to keep balance limited due to back precautions    From Standing Position, Turn to Look Behind Over each Shoulder  Needs supervision when turning    Turn 360 Degrees  Needs assistance while turning    Standing Unsupported, Alternately Place Feet on Step/Stool  Able to complete >2 steps/needs minimal assist  Standing Unsupported, One Foot in Front  Needs help to step but can hold 15 seconds    Standing on One Leg  Unable to try or needs assist to prevent fall    Total Score  22    Berg comment:  22/56      RLE Tone   RLE Tone  Other (comment);Modified Ashworth 2-3 beats clonus      RLE Tone   Modified Ashworth Scale for Grading Hypertonia RLE  No increase in muscle tone      LLE Tone   LLE Tone  Mild;Modified Ashworth 3-4 beats clonus      LLE Tone   Modified Ashworth Scale for Grading Hypertonia LLE  Slight increase in muscle tone, manifested by a catch and release or by minimal resistance at the end of the range of motion when the affected part(s) is moved in flexion or extension       Reviewed the following home stretching program with pt and wife:  Supine Hamstring Stretch    Keep other leg  bent, strap belt around foot and lift leg until tension is felt.  Keep knee slightly bent.  Hold 30 seconds.  Repeat with other leg. Repeat __2__ times each leg. Do __2__ sessions per day.  Piriformis Stretch, Supine    Lie supine, one ankle crossed onto opposite knee. Keep pelvis stabilized.  Hold __30_ seconds. For deeper stretch gently push top knee away from body.  Repeat __2_ times per leg. Do _2__ sessions per day.  Butterfly, Supine    Lie on back, feet together. Lower knees toward floor. Hold _30__ seconds. Repeat _2__ times per session. Do _2__ sessions per day.  Place pillows under knees for support if needed.  Copyright  VHI. All rights reserved.         PT Education - 08/15/17 1716    Education provided  Yes    Education Details  falls risk based on BERG, LE stretching program and importance of not overstretching weakened muscles    Person(s) Educated  Patient;Spouse    Methods  Explanation;Demonstration;Handout    Comprehension  Verbalized understanding;Returned demonstration       PT Short Term Goals - 08/15/17 1730      PT SHORT TERM GOAL #1   Title  Pt will participate in further assessment of standing balance with BERG    Time  4    Period  Weeks    Status  Achieved      PT SHORT TERM GOAL #2   Title  Pt will participate in initial fitting and training of bilat Bioness    Time  4    Period  Weeks    Status  New    Target Date  09/13/17      PT SHORT TERM GOAL #3   Title  Pt will improve five time sit <> stand from arm chair to </= 15 seconds with more equal WB through bilat LE and improved control during stand > sit    Baseline  13 seconds but with L foot forwards, pushing up with UE and uncontrolled descent    Time  4    Period  Weeks    Status  New    Target Date  09/13/17      PT SHORT TERM GOAL #4   Title  Pt will improve gait velocity with RW to >/= 1.8 ft/sec    Baseline  1.6 ft/sec with RW    Time  4  Period  Weeks    Status   New    Target Date  09/13/17      PT SHORT TERM GOAL #5   Title  Pt will negotiate 12 stairs with 2 rails, alternating sequence with supervision    Time  4    Period  Weeks    Status  New    Target Date  09/13/17        PT Long Term Goals - 08/14/17 1232      PT LONG TERM GOAL #1   Title  Pt will be independent with balance and LE strengthening HEP    Time  8    Period  Weeks    Status  New    Target Date  10/13/17      PT LONG TERM GOAL #2   Title  Pt will improve standing balance and BERG by 8 points to decrease falls risk    Baseline  TBD    Time  8    Period  Weeks    Status  New    Target Date  10/13/17      PT LONG TERM GOAL #3   Title  Pt will improve gait velocity to >/= 2.0 ft/sec with appropriate AFO and LRAD    Time  8    Period  Weeks    Status  New    Target Date  10/13/17      PT LONG TERM GOAL #4   Title  Pt will participate in AFO assessment if foot slap continues after 8 weeks of training with Bioness    Time  8    Period  Weeks    Status  New    Target Date  10/13/17      PT LONG TERM GOAL #5   Title  Pt will improve LE strength to perform five time sit <> stand without UE support in </= 15 seconds    Time  8    Period  Weeks    Status  New    Target Date  10/13/17      Additional Long Term Goals   Additional Long Term Goals  Yes      PT LONG TERM GOAL #6   Title  Pt will ambulate x 300' over indoor and outdoor paved surfaces with cane/appropriate AFO and supervision; will negotiate 12 stairs, 2 rails alternating sequence MOD I    Time  8    Period  Weeks    Status  New    Target Date  10/13/17            Plan - 08/15/17 1717    Clinical Impression Statement  Treatment session focused on assessment of balance and falls risk with BERG balance assessment; pt does demonstrate high falls risk and increased need for external support.  Performed assessment of LE tone and ROM and initiated LE stretching program for home with use of belt  and wife assisting with maintaining LE position and pelvis position.  Discussed importance of not over stretching weaker muscles.  Pt and wife verbalized understanding.  Will continue to address and progress towards LTG.    Rehab Potential  Good    Clinical Impairments Affecting Rehab Potential  will have to have cervical laminectomy soon    PT Frequency  2x / week    PT Duration  8 weeks    PT Treatment/Interventions  ADLs/Self Care Home Management;Cryotherapy;Electrical Stimulation;Moist Heat;DME Instruction;Gait Network engineer;Therapeutic activities;Functional mobility training;Therapeutic exercise;Balance  training;Neuromuscular re-education;Patient/family education;Orthotic Fit/Training;Passive range of motion;Taping    PT Next Visit Plan   assess Bioness bilaterally ankle DF and hamstring.  LE strengthening HEP, balance HEP based on BERG    PT Home Exercise Plan  stretching, core and bilat LE strengthening especially hip flexion, HS, DF    Consulted and Agree with Plan of Care  Patient;Family member/caregiver    Family Member Consulted  wife - "T"       Patient will benefit from skilled therapeutic intervention in order to improve the following deficits and impairments:  Abnormal gait, Decreased balance, Decreased coordination, Decreased strength, Difficulty walking, Increased muscle spasms, Impaired sensation, Pain  Visit Diagnosis: Paraplegia, incomplete (HCC)  Muscle weakness (generalized)  Other abnormalities of gait and mobility  Other disturbances of skin sensation  Other symptoms and signs involving the nervous system  Unsteadiness     Problem List Patient Active Problem List   Diagnosis Date Noted  . Indigestion   . Acute pain of left knee   . Postoperative pain   . Leukocytosis   . Anxiety state   . Elevated blood pressure reading   . Thoracic myelopathy 08/05/2017  . Neuropathic pain   . Muscle spasm   . Drug induced constipation   . Urinary  retention   . Class 1 obesity due to excess calories with serious comorbidity and body mass index (BMI) of 33.0 to 33.9 in adult   . Myelopathy (HCC) 08/01/2017  . Thoracic spinal stenosis 07/31/2017  . Obesity (BMI 30.0-34.9) 05/17/2017    Dierdre Highman, PT, DPT 08/15/17    5:33 PM    [facility] [address] [place], [place], [address] Phone: [phone]   Fax:  [phone]  Name: Scott Lane MRN: [id number] Date of Birth: Jul 07, 1985

## 2017-08-16 ENCOUNTER — Encounter: Payer: Self-pay | Admitting: Physical Medicine & Rehabilitation

## 2017-08-16 ENCOUNTER — Encounter: Payer: 59 | Attending: Physical Medicine & Rehabilitation | Admitting: Physical Medicine & Rehabilitation

## 2017-08-16 ENCOUNTER — Other Ambulatory Visit: Payer: Self-pay | Admitting: *Deleted

## 2017-08-16 VITALS — BP 133/84 | HR 99 | Ht 72.0 in | Wt 249.0 lb

## 2017-08-16 DIAGNOSIS — K5901 Slow transit constipation: Secondary | ICD-10-CM

## 2017-08-16 DIAGNOSIS — Z8249 Family history of ischemic heart disease and other diseases of the circulatory system: Secondary | ICD-10-CM | POA: Insufficient documentation

## 2017-08-16 DIAGNOSIS — R269 Unspecified abnormalities of gait and mobility: Secondary | ICD-10-CM

## 2017-08-16 DIAGNOSIS — R339 Retention of urine, unspecified: Secondary | ICD-10-CM

## 2017-08-16 DIAGNOSIS — M961 Postlaminectomy syndrome, not elsewhere classified: Secondary | ICD-10-CM | POA: Diagnosis not present

## 2017-08-16 DIAGNOSIS — M62838 Other muscle spasm: Secondary | ICD-10-CM

## 2017-08-16 DIAGNOSIS — G959 Disease of spinal cord, unspecified: Secondary | ICD-10-CM | POA: Diagnosis not present

## 2017-08-16 DIAGNOSIS — Z801 Family history of malignant neoplasm of trachea, bronchus and lung: Secondary | ICD-10-CM | POA: Insufficient documentation

## 2017-08-16 DIAGNOSIS — G8918 Other acute postprocedural pain: Secondary | ICD-10-CM | POA: Diagnosis not present

## 2017-08-16 DIAGNOSIS — K5900 Constipation, unspecified: Secondary | ICD-10-CM | POA: Insufficient documentation

## 2017-08-16 DIAGNOSIS — M4714 Other spondylosis with myelopathy, thoracic region: Secondary | ICD-10-CM | POA: Diagnosis not present

## 2017-08-16 DIAGNOSIS — M792 Neuralgia and neuritis, unspecified: Secondary | ICD-10-CM | POA: Diagnosis not present

## 2017-08-16 DIAGNOSIS — N319 Neuromuscular dysfunction of bladder, unspecified: Secondary | ICD-10-CM | POA: Diagnosis not present

## 2017-08-16 DIAGNOSIS — M4804 Spinal stenosis, thoracic region: Secondary | ICD-10-CM | POA: Diagnosis not present

## 2017-08-16 MED ORDER — POLYETHYLENE GLYCOL 3350 17 G PO PACK: 17.0000 g | PACK | Freq: Two times a day (BID) | ORAL | 1 refills | Status: DC

## 2017-08-16 MED ORDER — SENNOSIDES-DOCUSATE SODIUM 8.6-50 MG PO TABS: 2.0000 | ORAL_TABLET | Freq: Every day | ORAL | 1 refills | Status: DC

## 2017-08-16 NOTE — Progress Notes (Signed)
Subjective:    Patient ID: Scott Lane, male    DOB: 14-Mar-1986, 32 y.o.   MRN: [id number]  HPI This is a 32 year old right-handed male with unremarkable past medical history presents for transitional care management after receiving CIR for thoracic myelopathy.   Date of admission 08/05/2017 DATE OF DISCHARGE:  08/12/2017  Wife supplements history. At discharge, he was instructed to follow up with Neurosurg, whom he has an appointment with Tuesday.  He sees PCP next week. He completed steroids.  He complains of constipation. Pain is improving.  Bladder function is improving. Left knee has improved.  Therapies: 2/week. Mobility: Rolling walker at all times DME: Purchased own shower chair.  Pain Inventory Average Pain 1 Pain Right Now 0 My pain is intermittent and burning  In the last 24 hours, has pain interfered with the following? General activity 0 Relation with others 0 Enjoyment of life 0 What TIME of day is your pain at its worst? night Sleep (in general) Good  Pain is worse with: sitting and inactivity Pain improves with: pacing activities Relief from Meds: 8  Mobility walk with assistance use a walker ability to climb steps?  yes do you drive?  no  Function employed # of hrs/week . 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 Hypertension Mother   . Hypertension Father   . Lung cancer Father        smoker  . Hypertension Brother   . Hypertension Maternal Grandmother   . Lung cancer Maternal Grandmother        non smoker  . Hypertension Paternal Grandmother    Social History   Socioeconomic History  . Marital status: Married    Spouse name: Not on file  . Number of children: Not on file  . Years of education: Not on file  .  Highest education level: Not on file  Occupational History  . Not on file  Social Needs  . Financial resource strain: Not on file  . Food insecurity:    Worry: Not on file    Inability: Not on file  . Transportation needs:    Medical: Not on file    Non-medical: Not on file  Tobacco Use  . Smoking status: Never Smoker  . Smokeless tobacco: Never Used  Substance and Sexual Activity  . Alcohol use: Yes    Alcohol/week: 1.2 oz    Types: 2 Standard drinks or equivalent per week  . Drug use: No  . Sexual activity: Yes  Lifestyle  . Physical activity:    Days per week: Not on file    Minutes per session: Not on file  . Stress: Not on file  Relationships  . Social connections:    Talks on phone: Not on file    Gets together: Not on file    Attends religious service: Not on file    Active member of club or organization: Not on file    Attends meetings of clubs or organizations: Not on file    Relationship status: Not on file  Other Topics Concern  . Not on file  Social History Narrative   Married 2018. Wife pregnant with first child- daughter.    Wife works IT with [employer].       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 Spears every morning.    Past Surgical History:  Procedure Laterality Date  . LUMBAR LAMINECTOMY/DECOMPRESSION MICRODISCECTOMY N/A 08/01/2017   Procedure: Thoracic nine, ten, eleven, twelve laminectomy;  Surgeon: Tia Alert, MD;  Location: [facility] OR;  Service: Neurosurgery;  Laterality: N/A;  . none     Past Medical History:  Diagnosis Date  . Chicken pox    There were no vitals taken for this visit.  Opioid Risk Score:   Fall Risk Score:  `1  Depression screen PHQ 2/9  Depression screen PHQ 2/9 05/17/2017  Decreased Interest 0  Down, Depressed, Hopeless 0  PHQ - 2 Score 0    Review of Systems  Gastrointestinal:  Positive for abdominal pain and constipation.  Musculoskeletal: Positive for back pain and gait problem.       Spasms        Objective:   Physical Exam Constitutional: He appears well-developed. NAD. HENT: Normocephalic and atraumatic.  Eyes: EOM are normal. No discharge.  Cardiovascular: RRR. No JVD. Respiratory: Effort normal and breath sounds normal.  GI: Bowel sounds are normal. He exhibits no distension.  Musc: Gait: Slow cadence No edema or tenderness in extremities Neurological: Patient is alert and oriented.   Follows full commands.  Motor: B/l UE 5/5 proximal to distal RLE: 4-4+/5 proximal to distal  LLE: 4-4+/5 proximal to distal  Skin.  Surgical incision healed    Assessment & Plan:  This is a 32 year old right-handed male with unremarkable past medical history presents for transitional care management after receiving CIR for thoracic myelopathy.   1. Thoracic stenosis with myelopathy, s/p decompressive thoracic laminectomy T9-10 and T10-11, T11-12.  No back brace required  Cont therapies  Follow up with Neurosurg  2. Pain Management  Relatively controlled at present  Cont to wean Hydocodone, no refills  Cont Robaxin PRN  3. Constipation.    Encouraged Miralax BID and Seena 2 tabs qhs  Will order Linzess if ineffective  4.  Urinary retention.   Appears to be improving  5. Gait abnormality  Cont therapies  Cont walker for safety

## 2017-08-16 NOTE — Patient Outreach (Signed)
[facility]) Care Management  08/16/2017  Scott Lane Oct 04, 1985 [id number]   Subjective: Telephone call to patient's home  / mobile number, no answer, left HIPAA compliant voicemail message, and requested call back.     Objective:  Per KPN (Knowledge Performance Now, point of care tool) and chart review, patient hospitalized inpatient rehab 08/05/17 - 08/12/17 for Thoracic stenosis with myelopathy, status post decompressive thoracic laminectomy on 08/01/17.    Patient also hospitalized 07/31/17 -08/05/17 for  thoracic stenosis with myelopathy.        Assessment: Received [facility]MR Transition of care referral on 08/05/17.    Transition of care follow up pending patient contact.       Plan: Unsuccessful outreach  letter, [facility] pamphlet, have been sent, will call patient for 3rnd telephone outreach attempt, transition of care follow up, and proceed with case closure, within 10 business days if no return call.       Serenna Deroy H. Gardiner Barefoot, BSN, CCM [facility] Care Management [facility] Telephonic CM Phone: [phone] Fax: [phone]

## 2017-08-19 ENCOUNTER — Other Ambulatory Visit: Payer: Self-pay | Admitting: *Deleted

## 2017-08-19 NOTE — Patient Outreach (Addendum)
[facility]) Care Management  08/19/2017  Scott Lane 1986-02-21 [id number]   Subjective: Received voicemail message from Mr. Scott Lane, states he is returning call, and requested call back. Telephone call to patient's home / mobile number, spoke with patient, and HIPAA verified.  Discussed [facility] Care Management [facility] Transition of care follow up, patient voiced understanding, and is in agreement to follow up.   Patient gave verbal authorization to speak with wife Scott Lane) regarding his healthcare needs as needed.   Patient states he is doing well, attending outpatient rehab, has follow up appointment with surgeon on 08/16/17, and appointment went well.  States he has another follow up appointment with surgeon on 08/20/17 and primary MD on 08/23/17.   Patient states he is able to manage self care and has assistance as needed with activities of daily living.  Patient voices understanding of medical diagnosis, surgery, and treatment plan. States he is accessing the following [employer] benefits: outpatient pharmacy, hospital indemnity (not sure if benefit request RNCM call wife to verify), and wife who is [employer] Employee did take time off, not sure if she used paid annual leave or family medical leave act (FMLA).   Patient states he does not have any education material, transition of care, care coordination, disease management, disease monitoring, transportation, community resource, or pharmacy needs at this time.  States he is very appreciative of the follow up and is in agreement to receive [facility] Care Management information.  Telephone call to patient's wife times 2, states patient's name, address, date of birth, advised of above conversation with patient, and states she does have hospital indemnity (verbally given contact number for Leonie Douglas [phone], will verify if patient has the benefit, will file claim if appropriate, verbally given contact number for [facility] Patient Accounting  [phone] to request itemized bill).  Sates she is pregnant, will be using FMLA when baby born, is participating in the Health Pregnancy / Baby Scripts program with [facility] Care Management, and did not use FMLA ,used Paid Annual Leave (PAL) time to be off with husband.   States she is very appreciative of the follow up.       Objective:  Per KPN (Knowledge Performance Now, point of care tool) and chart review, patient hospitalized inpatient rehab 08/05/17 - 08/12/17 for Thoracic stenosis with myelopathy, status post decompressive thoracic laminectomy on 08/01/17.    Patient also hospitalized 07/31/17 -08/05/17 for  thoracic stenosis with myelopathy.        Assessment: Received UMR Transition of care referral on 08/05/17.    Transition of care follow up completed, no care management needs, and will proceed with case closure.        Plan: RNCM will send patient successful outreach letter, [facility] pamphlet, and magnet. RNCM will complete case closure due to follow up completed / no care management needs.       [name] H. Gardiner Barefoot, BSN, CCM [facility] Care Management [facility] Telephonic CM Phone: [phone] Fax: [phone]

## 2017-08-20 ENCOUNTER — Inpatient Hospital Stay: Payer: 59 | Admitting: Family Medicine

## 2017-08-22 ENCOUNTER — Encounter: Payer: 59 | Admitting: Physical Medicine & Rehabilitation

## 2017-08-23 ENCOUNTER — Encounter: Payer: Self-pay | Admitting: Physical Therapy

## 2017-08-23 ENCOUNTER — Encounter: Payer: Self-pay | Admitting: Family Medicine

## 2017-08-23 ENCOUNTER — Ambulatory Visit (INDEPENDENT_AMBULATORY_CARE_PROVIDER_SITE_OTHER): Payer: 59 | Admitting: Family Medicine

## 2017-08-23 ENCOUNTER — Ambulatory Visit: Payer: 59 | Admitting: Physical Therapy

## 2017-08-23 ENCOUNTER — Encounter

## 2017-08-23 VITALS — BP 108/86 | HR 110 | Temp 99.3°F | Ht 72.0 in | Wt 235.6 lb

## 2017-08-23 DIAGNOSIS — M62838 Other muscle spasm: Secondary | ICD-10-CM | POA: Diagnosis not present

## 2017-08-23 DIAGNOSIS — R2681 Unsteadiness on feet: Secondary | ICD-10-CM

## 2017-08-23 DIAGNOSIS — G8222 Paraplegia, incomplete: Secondary | ICD-10-CM | POA: Diagnosis not present

## 2017-08-23 DIAGNOSIS — R208 Other disturbances of skin sensation: Secondary | ICD-10-CM | POA: Diagnosis not present

## 2017-08-23 DIAGNOSIS — R29818 Other symptoms and signs involving the nervous system: Secondary | ICD-10-CM | POA: Diagnosis not present

## 2017-08-23 DIAGNOSIS — R2689 Other abnormalities of gait and mobility: Secondary | ICD-10-CM

## 2017-08-23 DIAGNOSIS — M4804 Spinal stenosis, thoracic region: Secondary | ICD-10-CM

## 2017-08-23 DIAGNOSIS — D72829 Elevated white blood cell count, unspecified: Secondary | ICD-10-CM

## 2017-08-23 DIAGNOSIS — K5903 Drug induced constipation: Secondary | ICD-10-CM | POA: Diagnosis not present

## 2017-08-23 DIAGNOSIS — Z23 Encounter for immunization: Secondary | ICD-10-CM

## 2017-08-23 DIAGNOSIS — M6281 Muscle weakness (generalized): Secondary | ICD-10-CM | POA: Diagnosis not present

## 2017-08-23 MED ORDER — AMOXICILLIN-POT CLAVULANATE 875-125 MG PO TABS: 1.0000 | ORAL_TABLET | Freq: Two times a day (BID) | ORAL | 0 refills | Status: AC

## 2017-08-23 NOTE — Progress Notes (Signed)
Subjective:  Scott Lane is a 32 y.o. year old very pleasant male patient who presents for/with See problem oriented charting ROS- reports minimal back pain or leg pain- still with some weakness in left leg. No falls. No fever or chills. Scar healing well   Past Medical History-  Patient Active Problem List   Diagnosis Date Noted  . Neuropathic pain     Priority: Medium  . Drug induced constipation     Priority: Medium  . Myelopathy (HCC) 08/01/2017    Priority: Medium  . Thoracic spinal stenosis 07/31/2017    Priority: Medium  . Leukocytosis     Priority: Low  . Urinary retention     Priority: Low  . Class 1 obesity due to excess calories with serious comorbidity and body mass index (BMI) of 33.0 to 33.9 in adult     Priority: Low    Medications- reviewed and updated Current Outpatient Medications  Medication Sig Dispense Refill  . amoxicillin-clavulanate (AUGMENTIN) 875-125 MG tablet Take 1 tablet by mouth 2 (two) times daily for 7 days. 14 tablet 0   No current facility-administered medications for this visit.    Lab Results  Component Value Date   WBC 12.5 (H) 08/09/2017   HGB 14.4 08/09/2017   HCT 42.1 08/09/2017   MCV 87.7 08/09/2017   PLT 297 08/09/2017    Objective: BP 108/86 (BP Location: Left Arm, Patient Position: Sitting, Cuff Size: Normal)   Pulse (!) 110   Temp 99.3 F (37.4 C) (Oral)   Ht 6' (1.829 m)   Wt 235 lb 9.6 oz (106.9 kg)   SpO2 96%   BMI 31.95 kg/m  Gen: NAD, resting comfortably CV: RRR (tachycardia had resolved by my exam) no murmurs rubs or gallops Lungs: CTAB no crackles, wheeze, rhonchi Abdomen: soft/nontender/nondistended/normal bowel sounds. No rebound or guarding. Around abdomen does note some decreased gross touch sensation compared to chest Ext: no edema Skin: warm, dry Neuro: 4-/5 left lower extremity, 4+5 right lower extremity, normal upper extremity strength, walks with walker  Assessment/Plan:  Thoracic stenosis with  myelopathy, s/p decompressive thoracic laminectomy T9-t10, t10-t11, t11-t12.  Abdominal numbness Constipation Urinary retention S: Patient is a 33 year old male who presented to the hospital on 08/01/27 with persistent lower extremity weakness and numbness. Patient reports that 2-3 weeks prior to going to the hospital he had noted legs felt somewhat heavy and weak- he thought perhaps he had done too much cardio. aout a week prior to hospitalization he started to notice he was walking with a limp. Went to see a chiropractor who diagnosed him as having a bulging disc. He had a few visits but patient was not progressing so plan had been to call us here to continue prednisone taper. Before that happened- patient was out playing golf because he had an upcoming tournament. He felt somewhat Jittery and fatigue then went to toss a ball to his friend- he felt a shock like sensation into his legs then he fell to the ground to his side. He heard a pop. He couldn't feel his legs at first- had persistent lack of sensation in left foot and when rescue squad arrived thay transported him to the hospital.   At the hospital he was found to have severe spinal stenosis at T9-T12 with cord edema. He had decompressive thoracic laminectomy T9-t10, t10-t11, t11-t12 with Dr. Marikay Alar. He was placed on decadron protocol- which he has now finished. He also had left knee films which were  negative for acute injury.   Constipation was managed with laxitives prn. Patient is now using linzess about every other day if doesn't have BM- had been taking q3 days for BM. Also taking colace. Not on miralax but has avialabale.   Had some urinary retention requiring flomax at first.   He had PT and OT eval and recommended stay with [facility]. He was discharged from [facility] on 08/12/17   Patient has been feeling much better lately- still walking with a walker but really wants to walk with a cane. He has already seen Dr. Allena Katz and has another follow up  in mid June. He is going to PT twice a week.   His pain is well controlled- barely taking tylenol. Reports occasional back spasms but doesn't take anything for them. 0/10 pain reported at the moment.   He has follow up with Dr. Marikay Alar of neurosurgery and the plan is most likely MRI cervical spine and likely C4-c6 surgery.   He also reports some symptoms of sinusitis. He states that after getting out of hospital noted sinus drainage with yellow discharge along with sinus headache. Thought perhaps allergies at first but just not getting better despite mucinex D. In first few days had temperature up to 99.8 but no elevated temperature since then.   Social history- Patient has a baby due June 10th but luckily has his mom and mother in law close. They are both retired and going to help out with the kids- his mom is actually going to wath the baby daily.  A/P: Thoracic spinal stenosis Symptoms improved after decompressive thoracic laminectomy T9-t10, t10-t11, t11-t12. He has planned follow up with neurosurgery. He is continuing with PT/OT. Has weakness in both legs worse on left. Also has some numbness and squeezing sensation around the abdomen. Urinary retention has improved and off flomax. See constipation section.   Drug induced constipation Not using narcotics at home. Still with constipation. May be more related to reduced mobility than drugs at this point. He is using linzness and colace. I encouraged him to perhaps try half capful of miralax daily and hold for loose stools.   Muscle spasm Having some spasms but not using muscle relaxants. Will resolve issues  Leukocytosis Likely related to decadron/steroids. Consider repeat CBC with diff next visit.   Bacterial sinusitis- based on over 10 days of yellow discharge, sinus pressure- will cover with augmentin  Future Appointments  Date Time Provider Department Center  08/26/2017  8:00 AM Dierdre Highman, PT [facility]  08/30/2017  8:45  AM Berneice Heinrich, PT [facility]  09/05/2017  3:30 PM Dierdre Highman, PT [facility]  09/06/2017  2:45 PM Berneice Heinrich, PT [facility]  09/11/2017  8:00 AM Dierdre Highman, PT [facility]  09/13/2017  8:00 AM Dierdre Highman, PT [facility]  09/18/2017  8:00 AM Sasser, Scherrie November, PT [facility]  09/19/2017  9:20 AM Marcello Fennel, MD [facility]  09/20/2017  8:00 AM Dierdre Highman, PT [facility]  09/23/2017  8:00 AM Dierdre Highman, PT [facility]  09/25/2017  8:00 AM Dierdre Highman, PT [facility]  09/30/2017  8:00 AM Dierdre Highman, PT [facility]  10/02/2017  8:00 AM Dierdre Highman, PT [facility]  10/07/2017  8:00 AM Dierdre Highman, PT [facility]  10/09/2017  8:00 AM Dierdre Highman, PT [facility]  10/14/2017  8:00 AM Dierdre Highman, PT [facility]  10/16/2017  8:00 AM Dierdre Highman, PT [facility]   Lab/Order associations: Need for prophylactic vaccination with combined diphtheria-tetanus-pertussis (DTP) vaccine - Plan: Tdap vaccine greater than or equal to 7yo IM  Meds ordered this encounter  Medications  . amoxicillin-clavulanate (AUGMENTIN) 875-125 MG tablet    Sig: Take 1 tablet by mouth 2 (two) times daily for 7 days.    Dispense:  14 tablet    Refill:  0    Return precautions advised.  Tana Conch, MD

## 2017-08-23 NOTE — Patient Instructions (Addendum)
Health Maintenance Due  Topic Date Due  . TETANUS/TDAP - Today at Office visit 01/20/2004   augmentin for 7 days for sinus infection. See [place] back if doesn't resolved. Sometimes we use prednisone but with how much you had to have lets hold off!   See [place] back for new or worsening symptoms in regards to sinuses or if have questions about your progress

## 2017-08-24 NOTE — Assessment & Plan Note (Signed)
Not using narcotics at home. Still with constipation. May be more related to reduced mobility than drugs at this point. He is using linzness and colace. I encouraged him to perhaps try half capful of miralax daily and hold for loose stools.

## 2017-08-24 NOTE — Assessment & Plan Note (Signed)
Symptoms improved after decompressive thoracic laminectomy T9-t10, t10-t11, t11-t12. He has planned follow up with neurosurgery. He is continuing with PT/OT. Has weakness in both legs worse on left. Also has some numbness and squeezing sensation around the abdomen. Urinary retention has improved and off flomax. See constipation section.

## 2017-08-24 NOTE — Assessment & Plan Note (Signed)
Likely related to decadron/steroids. Consider repeat CBC with diff next visit.

## 2017-08-24 NOTE — Assessment & Plan Note (Signed)
Having some spasms but not using muscle relaxants. Will resolve issues

## 2017-08-25 NOTE — Therapy (Signed)
[facility] [address] [place], [place], [address] Phone: [phone]   Fax:  [phone]  Physical Therapy Treatment  Patient Details  Name: Scott Lane MRN: [id number] Date of Birth: [birth year] Referring Provider: Maryla Morrow, MD   Encounter Date: 08/23/2017  PT End of Session - 08/25/17 1209    Visit Number  3    Number of Visits  17    Date for PT Re-Evaluation  10/13/17    Authorization Type  [place] Family Deductible.  VL: MN    PT Start Time  0805    PT Stop Time  0850    PT Time Calculation (min)  45 min    Equipment Utilized During Treatment  Other (comment) Bioness    Activity Tolerance  Patient tolerated treatment well    Behavior During Therapy  WFL for tasks assessed/performed       Past Medical History:  Diagnosis Date  . Chicken pox     Past Surgical History:  Procedure Laterality Date  . LUMBAR LAMINECTOMY/DECOMPRESSION MICRODISCECTOMY N/A 08/01/2017   Procedure: Thoracic nine, ten, eleven, twelve laminectomy;  Surgeon: Tia Alert, MD;  Location: [facility] OR;  Service: Neurosurgery;  Laterality: N/A;  . none      There were no vitals filed for this visit.      08/23/17 0807  Symptoms/Limitations  Subjective Doing well but LLE is still very weak.  If he attempts to walk with one UE support LLE begins to tremble.  Had appointment with Dr. Yetta Barre and Dr. Allena Katz who were pleased with his progress so far.  Patient is accompained by: Family member  Pertinent History None - very active  Limitations Standing;Walking  How long can you stand comfortably? 8-10 minutes  How long can you walk comfortably? uses cane and RW household distance, RW for community distances  Diagnostic tests MRI   Patient Stated Goals To be more independent at home and improve LE strength  Pain Assessment  Currently in Pain? No/denies                   [facility] Adult PT Treatment/Exercise - 08/25/17 1159       Ambulation/Gait   Ambulation/Gait  Yes    Ambulation/Gait Assistance  4: Min assist    Ambulation/Gait Assistance Details  gait with Bioness in gait mode with focus on increased hamstring and ankle DF activation for improved foot clearance > heel strike, anterior translation of tibia during mid stance and improved knee control in stance.    Ambulation Distance (Feet)  115 Feet    Assistive device  Rolling walker    Gait Pattern  Step-through pattern;Decreased hip/knee flexion - left;Decreased dorsiflexion - left;Left genu recurvatum;Decreased trunk rotation;Poor foot clearance - left    Ambulation Surface  Level;Indoor      Neuro Re-ed    Neuro Re-ed Details   NMR for LLE closed chain ankle DF and hamstring activation with Bioness in training mode while performing on bottom step: LLE on bottom step shifting weight forwards and extending with increased control in L knee to tap RLE up to second step and then back down to floor for concentric and eccentric training.  Pt noted to be pulling significantly through UE.        Modalities   Modalities  Geologist, engineering Location  bilat anterior tibialis, L hamstring mm    Electrical Stimulation Action  closed  chain ankle DF, knee flexion, hip extension    Electrical Stimulation Parameters  see saved parameters, quick fit electrodes but may need steering on LLE    Electrical Stimulation Goals  Strength;Neuromuscular facilitation             PT Education - 08/25/17 1209    Education provided  Yes    Education Details  use of Bioness for gait and strength training    Person(s) Educated  Patient    Methods  Explanation    Comprehension  Verbalized understanding       PT Short Term Goals - 08/15/17 1730      PT SHORT TERM GOAL #1   Title  Pt will participate in further assessment of standing balance with BERG    Time  4    Period  Weeks    Status  Achieved      PT SHORT  TERM GOAL #2   Title  Pt will participate in initial fitting and training of bilat Bioness    Time  4    Period  Weeks    Status  New    Target Date  09/13/17      PT SHORT TERM GOAL #3   Title  Pt will improve five time sit <> stand from arm chair to </= 15 seconds with more equal WB through bilat LE and improved control during stand > sit    Baseline  13 seconds but with L foot forwards, pushing up with UE and uncontrolled descent    Time  4    Period  Weeks    Status  New    Target Date  09/13/17      PT SHORT TERM GOAL #4   Title  Pt will improve gait velocity with RW to >/= 1.8 ft/sec    Baseline  1.6 ft/sec with RW    Time  4    Period  Weeks    Status  New    Target Date  09/13/17      PT SHORT TERM GOAL #5   Title  Pt will negotiate 12 stairs with 2 rails, alternating sequence with supervision    Time  4    Period  Weeks    Status  New    Target Date  09/13/17        PT Long Term Goals - 08/14/17 1232      PT LONG TERM GOAL #1   Title  Pt will be independent with balance and LE strengthening HEP    Time  8    Period  Weeks    Status  New    Target Date  10/13/17      PT LONG TERM GOAL #2   Title  Pt will improve standing balance and BERG by 8 points to decrease falls risk    Baseline  TBD    Time  8    Period  Weeks    Status  New    Target Date  10/13/17      PT LONG TERM GOAL #3   Title  Pt will improve gait velocity to >/= 2.0 ft/sec with appropriate AFO and LRAD    Time  8    Period  Weeks    Status  New    Target Date  10/13/17      PT LONG TERM GOAL #4   Title  Pt will participate in AFO assessment if foot slap continues after 8 weeks of  training with Bioness    Time  8    Period  Weeks    Status  New    Target Date  10/13/17      PT LONG TERM GOAL #5   Title  Pt will improve LE strength to perform five time sit <> stand without UE support in </= 15 seconds    Time  8    Period  Weeks    Status  New    Target Date  10/13/17       Additional Long Term Goals   Additional Long Term Goals  Yes      PT LONG TERM GOAL #6   Title  Pt will ambulate x 300' over indoor and outdoor paved surfaces with cane/appropriate AFO and supervision; will negotiate 12 stairs, 2 rails alternating sequence MOD I    Time  8    Period  Weeks    Status  New    Target Date  10/13/17            Plan - 08/25/17 1210    Clinical Impression Statement  Performed set up of functional electrical stimulation and initiated gait training and LE strengthening and NMR with Bioness.  Pt may benefit from use of L steering electrode to improve sequencing of ankle DF.  Will continue to address and progress towards LTG.    Rehab Potential  Good    Clinical Impairments Affecting Rehab Potential  will have to have cervical laminectomy soon    PT Frequency  2x / week    PT Duration  8 weeks    PT Treatment/Interventions  ADLs/Self Care Home Management;Cryotherapy;Electrical Stimulation;Moist Heat;DME Instruction;Gait Network engineer;Therapeutic activities;Functional mobility training;Therapeutic exercise;Balance training;Neuromuscular re-education;Patient/family education;Orthotic Fit/Training;Passive range of motion;Taping    PT Next Visit Plan   continue Bioness bilaterally ankle DF and hamstring.  LE strengthening HEP (sit to stand, hip flex, HS, DF), balance HEP based on BERG    PT Home Exercise Plan  stretching, core and bilat LE strengthening especially hip flexion, HS, DF    Consulted and Agree with Plan of Care  Patient;Family member/caregiver    Family Member Consulted  wife - "T"       Patient will benefit from skilled therapeutic intervention in order to improve the following deficits and impairments:  Abnormal gait, Decreased balance, Decreased coordination, Decreased strength, Difficulty walking, Increased muscle spasms, Impaired sensation, Pain  Visit Diagnosis: Other symptoms and signs involving the nervous system  Paraplegia,  incomplete (HCC)  Muscle weakness (generalized)  Other abnormalities of gait and mobility  Other disturbances of skin sensation  Unsteadiness     Problem List Patient Active Problem List   Diagnosis Date Noted  . Leukocytosis   . Neuropathic pain   . Drug induced constipation   . Urinary retention   . Class 1 obesity due to excess calories with serious comorbidity and body mass index (BMI) of 33.0 to 33.9 in adult   . Myelopathy (HCC) 08/01/2017  . Thoracic spinal stenosis 07/31/2017   Dierdre Highman, PT, DPT 08/25/17    12:26 PM    [facility] [address] [place], [place], [address] Phone: [phone]   Fax:  [phone]  Name: MARCO RAPER MRN: [id number] Date of Birth: May 02, 1985

## 2017-08-26 ENCOUNTER — Ambulatory Visit: Payer: 59 | Admitting: Physical Therapy

## 2017-08-26 ENCOUNTER — Encounter: Payer: Self-pay | Admitting: Physical Therapy

## 2017-08-26 DIAGNOSIS — R29818 Other symptoms and signs involving the nervous system: Secondary | ICD-10-CM

## 2017-08-26 DIAGNOSIS — R208 Other disturbances of skin sensation: Secondary | ICD-10-CM

## 2017-08-26 DIAGNOSIS — R2689 Other abnormalities of gait and mobility: Secondary | ICD-10-CM

## 2017-08-26 DIAGNOSIS — G8222 Paraplegia, incomplete: Secondary | ICD-10-CM

## 2017-08-26 DIAGNOSIS — M6281 Muscle weakness (generalized): Secondary | ICD-10-CM | POA: Diagnosis not present

## 2017-08-26 DIAGNOSIS — R2681 Unsteadiness on feet: Secondary | ICD-10-CM

## 2017-08-26 NOTE — Therapy (Signed)
[facility] [address] [place], [place], [address] Phone: [phone]   Fax:  [phone]  Physical Therapy Treatment  Patient Details  Name: Scott Lane MRN: [id number] Date of Birth: 10-25-1985 Referring Provider: Maryla Morrow, MD   Encounter Date: 08/26/2017  PT End of Session - 08/26/17 1734    Visit Number  4    Number of Visits  17    Date for PT Re-Evaluation  10/13/17    Authorization Type  [facility] Family Deductible.  VL: MN    PT Start Time  0805    PT Stop Time  0850    PT Time Calculation (min)  45 min    Equipment Utilized During Treatment  Other (comment) Bioness    Activity Tolerance  Patient tolerated treatment well    Behavior During Therapy  WFL for tasks assessed/performed       Past Medical History:  Diagnosis Date  . Chicken pox     Past Surgical History:  Procedure Laterality Date  . LUMBAR LAMINECTOMY/DECOMPRESSION MICRODISCECTOMY N/A 08/01/2017   Procedure: Thoracic nine, ten, eleven, twelve laminectomy;  Surgeon: Tia Alert, MD;  Location: [facility] OR;  Service: Neurosurgery;  Laterality: N/A;  . none      There were no vitals filed for this visit.  Subjective Assessment - 08/26/17 0807    Subjective  LLE felt stronger on Saturday after the e-stim.  Has a sinus infection; started antiobiotics for it on Friday.    Patient is accompained by:  Family member    Pertinent History  None - very active    Limitations  Standing;Walking    How long can you stand comfortably?  8-10 minutes    How long can you walk comfortably?  uses cane and RW household distance, RW for community distances    Diagnostic tests  MRI     Patient Stated Goals  To be more independent at home and improve LE strength    Currently in Pain?  No/denies                       [facility] Adult PT Treatment/Exercise - 08/26/17 0808      Ambulation/Gait   Ambulation/Gait  Yes    Ambulation/Gait  Assistance  4: Min assist    Ambulation/Gait Assistance Details  assessment of gait with Bioness in gait mode with steering electrode on LLE; pt noted to have improved ankle DF sequencing with steering electrode on LLE.  In // bars performed pre-gait activities.  Returned to mat without the use of the RW but with patient's UE on therapist's shoulders for more upright trunk and to allow therapist to provide facilitation at trunk and pelvis for weight shifting and postural control    Ambulation Distance (Feet)  115 Feet    Assistive device  Rolling walker;None    Ambulation Surface  Level;Indoor    Pre-Gait Activities  in // bars with Bioness in gait mode performed forwards and retro gait with focus on hamstring activation in L stance for terminal hip extension to advance COG forwards with improved L knee control      Exercises   Exercises  Knee/Hip;Lumbar      Lumbar Exercises: Supine   Bent Knee Raise  10 reps    Bent Knee Raise Limitations  with Bioness in training mode; focus on maintaining neutral spine, isometric hip extension through one LE to stabilize, lifting contralateral LE to hip level, alternating  LE    Bridge  10 reps    Bridge Limitations  with Bioness in training mode to activate toe raise DF and hamstring activation      Modalities   Modalities  Primary school teacher Stimulation Location  bilat anterior tibialis, L hamstring mm    Electrical Stimulation Action  closed chain ankle DF, knee flexion, hip extension    Electrical Stimulation Parameters  see saved parameters, quick fit electrode R, steering electrode LLE    Electrical Stimulation Goals  Strength;Neuromuscular facilitation       Bridge Pose with Toe Lift    Press small of back into mat, maintain pelvic tilt, lift toes up and press through heels to lift hips slightly off the bed. Focus on engaging posterior hip muscles.  Do not arch low back Hold for __2__ breaths.  Repeat __10__ times.  Breathe out as you lift.    Hip Flexion - Supine    Lying on back, knees bent, feet on floor, bend hips, press through one leg, bring opposite knee up to hip level, return slowly.  Switch legs Repeat _8-10__ times.         PT Education - 08/26/17 1734    Education provided  Yes    Education Details  gait and strengthening with [employer]) Educated  Patient    Methods  Explanation;Demonstration;Handout    Comprehension  Verbalized understanding;Returned demonstration       PT Short Term Goals - 08/15/17 1730      PT SHORT TERM GOAL #1   Title  Pt will participate in further assessment of standing balance with BERG    Time  4    Period  Weeks    Status  Achieved      PT SHORT TERM GOAL #2   Title  Pt will participate in initial fitting and training of bilat Bioness    Time  4    Period  Weeks    Status  New    Target Date  09/13/17      PT SHORT TERM GOAL #3   Title  Pt will improve five time sit <> stand from arm chair to </= 15 seconds with more equal WB through bilat LE and improved control during stand > sit    Baseline  13 seconds but with L foot forwards, pushing up with UE and uncontrolled descent    Time  4    Period  Weeks    Status  New    Target Date  09/13/17      PT SHORT TERM GOAL #4   Title  Pt will improve gait velocity with RW to >/= 1.8 ft/sec    Baseline  1.6 ft/sec with RW    Time  4    Period  Weeks    Status  New    Target Date  09/13/17      PT SHORT TERM GOAL #5   Title  Pt will negotiate 12 stairs with 2 rails, alternating sequence with supervision    Time  4    Period  Weeks    Status  New    Target Date  09/13/17        PT Long Term Goals - 08/14/17 1232      PT LONG TERM GOAL #1   Title  Pt will be independent with balance and LE strengthening HEP    Time  8  Period  Weeks    Status  New    Target Date  10/13/17      PT LONG TERM GOAL #2   Title  Pt will improve standing balance and  BERG by 8 points to decrease falls risk    Baseline  TBD    Time  8    Period  Weeks    Status  New    Target Date  10/13/17      PT LONG TERM GOAL #3   Title  Pt will improve gait velocity to >/= 2.0 ft/sec with appropriate AFO and LRAD    Time  8    Period  Weeks    Status  New    Target Date  10/13/17      PT LONG TERM GOAL #4   Title  Pt will participate in AFO assessment if foot slap continues after 8 weeks of training with Bioness    Time  8    Period  Weeks    Status  New    Target Date  10/13/17      PT LONG TERM GOAL #5   Title  Pt will improve LE strength to perform five time sit <> stand without UE support in </= 15 seconds    Time  8    Period  Weeks    Status  New    Target Date  10/13/17      Additional Long Term Goals   Additional Long Term Goals  Yes      PT LONG TERM GOAL #6   Title  Pt will ambulate x 300' over indoor and outdoor paved surfaces with cane/appropriate AFO and supervision; will negotiate 12 stairs, 2 rails alternating sequence MOD I    Time  8    Period  Weeks    Status  New    Target Date  10/13/17            Plan - 08/26/17 1734    Clinical Impression Statement  Continued pre-gait and gait training with functional e-stim with pt demonstrating improved sequencing and postural control with decreased WB through UE.  Initiated LE strengthening with use of Bioness and provided pt with supine exercises to begin at home.  Will assess gait with Bioness and cane as well as tolerance to exercises at next visit.    Rehab Potential  Good    Clinical Impairments Affecting Rehab Potential  will have to have cervical laminectomy soon    PT Frequency  2x / week    PT Duration  8 weeks    PT Treatment/Interventions  ADLs/Self Care Home Management;Cryotherapy;Electrical Stimulation;Moist Heat;DME Instruction;Gait Network engineer;Therapeutic activities;Functional mobility training;Therapeutic exercise;Balance training;Neuromuscular  re-education;Patient/family education;Orthotic Fit/Training;Passive range of motion;Taping    PT Next Visit Plan   Bioness - quick fit R, steering L.  Trial gait with patient's cane, stair negotiation training.  How did he tolerate LE strength exercises given on 5/20.  **MAY NEED TO DOWNGRADE THE BRIDGE EXERCISE TO ISOMETRIC; MAY BE TOO EARLY -ONLY 4 WEEKS OUT*  Core strengthening.  LE strengthening HEP (sit to stand, hip flex, HS, DF), balance HEP based on BERG    PT Home Exercise Plan  stretching, core and bilat LE strengthening especially hip flexion, HS, DF    Consulted and Agree with Plan of Care  Patient;Family member/caregiver    Family Member Consulted  wife - "T"       Patient will benefit from skilled therapeutic intervention in order  to improve the following deficits and impairments:  Abnormal gait, Decreased balance, Decreased coordination, Decreased strength, Difficulty walking, Increased muscle spasms, Impaired sensation, Pain  Visit Diagnosis: Paraplegia, incomplete (HCC)  Other symptoms and signs involving the nervous system  Muscle weakness (generalized)  Other abnormalities of gait and mobility  Other disturbances of skin sensation  Unsteadiness     Problem List Patient Active Problem List   Diagnosis Date Noted  . Leukocytosis   . Neuropathic pain   . Drug induced constipation   . Urinary retention   . Class 1 obesity due to excess calories with serious comorbidity and body mass index (BMI) of 33.0 to 33.9 in adult   . Myelopathy (HCC) 08/01/2017  . Thoracic spinal stenosis 07/31/2017    Dierdre Highman, PT, DPT 08/26/17    5:41 PM    [facility] [address] [place], [place], [address] Phone: [phone]   Fax:  [phone]  Name: Scott Lane MRN: [id number] Date of Birth: 05-05-1985

## 2017-08-26 NOTE — Patient Instructions (Signed)
Bridge Pose with [employer] small of back into mat, maintain pelvic tilt, lift toes up and press through heels to lift hips slightly off the bed. Focus on engaging posterior hip muscles.  Do not arch low back Hold for __2__ breaths. Repeat __10__ times.  Breathe out as you lift.    Hip Flexion - Supine    Lying on back, knees bent, feet on floor, bend hips, press through one leg, bring opposite knee up to hip level, return slowly.  Switch legs Repeat _8-10__ times.

## 2017-08-27 ENCOUNTER — Inpatient Hospital Stay: Payer: 59 | Admitting: Family Medicine

## 2017-08-30 ENCOUNTER — Ambulatory Visit: Payer: 59 | Admitting: Rehabilitative and Restorative Service Providers"

## 2017-08-30 ENCOUNTER — Encounter: Payer: Self-pay | Admitting: Rehabilitative and Restorative Service Providers"

## 2017-08-30 DIAGNOSIS — R29818 Other symptoms and signs involving the nervous system: Secondary | ICD-10-CM

## 2017-08-30 DIAGNOSIS — R208 Other disturbances of skin sensation: Secondary | ICD-10-CM | POA: Diagnosis not present

## 2017-08-30 DIAGNOSIS — G8222 Paraplegia, incomplete: Secondary | ICD-10-CM | POA: Diagnosis not present

## 2017-08-30 DIAGNOSIS — M6281 Muscle weakness (generalized): Secondary | ICD-10-CM

## 2017-08-30 DIAGNOSIS — R2689 Other abnormalities of gait and mobility: Secondary | ICD-10-CM | POA: Diagnosis not present

## 2017-08-30 DIAGNOSIS — R2681 Unsteadiness on feet: Secondary | ICD-10-CM | POA: Diagnosis not present

## 2017-08-30 NOTE — Patient Instructions (Signed)
Hip Flexion - Supine    Lying on back, knees bent, feet on floor, bend hips, press through one leg, bring opposite knee up to hip level, return slowly.  Switch legs Repeat _8-10__ times.    Supine Hamstring Stretch    Keep other leg bent, strap belt around foot and lift leg until tension is felt.  Keep knee slightly bent.  Hold 30 seconds.  Repeat with other leg. Repeat __2__ times each leg. Do __2__ sessions per day.  Piriformis Stretch, Supine    Lie supine, one ankle crossed onto opposite knee. Keep pelvis stabilized.  Hold __30_ seconds. For deeper stretch gently push top knee away from body.  Repeat __2_ times per leg. Do _2__ sessions per day.  Butterfly, Supine    Lie on back, feet together. Lower knees toward floor. Hold _30__ seconds. Repeat _2__ times per session. Do _2__ sessions per day.  Place pillows under knees for support if needed.  Copyright  VHI. All rights reserved.     KNEE: Flexion / Extension - Sitting    Sit at edge of surface, foot on towel or pillowcase. Bend and straighten knee. _10-15__ reps per set, _2__ sets per day, __7_ days per week.  Copyright  VHI. All rights reserved.

## 2017-08-30 NOTE — Therapy (Signed)
[facility] [address] [place], [place], [address] Phone: [phone]   Fax:  [phone]  Physical Therapy Treatment  Patient Details  Name: Scott Lane MRN: [id number] Date of Birth: 1985/06/17 Referring Provider: Maryla Morrow, MD   Encounter Date: 08/30/2017  PT End of Session - 08/30/17 1041    Visit Number  5    Number of Visits  17    Date for PT Re-Evaluation  10/13/17    Authorization Type   Family Deductible.  VL: MN    PT Start Time  0851    PT Stop Time  0937    PT Time Calculation (min)  46 min    Equipment Utilized During Treatment  Other (comment) Bioness    Activity Tolerance  Patient tolerated treatment well    Behavior During Therapy  WFL for tasks assessed/performed       Past Medical History:  Diagnosis Date  . Chicken pox     Past Surgical History:  Procedure Laterality Date  . LUMBAR LAMINECTOMY/DECOMPRESSION MICRODISCECTOMY N/A 08/01/2017   Procedure: Thoracic nine, ten, eleven, twelve laminectomy;  Surgeon: Tia Alert, MD;  Location: [facility] OR;  Service: Neurosurgery;  Laterality: N/A;  . none      There were no vitals filed for this visit.  Subjective Assessment - 08/30/17 1035    Subjective  The patient notes that he is getting stronger.  PT and patient discussed d/c of bridge exercise (he notes no soreness) due to wanting to wait until 6-8 weeks post surgery.      Patient Stated Goals  To be more independent at home and improve LE strength    Currently in Pain?  No/denies                       [facility] Adult PT Treatment/Exercise - 08/30/17 1042      Ambulation/Gait   Ambulation/Gait  Yes    Ambulation/Gait Assistance  4: Min guard;4: Min assist    Ambulation/Gait Assistance Details  Gait activities performed with Bioness use bilateral ankle dorsiflexors and left hamstring    Ambulation Distance (Feet)  145 Feet x 2 reps    Assistive device  Rolling  walker;R Forearm Crutch    Gait Pattern  Step-through pattern;Decreased hip/knee flexion - left;Decreased dorsiflexion - left;Left genu recurvatum;Decreased trunk rotation;Poor foot clearance - left    Ambulation Surface  Level;Indoor    Gait Comments  *Gait activiites with RW 100 ft into clinic with patient performing initially a step to pattern with large left step, stopping the walker, then stepping to with the right leg.  PT provided tactile cues and verbal cues for equal stride/step length and continuing to keep the walker rolling.  Also provided tactile cues for L knee flexion to reduce genu recurvatum.  With bioness donned, performed gait with RW x 145 feet (up to front and back to gym area), then with one forearm crutch with min A.  PT provided cues and with repetition, the patient demonstrated a smoother gait pattern and more equal step length.  He fatigued with increased L knee recurvatum after 100 ft.        Self-Care   Self-Care  Other Self-Care Comments    Other Self-Care Comments   PT and the patient discussed "how long will it take for my walking to be normal?"  When further described, this means to the patient no device and people not being able to  tell there is a neurologic impairment.  PT discussed overall 6-12 months to know long term outcomes after neurologic injury.  Discussed good prognostic indicators including age, health status, fitness level, motivation.  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  We discussed need to be on his feet for hours and back up/move quickly on sidelines.  *PT recommended we discuss as we go- as we should know more in next 1- 2 months.  Also wanted primary PTs input as this was my first day working with the patient.      Exercises   Exercises  Knee/Hip      Knee/Hip Exercises: Standing   Wall Squat  5 reps    Wall Squat Limitations  *performed with bioness performing squat when e-stim  on hamstring and anterior tibialis fires 6 seconds on/6 off cycles for training.        Knee/Hip Exercises: Seated   Heel Slides  Left;10 reps;Strengthening    Heel Slides Limitations  *Performed with functional e-stim to isolate L hamstrings x 8 second holds with e-stim + 8 second rest.        Modalities   Modalities  Electrical Stimulation      Electrical Stimulation   Electrical Stimulation Location  bilat anterior tibialis, L hamstring mm    Electrical Stimulation Action  ankle DF bilatrally during gait and hamstring to improve knee flexion    Electrical Stimulation Parameters  saved parameters in bioness    Electrical Stimulation Goals  Strength;Neuromuscular facilitation             PT Education - 08/30/17 1036    Education provided  Yes    Education Details  PT removed bridge from current HEP (wait until 6-8 weeks post), added seated knee flexion for isolated hamstring contraction    Person(s) Educated  Patient    Methods  Explanation;Demonstration;Handout    Comprehension  Verbalized understanding;Returned demonstration       PT Short Term Goals - 08/15/17 1730      PT SHORT TERM GOAL #1   Title  Pt will participate in further assessment of standing balance with BERG    Time  4    Period  Weeks    Status  Achieved      PT SHORT TERM GOAL #2   Title  Pt will participate in initial fitting and training of bilat Bioness    Time  4    Period  Weeks    Status  New    Target Date  09/13/17      PT SHORT TERM GOAL #3   Title  Pt will improve five time sit <> stand from arm chair to </= 15 seconds with more equal WB through bilat LE and improved control during stand > sit    Baseline  13 seconds but with L foot forwards, pushing up with UE and uncontrolled descent    Time  4    Period  Weeks    Status  New    Target Date  09/13/17      PT SHORT TERM GOAL #4   Title  Pt will improve gait velocity with RW to >/= 1.8 ft/sec    Baseline  1.6 ft/sec with RW     Time  4    Period  Weeks    Status  New    Target Date  09/13/17      PT SHORT TERM  GOAL #5   Title  Pt will negotiate 12 stairs with 2 rails, alternating sequence with supervision    Time  4    Period  Weeks    Status  New    Target Date  09/13/17        PT Long Term Goals - 08/14/17 1232      PT LONG TERM GOAL #1   Title  Pt will be independent with balance and LE strengthening HEP    Time  8    Period  Weeks    Status  New    Target Date  10/13/17      PT LONG TERM GOAL #2   Title  Pt will improve standing balance and BERG by 8 points to decrease falls risk    Baseline  TBD    Time  8    Period  Weeks    Status  New    Target Date  10/13/17      PT LONG TERM GOAL #3   Title  Pt will improve gait velocity to >/= 2.0 ft/sec with appropriate AFO and LRAD    Time  8    Period  Weeks    Status  New    Target Date  10/13/17      PT LONG TERM GOAL #4   Title  Pt will participate in AFO assessment if foot slap continues after 8 weeks of training with Bioness    Time  8    Period  Weeks    Status  New    Target Date  10/13/17      PT LONG TERM GOAL #5   Title  Pt will improve LE strength to perform five time sit <> stand without UE support in </= 15 seconds    Time  8    Period  Weeks    Status  New    Target Date  10/13/17      Additional Long Term Goals   Additional Long Term Goals  Yes      PT LONG TERM GOAL #6   Title  Pt will ambulate x 300' over indoor and outdoor paved surfaces with cane/appropriate AFO and supervision; will negotiate 12 stairs, 2 rails alternating sequence MOD I    Time  8    Period  Weeks    Status  New    Target Date  10/13/17            Plan - 08/30/17 1057    Clinical Impression Statement  The patient showed significant improvement in reciprocal nature of gait activities throughout session.  He can isolate hamstrings in seated for ther ex and PT added for HEP as knee control hinders gait activities.  PT to continue working  towards LTGs increasing motor control in LEs, emphasizing improved reciprocal nature of gait and progressing to longer distance ambulation.  Patient is motivated to participate.     PT Treatment/Interventions  ADLs/Self Care Home Management;Cryotherapy;Electrical Stimulation;Moist Heat;DME Instruction;Gait Network engineer;Therapeutic activities;Functional mobility training;Therapeutic exercise;Balance training;Neuromuscular re-education;Patient/family education;Orthotic Fit/Training;Passive range of motion;Taping    PT Next Visit Plan  Bioness, *patient forgot cane today- use cane or R forearm crutch.  Core strengthening, LE strengthening, balance activities.  Gait with motor control.     Consulted and Agree with Plan of Care  Patient       Patient will benefit from skilled therapeutic intervention in order to improve the following deficits and impairments:  Abnormal gait, Decreased balance, Decreased  coordination, Decreased strength, Difficulty walking, Increased muscle spasms, Impaired sensation, Pain  Visit Diagnosis: Paraplegia, incomplete (HCC)  Other symptoms and signs involving the nervous system  Muscle weakness (generalized)  Other abnormalities of gait and mobility     Problem List Patient Active Problem List   Diagnosis Date Noted  . Leukocytosis   . Neuropathic pain   . Drug induced constipation   . Urinary retention   . Class 1 obesity due to excess calories with serious comorbidity and body mass index (BMI) of 33.0 to 33.9 in adult   . Myelopathy (HCC) 08/01/2017  . Thoracic spinal stenosis 07/31/2017    Benjerman Molinelli, PT 08/30/2017, 10:59 AM  [facility] [address] [place], [place], [address] Phone: [phone]   Fax:  [phone]  Name: ARVLE GRABE MRN: [id number] Date of Birth: Apr 17, 1985

## 2017-09-05 ENCOUNTER — Encounter: Payer: Self-pay | Admitting: Physical Therapy

## 2017-09-05 ENCOUNTER — Ambulatory Visit: Payer: 59 | Admitting: Physical Therapy

## 2017-09-05 DIAGNOSIS — G8222 Paraplegia, incomplete: Secondary | ICD-10-CM

## 2017-09-05 DIAGNOSIS — R29818 Other symptoms and signs involving the nervous system: Secondary | ICD-10-CM | POA: Diagnosis not present

## 2017-09-05 DIAGNOSIS — M6281 Muscle weakness (generalized): Secondary | ICD-10-CM

## 2017-09-05 DIAGNOSIS — R2681 Unsteadiness on feet: Secondary | ICD-10-CM | POA: Diagnosis not present

## 2017-09-05 DIAGNOSIS — R208 Other disturbances of skin sensation: Secondary | ICD-10-CM

## 2017-09-05 DIAGNOSIS — R2689 Other abnormalities of gait and mobility: Secondary | ICD-10-CM | POA: Diagnosis not present

## 2017-09-05 NOTE — Therapy (Signed)
[facility] [address] [place], [place], [address] Phone: [phone]   Fax:  [phone]  Physical Therapy Treatment  Patient Details  Name: Scott Lane MRN: [id number] Date of Birth: 04/18/1985 Referring Provider: Maryla Morrow, MD   Encounter Date: 09/05/2017  PT End of Session - 09/05/17 1736    Visit Number  6    Number of Visits  17    Date for PT Re-Evaluation  10/13/17    Authorization Type   Family Deductible.  VL: [place]    PT Start Time  1538    PT Stop Time  1630    PT Time Calculation (min)  52 min    Equipment Utilized During Treatment  Other (comment) Bioness    Activity Tolerance  Patient tolerated treatment well    Behavior During Therapy  WFL for tasks assessed/performed       Past Medical History:  Diagnosis Date  . Chicken pox     Past Surgical History:  Procedure Laterality Date  . LUMBAR LAMINECTOMY/DECOMPRESSION MICRODISCECTOMY N/A 08/01/2017   Procedure: Thoracic nine, ten, eleven, twelve laminectomy;  Surgeon: Tia Alert, MD;  Location: [facility] OR;  Service: Neurosurgery;  Laterality: N/A;  . none      There were no vitals filed for this visit.  Subjective Assessment - 09/05/17 1543    Subjective  Brought cane today; has been walking with it the past 3 days.  Pt having concerns about return of sexual function after this type of injury.    Limitations  Standing;Walking    How long can you stand comfortably?  8-10 minutes    How long can you walk comfortably?  uses cane and RW household distance, RW for community distances    Diagnostic tests  MRI     Patient Stated Goals  To be more independent at home and improve LE strength    Currently in Pain?  Yes    Pain Score  2     Pain Location  Knee    Pain Orientation  Left    Pain Descriptors / Indicators  Tightness    Pain Type  Acute pain                       [facility] Adult PT Treatment/Exercise - 09/05/17  1654      Ambulation/Gait   Ambulation/Gait  Yes    Ambulation/Gait Assistance  4: Min guard;4: Min assist    Ambulation/Gait Assistance Details  with Bioness in gait mode and use of cane; cued pt for increased weight shift and stance time on LLE, increased step length RLE.      Ambulation Distance (Feet)  230 Feet    Assistive device  Straight cane    Gait Pattern  Step-through pattern;Decreased step length - right;Decreased stance time - left;Decreased stride length;Decreased hip/knee flexion - left;Decreased dorsiflexion - left;Decreased weight shift to left;Left genu recurvatum    Ambulation Surface  Level;Indoor    Ramp  3: Mod assist    Ramp Details (indicate cue type and reason)  with cane and Bioness, pt demonstrated increased L genu recurvatum when ascending and decreased step length when ascending and descending    Curb  3: Mod assist    Curb Details (indicate cue type and reason)  with cane and Bioness; therapist provided cues for safe sequencing with cane, support for increased trunk control when ascending and descending and to help pt regain balance once  when L heel caught on curb when descending    Pre-Gait Activities  in // bars with Bioness on gait mode performed forwards and retro gait x 4 reps, lateral stepping to L and R x 2 reps and tandem gait x 2 reps with focus on increased activation of hamstring on L for improved knee control and hip extension in stance as well as closed chain ankle DF.  During lateral stepping focused on full lateral weight shifting and trunk control      Self-Care   Self-Care  Other Self-Care Comments    Other Self-Care Comments   Discussed with pt various physiological and psychological causes of sexual dysfunction after spinal cord injury and provided pt with hand out from national spinal cord association regarding sexuality after spinal cord injury and a list of other resources.  Encouraged pt to discuss with physician and listed other medical  professionals that pt may be able to provide more thorough information, education and suggestions (RN, neuropsych) to enhance sexual function and ease his anxiety (pt states he stays awake at night worrying about it).        Modalities   Modalities  Primary school teacher Stimulation Location  bilat anterior tibialis, L hamstring mm    Electrical Stimulation Action  open and closed chain ankle DF bilaterally, L hamstring for knee flexion and hip extension    Electrical Stimulation Parameters  saved parameters in tablet 2, quick fit R electrode, steering L electrode    Electrical Stimulation Goals  Strength;Neuromuscular facilitation             PT Education - 09/05/17 1735    Education provided  Yes    Education Details  basic physiological causes for sexual dysfunction after incomplete SCI and possible referral sources for ongoing education    Person(s) Educated  Patient    Methods  Explanation    Comprehension  Verbalized understanding       PT Short Term Goals - 08/15/17 1730      PT SHORT TERM GOAL #1   Title  Pt will participate in further assessment of standing balance with BERG    Time  4    Period  Weeks    Status  Achieved      PT SHORT TERM GOAL #2   Title  Pt will participate in initial fitting and training of bilat Bioness    Time  4    Period  Weeks    Status  New    Target Date  09/13/17      PT SHORT TERM GOAL #3   Title  Pt will improve five time sit <> stand from arm chair to </= 15 seconds with more equal WB through bilat LE and improved control during stand > sit    Baseline  13 seconds but with L foot forwards, pushing up with UE and uncontrolled descent    Time  4    Period  Weeks    Status  New    Target Date  09/13/17      PT SHORT TERM GOAL #4   Title  Pt will improve gait velocity with RW to >/= 1.8 ft/sec    Baseline  1.6 ft/sec with RW    Time  4    Period  Weeks    Status  New    Target Date   09/13/17      PT SHORT TERM GOAL #5  Title  Pt will negotiate 12 stairs with 2 rails, alternating sequence with supervision    Time  4    Period  Weeks    Status  New    Target Date  09/13/17        PT Long Term Goals - 08/14/17 1232      PT LONG TERM GOAL #1   Title  Pt will be independent with balance and LE strengthening HEP    Time  8    Period  Weeks    Status  New    Target Date  10/13/17      PT LONG TERM GOAL #2   Title  Pt will improve standing balance and BERG by 8 points to decrease falls risk    Baseline  TBD    Time  8    Period  Weeks    Status  New    Target Date  10/13/17      PT LONG TERM GOAL #3   Title  Pt will improve gait velocity to >/= 2.0 ft/sec with appropriate AFO and LRAD    Time  8    Period  Weeks    Status  New    Target Date  10/13/17      PT LONG TERM GOAL #4   Title  Pt will participate in AFO assessment if foot slap continues after 8 weeks of training with Bioness    Time  8    Period  Weeks    Status  New    Target Date  10/13/17      PT LONG TERM GOAL #5   Title  Pt will improve LE strength to perform five time sit <> stand without UE support in </= 15 seconds    Time  8    Period  Weeks    Status  New    Target Date  10/13/17      Additional Long Term Goals   Additional Long Term Goals  Yes      PT LONG TERM GOAL #6   Title  Pt will ambulate x 300' over indoor and outdoor paved surfaces with cane/appropriate AFO and supervision; will negotiate 12 stairs, 2 rails alternating sequence MOD I    Time  8    Period  Weeks    Status  New    Target Date  10/13/17            Plan - 09/05/17 1737    Clinical Impression Statement  Treatment session continued to focus on use of Bioness for NMR/strengthening and motor planning/sequencing of gait sequence during gait with cane in various directions and over variety of surfaces.  Pt continues to demonstrate on L decreased activation of hamstring and closed chain ankle DF in  stance phase.  Will continue to address and will continue to address patient's questions regarding sequelae of incomplete spinal cord injury and will make referrals as necessary.    PT Treatment/Interventions  ADLs/Self Care Home Management;Cryotherapy;Electrical Stimulation;Moist Heat;DME Instruction;Gait Network engineer;Therapeutic activities;Functional mobility training;Therapeutic exercise;Balance training;Neuromuscular re-education;Patient/family education;Orthotic Fit/Training;Passive range of motion;Taping    PT Next Visit Plan  gait with cane and Bioness - focus on variety of obstacles, surfaces - ramp/curb/stairs/grass.  Standing balance.  Core and back strengthening when cleared by surgeon.  Pt asking questions about sexual function, would he be open to discussing with RN, neuropsych, physiatrist?    Consulted and Agree with Plan of Care  Patient  Patient will benefit from skilled therapeutic intervention in order to improve the following deficits and impairments:  Abnormal gait, Decreased balance, Decreased coordination, Decreased strength, Difficulty walking, Increased muscle spasms, Impaired sensation, Pain  Visit Diagnosis: Paraplegia, incomplete (HCC)  Other symptoms and signs involving the nervous system  Muscle weakness (generalized)  Other abnormalities of gait and mobility  Other disturbances of skin sensation  Unsteadiness     Problem List Patient Active Problem List   Diagnosis Date Noted  . Leukocytosis   . Neuropathic pain   . Drug induced constipation   . Urinary retention   . Class 1 obesity due to excess calories with serious comorbidity and body mass index (BMI) of 33.0 to 33.9 in adult   . Myelopathy (HCC) 08/01/2017  . Thoracic spinal stenosis 07/31/2017    Dierdre Highman, PT, DPT 09/05/17    5:52 PM     [facility] [address] [place], [place], [address] Phone:  [phone]   Fax:  [phone]  Name: Scott Lane MRN: [id number] Date of Birth: July 27, 1985

## 2017-09-06 ENCOUNTER — Encounter: Payer: Self-pay | Admitting: Rehabilitative and Restorative Service Providers"

## 2017-09-06 ENCOUNTER — Ambulatory Visit: Payer: 59 | Admitting: Rehabilitative and Restorative Service Providers"

## 2017-09-06 DIAGNOSIS — R29818 Other symptoms and signs involving the nervous system: Secondary | ICD-10-CM

## 2017-09-06 DIAGNOSIS — R2689 Other abnormalities of gait and mobility: Secondary | ICD-10-CM

## 2017-09-06 DIAGNOSIS — G8222 Paraplegia, incomplete: Secondary | ICD-10-CM | POA: Diagnosis not present

## 2017-09-06 DIAGNOSIS — M6281 Muscle weakness (generalized): Secondary | ICD-10-CM

## 2017-09-06 DIAGNOSIS — R2681 Unsteadiness on feet: Secondary | ICD-10-CM | POA: Diagnosis not present

## 2017-09-06 DIAGNOSIS — R208 Other disturbances of skin sensation: Secondary | ICD-10-CM | POA: Diagnosis not present

## 2017-09-06 NOTE — Therapy (Signed)
[facility] [address] [place], [place], [address] Phone: [phone]   Fax:  [phone]  Physical Therapy Treatment  Patient Details  Name: NYAIR DEPAULO MRN: [id number] Date of Birth: Jun 25, 1985 Referring Provider: Maryla Morrow, MD   Encounter Date: 09/06/2017  PT End of Session - 09/06/17 1612    Visit Number  7    Number of Visits  17    Date for PT Re-Evaluation  10/13/17    Authorization Type  Why Family Deductible.  VL: MN    PT Start Time  1445    PT Stop Time  1535    PT Time Calculation (min)  50 min    Equipment Utilized During Treatment  Other (comment) Bioness    Activity Tolerance  Patient tolerated treatment well    Behavior During Therapy  WFL for tasks assessed/performed       Past Medical History:  Diagnosis Date  . Chicken pox     Past Surgical History:  Procedure Laterality Date  . LUMBAR LAMINECTOMY/DECOMPRESSION MICRODISCECTOMY N/A 08/01/2017   Procedure: Thoracic nine, ten, eleven, twelve laminectomy;  Surgeon: Tia Alert, MD;  Location: [facility] OR;  Service: Neurosurgery;  Laterality: N/A;  . none      There were no vitals filed for this visit.  Subjective Assessment - 09/06/17 1602    Subjective  The patient walked in with [facility] noting he is not going back to the walker.    Patient is accompained by:  Family member wife    Pertinent History  None - very active    Patient Stated Goals  To be more independent at home and improve LE strength    Currently in Pain?  Yes    Pain Score  -- "notes tightness in knees"    Pain Location  Knee    Pain Orientation  Right;Left    Pain Descriptors / Indicators  Tightness    Pain Type  Acute pain    Pain Onset  More than a month ago    Pain Frequency  Intermittent    Aggravating Factors   sitting for longer periods    Pain Relieving Factors  stretching                       [facility] Adult PT Treatment/Exercise -  09/06/17 1604      Ambulation/Gait   Ambulation/Gait  Yes    Ambulation/Gait Assistance  4: Min guard;5: Supervision    Ambulation/Gait Assistance Details  With bioness in gait mode and use of SPC;  PT provided tactile and verbal cues for L tibia anterior translation over ankle, L hip initiation, and increased L stance time to  lengthen R stride length.    Ambulation Distance (Feet)  230 Feet 115, 10 feet x 4 reps    Assistive device  Straight cane    Gait Pattern  Step-through pattern;Decreased step length - right;Decreased stance time - left;Decreased stride length;Decreased hip/knee flexion - left;Decreased dorsiflexion - left;Decreased weight shift to left;Left genu recurvatum    Ambulation Surface  Level;Indoor    Gait Comments  Along countertop performed walking with cues left knee control, hip resistance at anterior hips for initiation/anterior translation of hips      Neuro Re-ed    Neuro Re-ed Details   Sidestepping x 10 feet x right and left sides with tactile cues L knee      Exercises   Exercises  Knee/Hip  Knee/Hip Exercises: Stretches   Gastroc Stretch  Right;Left;1 rep;30 seconds    Gastroc Stretch Limitations  SEATED with bioness: performed L heel cord stretch pressing through thigh with L heel off 4" step.  Standing heel cord stretch at countertop with tactile cues on positioning    Soleus Stretch  Right;Left;1 rep;30 seconds    Soleus Stretch Limitations  With tactile cues to maintain foot position while flexing at knee      Knee/Hip Exercises: Standing   Heel Raises  10 reps;Both;1 set    Heel Raises Limitations  Performed heel raise/ alternating with heel cord stretch off 2" block while bioness stimulated anterior tib (during heel cord stretch/ dropping heels off back of 2" step).  Did cycle of 4 seconds on/ 4 seconds off    Hip Flexion  Stengthening;Right;Left;10 reps    Hip Flexion Limitations  At countertop with Bioness in gait mode      Modalities    Modalities  Electrical Stimulation      Electrical Stimulation   Electrical Stimulation Location  bilat anterior tibialis, L hamstring mm    Electrical Stimulation Action  Open and closed chain left hamstring, bilat anterior tibialis/dorsiflexion.    Electrical Stimulation Parameters  saved parameters in tablet 2    Electrical Stimulation Goals  Strength;Neuromuscular facilitation             PT Education - 09/06/17 1610    Education provided  Yes    Education Details  Patient inquires about recovery time noting he is not very patient.  PT discussed that recovery from back surgery without SCI impairment can take months.  We discussed the sensory components, bladder and sexual dysfunction concerns may take months to know what lasting impairments are present.  Patient notes he has been googling articles and is concerned.  PT emphasized indivudual outcomes and taking time to focus on what he can do to improve recovery.  Also discussed that he is doing everything he currently can by participating in exercises, being dedicated to work hard in therapy, etc.     Person(s) Educated  Patient;Spouse    Methods  Explanation    Comprehension  Verbalized understanding       PT Short Term Goals - 08/15/17 1730      PT SHORT TERM GOAL #1   Title  Pt will participate in further assessment of standing balance with BERG    Time  4    Period  Weeks    Status  Achieved      PT SHORT TERM GOAL #2   Title  Pt will participate in initial fitting and training of bilat Bioness    Time  4    Period  Weeks    Status  New    Target Date  09/13/17      PT SHORT TERM GOAL #3   Title  Pt will improve five time sit <> stand from arm chair to </= 15 seconds with more equal WB through bilat LE and improved control during stand > sit    Baseline  13 seconds but with L foot forwards, pushing up with UE and uncontrolled descent    Time  4    Period  Weeks    Status  New    Target Date  09/13/17      PT  SHORT TERM GOAL #4   Title  Pt will improve gait velocity with RW to >/= 1.8 ft/sec    Baseline  1.6 ft/sec  with RW    Time  4    Period  Weeks    Status  New    Target Date  09/13/17      PT SHORT TERM GOAL #5   Title  Pt will negotiate 12 stairs with 2 rails, alternating sequence with supervision    Time  4    Period  Weeks    Status  New    Target Date  09/13/17        PT Long Term Goals - 08/14/17 1232      PT LONG TERM GOAL #1   Title  Pt will be independent with balance and LE strengthening HEP    Time  8    Period  Weeks    Status  New    Target Date  10/13/17      PT LONG TERM GOAL #2   Title  Pt will improve standing balance and BERG by 8 points to decrease falls risk    Baseline  TBD    Time  8    Period  Weeks    Status  New    Target Date  10/13/17      PT LONG TERM GOAL #3   Title  Pt will improve gait velocity to >/= 2.0 ft/sec with appropriate AFO and LRAD    Time  8    Period  Weeks    Status  New    Target Date  10/13/17      PT LONG TERM GOAL #4   Title  Pt will participate in AFO assessment if foot slap continues after 8 weeks of training with Bioness    Time  8    Period  Weeks    Status  New    Target Date  10/13/17      PT LONG TERM GOAL #5   Title  Pt will improve LE strength to perform five time sit <> stand without UE support in </= 15 seconds    Time  8    Period  Weeks    Status  New    Target Date  10/13/17      Additional Long Term Goals   Additional Long Term Goals  Yes      PT LONG TERM GOAL #6   Title  Pt will ambulate x 300' over indoor and outdoor paved surfaces with cane/appropriate AFO and supervision; will negotiate 12 stairs, 2 rails alternating sequence MOD I    Time  8    Period  Weeks    Status  New    Target Date  10/13/17            Plan - 09/06/17 1613    Clinical Impression Statement  The patient continues to inquire about recovery times and PT continuing to emphasize giving his body 6+ months to  guage recovery.  PT is continuing to emphasize L knee control, motor learning during gait activities, gait mechanics for improved functional mobility.     PT Treatment/Interventions  ADLs/Self Care Home Management;Cryotherapy;Electrical Stimulation;Moist Heat;DME Instruction;Gait Network engineer;Therapeutic activities;Functional mobility training;Therapeutic exercise;Balance training;Neuromuscular re-education;Patient/family education;Orthotic Fit/Training;Passive range of motion;Taping    PT Next Visit Plan  Bioness with giat, BEGIN CHECKING STGS, ramp/curb/grass/stairs; core/back activities once cleared by surgeon.  Continue to address patient concerns re: recovery, sexual function, etc.    Consulted and Agree with Plan of Care  Patient;Family member/caregiver    Family Member Consulted  wife - "T"  Patient will benefit from skilled therapeutic intervention in order to improve the following deficits and impairments:  Abnormal gait, Decreased balance, Decreased coordination, Decreased strength, Difficulty walking, Increased muscle spasms, Impaired sensation, Pain  Visit Diagnosis: Other symptoms and signs involving the nervous system  Muscle weakness (generalized)  Other abnormalities of gait and mobility  Unsteadiness     Problem List Patient Active Problem List   Diagnosis Date Noted  . Leukocytosis   . Neuropathic pain   . Drug induced constipation   . Urinary retention   . Class 1 obesity due to excess calories with serious comorbidity and body mass index (BMI) of 33.0 to 33.9 in adult   . Myelopathy (HCC) 08/01/2017  . Thoracic spinal stenosis 07/31/2017    Emaleigh Guimond ,PT 09/06/2017, 4:17 PM  [facility] [address] [place], [place], [address] Phone: [phone]   Fax:  [phone]  Name: HOSEA HANAWALT MRN: [id number] Date of Birth: 26-Jan-1986

## 2017-09-11 ENCOUNTER — Ambulatory Visit: Payer: 59 | Attending: Physical Medicine & Rehabilitation | Admitting: Physical Therapy

## 2017-09-11 ENCOUNTER — Encounter: Payer: Self-pay | Admitting: Physical Therapy

## 2017-09-11 DIAGNOSIS — R2681 Unsteadiness on feet: Secondary | ICD-10-CM | POA: Diagnosis not present

## 2017-09-11 DIAGNOSIS — M6281 Muscle weakness (generalized): Secondary | ICD-10-CM | POA: Diagnosis not present

## 2017-09-11 DIAGNOSIS — G8222 Paraplegia, incomplete: Secondary | ICD-10-CM | POA: Insufficient documentation

## 2017-09-11 DIAGNOSIS — R29818 Other symptoms and signs involving the nervous system: Secondary | ICD-10-CM | POA: Insufficient documentation

## 2017-09-11 DIAGNOSIS — R2689 Other abnormalities of gait and mobility: Secondary | ICD-10-CM | POA: Insufficient documentation

## 2017-09-11 DIAGNOSIS — R208 Other disturbances of skin sensation: Secondary | ICD-10-CM | POA: Insufficient documentation

## 2017-09-11 NOTE — Therapy (Signed)
[facility] [address] [place], [place], [address] Phone: [phone]   Fax:  [phone]  Physical Therapy Treatment  Patient Details  Name: Scott Lane MRN: [id number] Date of Birth: June 07, 1985 Referring Provider: Maryla MorrowAnkit Patel, MD   Encounter Date: 09/11/2017  PT End of Session - 09/11/17 0932    Visit Number  8    Number of Visits  17    Date for PT Re-Evaluation  10/13/17    Authorization Type  [place] [employer] Deductible.  VL: MN    PT Start Time  0803    PT Stop Time  0850    PT Time Calculation (min)  47 min    Equipment Utilized During Treatment  Other (comment) Bioness    Activity Tolerance  Patient tolerated treatment well    Behavior During Therapy  WFL for tasks assessed/performed       Past Medical History:  Diagnosis Date  . Chicken pox     Past Surgical History:  Procedure Laterality Date  . LUMBAR LAMINECTOMY/DECOMPRESSION MICRODISCECTOMY N/A 08/01/2017   Procedure: Thoracic nine, ten, eleven, twelve laminectomy;  Surgeon: Tia AlertJones, David S, MD;  [facility];  Service: Neurosurgery;  Laterality: N/A;  . none      There were no vitals filed for this visit.  Subjective Assessment - 09/11/17 0918    Subjective  Since Friday pt has had increased stiffness in L knee, had to use the RW yesterday.      Patient is accompained by:  Family member wife    Pertinent History  None - very active    Patient Stated Goals  To be more independent at home and improve LE strength    Currently in Pain?  No/denies    Pain Onset  More than a month ago         [facility] PT Assessment - 09/11/17 0814      Strength   Overall Strength  Deficits    Overall Strength Comments  RLE: has increased to 3+/5 hip flexion, 5/5 knee extension, ankle DF, 4/5 knee flexion.  LLE: 3+/5 hip flexion, 4/5 knee extension, 2/5 ankle DF with decreased AROM, 3+/5 knee flexion      RLE Tone   RLE Tone  Other (comment);Modified  Ashworth      RLE Tone   Modified Ashworth Scale for Grading Hypertonia RLE  Slight increase in muscle tone, manifested by a catch and release or by minimal resistance at the end of the range of motion when the affected part(s) is moved in flexion or extension      LLE Tone   LLE Tone  Mild;Modified Ashworth      LLE Tone   Modified Ashworth Scale for Grading Hypertonia LLE  More marked increase in muscle tone through most of the ROM, but affected part(s) easily moved                   [facility] Adult PT Treatment/Exercise - 09/11/17 0920      Ambulation/Gait   Ambulation/Gait  Yes    Ambulation/Gait Assistance  4: Min guard    Ambulation/Gait Assistance Details  With Bioness in gait mode with focus on full anterior/lateral weight shift to LLE in stance and forward translation of tibia over L stance foot.  Decreased step length initially due to stiffness, as pt ambulated further pt demonstrated increased step and stride length as LE loosened up    Ambulation Distance (Feet)  115 Feet  Assistive device  Straight cane    Gait Pattern  Step-through pattern;Decreased step length - right;Decreased stride length;Decreased weight shift to left;Left genu recurvatum;Step-to pattern;Decreased step length - left    Ambulation Surface  Level;Indoor    Stairs  Yes    Stairs Assistance  4: Min assist    Stairs Assistance Details (indicate cue type and reason)  forwards and retro x 3 sets each with Bioness in gait mode to focus on timing of hamstring and anterior tibialis activation when stepping forwards and when negotiating stairs; during step ups paused to focus on single limb stance control and stability    Stair Management Technique  Two rails;Alternating pattern;Backwards;Forwards    Number of Stairs  12 2 sets    Height of Stairs  6      Knee/Hip Exercises: Stretches   Gastroc Stretch  Right;Left;5 reps;10 seconds    Gastroc Stretch Limitations  standing with one foot on 6" step  leaning forwards, manual facilitation to maintain heel position on step and maintain neutral rotation of L hip (tends to adduct)    Soleus Stretch  Right;Left;5 reps;10 seconds    Soleus Stretch Limitations  standing with one foot on 6" step leaning forwards, manual facilitation to maintain heel position on step and maintain neutral rotation of L hip (tends to adduct)      Modalities   Modalities  Electrical Stimulation      Electrical Stimulation   Electrical Stimulation Location  bilat anterior tibialis, L hamstring mm    Electrical Stimulation Action  Open and closed chain left hamstring, bilat anterior tibialis/dorsiflexion.    Electrical Stimulation Parameters  saved parameters in tablet 2, quick fit R electrode, steering L electrode    Electrical Stimulation Goals  Strength;Neuromuscular facilitation          Balance Exercises - 09/11/17 0923      Balance Exercises: Standing   SLS  Eyes open;Upper extremity support 1;Upper extremity support 2;5 reps;10 secs standing on rockerboard with Bioness training mode    Rockerboard  Anterior/posterior;EO;10 seconds;10 reps;Intermittent UE support with Bioness in training mode        PT Education - 09/11/17 0931    Education provided  Yes    Education Details  mild increase in tone, mm imbalance as possible causes of knee stiffness.  Ice after therapy, heat first in morning and adding gastroc/soleus stretch to morning/evening stretches    Person(s) Educated  Patient;Spouse    Methods  Explanation;Demonstration    Comprehension  Verbalized understanding;Returned demonstration       PT Short Term Goals - 08/15/17 1730      PT SHORT TERM GOAL #1   Title  Pt will participate in further assessment of standing balance with BERG    Time  4    Period  Weeks    Status  Achieved      PT SHORT TERM GOAL #2   Title  Pt will participate in initial fitting and training of bilat Bioness    Time  4    Period  Weeks    Status  New     Target Date  09/13/17      PT SHORT TERM GOAL #3   Title  Pt will improve five time sit <> stand from arm chair to </= 15 seconds with more equal WB through bilat LE and improved control during stand > sit    Baseline  13 seconds but with L foot forwards, pushing up with UE  and uncontrolled descent    Time  4    Period  Weeks    Status  New    Target Date  09/13/17      PT SHORT TERM GOAL #4   Title  Pt will improve gait velocity with RW to >/= 1.8 ft/sec    Baseline  1.6 ft/sec with RW    Time  4    Period  Weeks    Status  New    Target Date  09/13/17      PT SHORT TERM GOAL #5   Title  Pt will negotiate 12 stairs with 2 rails, alternating sequence with supervision    Time  4    Period  Weeks    Status  New    Target Date  09/13/17        PT Long Term Goals - 08/14/17 1232      PT LONG TERM GOAL #1   Title  Pt will be independent with balance and LE strengthening HEP    Time  8    Period  Weeks    Status  New    Target Date  10/13/17      PT LONG TERM GOAL #2   Title  Pt will improve standing balance and BERG by 8 points to decrease falls risk    Baseline  TBD    Time  8    Period  Weeks    Status  New    Target Date  10/13/17      PT LONG TERM GOAL #3   Title  Pt will improve gait velocity to >/= 2.0 ft/sec with appropriate AFO and LRAD    Time  8    Period  Weeks    Status  New    Target Date  10/13/17      PT LONG TERM GOAL #4   Title  Pt will participate in AFO assessment if foot slap continues after 8 weeks of training with Bioness    Time  8    Period  Weeks    Status  New    Target Date  10/13/17      PT LONG TERM GOAL #5   Title  Pt will improve LE strength to perform five time sit <> stand without UE support in </= 15 seconds    Time  8    Period  Weeks    Status  New    Target Date  10/13/17      Additional Long Term Goals   Additional Long Term Goals  Yes      PT LONG TERM GOAL #6   Title  Pt will ambulate x 300' over indoor and  outdoor paved surfaces with cane/appropriate AFO and supervision; will negotiate 12 stairs, 2 rails alternating sequence MOD I    Time  8    Period  Weeks    Status  New    Target Date  10/13/17            Plan - 09/11/17 0932    Clinical Impression Statement  Pt is demonstrating slightly increased tone in LE today.  Continued to educate patient on recovery after incomplete spinal cord injury: hypertonicity, mm imbalance, importance of stretching.  Continued to focus on muscle activation, timing/sequencing with use of Bioness in gait and training mode for gait with cane, stair negotiation, weight shifting and single limb stance stability.  Pt reported decreased stiffness by end of session.  Recommended  use of ice and heat and gastroc/soleus stretch each day to decrease tone and stiffness.  Will continue to monitor and address and will continue to progress towards LTG.    PT Treatment/Interventions  ADLs/Self Care Home Management;Cryotherapy;Electrical Stimulation;Moist Heat;DME Instruction;Gait Network engineer;Therapeutic activities;Functional mobility training;Therapeutic exercise;Balance training;Neuromuscular re-education;Patient/family education;Orthotic Fit/Training;Passive range of motion;Taping    PT Next Visit Plan  BEGIN CHECKING STGS, ramp/curb/grass/stairs; core/back activities once cleared by surgeon.  Continue to address patient concerns re: recovery, sexual function, etc.    Consulted and Agree with Plan of Care  Patient;Family member/caregiver    Family Member Consulted  wife - "T"       Patient will benefit from skilled therapeutic intervention in order to improve the following deficits and impairments:  Abnormal gait, Decreased balance, Decreased coordination, Decreased strength, Difficulty walking, Increased muscle spasms, Impaired sensation, Pain  Visit Diagnosis: Other symptoms and signs involving the nervous system  Muscle weakness (generalized)  Other  abnormalities of gait and mobility  Unsteadiness  Paraplegia, incomplete (HCC)  Other disturbances of skin sensation     Problem List Patient Active Problem List   Diagnosis Date Noted  . Leukocytosis   . Neuropathic pain   . Drug induced constipation   . Urinary retention   . Class 1 obesity due to excess calories with serious comorbidity and body mass index (BMI) of 33.0 to 33.9 in adult   . Myelopathy (HCC) 08/01/2017  . Thoracic spinal stenosis 07/31/2017    Dierdre Highman, PT, DPT 09/11/17    2:31 PM    [facility] [address] [place], [place], [address] Phone: [phone]   Fax:  [phone]  Name: Scott Lane MRN: [id number] Date of Birth: 09-13-1985

## 2017-09-13 ENCOUNTER — Ambulatory Visit: Payer: 59 | Admitting: Physical Therapy

## 2017-09-13 ENCOUNTER — Encounter: Payer: Self-pay | Admitting: Physical Therapy

## 2017-09-13 DIAGNOSIS — R2689 Other abnormalities of gait and mobility: Secondary | ICD-10-CM | POA: Diagnosis not present

## 2017-09-13 DIAGNOSIS — M6281 Muscle weakness (generalized): Secondary | ICD-10-CM

## 2017-09-13 DIAGNOSIS — R2681 Unsteadiness on feet: Secondary | ICD-10-CM | POA: Diagnosis not present

## 2017-09-13 DIAGNOSIS — R208 Other disturbances of skin sensation: Secondary | ICD-10-CM | POA: Diagnosis not present

## 2017-09-13 DIAGNOSIS — G8222 Paraplegia, incomplete: Secondary | ICD-10-CM

## 2017-09-13 DIAGNOSIS — R29818 Other symptoms and signs involving the nervous system: Secondary | ICD-10-CM | POA: Diagnosis not present

## 2017-09-13 NOTE — Therapy (Signed)
[facility] [address] [place], [place], [address] Phone: [phone]   Fax:  [phone]  Physical Therapy Treatment  Patient Details  Name: Scott Lane MRN: [id number] Date of Birth: 09-20-1985 Referring Provider: Delice Lesch, MD   Encounter Date: 09/13/2017  PT End of Session - 09/13/17 0936    Visit Number  9    Number of Visits  17    Date for PT Re-Evaluation  10/13/17    Authorization Type  [place] Family Deductible.  VL: MN    PT Start Time  0806    PT Stop Time  0920    PT Time Calculation (min)  74 min    Equipment Utilized During Treatment  Other (comment) Bioness    Activity Tolerance  Patient tolerated treatment well    Behavior During Therapy  WFL for tasks assessed/performed       Past Medical History:  Diagnosis Date  . Chicken pox     Past Surgical History:  Procedure Laterality Date  . LUMBAR LAMINECTOMY/DECOMPRESSION MICRODISCECTOMY N/A 08/01/2017   Procedure: Thoracic nine, ten, eleven, twelve laminectomy;  Surgeon: Eustace Moore, MD;  Location: [place];  Service: Neurosurgery;  Laterality: N/A;  . none      There were no vitals filed for this visit.  Subjective Assessment - 09/13/17 0809    Subjective  Still having some stiffness in L knee but put ice on it yesterday and it felt much better.  Put heat on it this morning but made it feel worse.  Ambulating slightly slower this morning.    Patient is accompained by:  Family member wife    Pertinent History  None - very active    Patient Stated Goals  To be more independent at home and improve LE strength    Currently in Pain?  No/denies    Pain Onset  More than a month ago         [facility] PT Assessment - 09/13/17 0851      Standardized Balance Assessment   Standardized Balance Assessment  Five Times Sit to Stand;10 meter walk test    Five times sit to stand comments   13.28 with use of UE to push up, feet even    10 Meter Walk   33.06 seconds with cane and Bioness or 0.99 ft/sec                     [facility] Adult PT Treatment/Exercise - 09/13/17 0921      Ambulation/Gait   Stairs  Yes    Stairs Assistance  5: Supervision    Stairs Assistance Details (indicate cue type and reason)  with bilat rails; still demonstrates increased difficulty advancing L tibia forwards when descending with RLE causing L heel to raise off step    Stair Management Technique  Two rails;Alternating pattern;Forwards    Number of Stairs  12    Height of Stairs  6      Knee/Hip Exercises: Aerobic   Tread Mill  with Bioness in gait mode performing gait training on treadmill at 0.5 mph with bilat UE support and focus on increased stance time on LLE with anterior pelvic rotation, terminal hip extension and anterior translation of tibia over stance foot with mod-max facilitation from therapist.  Pt did not report pain but reported it feeling abnormal for his typical gait.  Wife states that pt tends to walk and run on his "toes".  Knee/Hip Exercises: Standing   Walking with Sports Cord  with Bioness in training mode with theraband around LLE performing LLE single stepping forwards to focus on eccentric control of hamstring during swing phase and concentric strengthening to extend LLE with bilat UE support and 2-3 reps without UE support but with min A for balance and trunk control             PT Education - 09/13/17 0935    Education provided  Yes    Education Details  use of ice on L knee; progress towards goals.  Request clarification of activity restrictions/allowances from Dr. Ronnald Ramp on Monday    Person(s) Educated  Patient;Spouse    Methods  Explanation    Comprehension  Verbalized understanding       PT Short Term Goals - 09/13/17 0937      PT SHORT TERM GOAL #1   Title  Pt will participate in further assessment of standing balance with BERG    Baseline  22/56    Time  4    Period  Weeks    Status  Achieved       PT SHORT TERM GOAL #2   Title  Pt will participate in initial fitting and training of bilat Bioness    Time  4    Period  Weeks    Status  Achieved      PT SHORT TERM GOAL #3   Title  Pt will improve five time sit <> stand from arm chair to </= 15 seconds with more equal WB through bilat LE and improved control during stand > sit    Time  4    Period  Weeks    Status  Achieved      PT SHORT TERM GOAL #4   Title  Pt will improve gait velocity with RW to >/= 1.8 ft/sec    Baseline  1.6 ft/sec with RW, pt has progressed to cane - gait velocity slower with cane 0.99 ft/sec with cane and Bioness - LTG adjusted    Time  4    Period  Weeks    Status  Not Met      PT SHORT TERM GOAL #5   Title  Pt will negotiate 12 stairs with 2 rails, alternating sequence with supervision    Time  4    Period  Weeks    Status  Achieved        PT Long Term Goals - 09/13/17 9470      PT LONG TERM GOAL #1   Title  Pt will be independent with balance and LE strengthening HEP    Time  8    Period  Weeks    Status  New    Target Date  10/13/17      PT LONG TERM GOAL #2   Title  Pt will improve standing balance and BERG by 8 points to decrease falls risk    Baseline  22/56    Time  8    Period  Weeks    Status  Revised    Target Date  10/13/17      PT LONG TERM GOAL #3   Title  Pt will improve gait velocity to >/= 1.8 ft/sec with appropriate AFO/Bioness and LRAD    Baseline  .99 ft/sec with cane and Bioness    Time  8    Period  Weeks    Status  Revised    Target Date  10/13/17  PT LONG TERM GOAL #4   Title  Pt will participate in AFO assessment if foot slap continues after 8 weeks of training with Bioness    Time  8    Period  Weeks    Status  New    Target Date  10/13/17      PT LONG TERM GOAL #5   Title  Pt will improve LE strength to perform five time sit <> stand without UE support in </= 15 seconds    Baseline  13 seconds, UE to push up, equal WB through feet, controlled  descent    Time  8    Period  Weeks    Status  Revised    Target Date  10/13/17      PT LONG TERM GOAL #6   Title  Pt will ambulate x 300' over indoor and outdoor paved surfaces with cane/appropriate AFO and supervision; will negotiate 12 stairs, 2 rails alternating sequence MOD I    Time  8    Period  Weeks    Status  New    Target Date  10/13/17            Plan - 09/13/17 0936    Clinical Impression Statement  Treatment session focused on assessment of STG and ongoing gait training with use of Bioness, treadmill and theraband for concentric and eccentric strength training.  Pt met all but 1 STG but did not meet gait velocity goal as it was written for use of RW and pt is currently using SPC; LTG revised.  Pt continues to demonstrate impairments in core and LE strength, spasticity, balance and gait and will benefit from continued PT to address impairments to progress towards LTG.  Pt to discuss with surgeon on Monday any changes to activity restrictions or allowances.      Rehab Potential  Good    Clinical Impairments Affecting Rehab Potential  will have to have cervical laminectomy soon    PT Frequency  2x / week    PT Duration  8 weeks    PT Treatment/Interventions  ADLs/Self Care Home Management;Cryotherapy;Electrical Stimulation;Moist Heat;DME Instruction;Gait Scientist, forensic;Therapeutic activities;Functional mobility training;Therapeutic exercise;Balance training;Neuromuscular re-education;Patient/family education;Orthotic Fit/Training;Passive range of motion;Taping    PT Next Visit Plan  Did doctor clear pt for core and back strengthening?  If so: initiate core strengthening - tall kneeling, quadruped, bridges.  Bioness - treadmill, stairs, curb, ramp, obstacle negotiation training with cane; Stretch L ankle into DF; closed chain DF; Continue to address patient concerns re: recovery, spasticity, sexual function, etc.    Consulted and Agree with Plan of Care  Patient;Family  member/caregiver    Family Member Consulted  wife - "T"       Patient will benefit from skilled therapeutic intervention in order to improve the following deficits and impairments:  Abnormal gait, Decreased balance, Decreased coordination, Decreased strength, Difficulty walking, Increased muscle spasms, Impaired sensation, Pain  Visit Diagnosis: Other symptoms and signs involving the nervous system  Muscle weakness (generalized)  Other abnormalities of gait and mobility  Unsteadiness  Paraplegia, incomplete (HCC)  Other disturbances of skin sensation     Problem List Patient Active Problem List   Diagnosis Date Noted  . Leukocytosis   . Neuropathic pain   . Drug induced constipation   . Urinary retention   . Class 1 obesity due to excess calories with serious comorbidity and body mass index (BMI) of 33.0 to 33.9 in adult   . Myelopathy ([place]) 08/01/2017  .  Thoracic spinal stenosis 07/31/2017    Rico Junker, PT, DPT 09/13/17    9:49 AM    [facility] [address] [place], [place], [address] Phone: [phone]   Fax:  [phone]  Name: Scott Lane MRN: [id number] Date of Birth: 1985-05-05

## 2017-09-18 ENCOUNTER — Encounter: Payer: Self-pay | Admitting: Physical Therapy

## 2017-09-18 ENCOUNTER — Ambulatory Visit: Payer: 59 | Admitting: Physical Therapy

## 2017-09-18 DIAGNOSIS — R2689 Other abnormalities of gait and mobility: Secondary | ICD-10-CM

## 2017-09-18 DIAGNOSIS — M6281 Muscle weakness (generalized): Secondary | ICD-10-CM

## 2017-09-18 DIAGNOSIS — R2681 Unsteadiness on feet: Secondary | ICD-10-CM | POA: Diagnosis not present

## 2017-09-18 DIAGNOSIS — R208 Other disturbances of skin sensation: Secondary | ICD-10-CM | POA: Diagnosis not present

## 2017-09-18 DIAGNOSIS — R29818 Other symptoms and signs involving the nervous system: Secondary | ICD-10-CM | POA: Diagnosis not present

## 2017-09-18 DIAGNOSIS — G8222 Paraplegia, incomplete: Secondary | ICD-10-CM | POA: Diagnosis not present

## 2017-09-18 NOTE — Patient Instructions (Signed)
Access Code: [email or web address]  URL: [email or web address]  Date: 09/18/2017  Prepared by: Veda CanningLynn Bryony Kaman   Exercises  Supine Posterior Pelvic Tilt - 5 reps - 1 sets - 5 seconds hold - 1x daily - 3x weekly  Beginner Bridge - 10 reps - 2-3 sets - 5 hold - 1x daily - 3x weekly  Quadruped Leg Lifts - 5 reps - 2-3 sets - 1x daily - 3x weekly

## 2017-09-18 NOTE — Therapy (Signed)
[facility] [address] [place], [place], [address] Phone: [phone]   Fax:  [phone]  Physical Therapy Treatment  Patient Details  Name: Scott Lane MRN: [id number] Date of Birth: [birth year] Referring Provider: Delice Lesch, MD   Encounter Date: 09/18/2017  PT End of Session - 09/18/17 0859    Visit Number  10    Number of Visits  17    Date for PT Re-Evaluation  10/13/17    Authorization Type  [place] Family Deductible.  VL: MN    PT Start Time  0807    PT Stop Time  0850    PT Time Calculation (min)  43 min    Equipment Utilized During Treatment  --    Activity Tolerance  Patient tolerated treatment well    Behavior During Therapy  WFL for tasks assessed/performed       Past Medical History:  Diagnosis Date  . Chicken pox     Past Surgical History:  Procedure Laterality Date  . LUMBAR LAMINECTOMY/DECOMPRESSION MICRODISCECTOMY N/A 08/01/2017   Procedure: Thoracic nine, ten, eleven, twelve laminectomy;  Surgeon: Eustace Moore, MD;  Location: [facility];  Service: Neurosurgery;  Laterality: N/A;  . none      There were no vitals filed for this visit.  Subjective Assessment - 09/18/17 0810    Subjective  Reports MD said he can do any abdominal or back exercises we feel are appropriate. Told him he could go back to gym and do only UE machines (no LE yet). Feels with left knee "stiffness" that his knee feels like it will give out (it hasn't but feels like it might).     Patient is accompained by:  Family member wife    Pertinent History  None - very active    Patient Stated Goals  To be more independent at home and improve LE strength    Currently in Pain?  No/denies    Pain Onset  More than a month ago                       [facility] Adult PT Treatment/Exercise - 09/18/17 1212      Ambulation/Gait   Ambulation/Gait Assistance  6: Modified independent (Device/Increase time);4: Min guard     Ambulation/Gait Assistance Details  RW vs with cane; pt reports incr stiffness in LLE and sense that knee will give out with "locking knee" when using cane    Ambulation Distance (Feet)  80 Feet 115    Assistive device  Rolling walker;Straight cane    Gait Pattern  Step-through pattern;Decreased step length - right;Decreased stride length;Decreased weight shift to left;Left genu recurvatum;Step-to pattern;Decreased step length - left    Ambulation Surface  Level;Indoor      Lumbar Exercises: Supine   Pelvic Tilt  10 reps;5 seconds more abd "set" in neutral spine (without posterior tilt)    Bent Knee Raise  10 reps alternating legs; core "set" then lift leg    Bridge  10 reps;5 seconds instructed to "set" core then lift 1.5-2" only      Lumbar Exercises: Quadruped   Straight Leg Raise  5 reps;3 seconds 5 reps each leg; assist to level pelvis; not full extension      Quadruped on mat table (pt reports he got down on his floor yesterday to stretch and was able to get up). Required up to min assist for balance when extending RLE and stabilizing on his  LLE.   Time spent discussing and assessing his "stiffness" in left knee. He feels it is "just about to give out" but has not. No swelling noted. +incr tone and pt just started on minimal dose of baclofen. Is to return to MD in July to probably increase the dose. Discussed what "increased tone" means and can vary throughout the day and situations.       PT Education - 09/18/17 0859    Education Details  see additions to HEP    Person(s) Educated  Patient    Methods  Explanation;Demonstration;Tactile cues;Verbal cues;Handout    Comprehension  Verbalized understanding;Returned demonstration;Verbal cues required;Tactile cues required;Need further instruction       PT Short Term Goals - 09/13/17 3818      PT SHORT TERM GOAL #1   Title  Pt will participate in further assessment of standing balance with BERG    Baseline  22/56    Time  4     Period  Weeks    Status  Achieved      PT SHORT TERM GOAL #2   Title  Pt will participate in initial fitting and training of bilat Bioness    Time  4    Period  Weeks    Status  Achieved      PT SHORT TERM GOAL #3   Title  Pt will improve five time sit <> stand from arm chair to </= 15 seconds with more equal WB through bilat LE and improved control during stand > sit    Time  4    Period  Weeks    Status  Achieved      PT SHORT TERM GOAL #4   Title  Pt will improve gait velocity with RW to >/= 1.8 ft/sec    Baseline  1.6 ft/sec with RW, pt has progressed to cane - gait velocity slower with cane 0.99 ft/sec with cane and Bioness - LTG adjusted    Time  4    Period  Weeks    Status  Not Met      PT SHORT TERM GOAL #5   Title  Pt will negotiate 12 stairs with 2 rails, alternating sequence with supervision    Time  4    Period  Weeks    Status  Achieved        PT Long Term Goals - 09/13/17 2993      PT LONG TERM GOAL #1   Title  Pt will be independent with balance and LE strengthening HEP    Time  8    Period  Weeks    Status  New    Target Date  10/13/17      PT LONG TERM GOAL #2   Title  Pt will improve standing balance and BERG by 8 points to decrease falls risk    Baseline  22/56    Time  8    Period  Weeks    Status  Revised    Target Date  10/13/17      PT LONG TERM GOAL #3   Title  Pt will improve gait velocity to >/= 1.8 ft/sec with appropriate AFO/Bioness and LRAD    Baseline  .99 ft/sec with cane and Bioness    Time  8    Period  Weeks    Status  Revised    Target Date  10/13/17      PT LONG TERM GOAL #4   Title  Pt will  participate in AFO assessment if foot slap continues after 8 weeks of training with Bioness    Time  8    Period  Weeks    Status  New    Target Date  10/13/17      PT LONG TERM GOAL #5   Title  Pt will improve LE strength to perform five time sit <> stand without UE support in </= 15 seconds    Baseline  13 seconds, UE to push  up, equal WB through feet, controlled descent    Time  8    Period  Weeks    Status  Revised    Target Date  10/13/17      PT LONG TERM GOAL #6   Title  Pt will ambulate x 300' over indoor and outdoor paved surfaces with cane/appropriate AFO and supervision; will negotiate 12 stairs, 2 rails alternating sequence MOD I    Time  8    Period  Weeks    Status  New    Target Date  10/13/17            Plan - 09/18/17 0824    Clinical Impression Statement  Patient arrived late for session and reported MD "signed off" on his progressing to trunk exercises (he forgot piece of paper, but will bring it in). Bioness deferred due to limited time and to initiate core exercises and add to his HEP. He did an excellent job learning how to engage core. Pt disappointed that I recommended he use his walker (since he is not trusting his left knee and locking it in hyperextension during stance when using cane). With RW he is able to maintain left knee stability in stance ~75% of steps.     Rehab Potential  Good    Clinical Impairments Affecting Rehab Potential  will have to have cervical laminectomy soon    PT Frequency  2x / week    PT Duration  8 weeks    PT Treatment/Interventions  ADLs/Self Care Home Management;Cryotherapy;Electrical Stimulation;Moist Heat;DME Instruction;Gait Scientist, forensic;Therapeutic activities;Functional mobility training;Therapeutic exercise;Balance training;Neuromuscular re-education;Patient/family education;Orthotic Fit/Training;Passive range of motion;Taping    PT Next Visit Plan  Did he bring signed doctor note to clear pt for core and back strengthening?  If so: have scanned into chart;  use bioness for walking (he was bummed we didn't do it 6/12); ?check technique for 6/12 additions to HEP; core strengthening - tall kneeling, quadruped, bridges.  Bioness - treadmill, stairs, curb, ramp, obstacle negotiation training with cane; Stretch L ankle into DF; closed chain DF;  Continue to address patient concerns re: recovery, spasticity, sexual function, etc.    Consulted and Agree with Plan of Care  Patient;Family member/caregiver    Family Member Consulted  wife - "T"       Patient will benefit from skilled therapeutic intervention in order to improve the following deficits and impairments:  Abnormal gait, Decreased balance, Decreased coordination, Decreased strength, Difficulty walking, Increased muscle spasms, Impaired sensation, Pain  Visit Diagnosis: Other symptoms and signs involving the nervous system  Muscle weakness (generalized)  Other abnormalities of gait and mobility     Problem List Patient Active Problem List   Diagnosis Date Noted  . Leukocytosis   . Neuropathic pain   . Drug induced constipation   . Urinary retention   . Class 1 obesity due to excess calories with serious comorbidity and body mass index (BMI) of 33.0 to 33.9 in adult   . Myelopathy ([place]) 08/01/2017  .  Thoracic spinal stenosis 07/31/2017    Rexanne Mano , PT 09/18/2017, 12:31 PM  [facility] [address] [place], [place], [address] Phone: [phone]   Fax:  [phone]  Name: Scott Lane MRN: [id number] Date of Birth: 12-08-1985

## 2017-09-19 ENCOUNTER — Encounter: Payer: Self-pay | Admitting: Physical Medicine & Rehabilitation

## 2017-09-19 ENCOUNTER — Encounter: Payer: 59 | Attending: Physical Medicine & Rehabilitation | Admitting: Physical Medicine & Rehabilitation

## 2017-09-19 ENCOUNTER — Other Ambulatory Visit: Payer: Self-pay

## 2017-09-19 VITALS — BP 119/82 | HR 80 | Ht 72.0 in | Wt 241.0 lb

## 2017-09-19 DIAGNOSIS — N319 Neuromuscular dysfunction of bladder, unspecified: Secondary | ICD-10-CM

## 2017-09-19 DIAGNOSIS — R339 Retention of urine, unspecified: Secondary | ICD-10-CM | POA: Insufficient documentation

## 2017-09-19 DIAGNOSIS — M961 Postlaminectomy syndrome, not elsewhere classified: Secondary | ICD-10-CM | POA: Diagnosis not present

## 2017-09-19 DIAGNOSIS — Z801 Family history of malignant neoplasm of trachea, bronchus and lung: Secondary | ICD-10-CM | POA: Insufficient documentation

## 2017-09-19 DIAGNOSIS — M792 Neuralgia and neuritis, unspecified: Secondary | ICD-10-CM | POA: Diagnosis not present

## 2017-09-19 DIAGNOSIS — Z8249 Family history of ischemic heart disease and other diseases of the circulatory system: Secondary | ICD-10-CM | POA: Insufficient documentation

## 2017-09-19 DIAGNOSIS — R269 Unspecified abnormalities of gait and mobility: Secondary | ICD-10-CM | POA: Diagnosis not present

## 2017-09-19 DIAGNOSIS — M62838 Other muscle spasm: Secondary | ICD-10-CM

## 2017-09-19 DIAGNOSIS — G959 Disease of spinal cord, unspecified: Secondary | ICD-10-CM | POA: Diagnosis not present

## 2017-09-19 DIAGNOSIS — M4714 Other spondylosis with myelopathy, thoracic region: Secondary | ICD-10-CM | POA: Diagnosis not present

## 2017-09-19 DIAGNOSIS — M4804 Spinal stenosis, thoracic region: Secondary | ICD-10-CM | POA: Diagnosis not present

## 2017-09-19 DIAGNOSIS — K5900 Constipation, unspecified: Secondary | ICD-10-CM | POA: Insufficient documentation

## 2017-09-19 NOTE — Progress Notes (Signed)
Subjective:    Patient ID: Scott Lane, male    DOB: 29-Apr-1985, 32 y.o.   MRN: [id number]  HPI This is a 32 year old right-handed male with unremarkable past medical history presents for follow up for thoracic myelopathy.   Last clinic visit 08/16/17.  Since that time, pt states he has been trying to be more active and his legs are little more fatigued.  He is using the cane.  He is still in therapies for ~another month. He saw Neurosurg and was prescribed some Baclofen, but otherwise is doing.  Pain is controlled. Bowel movements have normalized.  He is able to void.  He had a fall when his left leg gave out.  He complains of left knee discomfort again (also complained while in hospital).   Pain Inventory Average Pain 1 Pain Right Now 0 My pain is intermittent, burning and spasms  In the last 24 hours, has pain interfered with the following? General activity 0 Relation with others 0 Enjoyment of life 0 What TIME of day is your pain at its worst? night Sleep (in general) Good  Pain is worse with: sitting and inactivity Pain improves with: pacing activities Relief from Meds: 8  Mobility walk with assistance use a walker ability to climb steps?  yes do you drive?  no  Function employed # of hrs/week . 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 Hypertension Mother   . Hypertension Father   . Lung cancer Father        smoker  . Hypertension Brother   . Hypertension Maternal Grandmother   . Lung cancer Maternal Grandmother        non smoker  . Hypertension Paternal Grandmother    Social History   Socioeconomic History  . Marital status: Married    Spouse name: Not on file  . Number of children: Not on file  . Years of education: Not on file  .  Highest education level: Not on file  Occupational History  . Not on file  Social Needs  . Financial resource strain: Not on file  . Food insecurity:    Worry: Not on file    Inability: Not on file  . Transportation needs:    Medical: Not on file    Non-medical: Not on file  Tobacco Use  . Smoking status: Never Smoker  . Smokeless tobacco: Never Used  Substance and Sexual Activity  . Alcohol use: Yes    Alcohol/week: 1.2 oz    Types: 2 Standard drinks or equivalent per week  . Drug use: No  . Sexual activity: Yes  Lifestyle  . Physical activity:    Days per week: Not on file    Minutes per session: Not on file  . Stress: Not on file  Relationships  . Social connections:    Talks on phone: Not on file    Gets together: Not on file    Attends religious service: Not on file    Active member of club or organization: Not on file    Attends meetings of clubs or organizations: Not on file    Relationship status: Not on file  Other Topics Concern  . Not on file  Social History Narrative   Married 2018. Wife pregnant with first child- daughter.  Wife works IT with [employer].       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    Past Surgical History:  Procedure Laterality Date  . LUMBAR LAMINECTOMY/DECOMPRESSION MICRODISCECTOMY N/A 08/01/2017   Procedure: Thoracic nine, ten, eleven, twelve laminectomy;  Surgeon: Tia Alert, MD;  Location: [facility];  Service: Neurosurgery;  Laterality: N/A;  . none     Past Medical History:  Diagnosis Date  . Chicken pox    BP 119/82   Pulse 80   Ht 6' (1.829 m)   Wt 241 lb (109.3 kg)   SpO2 97%   BMI 32.69 kg/m   Opioid Risk Score:   Fall Risk Score:  `1  Depression screen PHQ 2/9  Depression screen [facility] 2/9 09/19/2017 05/17/2017  Decreased Interest 0 0  Down, Depressed, Hopeless 0 0    PHQ - 2 Score 0 0    Review of Systems  Gastrointestinal: Positive for abdominal pain and constipation.  Musculoskeletal: Positive for back pain and gait problem.       Spasms   Neurological: Positive for numbness.  All other systems reviewed and are negative.      Objective:   Physical Exam Constitutional: He appears well-developed. NAD. HENT: Normocephalic and atraumatic.  Eyes: EOM are normal. No discharge.  Cardiovascular: RRR. No JVD. Respiratory: Effort normal and breath sounds normal.  GI: Bowel sounds are normal. He exhibits no distension.  Musc: Gait: Slow cadence No edema or tenderness in extremities Neurological: Patient is alert and oriented.   Follows full commands.  Motor: B/l UE 5/5 proximal to distal RLE: 4+-5/5 proximal to distal  LLE: 4+/5 proximal to distal  Skin: Surgical incision healed    Assessment & Plan:  This is a 32 year old right-handed male with unremarkable past medical history presents for follow up for thoracic myelopathy.   1. Thoracic stenosis with myelopathy, s/p decompressive thoracic laminectomy T9-10 and T10-11, T11-12.  No back brace required  Cont therapies  Cont follow up with Neurosurg  2. Pain Management  Relatively controlled at present  Changed Baclofen by Neurosurg   3. Constipation  Resolved  4.  Urinary retention   Appears to be improving  Has Urology appointment ED  5. Gait abnormality  Cont therapies  Cont cane/walker for safety

## 2017-09-20 ENCOUNTER — Encounter: Payer: Self-pay | Admitting: Physical Therapy

## 2017-09-20 ENCOUNTER — Ambulatory Visit: Payer: 59 | Admitting: Physical Therapy

## 2017-09-20 DIAGNOSIS — G8222 Paraplegia, incomplete: Secondary | ICD-10-CM | POA: Diagnosis not present

## 2017-09-20 DIAGNOSIS — M6281 Muscle weakness (generalized): Secondary | ICD-10-CM

## 2017-09-20 DIAGNOSIS — R2689 Other abnormalities of gait and mobility: Secondary | ICD-10-CM

## 2017-09-20 DIAGNOSIS — R29818 Other symptoms and signs involving the nervous system: Secondary | ICD-10-CM | POA: Diagnosis not present

## 2017-09-20 DIAGNOSIS — R2681 Unsteadiness on feet: Secondary | ICD-10-CM

## 2017-09-20 DIAGNOSIS — R208 Other disturbances of skin sensation: Secondary | ICD-10-CM | POA: Diagnosis not present

## 2017-09-20 NOTE — Therapy (Signed)
[facility] [address] [place], [place], [address] Phone: [phone]   Fax:  [phone]  Physical Therapy Treatment  Patient Details  Name: Scott Lane MRN: [id number] Date of Birth: [birth year] Referring Provider: Delice Lesch, MD   Encounter Date: 09/20/2017  PT End of Session - 09/20/17 1103    Visit Number  11    Number of Visits  17    Date for PT Re-Evaluation  10/13/17    Authorization Type  [place] Family Deductible.  VL: MN    PT Start Time  0805    PT Stop Time  0850    PT Time Calculation (min)  45 min    Equipment Utilized During Treatment  Other (comment) bioness    Activity Tolerance  Patient tolerated treatment well    Behavior During Therapy  WFL for tasks assessed/performed       Past Medical History:  Diagnosis Date  . Chicken pox     Past Surgical History:  Procedure Laterality Date  . LUMBAR LAMINECTOMY/DECOMPRESSION MICRODISCECTOMY N/A 08/01/2017   Procedure: Thoracic nine, ten, eleven, twelve laminectomy;  Surgeon: Eustace Moore, MD;  Location: [facility];  Service: Neurosurgery;  Laterality: N/A;  . none      There were no vitals filed for this visit.  Subjective Assessment - 09/20/17 0813    Subjective  Brought in paper releasing pt to do core and back strengthening, did not list any lifting restrictions.  Has been working out at the gym doing UE strengthening.  Went to football practice, had to go back to using the RW for longer distances and soft surfaces.  LE are a little tired but knee feels better after starting Baclofen.  Also notices that when he tries to hold on to items in his hand or carry items he drops them.    Patient is accompained by:  Family member wife    Pertinent History  None - very active    Patient Stated Goals  To be more independent at home and improve LE strength    Currently in Pain?  No/denies    Pain Onset  More than a month ago                        [facility] Adult PT Treatment/Exercise - 09/20/17 1037      Ambulation/Gait   Ambulation/Gait  Yes    Ambulation/Gait Assistance  4: Min assist    Ambulation/Gait Assistance Details  cane and then no AD with Bioness in gait mode focusing on increasing upright posture, full weight shift to LLE and full step length with RLE with cane and then with dual task of carrying tennis ball in LUE and then without AD with dual task of tossing ball between two hands with +2 for safety.  Pt still hesistant to put weight on LLE due to fear of buckling.  Performed 10' retro gait with no evidence of instability L knee    Ambulation Distance (Feet)  115 Feet    Assistive device  Straight cane    Gait Pattern  Step-through pattern;Decreased step length - right;Decreased stride length;Decreased weight shift to left;Step-to pattern;Decreased step length - left;Decreased arm swing - left;Decreased stance time - left;Decreased dorsiflexion - left    Ambulation Surface  Level;Indoor      Therapeutic Activites    Therapeutic Activities  Other Therapeutic Activities    Other Therapeutic Activities  Dual task training focusing  on attending to object in one hand while ambulating with cane, catch and release tennis ball in one hand during gait with cane and then tossing between two hands during alternating forwards/retro stepping in place and then with gait without AD (but with +2 for safety).  Pt did not drop the ball with any activity but did have to stop tossing from hand to hand to focus on gait and stabilize through L knee as LLE fatigued      Knee/Hip Exercises: Standing   Other Standing Knee Exercises  Side stepping along counter with UE support on counter performing wide squat and maintaining squat when stepping together for quad strengthening, weight shifting and knee control      Modalities   Modalities  Electrical Stimulation      Electrical Stimulation   Electrical Stimulation  Location  bilat anterior tibialis, L hamstring mm    Electrical Stimulation Action  Open and closed chain left hamstring, bilat anterior tibialis/dorsiflexion.    Electrical Stimulation Parameters  saved parameters in tablet 2, quick fit R electrode, steering L electrode    Electrical Stimulation Goals  Strength;Neuromuscular facilitation             PT Education - 09/20/17 1102    Education provided  Yes    Education Details  side stepping with squat    Person(s) Educated  Patient    Methods  Explanation;Demonstration    Comprehension  Verbalized understanding;Returned demonstration       PT Short Term Goals - 09/13/17 0937      PT SHORT TERM GOAL #1   Title  Pt will participate in further assessment of standing balance with BERG    Baseline  22/56    Time  4    Period  Weeks    Status  Achieved      PT SHORT TERM GOAL #2   Title  Pt will participate in initial fitting and training of bilat Bioness    Time  4    Period  Weeks    Status  Achieved      PT SHORT TERM GOAL #3   Title  Pt will improve five time sit <> stand from arm chair to </= 15 seconds with more equal WB through bilat LE and improved control during stand > sit    Time  4    Period  Weeks    Status  Achieved      PT SHORT TERM GOAL #4   Title  Pt will improve gait velocity with RW to >/= 1.8 ft/sec    Baseline  1.6 ft/sec with RW, pt has progressed to cane - gait velocity slower with cane 0.99 ft/sec with cane and Bioness - LTG adjusted    Time  4    Period  Weeks    Status  Not Met      PT SHORT TERM GOAL #5   Title  Pt will negotiate 12 stairs with 2 rails, alternating sequence with supervision    Time  4    Period  Weeks    Status  Achieved        PT Long Term Goals - 09/13/17 7169      PT LONG TERM GOAL #1   Title  Pt will be independent with balance and LE strengthening HEP    Time  8    Period  Weeks    Status  New    Target Date  10/13/17  PT LONG TERM GOAL #2   Title  Pt  will improve standing balance and BERG by 8 points to decrease falls risk    Baseline  22/56    Time  8    Period  Weeks    Status  Revised    Target Date  10/13/17      PT LONG TERM GOAL #3   Title  Pt will improve gait velocity to >/= 1.8 ft/sec with appropriate AFO/Bioness and LRAD    Baseline  .99 ft/sec with cane and Bioness    Time  8    Period  Weeks    Status  Revised    Target Date  10/13/17      PT LONG TERM GOAL #4   Title  Pt will participate in AFO assessment if foot slap continues after 8 weeks of training with Bioness    Time  8    Period  Weeks    Status  New    Target Date  10/13/17      PT LONG TERM GOAL #5   Title  Pt will improve LE strength to perform five time sit <> stand without UE support in </= 15 seconds    Baseline  13 seconds, UE to push up, equal WB through feet, controlled descent    Time  8    Period  Weeks    Status  Revised    Target Date  10/13/17      PT LONG TERM GOAL #6   Title  Pt will ambulate x 300' over indoor and outdoor paved surfaces with cane/appropriate AFO and supervision; will negotiate 12 stairs, 2 rails alternating sequence MOD I    Time  8    Period  Weeks    Status  New    Target Date  10/13/17            Plan - 09/20/17 1103    Clinical Impression Statement  Continued use of Bioness in gait and training mode for continued NMR and timing and sequencing of muscle activation during gait with cane single task and then during dual task of carrying, tossing object in LUE.  Pt able to perform safely while still using cane but unable to attend to UE task when ambulating without AD but this task was performed at the end of the session and pt began to experience significant fatigue in LLE.  Pt demonstrated decreased knee stiffness and spasticity in LLE today.  Continued to add dynamic strengthening and balance exercise to HEP; will provide handout at next session.  Will continue to progress towards LTG.    Rehab Potential  Good     Clinical Impairments Affecting Rehab Potential  will have to have cervical laminectomy soon    PT Frequency  2x / week    PT Duration  8 weeks    PT Treatment/Interventions  ADLs/Self Care Home Management;Cryotherapy;Electrical Stimulation;Moist Heat;DME Instruction;Gait Scientist, forensic;Therapeutic activities;Functional mobility training;Therapeutic exercise;Balance training;Neuromuscular re-education;Patient/family education;Orthotic Fit/Training;Passive range of motion;Taping    PT Next Visit Plan  Add to [employer]: wall squats, side step with squat; gait while carrying items; Core strengthening - tall kneeling, half kneel, quadruped, bridges.  Agility training; Bioness - outside on grass, treadmill, stairs, curb, ramp, obstacle negotiation training with cane; Stretch L ankle into DF; closed chain DF; Continue to address patient concerns re: recovery, spasticity, sexual function, etc.    PT Home Exercise Plan  [id number]    Consulted and Agree with Plan of  Care  Patient       Patient will benefit from skilled therapeutic intervention in order to improve the following deficits and impairments:  Abnormal gait, Decreased balance, Decreased coordination, Decreased strength, Difficulty walking, Increased muscle spasms, Impaired sensation, Pain  Visit Diagnosis: Other symptoms and signs involving the nervous system  Muscle weakness (generalized)  Other abnormalities of gait and mobility  Unsteadiness  Paraplegia, incomplete (HCC)  Other disturbances of skin sensation     Problem List Patient Active Problem List   Diagnosis Date Noted  . Leukocytosis   . Neuropathic pain   . Drug induced constipation   . Urinary retention   . Class 1 obesity due to excess calories with serious comorbidity and body mass index (BMI) of 33.0 to 33.9 in adult   . Myelopathy ([place]) 08/01/2017  . Thoracic spinal stenosis 07/31/2017    Rico Junker, PT, DPT 09/20/17    11:13 AM    [facility] [address] [place], [place], [address] Phone: [phone]   Fax:  [phone]  Name: RAYMAR JOINER MRN: [id number] Date of Birth: Jan 07, 1986

## 2017-09-23 ENCOUNTER — Ambulatory Visit: Payer: 59 | Admitting: Physical Therapy

## 2017-09-23 ENCOUNTER — Encounter: Payer: Self-pay | Admitting: Physical Therapy

## 2017-09-23 DIAGNOSIS — R29818 Other symptoms and signs involving the nervous system: Secondary | ICD-10-CM | POA: Diagnosis not present

## 2017-09-23 DIAGNOSIS — R2689 Other abnormalities of gait and mobility: Secondary | ICD-10-CM

## 2017-09-23 DIAGNOSIS — R2681 Unsteadiness on feet: Secondary | ICD-10-CM

## 2017-09-23 DIAGNOSIS — M6281 Muscle weakness (generalized): Secondary | ICD-10-CM | POA: Diagnosis not present

## 2017-09-23 DIAGNOSIS — R208 Other disturbances of skin sensation: Secondary | ICD-10-CM

## 2017-09-23 DIAGNOSIS — G8222 Paraplegia, incomplete: Secondary | ICD-10-CM | POA: Diagnosis not present

## 2017-09-23 NOTE — Therapy (Signed)
[facility] [address] [place], [place], [address] Phone: [phone]   Fax:  [phone]  Physical Therapy Treatment  Patient Details  Name: Scott Lane MRN: [id number] Date of Birth: 05/25/1985 Referring Provider: Delice Lesch, MD   Encounter Date: 09/23/2017  PT End of Session - 09/23/17 2206    Visit Number  12    Number of Visits  17    Date for PT Re-Evaluation  10/13/17    Authorization Type  [place] Family Deductible.  VL: MN    PT Start Time  0805    PT Stop Time  0848    PT Time Calculation (min)  43 min    Equipment Utilized During Treatment  Other (comment) bioness    Activity Tolerance  Patient tolerated treatment well    Behavior During Therapy  WFL for tasks assessed/performed       Past Medical History:  Diagnosis Date  . Chicken pox     Past Surgical History:  Procedure Laterality Date  . LUMBAR LAMINECTOMY/DECOMPRESSION MICRODISCECTOMY N/A 08/01/2017   Procedure: Thoracic nine, ten, eleven, twelve laminectomy;  Surgeon: Eustace Moore, MD;  Location: [facility];  Service: Neurosurgery;  Laterality: N/A;  . none      There were no vitals filed for this visit.  Subjective Assessment - 09/23/17 0809    Subjective  Knee continues to feel better, especially after taking the Baclofen.      Patient is accompained by:  Family member wife    Pertinent History  None - very active    Patient Stated Goals  To be more independent at home and improve LE strength    Currently in Pain?  No/denies    Pain Onset  More than a month ago                       [facility] Adult PT Treatment/Exercise - 09/23/17 0810      Ambulation/Gait   Ambulation/Gait  Yes    Ambulation/Gait Assistance  4: Min assist    Ambulation/Gait Assistance Details  Use of Bioness in gait mode but with thigh component on LLE to quad for improved knee stability in stance; therapist provided facilitation for full L  lateral weight shift and stance time LLE to allow full step length RLE    Ambulation Distance (Feet)  115 Feet    Assistive device  Straight cane    Gait Pattern  Step-through pattern;Decreased step length - right;Decreased stride length;Decreased weight shift to left;Step-to pattern;Decreased step length - left;Decreased arm swing - left;Decreased stance time - left;Decreased dorsiflexion - left    Ambulation Surface  Level;Indoor      Knee/Hip Exercises: Standing   Wall Squat  1 set;5 reps;10 seconds cues for LE/foot placement; Bioness training mode    Other Standing Knee Exercises  Side stepping along counter with UE support on counter performing wide squat and maintaining squat when stepping together for quad strengthening, weight shifting and knee control.  Use of Bioness initially on L hamstring mm x 2 sets to L and R changing to L quad mm for increased knee extension strengthening and control      Modalities   Modalities  Electrical Stimulation      Electrical Stimulation   Electrical Stimulation Location  bilat anterior tibialis, L hamstring mm    Electrical Stimulation Action  Open and closed chain left hamstring, bilat anterior tibialis/dorsiflexion.    Electrical Stimulation Parameters  saved parameters in tablet 2, quick fit R electrode, steering L electrode    Electrical Stimulation Goals  Strength;Neuromuscular facilitation             PT Education - 09/23/17 2206    Education provided  Yes    Education Details  reviewed side stepping with squat and wall squats    Person(s) Educated  Patient    Methods  Explanation;Demonstration    Comprehension  Returned demonstration       PT Short Term Goals - 09/13/17 0937      PT SHORT TERM GOAL #1   Title  Pt will participate in further assessment of standing balance with BERG    Baseline  22/56    Time  4    Period  Weeks    Status  Achieved      PT SHORT TERM GOAL #2   Title  Pt will participate in initial fitting  and training of bilat Bioness    Time  4    Period  Weeks    Status  Achieved      PT SHORT TERM GOAL #3   Title  Pt will improve five time sit <> stand from arm chair to </= 15 seconds with more equal WB through bilat LE and improved control during stand > sit    Time  4    Period  Weeks    Status  Achieved      PT SHORT TERM GOAL #4   Title  Pt will improve gait velocity with RW to >/= 1.8 ft/sec    Baseline  1.6 ft/sec with RW, pt has progressed to cane - gait velocity slower with cane 0.99 ft/sec with cane and Bioness - LTG adjusted    Time  4    Period  Weeks    Status  Not Met      PT SHORT TERM GOAL #5   Title  Pt will negotiate 12 stairs with 2 rails, alternating sequence with supervision    Time  4    Period  Weeks    Status  Achieved        PT Long Term Goals - 09/13/17 8921      PT LONG TERM GOAL #1   Title  Pt will be independent with balance and LE strengthening HEP    Time  8    Period  Weeks    Status  New    Target Date  10/13/17      PT LONG TERM GOAL #2   Title  Pt will improve standing balance and BERG by 8 points to decrease falls risk    Baseline  22/56    Time  8    Period  Weeks    Status  Revised    Target Date  10/13/17      PT LONG TERM GOAL #3   Title  Pt will improve gait velocity to >/= 1.8 ft/sec with appropriate AFO/Bioness and LRAD    Baseline  .99 ft/sec with cane and Bioness    Time  8    Period  Weeks    Status  Revised    Target Date  10/13/17      PT LONG TERM GOAL #4   Title  Pt will participate in AFO assessment if foot slap continues after 8 weeks of training with Bioness    Time  8    Period  Weeks    Status  New  Target Date  10/13/17      PT LONG TERM GOAL #5   Title  Pt will improve LE strength to perform five time sit <> stand without UE support in </= 15 seconds    Baseline  13 seconds, UE to push up, equal WB through feet, controlled descent    Time  8    Period  Weeks    Status  Revised    Target Date   10/13/17      PT LONG TERM GOAL #6   Title  Pt will ambulate x 300' over indoor and outdoor paved surfaces with cane/appropriate AFO and supervision; will negotiate 12 stairs, 2 rails alternating sequence MOD I    Time  8    Period  Weeks    Status  New    Target Date  10/13/17            Plan - 09/23/17 0848    Clinical Impression Statement  Continued to perform strengthening, NMR and gait training with use of functional electrical stimulation but adjusting placement of L upper thigh cuff to quadriceps muscles to provide LLE increased stability during exercises and gait.  Pt tolerated well and reported improvement in stability with stimulation at quads.  By end of session pt reporting significant LE fatigue increasing difficulty with wall squats.  Did not provide wall squats for HEP at this time.      Rehab Potential  Good    Clinical Impairments Affecting Rehab Potential  will have to have cervical laminectomy soon    PT Frequency  2x / week    PT Duration  8 weeks    PT Treatment/Interventions  ADLs/Self Care Home Management;Cryotherapy;Electrical Stimulation;Moist Heat;DME Instruction;Gait Scientist, forensic;Therapeutic activities;Functional mobility training;Therapeutic exercise;Balance training;Neuromuscular re-education;Patient/family education;Orthotic Fit/Training;Passive range of motion;Taping    PT Next Visit Plan  On wednesday: add more visits; discuss baby care activities; Add to [place]: wall squats, side step with squat; gait while carrying items; Core strengthening - tall kneeling, half kneel, quadruped, bridges.  Agility training; Bioness - outside on grass, treadmill, stairs, curb, ramp, obstacle negotiation training with cane; Stretch L ankle into DF; closed chain DF; Continue to address patient concerns re: recovery, spasticity, sexual function, etc.    PT Home Exercise Plan  [id number]    Consulted and Agree with Plan of Care  Patient       Patient will benefit  from skilled therapeutic intervention in order to improve the following deficits and impairments:  Abnormal gait, Decreased balance, Decreased coordination, Decreased strength, Difficulty walking, Increased muscle spasms, Impaired sensation, Pain  Visit Diagnosis: Other symptoms and signs involving the nervous system  Muscle weakness (generalized)  Other abnormalities of gait and mobility  Unsteadiness  Other disturbances of skin sensation  Paraplegia, incomplete ([address])     Problem List Patient Active Problem List   Diagnosis Date Noted  . Leukocytosis   . Neuropathic pain   . Drug induced constipation   . Urinary retention   . Class 1 obesity due to excess calories with serious comorbidity and body mass index (BMI) of 33.0 to 33.9 in adult   . Myelopathy ([place]) 08/01/2017  . Thoracic spinal stenosis 07/31/2017    Rico Junker, PT, DPT 09/23/17    10:13 PM    [facility] [address] [place], [place], [address] Phone: [phone]   Fax:  [phone]  Name: Scott Lane MRN: [id number] Date of Birth: 07/28/1985

## 2017-09-25 ENCOUNTER — Telehealth: Payer: Self-pay | Admitting: Physical Therapy

## 2017-09-25 ENCOUNTER — Ambulatory Visit: Payer: 59 | Admitting: Physical Therapy

## 2017-09-25 ENCOUNTER — Encounter: Payer: Self-pay | Admitting: Physical Therapy

## 2017-09-25 DIAGNOSIS — G8222 Paraplegia, incomplete: Secondary | ICD-10-CM | POA: Diagnosis not present

## 2017-09-25 DIAGNOSIS — R2681 Unsteadiness on feet: Secondary | ICD-10-CM | POA: Diagnosis not present

## 2017-09-25 DIAGNOSIS — R29818 Other symptoms and signs involving the nervous system: Secondary | ICD-10-CM

## 2017-09-25 DIAGNOSIS — M6281 Muscle weakness (generalized): Secondary | ICD-10-CM

## 2017-09-25 DIAGNOSIS — R2689 Other abnormalities of gait and mobility: Secondary | ICD-10-CM

## 2017-09-25 DIAGNOSIS — R208 Other disturbances of skin sensation: Secondary | ICD-10-CM | POA: Diagnosis not present

## 2017-09-25 NOTE — Therapy (Signed)
[facility] [address] [place], [place], [address] Phone: [phone]   Fax:  [phone]  Physical Therapy Treatment  Patient Details  Name: Scott Lane MRN: [id number] Date of Birth: [birth year] Referring Provider: Delice Lesch, MD   Encounter Date: 09/25/2017  PT End of Session - 09/25/17 2050    Visit Number  13    Number of Visits  17    Date for PT Re-Evaluation  10/13/17    Authorization Type  [place] Family Deductible.  VL: MN    PT Start Time  0805    PT Stop Time  0853    PT Time Calculation (min)  48 min    Activity Tolerance  Patient tolerated treatment well    Behavior During Therapy  WFL for tasks assessed/performed       Past Medical History:  Diagnosis Date  . Chicken pox     Past Surgical History:  Procedure Laterality Date  . LUMBAR LAMINECTOMY/DECOMPRESSION MICRODISCECTOMY N/A 08/01/2017   Procedure: Thoracic nine, ten, eleven, twelve laminectomy;  Surgeon: Eustace Moore, MD;  Location: [facility];  Service: Neurosurgery;  Laterality: N/A;  . none      There were no vitals filed for this visit.  Subjective Assessment - 09/25/17 0812    Subjective  L knee is a little more stiff this morning.  Discussed aquatic therapy and pt's concerns about being able to assist with new baby due in August.    Patient is accompained by:  Family member wife    Pertinent History  None - very active    Patient Stated Goals  To be more independent at home and improve LE strength    Currently in Pain?  Yes    Pain Onset  More than a month ago                       [facility] Adult PT Treatment/Exercise - 09/25/17 2034      Therapeutic Activites    Therapeutic Activities  Other Therapeutic Activities    Other Therapeutic Activities  Discussed with pt option of adding aquatic therapy to current POC, focus of aquatic therapy and benefits of aquatic therapy.  Pt is agreeable and would like  participate in aquatic therapy.  Will follow up with physician and insurance.  Also discussed wife's due date and importance of beginning to incorporate infant caregiving activities into treatment sessions.  Pt is concerned about his ability to assist his wife in caring for their daughter; pt assumed he would be walking independently by August.      Lumbar Exercises: Stretches   Sports administrator  Left;Right;5 reps;10 seconds rocking from quadruped > child's pose       Lumbar Exercises: Quadruped   Straight Leg Raise  5 reps;3 seconds alternating; cues to level pelvis, not full LLE extension      Knee/Hip Exercises: Stretches   Hip Flexor Stretch  Right;Left;2 reps;30 seconds half kneeling leaning forwards to stretch back hip      Knee/Hip Exercises: Standing   Wall Squat  1 set;10 seconds 3 reps with box under patient; fatigued quickly      Knee/Hip Exercises: Seated   Other Seated Knee/Hip Exercises  Half kneeling on R and LLE maintaining balance x 10 seconds each side without UE support for core and proximal hip stability training             PT Education - 09/25/17 2050  Education provided  Yes    Education Details  see TA    Person(s) Educated  Patient    Methods  Explanation;Demonstration    Comprehension  Verbalized understanding;Returned demonstration       PT Short Term Goals - 09/13/17 0937      PT SHORT TERM GOAL #1   Title  Pt will participate in further assessment of standing balance with BERG    Baseline  22/56    Time  4    Period  Weeks    Status  Achieved      PT SHORT TERM GOAL #2   Title  Pt will participate in initial fitting and training of bilat Bioness    Time  4    Period  Weeks    Status  Achieved      PT SHORT TERM GOAL #3   Title  Pt will improve five time sit <> stand from arm chair to </= 15 seconds with more equal WB through bilat LE and improved control during stand > sit    Time  4    Period  Weeks    Status  Achieved      PT SHORT  TERM GOAL #4   Title  Pt will improve gait velocity with RW to >/= 1.8 ft/sec    Baseline  1.6 ft/sec with RW, pt has progressed to cane - gait velocity slower with cane 0.99 ft/sec with cane and Bioness - LTG adjusted    Time  4    Period  Weeks    Status  Not Met      PT SHORT TERM GOAL #5   Title  Pt will negotiate 12 stairs with 2 rails, alternating sequence with supervision    Time  4    Period  Weeks    Status  Achieved        PT Long Term Goals - 09/13/17 7342      PT LONG TERM GOAL #1   Title  Pt will be independent with balance and LE strengthening HEP    Time  8    Period  Weeks    Status  New    Target Date  10/13/17      PT LONG TERM GOAL #2   Title  Pt will improve standing balance and BERG by 8 points to decrease falls risk    Baseline  22/56    Time  8    Period  Weeks    Status  Revised    Target Date  10/13/17      PT LONG TERM GOAL #3   Title  Pt will improve gait velocity to >/= 1.8 ft/sec with appropriate AFO/Bioness and LRAD    Baseline  .99 ft/sec with cane and Bioness    Time  8    Period  Weeks    Status  Revised    Target Date  10/13/17      PT LONG TERM GOAL #4   Title  Pt will participate in AFO assessment if foot slap continues after 8 weeks of training with Bioness    Time  8    Period  Weeks    Status  New    Target Date  10/13/17      PT LONG TERM GOAL #5   Title  Pt will improve LE strength to perform five time sit <> stand without UE support in </= 15 seconds    Baseline  13 seconds, UE  to push up, equal WB through feet, controlled descent    Time  8    Period  Weeks    Status  Revised    Target Date  10/13/17      PT LONG TERM GOAL #6   Title  Pt will ambulate x 300' over indoor and outdoor paved surfaces with cane/appropriate AFO and supervision; will negotiate 12 stairs, 2 rails alternating sequence MOD I    Time  8    Period  Weeks    Status  New    Target Date  10/13/17            Plan - 09/25/17 2051     Clinical Impression Statement  Treatment session today focused on discussion with pt regarding other treatment options - aquatics- and ways to incorporate infant care activities into treatment sessions to begin to prepare for birth of his daughter in August.  Pt is concerned he will not be fully ambulatory by the time his daughter is born; pt feels his L knee instability is the biggest barrier to ambulating independently.  Unable to utilize Bioness functional electrical stimulation during today's session; instead focused on use of WB positions in quadruped, tall kneeling and wall squats to improve ROM, strengthening, postural control and motor planning.  Pt continues to demonstrate increased LLE spasticity, decreased LLE ROM and strength and decreased core strength during these activities.  Pt discouraged about progress; continued to discuss prognosis of neural recovery after spinal cord injury.  Will continue to address and progress towards LTG.    Rehab Potential  Good    Clinical Impairments Affecting Rehab Potential  will have to have cervical laminectomy soon    PT Frequency  2x / week    PT Duration  8 weeks    PT Treatment/Interventions  ADLs/Self Care Home Management;Cryotherapy;Electrical Stimulation;Moist Heat;DME Instruction;Gait Scientist, forensic;Therapeutic activities;Functional mobility training;Therapeutic exercise;Balance training;Neuromuscular re-education;Patient/family education;Orthotic Fit/Training;Passive range of motion;Taping    PT Next Visit Plan  Needs to add more visits - add aquatics if insurance approves; begin to incorporate infant care activities and gait while carrying items into treatment sessions; Core strengthening - tall kneeling, half kneel, quadruped, bridges.  Agility training; Bioness - outside on grass, treadmill, stairs, curb, ramp, obstacle negotiation training with cane; Stretch L ankle into DF; closed chain DF; Continue to address patient concerns re:  recovery, spasticity, sexual function, etc.    PT Home Exercise Plan  [id number]    baby daughter is due August 8th - begin to incorporate infant care activities into therapy sessions    Consulted and Agree with Plan of Care  Patient       Patient will benefit from skilled therapeutic intervention in order to improve the following deficits and impairments:  Abnormal gait, Decreased balance, Decreased coordination, Decreased strength, Difficulty walking, Increased muscle spasms, Impaired sensation, Pain  Visit Diagnosis: Other symptoms and signs involving the nervous system  Muscle weakness (generalized)  Other abnormalities of gait and mobility  Unsteadiness  Other disturbances of skin sensation  Paraplegia, incomplete ([place])     Problem List Patient Active Problem List   Diagnosis Date Noted  . Leukocytosis   . Neuropathic pain   . Drug induced constipation   . Urinary retention   . Class 1 obesity due to excess calories with serious comorbidity and body mass index (BMI) of 33.0 to 33.9 in adult   . Myelopathy ([place]) 08/01/2017  . Thoracic spinal stenosis 07/31/2017    Rico Junker,  PT, DPT 09/25/17    9:36 PM    [facility] [address] [place], [place], [address] Phone: [phone]   Fax:  [phone]  Name: Scott Lane MRN: [id number] Date of Birth: July 11, 1985

## 2017-09-25 NOTE — Telephone Encounter (Signed)
Hello Dr. Yetta BarreJones, [facility].  Do you have any concerns with him participating in aquatic therapy?  Are there any spinal precautions we would need to continue when prescribing exercises?  Thank you, Dierdre HighmanAudra F Luz Burcher, PT, DPT 09/25/17    9:46 PM

## 2017-09-30 ENCOUNTER — Ambulatory Visit: Payer: 59 | Admitting: Physical Therapy

## 2017-09-30 ENCOUNTER — Encounter: Payer: Self-pay | Admitting: Physical Therapy

## 2017-09-30 DIAGNOSIS — R2681 Unsteadiness on feet: Secondary | ICD-10-CM | POA: Diagnosis not present

## 2017-09-30 DIAGNOSIS — R2689 Other abnormalities of gait and mobility: Secondary | ICD-10-CM

## 2017-09-30 DIAGNOSIS — G8222 Paraplegia, incomplete: Secondary | ICD-10-CM

## 2017-09-30 DIAGNOSIS — M6281 Muscle weakness (generalized): Secondary | ICD-10-CM

## 2017-09-30 DIAGNOSIS — R208 Other disturbances of skin sensation: Secondary | ICD-10-CM | POA: Diagnosis not present

## 2017-09-30 DIAGNOSIS — R29818 Other symptoms and signs involving the nervous system: Secondary | ICD-10-CM

## 2017-10-01 NOTE — Therapy (Signed)
[facility] [address] [place], [place], [address] Phone: [phone]   Fax:  [phone]  Physical Therapy Treatment  Patient Details  Name: Scott Lane MRN: [id number] Date of Birth: Apr 07, 1986 Referring Provider: Delice Lesch, MD   Encounter Date: 09/30/2017  PT End of Session - 10/01/17 1035    Visit Number  14    Number of Visits  17    Date for PT Re-Evaluation  10/13/17    Authorization Type  [place] Family Deductible.  VL: MN    PT Start Time  0805    PT Stop Time  0848    PT Time Calculation (min)  43 min    Equipment Utilized During Treatment  Other (comment) Bioness    Activity Tolerance  Patient tolerated treatment well    Behavior During Therapy  WFL for tasks assessed/performed       Past Medical History:  Diagnosis Date  . Chicken pox     Past Surgical History:  Procedure Laterality Date  . LUMBAR LAMINECTOMY/DECOMPRESSION MICRODISCECTOMY N/A 08/01/2017   Procedure: Thoracic nine, ten, eleven, twelve laminectomy;  Surgeon: Eustace Moore, MD;  Location: [facility];  Service: Neurosurgery;  Laterality: N/A;  . none      There were no vitals filed for this visit.  Subjective Assessment - 09/30/17 0807    Subjective  L knee is "okay".  Was mentally fatigued on Wednesday after therapy.    Patient is accompained by:  Family member wife    Pertinent History  None - very active    Patient Stated Goals  To be more independent at home and improve LE strength    Currently in Pain?  No/denies    Pain Onset  More than a month ago                       [facility] Adult PT Treatment/Exercise - 10/01/17 1029      Ambulation/Gait   Ambulation/Gait  Yes    Ambulation/Gait Assistance  4: Min assist    Ambulation/Gait Assistance Details  use of Bioness in gait mode and simulated task of carrying baby carrier with 7lb over level indoor surfaces and then up/down stairs to prepare for birth of  patient's daughter.      Ambulation Distance (Feet)  115 Feet    Assistive device  Straight cane    Gait Pattern  Step-through pattern;Decreased step length - right;Decreased stride length;Decreased weight shift to left;Step-to pattern;Decreased step length - left;Decreased arm swing - left;Decreased stance time - left;Decreased dorsiflexion - left    Ambulation Surface  Level;Indoor    Stairs  Yes    Stairs Assistance  4: Min assist    Stairs Assistance Details (indicate cue type and reason)  rail in [place], simulated baby carrier with 7lb in LUE.  Pt performed step to due to increased caution with stair negotiation    Stair Management Technique  One rail Right;Step to pattern;Forwards    Number of Stairs  4    Height of Stairs  6      Knee/Hip Exercises: Aerobic   Elliptical  Level 2.0 x 8 minutes with Bioness in gait mode and therapist assisting with maintaining LLE in neutral rotation and foot placement for endurance training, decrease spasticity in L knee and simulate gait cycle.  Performed 1-2 minutes of intermittently removing UE support for increased activation of LE      Modalities   Modalities  Teacher, English as a foreign language Location  bilat anterior tibialis, L quad mm    Electrical Stimulation Action  Open and closed chain hip flexion, knee extension for stability, ankle DF    Electrical Stimulation Parameters  saved parameters in tablet 2, quick fit R electrode, steering L electrode    Electrical Stimulation Goals  Strength;Neuromuscular facilitation             PT Education - 10/01/17 1034    Education provided  Yes    Education Details  addition of infant care activities to therapy - gait while carrying simulated baby carrier; possible addition of AFO for stability on compliant surfaces    Person(s) Educated  Patient    Methods  Explanation    Comprehension  Need further instruction       PT Short Term Goals - 09/13/17  0937      PT SHORT TERM GOAL #1   Title  Pt will participate in further assessment of standing balance with BERG    Baseline  22/56    Time  4    Period  Weeks    Status  Achieved      PT SHORT TERM GOAL #2   Title  Pt will participate in initial fitting and training of bilat Bioness    Time  4    Period  Weeks    Status  Achieved      PT SHORT TERM GOAL #3   Title  Pt will improve five time sit <> stand from arm chair to </= 15 seconds with more equal WB through bilat LE and improved control during stand > sit    Time  4    Period  Weeks    Status  Achieved      PT SHORT TERM GOAL #4   Title  Pt will improve gait velocity with RW to >/= 1.8 ft/sec    Baseline  1.6 ft/sec with RW, pt has progressed to cane - gait velocity slower with cane 0.99 ft/sec with cane and Bioness - LTG adjusted    Time  4    Period  Weeks    Status  Not Met      PT SHORT TERM GOAL #5   Title  Pt will negotiate 12 stairs with 2 rails, alternating sequence with supervision    Time  4    Period  Weeks    Status  Achieved        PT Long Term Goals - 09/13/17 3267      PT LONG TERM GOAL #1   Title  Pt will be independent with balance and LE strengthening HEP    Time  8    Period  Weeks    Status  New    Target Date  10/13/17      PT LONG TERM GOAL #2   Title  Pt will improve standing balance and BERG by 8 points to decrease falls risk    Baseline  22/56    Time  8    Period  Weeks    Status  Revised    Target Date  10/13/17      PT LONG TERM GOAL #3   Title  Pt will improve gait velocity to >/= 1.8 ft/sec with appropriate AFO/Bioness and LRAD    Baseline  .99 ft/sec with cane and Bioness    Time  8    Period  Weeks  Status  Revised    Target Date  10/13/17      PT LONG TERM GOAL #4   Title  Pt will participate in AFO assessment if foot slap continues after 8 weeks of training with Bioness    Time  8    Period  Weeks    Status  New    Target Date  10/13/17      PT LONG TERM  GOAL #5   Title  Pt will improve LE strength to perform five time sit <> stand without UE support in </= 15 seconds    Baseline  13 seconds, UE to push up, equal WB through feet, controlled descent    Time  8    Period  Weeks    Status  Revised    Target Date  10/13/17      PT LONG TERM GOAL #6   Title  Pt will ambulate x 300' over indoor and outdoor paved surfaces with cane/appropriate AFO and supervision; will negotiate 12 stairs, 2 rails alternating sequence MOD I    Time  8    Period  Weeks    Status  New    Target Date  10/13/17            Plan - 10/01/17 1035    Clinical Impression Statement  Treatment session continued to focus on NMR, LE strengthening, tone management and gait training with Bioness functional electrical stimulation.  Also incorporated endurance and functional LE training on elliptical with Bioness.  Also began to incorporate infant care activities with balance and gait.  Pt reports a greater sense of stability with Bioness applied to L quad muscle but little carryover currently to daily ambulation; discussed trial of AFO for longer distances and on compliant surfaces to improve knee stability and safety.  Pt is agreeable to trial AFO next session.  Will continue to progress towards LTG.    Rehab Potential  Good    Clinical Impairments Affecting Rehab Potential  will have to have cervical laminectomy soon    PT Frequency  2x / week    PT Duration  8 weeks    PT Treatment/Interventions  ADLs/Self Care Home Management;Cryotherapy;Electrical Stimulation;Moist Heat;DME Instruction;Gait Scientist, forensic;Therapeutic activities;Functional mobility training;Therapeutic exercise;Balance training;Neuromuscular re-education;Patient/family education;Orthotic Fit/Training;Passive range of motion;Taping    PT Next Visit Plan  Needs to add more land visits; aquatic has been set up.  Assess AFO's for L knee stability on compliant surfaces.  Elliptical for endurance. begin  to incorporate infant care activities and gait while carrying items into treatment sessions; Core/back strengthening - prone, tall kneeling, half kneel, quadruped, bridges.  Agility training; Bioness - outside on grass, treadmill, stairs, curb, ramp, obstacle negotiation training with cane; Stretch L ankle into DF; closed chain DF; Continue to address patient concerns re: recovery, spasticity, sexual function, etc.    PT Home Exercise Plan  [id number]    baby daughter is due August 8th - begin to incorporate infant care activities into therapy sessions    Recommended Other Services  aquatic - next week recertify and increase frequency to 3x/week    Consulted and Agree with Plan of Care  Patient       Patient will benefit from skilled therapeutic intervention in order to improve the following deficits and impairments:  Abnormal gait, Decreased balance, Decreased coordination, Decreased strength, Difficulty walking, Increased muscle spasms, Impaired sensation, Pain  Visit Diagnosis: Other symptoms and signs involving the nervous system  Muscle weakness (generalized)  Other  abnormalities of gait and mobility  Unsteadiness  Other disturbances of skin sensation  Paraplegia, incomplete [facility])     Problem List Patient Active Problem List   Diagnosis Date Noted  . Leukocytosis   . Neuropathic pain   . Drug induced constipation   . Urinary retention   . Class 1 obesity due to excess calories with serious comorbidity and body mass index (BMI) of 33.0 to 33.9 in adult   . Myelopathy ([place]) 08/01/2017  . Thoracic spinal stenosis 07/31/2017    Rico Junker, PT, DPT 10/01/17    2:47 PM    [facility] [address] [place], [place], [address] Phone: [phone]   Fax:  [phone]  Name: Scott Lane MRN: [id number] Date of Birth: 1986/01/16

## 2017-10-02 ENCOUNTER — Ambulatory Visit: Payer: 59 | Admitting: Physical Therapy

## 2017-10-02 DIAGNOSIS — R2681 Unsteadiness on feet: Secondary | ICD-10-CM | POA: Diagnosis not present

## 2017-10-02 DIAGNOSIS — R2689 Other abnormalities of gait and mobility: Secondary | ICD-10-CM | POA: Diagnosis not present

## 2017-10-02 DIAGNOSIS — R29818 Other symptoms and signs involving the nervous system: Secondary | ICD-10-CM

## 2017-10-02 DIAGNOSIS — G8222 Paraplegia, incomplete: Secondary | ICD-10-CM | POA: Diagnosis not present

## 2017-10-02 DIAGNOSIS — M6281 Muscle weakness (generalized): Secondary | ICD-10-CM

## 2017-10-02 DIAGNOSIS — R208 Other disturbances of skin sensation: Secondary | ICD-10-CM

## 2017-10-02 NOTE — Therapy (Signed)
[name] [address] [place], [place], [address] Phone: [phone]   Fax:  [phone]  Physical Therapy Treatment  Patient Details  Name: Scott Lane MRN: [id number] Date of Birth: 1985/06/14 Referring Provider: Delice Lesch, MD   Encounter Date: 10/02/2017  PT End of Session - 10/02/17 1919    Visit Number  15    Number of Visits  17    Date for PT Re-Evaluation  10/13/17    Authorization Type  [place] Family Deductible.  VL: MN    PT Start Time  0804    PT Stop Time  0850    PT Time Calculation (min)  46 min    Equipment Utilized During Treatment  Other (comment) AFO    Activity Tolerance  Patient tolerated treatment well    Behavior During Therapy  WFL for tasks assessed/performed       Past Medical History:  Diagnosis Date  . Chicken pox     Past Surgical History:  Procedure Laterality Date  . LUMBAR LAMINECTOMY/DECOMPRESSION MICRODISCECTOMY N/A 08/01/2017   Procedure: Thoracic nine, ten, eleven, twelve laminectomy;  Surgeon: Eustace Moore, MD;  Location: [facility];  Service: Neurosurgery;  Laterality: N/A;  . none      There were no vitals filed for this visit.                    [facility] Adult PT Treatment/Exercise - 10/02/17 1910      Ambulation/Gait   Ambulation/Gait  Yes    Ambulation/Gait Assistance  5: Supervision;4: Min assist    Ambulation/Gait Assistance Details  performed gait assessment with two trial, off the shelf AFO - Posterior leaf spring and ground reaction AFO.  First trial was with PLS first with RW and then with [facility] with pt demonstrating improved L knee hip and knee flexion and foot clearance during swing phase and decreased recurvatum during stance phase.  Due to increased DF pt experienced increased clonus LLE and decreased stability.  Transitioned to ground reaction force AFO and performed gait again with SPC with continued improvements in swing phase but  returned to mild genu recurvatum in stance; mildly decreased clonus noted and pt reported increased stability in L knee.      Ambulation Distance (Feet)  230 Feet    Assistive device  Rolling walker;Straight cane    Gait Pattern  Step-through pattern;Decreased weight shift to left;Left genu recurvatum;Lateral hip instability    Ambulation Surface  Level;Indoor    Gait Comments  Pt to borrow L ground reaction force brace to wear to funeral today due to being on more compliant surface (graveside); discussed dress shoes (more rigid) and recommended that pt trial dress shoes with AFO before wearing to funeral to determine effectiveness and safety.  Pt to return brace on Monday.             PT Education - 10/02/17 1918    Education provided  Yes    Education Details  trial of AFO     Person(s) Educated  Patient;Spouse    Methods  Explanation;Demonstration    Comprehension  Need further instruction       PT Short Term Goals - 09/13/17 0937      PT SHORT TERM GOAL #1   Title  Pt will participate in further assessment of standing balance with BERG    Baseline  22/56    Time  4    Period  Weeks  Status  Achieved      PT SHORT TERM GOAL #2   Title  Pt will participate in initial fitting and training of bilat Bioness    Time  4    Period  Weeks    Status  Achieved      PT SHORT TERM GOAL #3   Title  Pt will improve five time sit <> stand from arm chair to </= 15 seconds with more equal WB through bilat LE and improved control during stand > sit    Time  4    Period  Weeks    Status  Achieved      PT SHORT TERM GOAL #4   Title  Pt will improve gait velocity with RW to >/= 1.8 ft/sec    Baseline  1.6 ft/sec with RW, pt has progressed to cane - gait velocity slower with cane 0.99 ft/sec with cane and Bioness - LTG adjusted    Time  4    Period  Weeks    Status  Not Met      PT SHORT TERM GOAL #5   Title  Pt will negotiate 12 stairs with 2 rails, alternating sequence with  supervision    Time  4    Period  Weeks    Status  Achieved        PT Long Term Goals - 09/13/17 0017      PT LONG TERM GOAL #1   Title  Pt will be independent with balance and LE strengthening HEP    Time  8    Period  Weeks    Status  New    Target Date  10/13/17      PT LONG TERM GOAL #2   Title  Pt will improve standing balance and BERG by 8 points to decrease falls risk    Baseline  22/56    Time  8    Period  Weeks    Status  Revised    Target Date  10/13/17      PT LONG TERM GOAL #3   Title  Pt will improve gait velocity to >/= 1.8 ft/sec with appropriate AFO/Bioness and LRAD    Baseline  .99 ft/sec with cane and Bioness    Time  8    Period  Weeks    Status  Revised    Target Date  10/13/17      PT LONG TERM GOAL #4   Title  Pt will participate in AFO assessment if foot slap continues after 8 weeks of training with Bioness    Time  8    Period  Weeks    Status  New    Target Date  10/13/17      PT LONG TERM GOAL #5   Title  Pt will improve LE strength to perform five time sit <> stand without UE support in </= 15 seconds    Baseline  13 seconds, UE to push up, equal WB through feet, controlled descent    Time  8    Period  Weeks    Status  Revised    Target Date  10/13/17      PT LONG TERM GOAL #6   Title  Pt will ambulate x 300' over indoor and outdoor paved surfaces with cane/appropriate AFO and supervision; will negotiate 12 stairs, 2 rails alternating sequence MOD I    Time  8    Period  Weeks    Status  New  Target Date  10/13/17            Plan - 10/02/17 1933    Clinical Impression Statement  Treatment session focused on assessment of gait efficiency and safety with two types of pre-fabricated AFO: posterior leaf spring and ground reaction AFO.  Discussed benefits of both AFO.  Pt requested to trial ground reaction AFO at grave side funeral today; also recommended pt use at football practice to assess ability to improve stability,  efficiency and safety on more compliant surfaces.  Pt to return AFO next week.      Rehab Potential  Good    Clinical Impairments Affecting Rehab Potential  will have to have cervical laminectomy soon    PT Frequency  2x / week    PT Duration  8 weeks    PT Treatment/Interventions  ADLs/Self Care Home Management;Cryotherapy;Electrical Stimulation;Moist Heat;DME Instruction;Gait Scientist, forensic;Therapeutic activities;Functional mobility training;Therapeutic exercise;Balance training;Neuromuscular re-education;Patient/family education;Orthotic Fit/Training;Passive range of motion;Taping    PT Next Visit Plan  Needs to add more land visits; aquatic has been set up.  HOW did the AFO work for him??  Elliptical for endurance. begin to incorporate infant care activities and gait while carrying items into treatment sessions; Core/back strengthening - prone, tall kneeling, half kneel, quadruped, bridges.  Agility training; Bioness - outside on grass, treadmill, stairs, curb, ramp, obstacle negotiation training with cane; Stretch L ankle into DF; closed chain DF; Continue to address patient concerns re: recovery, spasticity, sexual function, etc.    PT Home Exercise Plan  [id number]    baby daughter is due August 8th - begin to incorporate infant care activities into therapy sessions    Consulted and Agree with Plan of Care  Patient;Family member/caregiver    Family Member Consulted  wife - "T"       Patient will benefit from skilled therapeutic intervention in order to improve the following deficits and impairments:  Abnormal gait, Decreased balance, Decreased coordination, Decreased strength, Difficulty walking, Increased muscle spasms, Impaired sensation, Pain  Visit Diagnosis: Other symptoms and signs involving the nervous system  Muscle weakness (generalized)  Other abnormalities of gait and mobility  Unsteadiness  Other disturbances of skin sensation  Paraplegia, incomplete  ([place])     Problem List Patient Active Problem List   Diagnosis Date Noted  . Leukocytosis   . Neuropathic pain   . Drug induced constipation   . Urinary retention   . Class 1 obesity due to excess calories with serious comorbidity and body mass index (BMI) of 33.0 to 33.9 in adult   . Myelopathy ([place]) 08/01/2017  . Thoracic spinal stenosis 07/31/2017    Rico Junker, PT, DPT 10/02/17    8:00 PM    [facility] [address] [place], [place], [address] Phone: [phone]   Fax:  [phone]  Name: Scott Lane MRN: [id number] Date of Birth: 26-Jan-1986

## 2017-10-07 ENCOUNTER — Ambulatory Visit: Payer: 59 | Attending: Physical Medicine & Rehabilitation | Admitting: Rehabilitative and Restorative Service Providers"

## 2017-10-07 ENCOUNTER — Encounter: Payer: Self-pay | Admitting: Rehabilitative and Restorative Service Providers"

## 2017-10-07 DIAGNOSIS — M6281 Muscle weakness (generalized): Secondary | ICD-10-CM | POA: Insufficient documentation

## 2017-10-07 DIAGNOSIS — G8222 Paraplegia, incomplete: Secondary | ICD-10-CM | POA: Diagnosis not present

## 2017-10-07 DIAGNOSIS — R29818 Other symptoms and signs involving the nervous system: Secondary | ICD-10-CM | POA: Diagnosis not present

## 2017-10-07 DIAGNOSIS — R2689 Other abnormalities of gait and mobility: Secondary | ICD-10-CM | POA: Diagnosis not present

## 2017-10-07 DIAGNOSIS — R208 Other disturbances of skin sensation: Secondary | ICD-10-CM | POA: Diagnosis not present

## 2017-10-07 DIAGNOSIS — R2681 Unsteadiness on feet: Secondary | ICD-10-CM | POA: Insufficient documentation

## 2017-10-07 NOTE — Therapy (Signed)
[facility] [address] [place], [place], [address] Phone: [phone]   Fax:  [phone]  Physical Therapy Treatment  Patient Details  Name: Scott Lane MRN: [id number] Date of Birth: May 22, 1985 Referring Provider: Delice Lesch, MD   Encounter Date: 10/07/2017  PT End of Session - 10/07/17 1148    Visit Number  16    Number of Visits  17    Date for PT Re-Evaluation  10/13/17    Authorization Type  [place] [employer] Deductible.  VL: MN    PT Start Time  0804    PT Stop Time  0850    PT Time Calculation (min)  46 min    Activity Tolerance  Patient tolerated treatment well    Behavior During Therapy  WFL for tasks assessed/performed       Past Medical History:  Diagnosis Date  . Chicken pox     Past Surgical History:  Procedure Laterality Date  . LUMBAR LAMINECTOMY/DECOMPRESSION MICRODISCECTOMY N/A 08/01/2017   Procedure: Thoracic nine, ten, eleven, twelve laminectomy;  Surgeon: Eustace Moore, MD;  Location: [facility];  Service: Neurosurgery;  Laterality: N/A;  . none      There were no vitals filed for this visit.  Subjective Assessment - 10/07/17 0804    Subjective  The patient reports that he increased his baclofen dosage last week.  He did not bring ground reaction AFO back to clinic today.  He noted that it was too uncomfortable to wear.  He reports his right knee is stiff and this is most hindering factor to walking.    Patient Stated Goals  To be more independent at home and improve LE strength    Currently in Pain?  No/denies         [facility] PT Assessment - 10/07/17 0843      Ambulation/Gait   Ambulation/Gait Assistance  5: Supervision;4: Min guard    Ambulation/Gait Assistance Details  PT emphasized left heel "reaching" to get heel strike and also worked on longer step length right for more even stride length.     Ambulation Distance (Feet)  115 Feet x 3 reps    Assistive device  Straight cane     Gait Pattern  Step-through pattern;Decreased weight shift to left;Left genu recurvatum;Lateral hip instability    Ambulation Surface  Level;Indoor                   [facility] Adult PT Treatment/Exercise - 10/07/17 0843      Ambulation/Gait   Ambulation/Gait  Yes      Neuro Re-ed    Neuro Re-ed Details   Rocker board emphasizing knee control and weight shift tot he left side performing step downs anteriorly with return to board.  Sit<>stand working on left weight shift with compliant surface under right foot with min A nad tactile cues to avoid L knee recurvatum.  1/2 sit<>stands for knee control with min A.  Mini squats with weight shift (lift your right heel off the ground, then left heel off the ground while holding squat) x 10 reps and then performing faster/ agility work doing wide leg fast marches for polyometric challenge and cues for L knee.   Backwards walking in parallel bars exaggerating knee flexion.      Exercises   Exercises  Other Exercises    Other Exercises   L knee control/co-contraction strengthening L quad/hamstrings with left side lunge with isometric holds x 5 reps while dec'ing dependence  on UEs for support.  R side lunge with PT tactile cues L knee and hip with isometric holds.    Standing on 2" (3 degree tilt block) with toes supported and heels hovering above the ground with intermittent UE support.  Then held toes over edge of block and worked on ankle dorsiflexion with intermittent UE support.    Ellipitical x 3 minutes forward with tactile cues to decrease L knee recurvatum.  Quadruped R hip hike, then R hip extension x 5 reps with cues on L hip stability and trunk position.  L hip extension in quadriped with CGA and cues to avoid trunk rotation.               PT Short Term Goals - 09/13/17 6945      PT SHORT TERM GOAL #1   Title  Pt will participate in further assessment of standing balance with BERG    Baseline  22/56    Time  4    Period   Weeks    Status  Achieved      PT SHORT TERM GOAL #2   Title  Pt will participate in initial fitting and training of bilat Bioness    Time  4    Period  Weeks    Status  Achieved      PT SHORT TERM GOAL #3   Title  Pt will improve five time sit <> stand from arm chair to </= 15 seconds with more equal WB through bilat LE and improved control during stand > sit    Time  4    Period  Weeks    Status  Achieved      PT SHORT TERM GOAL #4   Title  Pt will improve gait velocity with RW to >/= 1.8 ft/sec    Baseline  1.6 ft/sec with RW, pt has progressed to cane - gait velocity slower with cane 0.99 ft/sec with cane and Bioness - LTG adjusted    Time  4    Period  Weeks    Status  Not Met      PT SHORT TERM GOAL #5   Title  Pt will negotiate 12 stairs with 2 rails, alternating sequence with supervision    Time  4    Period  Weeks    Status  Achieved        PT Long Term Goals - 09/13/17 0388      PT LONG TERM GOAL #1   Title  Pt will be independent with balance and LE strengthening HEP    Time  8    Period  Weeks    Status  New    Target Date  10/13/17      PT LONG TERM GOAL #2   Title  Pt will improve standing balance and BERG by 8 points to decrease falls risk    Baseline  22/56    Time  8    Period  Weeks    Status  Revised    Target Date  10/13/17      PT LONG TERM GOAL #3   Title  Pt will improve gait velocity to >/= 1.8 ft/sec with appropriate AFO/Bioness and LRAD    Baseline  .99 ft/sec with cane and Bioness    Time  8    Period  Weeks    Status  Revised    Target Date  10/13/17      PT LONG TERM GOAL #4  Title  Pt will participate in AFO assessment if foot slap continues after 8 weeks of training with Bioness    Time  8    Period  Weeks    Status  New    Target Date  10/13/17      PT LONG TERM GOAL #5   Title  Pt will improve LE strength to perform five time sit <> stand without UE support in </= 15 seconds    Baseline  13 seconds, UE to push up,  equal WB through feet, controlled descent    Time  8    Period  Weeks    Status  Revised    Target Date  10/13/17      PT LONG TERM GOAL #6   Title  Pt will ambulate x 300' over indoor and outdoor paved surfaces with cane/appropriate AFO and supervision; will negotiate 12 stairs, 2 rails alternating sequence MOD I    Time  8    Period  Weeks    Status  New    Target Date  10/13/17            Plan - 10/07/17 1205    Clinical Impression Statement  Patient to return AFO at next visit and PT to continue to trial off the shelf AFOs.  PT and patient discussed AFO benefit and being able to have a more reciprocal/ faster pace pattern when knee controlled and L foot maintained in dorsiflexion.  PT to continue working to LTGs and anticipate renewal x 8-12 more weeks.    PT Treatment/Interventions  ADLs/Self Care Home Management;Cryotherapy;Electrical Stimulation;Moist Heat;DME Instruction;Gait Scientist, forensic;Therapeutic activities;Functional mobility training;Therapeutic exercise;Balance training;Neuromuscular re-education;Patient/family education;Orthotic Fit/Training;Passive range of motion;Taping    PT Next Visit Plan  *Patient STILL NEEDS to add more land visits; aquatic has been set up. Trial other AFOs (off the shelf to order for home);  Elliptical for endurance. begin to incorporate infant care activities and gait while carrying items into treatment sessions; Core/back strengthening - prone, tall kneeling, half kneel, quadruped, bridges.  Agility training; Bioness - outside on grass, treadmill, stairs, curb, ramp, obstacle negotiation training with cane; Stretch L ankle into DF; closed chain DF; Continue to address patient concerns re: recovery, spasticity, sexual function, etc.    Consulted and Agree with Plan of Care  Patient       Patient will benefit from skilled therapeutic intervention in order to improve the following deficits and impairments:  Abnormal gait, Decreased  balance, Decreased coordination, Decreased strength, Difficulty walking, Increased muscle spasms, Impaired sensation, Pain  Visit Diagnosis: Other symptoms and signs involving the nervous system  Muscle weakness (generalized)  Other abnormalities of gait and mobility  Unsteadiness     Problem List Patient Active Problem List   Diagnosis Date Noted  . Leukocytosis   . Neuropathic pain   . Drug induced constipation   . Urinary retention   . Class 1 obesity due to excess calories with serious comorbidity and body mass index (BMI) of 33.0 to 33.9 in adult   . Myelopathy ([place]) 08/01/2017  . Thoracic spinal stenosis 07/31/2017    Serafin Decatur, PT 10/07/2017, 12:08 PM  [facility] [address] [place], [place], [address] Phone: [phone]   Fax:  [phone]  Name: KHING BELCHER MRN: [id number] Date of Birth: 21-Nov-1985

## 2017-10-09 ENCOUNTER — Other Ambulatory Visit: Payer: Self-pay | Admitting: *Deleted

## 2017-10-09 ENCOUNTER — Ambulatory Visit: Payer: 59 | Admitting: Physical Therapy

## 2017-10-09 ENCOUNTER — Encounter: Payer: Self-pay | Admitting: Physical Therapy

## 2017-10-09 DIAGNOSIS — R2689 Other abnormalities of gait and mobility: Secondary | ICD-10-CM | POA: Diagnosis not present

## 2017-10-09 DIAGNOSIS — R2681 Unsteadiness on feet: Secondary | ICD-10-CM

## 2017-10-09 DIAGNOSIS — R29818 Other symptoms and signs involving the nervous system: Secondary | ICD-10-CM

## 2017-10-09 DIAGNOSIS — M6281 Muscle weakness (generalized): Secondary | ICD-10-CM | POA: Diagnosis not present

## 2017-10-09 DIAGNOSIS — G8222 Paraplegia, incomplete: Secondary | ICD-10-CM | POA: Diagnosis not present

## 2017-10-09 DIAGNOSIS — R208 Other disturbances of skin sensation: Secondary | ICD-10-CM | POA: Diagnosis not present

## 2017-10-09 NOTE — Therapy (Signed)
[facility] [address]  [place], [place], [address] Phone: [phone]   Fax:  [phone]  Physical Therapy Treatment  Patient Details  Name: Scott Lane MRN: [id number] Date of Birth: [birth year] Referring Provider: Delice Lesch, MD   Encounter Date: 10/09/2017  PT End of Session - 10/09/17 1001    Visit Number  17    Number of Visits  17    Date for PT Re-Evaluation  10/13/17    Authorization Type  [place] Family Deductible.  VL: MN    PT Start Time  0806    PT Stop Time  0850    PT Time Calculation (min)  44 min    Activity Tolerance  Patient tolerated treatment well    Behavior During Therapy  [facility] for tasks assessed/performed       Past Medical History:  Diagnosis Date  . Chicken pox     Past Surgical History:  Procedure Laterality Date  . LUMBAR LAMINECTOMY/DECOMPRESSION MICRODISCECTOMY N/A 08/01/2017   Procedure: Thoracic nine, ten, eleven, twelve laminectomy;  Surgeon: Eustace Moore, MD;  Location: [facility];  Service: Neurosurgery;  Laterality: N/A;  . none      There were no vitals filed for this visit.  Subjective Assessment - 10/09/17 0808    Subjective  Did not wear the AFO to the funeral, dress shoes would not accomodate it.  Pt also felt like that leg was longer and it was going to cause him to fall.  Bending L knee better today when ambulating in.    Pertinent History  None - very active    Limitations  Standing;Walking    How long can you stand comfortably?  8-10 minutes    How long can you walk comfortably?  uses cane and RW household distance, RW for community distances    Diagnostic tests  MRI     Patient Stated Goals  To be more independent at home and improve LE strength    Currently in Pain?  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      Leisure  works out regularly; former Psychologist, educational.  Would like to return to driving      Observation/Other Assessments   Focus on Therapeutic Outcomes (FOTO)   not indicated      Ambulation/Gait   Ambulation/Gait  Yes    Ambulation/Gait Assistance  5: Supervision    Ambulation/Gait Assistance Details  during last lap PT provided facilitation for full L lateral and anterior weight shift on LLE stance.  Pt noted to be flexing L knee during terminal stance and swing phase    Ambulation Distance (Feet)  300 Feet    Assistive device  Straight cane    Gait Pattern  Step-through pattern;Decreased weight shift to left;Left genu recurvatum;Left foot flat;Decreased dorsiflexion - left;Decreased stance time - left    Ambulation Surface  Level;Indoor    Stairs  Yes    Stairs Assistance  5: Supervision    Stairs Assistance Details (indicate cue type and reason)  on last set PT provided facilitation at pelvis for full L lateral and anterior weight shift when ascending and descending    Stair Management Technique  Two rails;Alternating pattern;Forwards    Number of Stairs  12    Height of Stairs  6    Gait Comments  Did not perform with AFO or Bioness today but will continue to assess for most appropriate AFO at next visit      Standardized Balance Assessment   Standardized Balance Assessment  Five Times Sit to Stand;10 meter walk test    Five times sit to stand comments   13 seconds with bilat UE; 16 sec without UE but with min A from therapist    10 Meter Walk  25 seconds with cane and no AFO or Bioness today; 1.3 ft/sec      Berg Balance Test   Sit to Stand  Able to stand   independently using hands    Standing Unsupported  Able to stand safely 2 minutes    Sitting with Back Unsupported but Feet Supported on Floor or Stool  Able to sit safely and securely 2 minutes    Stand to Sit  Controls descent by using hands    Transfers  Able to transfer safely, definite need of hands    Standing Unsupported with Eyes Closed  Able to stand 10 seconds with supervision    Standing Ubsupported with Feet Together  Able to place feet together independently and stand for 1 minute with supervision    From Standing, Reach Forward with Outstretched Arm  Can reach confidently >25 cm (10")    From Standing Position, Pick up Object from Floor  Able to pick up shoe, needs supervision    From Standing Position, Turn to Look Behind Over each Shoulder  Looks behind one side only/other side shows less weight shift    Turn 360 Degrees  Needs close supervision or verbal cueing    Standing Unsupported, Alternately Place Feet on Step/Stool  Able to complete 4 steps without aid or supervision    Standing Unsupported, One Foot in Front  Able to take small step independently and hold 30 seconds    Standing on One Leg  Able to lift leg independently and hold equal to or more than 3 seconds    Total Score  40    Berg comment:  40/56                   [facility] Adult PT Treatment/Exercise - 10/09/17 0818      Lumbar Exercises: Seated   Sit to Stand  5 reps;10 reps 5 reps single leg, 10 bilat             PT Education - 10/09/17 0951    Education provided  Yes    Education Details  progress towards goals; recertify for 3x/week - 2 land based and 1 aquatic; added to HEP - areas to continue to focus on    Person(s) Educated  Patient    Methods  Explanation;Demonstration    Comprehension  Verbalized understanding;Returned demonstration       PT Short Term Goals - 09/13/17 0937      PT SHORT TERM GOAL #1   Title  Pt will participate in further assessment of standing balance with  BERG    Baseline  22/56    Time  4    Period  Weeks    Status  Achieved      PT SHORT TERM GOAL #2   Title  Pt will participate in initial fitting and training of bilat Bioness    Time  4    Period  Weeks    Status  Achieved      PT SHORT TERM GOAL #3   Title  Pt will improve five time sit <> stand from arm chair to </= 15 seconds with more equal WB through bilat LE and improved control during stand > sit    Time  4    Period  Weeks    Status  Achieved      PT SHORT TERM GOAL #4   Title  Pt will improve gait velocity with RW to >/= 1.8 ft/sec    Baseline  1.6 ft/sec with RW, pt has progressed to cane - gait velocity slower with cane 0.99 ft/sec with cane and Bioness - LTG adjusted    Time  4    Period  Weeks    Status  Not Met      PT SHORT TERM GOAL #5   Title  Pt will negotiate 12 stairs with 2 rails, alternating sequence with supervision    Time  4    Period  Weeks    Status  Achieved        PT Long Term Goals - 10/09/17 0813      PT LONG TERM GOAL #1   Title  Pt will be independent with balance and LE strengthening HEP    Time  8    Period  Weeks    Status  Achieved      PT LONG TERM GOAL #2   Title  Pt will improve standing balance and BERG by 8 points to decrease falls risk    Baseline  40/56    Time  8    Period  Weeks    Status  Achieved      PT LONG TERM GOAL #3   Title  Pt will improve gait velocity to >/= 1.8 ft/sec with appropriate AFO/Bioness and LRAD    Baseline  1.3 ft/sec - improved but not to goal level    Time  8    Period  Weeks    Status  Partially Met      PT LONG TERM GOAL #4   Title  Pt will participate in AFO assessment if foot slap continues after 8 weeks of training with Bioness    Baseline  assessed PLS and ground reaction AFO - will need to continue to assess    Time  8    Period  Weeks    Status  On-going      PT LONG TERM GOAL #5   Title  Pt will improve LE strength to perform five time sit <> stand without UE support in  </= 15 seconds    Baseline  13 seconds with bilat UE, 16 seconds without UE - min A from therapist - need to work on single limb sit > stand    Time  8    Period  Weeks    Status  Partially Met      PT LONG TERM GOAL #6   Title  Pt will ambulate x 300' over indoor and outdoor paved surfaces with cane/appropriate AFO and supervision; will negotiate 12 stairs, 2 rails alternating sequence MOD I  Baseline  S for 300' indoors, S for 12 stairs.  Did not assess outdoors today but pt currently using RW on grassy terrain    Time  8    Period  Weeks    Status  Partially Met       NEW GOALS: PT Short Term Goals - 10/09/17 1344      PT SHORT TERM GOAL #1   Title  Pt will improve standing balance as indicated by increased BERG balance score to >/= 44/56    Baseline  40/56    Time  5    Period  Weeks    Status  Revised    Target Date  11/19/17      PT SHORT TERM GOAL #2   Title  Pt will participate in initial fitting and training of AFO for indoor/outdoor surfaces with cane    Time  5    Period  Weeks    Status  Revised    Target Date  11/19/17      PT SHORT TERM GOAL #3   Title  Pt will improve five time sit <> stand from arm chair to </= 15 seconds with supervision without use of UE to demonstrate increased functional LE strength    Baseline  16 seconds without UE and min A from therapist    Time  5    Period  Weeks    Status  Revised    Target Date  11/19/17      PT SHORT TERM GOAL #4   Title  Pt will improve gait velocity with RW to >/= 1.8 ft/sec with cane and AFO    Baseline  1.3 with cane, no AFO    Time  5    Period  Weeks    Status  Revised    Target Date  11/19/17      PT SHORT TERM GOAL #5   Title  Pt will demonstrate ability to ambulate safely carry baby carrier over indoor surfaces with cane and supervision and up/down stairs with one rail    Time  5    Period  Weeks    Status  New    Target Date  11/19/17         PT Long Term Goals - 10/09/17 1727       PT LONG TERM GOAL #1   Title  Pt will be independent with balance and LE strengthening HEP    Time  10    Period  Weeks    Status  Revised    Target Date  12/26/17      PT LONG TERM GOAL #2   Title  Pt will improve standing balance and BERG >/= 50/56 to decrease falls risk    Time  10    Period  Weeks    Status  Revised    Target Date  12/26/17      PT LONG TERM GOAL #3   Title  Pt will improve gait velocity to >/= 2.5 ft/sec with appropriate AFO/Bioness and LRAD or no AD    Time  10    Period  Weeks    Status  Revised    Target Date  12/26/17      PT LONG TERM GOAL #4   Title  Pt will ambulate x 1000 over outdoor surfaces (paved and grassy) MOD I with most appropriate AFO and LRAD or no AD; will negotiate 12 stairs, one rails, alternating sequence MOD I  Time  10    Period  Weeks    Status  Revised    Target Date  12/26/17      PT LONG TERM GOAL #5   Title  Pt will demonstrate ability to transition floor <> stand MOD I and perform infant care activities (carry, load carrier, push stroller, in the floor to play or change diaper, etc) with AFO and LRAD/or no AD    Time  10    Period  Weeks    Status  New    Target Date  12/26/17      PT LONG TERM GOAL #6   Title  Pt will report being able to stand on sidelines at football practice and game with AFO and LRAD/or no AD with intermittent seated rest breaks and move away from players quickly without LOB    Time  10    Period  Weeks    Status  New    Target Date  12/26/17          Plan - 10/09/17 1005    Clinical Impression Statement  Performed assessment of progress towards LTG; pt is making steady progress towards goals and has met 2/6 LTG, partially meeting 3 goals and AFO goal ongoing.  Pt demonstrates significant progress with standing balance and good compliance with HEP.  Pt demonstrates improvement in LE strength and has progressed sit > stand training to no UE, and incorporating single leg sit > stand.  Pt's gait  speed continues to improve with cane but continues to be slowed placing pt at increased risk for reoccuring falls and pt continues to use RW on uneven outdoor surfaces.  Will continue to assess pt for most appropriate AFO to improve foot clearance, gait safety and efficiency.  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      Rehab Potential  Good    PT Frequency  3x / week    PT Duration  Other (comment) 10 weeks    PT Treatment/Interventions  ADLs/Self Care Home Management;Cryotherapy;Electrical Stimulation;Moist Heat;DME Instruction;Gait Scientist, forensic;Therapeutic activities;Functional mobility training;Therapeutic exercise;Balance training;Neuromuscular re-education;Patient/family education;Orthotic Fit/Training;Passive range of motion;Taping;Aquatic Therapy    PT Next Visit Plan  Trial other AFOs (off the shelf to order for home);  Elliptical for endurance. begin to incorporate infant care activities and gait while carrying items into treatment sessions; Core/back strengthening - prone, tall kneeling, half kneel, quadruped, bridges.  Agility training; Bioness - outside on grass, treadmill, stairs, curb, ramp, obstacle negotiation training with cane; Stretch L ankle into DF; closed chain DF; Continue to address patient concerns re: recovery, spasticity, sexual function, etc.    PT Home Exercise Plan  [id number]    baby daughter is due August 8th - begin to incorporate infant care activities into therapy sessions    Consulted and Agree with Plan of Care  Patient       Patient will benefit from skilled therapeutic intervention in order to improve the following deficits and impairments:  Abnormal gait, Decreased balance, Decreased coordination, Decreased strength, Difficulty walking, Increased muscle spasms, Impaired sensation,  Pain, Impaired tone, Decreased range of motion  Visit Diagnosis: Other symptoms and signs involving the nervous system  Muscle weakness (generalized)  Other abnormalities of gait and mobility  Unsteadiness  Other disturbances of skin sensation  Paraplegia, incomplete ([place])     Problem List  Patient Active Problem List   Diagnosis Date Noted  . Leukocytosis   . Neuropathic pain   . Drug induced constipation   . Urinary retention   . Class 1 obesity due to excess calories with serious comorbidity and body mass index (BMI) of 33.0 to 33.9 in adult   . Myelopathy ([place]) 08/01/2017  . Thoracic spinal stenosis 07/31/2017    Rico Junker, PT, DPT 10/09/17    1:38 PM    [facility] [address] [place], [place], [address] Phone: [phone]   Fax:  [phone]  Name: Scott Lane MRN: [id number] Date of Birth: 1985-07-07

## 2017-10-09 NOTE — Patient Instructions (Signed)
Access Code: [email or web address]  URL: [email or web address]  Date: 10/09/2017  Prepared by: Bufford LopeAudra Anwyn Kriegel   Added the following exercises to HEP:  Sit to Stand without Arm Support - 10 reps - 2 sets - 1x daily - 7x weekly  Single Leg Sit to Stand with Arms Extended - 10 reps - 2 sets - 1x daily - 7x weekly

## 2017-10-09 NOTE — Telephone Encounter (Signed)
error 

## 2017-10-14 ENCOUNTER — Ambulatory Visit: Payer: 59 | Admitting: Physical Therapy

## 2017-10-14 ENCOUNTER — Encounter: Payer: Self-pay | Admitting: Physical Therapy

## 2017-10-14 DIAGNOSIS — M6281 Muscle weakness (generalized): Secondary | ICD-10-CM

## 2017-10-14 DIAGNOSIS — G8222 Paraplegia, incomplete: Secondary | ICD-10-CM | POA: Diagnosis not present

## 2017-10-14 DIAGNOSIS — R2689 Other abnormalities of gait and mobility: Secondary | ICD-10-CM

## 2017-10-14 DIAGNOSIS — R208 Other disturbances of skin sensation: Secondary | ICD-10-CM

## 2017-10-14 DIAGNOSIS — R2681 Unsteadiness on feet: Secondary | ICD-10-CM

## 2017-10-14 DIAGNOSIS — R29818 Other symptoms and signs involving the nervous system: Secondary | ICD-10-CM | POA: Diagnosis not present

## 2017-10-15 NOTE — Therapy (Signed)
[facility] [address] [place], [place], [address] Phone: [phone]   Fax:  [phone]  Physical Therapy Treatment  Patient Details  Name: Scott Lane MRN: [id number] Date of Birth: [birth year] Referring Provider: Maryla Morrow, MD   Encounter Date: 10/14/2017  PT End of Session - 10/14/17 1516    Visit Number  18    Number of Visits  47    Date for PT Re-Evaluation  12/26/17    Authorization Type  [employer] Deductible.  VL: MN    PT Start Time  0810    PT Stop Time  0852    PT Time Calculation (min)  42 min    Equipment Utilized During Treatment  Other (comment) trial AFO    Activity Tolerance  Patient tolerated treatment well    Behavior During Therapy  WFL for tasks assessed/performed       Past Medical History:  Diagnosis Date  . Chicken pox     Past Surgical History:  Procedure Laterality Date  . LUMBAR LAMINECTOMY/DECOMPRESSION MICRODISCECTOMY N/A 08/01/2017   Procedure: Thoracic nine, ten, eleven, twelve laminectomy;  Surgeon: Tia Alert, MD;  Location: [facility] OR;  Service: Neurosurgery;  Laterality: N/A;  . none      There were no vitals filed for this visit.  Subjective Assessment - 10/14/17 0813    Subjective  Was able to try the single leg sit to stand at home and was able to do 7-8 repetitions in a row.      Pertinent History  None - very active    Limitations  Standing;Walking    How long can you stand comfortably?  8-10 minutes    How long can you walk comfortably?  uses cane and RW household distance, RW for community distances    Diagnostic tests  MRI     Patient Stated Goals  To be more independent at home and improve LE strength    Currently in Pain?  No/denies                       [facility] Adult PT Treatment/Exercise - 10/15/17 1507      Ambulation/Gait   Ambulation/Gait  Yes    Ambulation/Gait Assistance  5: Supervision;4: Min assist    Ambulation/Gait  Assistance Details  continued gait assessment with trial AFO: first with different brand of ground reaction AFO to continue to provide pt with DF and knee stability and then again with posterior leaf spring (Spry Step and then [place] due to Spry Step causing increased foot whip and supination).  Performed gait indoors, up/down stairs and then outdoors over paved sidewalk, parking lot black top, curb and gravel with SPC.  Pt continues to demonstrate improved foot clearance and knee flexion activation in terminal stance > swing with AFO (PLS and ground reaction AFO) but pt reports restriction in movement and weight shift with ground reaction AFO and requested to trial PLS at home.      Ambulation Distance (Feet)  500 Feet    Assistive device  Straight cane    Gait Pattern  Step-through pattern    Ambulation Surface  Level;Unlevel;Indoor;Outdoor;Paved;Gravel    Stairs  Yes    Stairs Assistance  5: Supervision    Stairs Assistance Details (indicate cue type and reason)  with trial AFO    Stair Management Technique  Two rails;Alternating pattern;Forwards    Number of Stairs  4    Height of Stairs  6    Curb  4: Min assist    Curb Details (indicate cue type and reason)  with cane and trial AFO             PT Education - 10/15/17 1515    Education provided  Yes    Education Details  continued to assess differences between AFO; pt to trial PLS at home.  aquatic therapy on Wednesday    Person(s) Educated  Patient    Methods  Explanation    Comprehension  Verbalized understanding       PT Short Term Goals - 10/09/17 1344      PT SHORT TERM GOAL #1   Title  Pt will improve standing balance as indicated by increased BERG balance score to >/= 44/56    Baseline  40/56    Time  5    Period  Weeks    Status  Revised    Target Date  11/19/17      PT SHORT TERM GOAL #2   Title  Pt will participate in initial fitting and training of AFO for indoor/outdoor surfaces with cane    Time  5     Period  Weeks    Status  Revised    Target Date  11/19/17      PT SHORT TERM GOAL #3   Title  Pt will improve five time sit <> stand from arm chair to </= 15 seconds with supervision without use of UE to demonstrate increased functional LE strength    Baseline  16 seconds without UE and min A from therapist    Time  5    Period  Weeks    Status  Revised    Target Date  11/19/17      PT SHORT TERM GOAL #4   Title  Pt will improve gait velocity with RW to >/= 1.8 ft/sec with cane and AFO    Baseline  1.3 with cane, no AFO    Time  5    Period  Weeks    Status  Revised    Target Date  11/19/17      PT SHORT TERM GOAL #5   Title  Pt will demonstrate ability to ambulate safely carry baby carrier over indoor surfaces with cane and supervision and up/down stairs with one rail    Time  5    Period  Weeks    Status  New    Target Date  11/19/17        PT Long Term Goals - 10/09/17 1727      PT LONG TERM GOAL #1   Title  Pt will be independent with balance and LE strengthening HEP    Time  10    Period  Weeks    Status  Revised    Target Date  12/26/17      PT LONG TERM GOAL #2   Title  Pt will improve standing balance and BERG >/= 50/56 to decrease falls risk    Time  10    Period  Weeks    Status  Revised    Target Date  12/26/17      PT LONG TERM GOAL #3   Title  Pt will improve gait velocity to >/= 2.5 ft/sec with appropriate AFO/Bioness and LRAD or no AD    Time  10    Period  Weeks    Status  Revised    Target Date  12/26/17  PT LONG TERM GOAL #4   Title  Pt will ambulate x 1000 over outdoor surfaces (paved and grassy) MOD I with most appropriate AFO and LRAD or no AD; will negotiate 12 stairs, one rails, alternating sequence MOD I    Time  10    Period  Weeks    Status  Revised    Target Date  12/26/17      PT LONG TERM GOAL #5   Title  Pt will demonstrate ability to transition floor <> stand MOD I and perform infant care activities (carry, load carrier,  push stroller, in the floor to play or change diaper, etc) with AFO and LRAD/or no AD    Time  10    Period  Weeks    Status  New    Target Date  12/26/17      PT LONG TERM GOAL #6   Title  Pt will report being able to stand on sidelines at football practice and game with AFO and LRAD/or no AD with intermittent seated rest breaks and move away from players quickly without LOB    Time  10    Period  Weeks    Status  New    Target Date  12/26/17            Plan - 10/15/17 1517    Clinical Impression Statement  Continued gait training and assessment with two varieties of AFO over indoor and outdoor surfaces to determine effectiveness, safety and pt willingness to utilize.  Pt to take PLS home to trial at MD appointment with cane and in the home.  Will continue to assess and address in order to maximize safety and independence with mobility.    Rehab Potential  Good    PT Frequency  3x / week    PT Duration  Other (comment) 10 weeks    PT Treatment/Interventions  ADLs/Self Care Home Management;Cryotherapy;Electrical Stimulation;Moist Heat;DME Instruction;Gait Network engineertraining;Stair training;Therapeutic activities;Functional mobility training;Therapeutic exercise;Balance training;Neuromuscular re-education;Patient/family education;Orthotic Fit/Training;Passive range of motion;Taping;Aquatic Therapy    PT Next Visit Plan  How did trial with AFO go? Elliptical for endurance. begin to incorporate infant care activities and gait while carrying items into treatment sessions; Core/back strengthening - prone, tall kneeling, half kneel, quadruped, bridges.  Agility training; Bioness - outside on grass, treadmill, stairs, curb, ramp, obstacle negotiation training with cane; Stretch L ankle into DF; closed chain DF; Continue to address patient concerns re: recovery, spasticity, sexual function, etc.    PT Home Exercise Plan  [id number]    baby daughter is due August 8th - begin to incorporate infant care  activities into therapy sessions    Consulted and Agree with Plan of Care  Patient       Patient will benefit from skilled therapeutic intervention in order to improve the following deficits and impairments:  Abnormal gait, Decreased balance, Decreased coordination, Decreased strength, Difficulty walking, Increased muscle spasms, Impaired sensation, Pain, Impaired tone, Decreased range of motion  Visit Diagnosis: Other symptoms and signs involving the nervous system  Muscle weakness (generalized)  Other abnormalities of gait and mobility  Unsteadiness  Other disturbances of skin sensation  Paraplegia, incomplete (HCC)     Problem List Patient Active Problem List   Diagnosis Date Noted  . Leukocytosis   . Neuropathic pain   . Drug induced constipation   . Urinary retention   . Class 1 obesity due to excess calories with serious comorbidity and body mass index (BMI) of 33.0 to 33.9 in  adult   . Myelopathy (HCC) 08/01/2017  . Thoracic spinal stenosis 07/31/2017   Dierdre Highman, PT, DPT 10/15/17    3:22 PM    [facility] [address] [place], [place], [address] Phone: [phone]   Fax:  [phone]  Name: Scott Lane MRN: [id number] Date of Birth: 17-Feb-1986

## 2017-10-16 ENCOUNTER — Ambulatory Visit: Payer: 59 | Admitting: Physical Therapy

## 2017-10-16 ENCOUNTER — Encounter: Payer: Self-pay | Admitting: Physical Therapy

## 2017-10-16 DIAGNOSIS — M6281 Muscle weakness (generalized): Secondary | ICD-10-CM | POA: Diagnosis not present

## 2017-10-16 DIAGNOSIS — R2681 Unsteadiness on feet: Secondary | ICD-10-CM

## 2017-10-16 DIAGNOSIS — R2689 Other abnormalities of gait and mobility: Secondary | ICD-10-CM | POA: Diagnosis not present

## 2017-10-16 DIAGNOSIS — G8222 Paraplegia, incomplete: Secondary | ICD-10-CM | POA: Diagnosis not present

## 2017-10-16 DIAGNOSIS — R29818 Other symptoms and signs involving the nervous system: Secondary | ICD-10-CM | POA: Diagnosis not present

## 2017-10-16 DIAGNOSIS — R208 Other disturbances of skin sensation: Secondary | ICD-10-CM | POA: Diagnosis not present

## 2017-10-16 NOTE — Therapy (Signed)
[facility] [address] [place], [place], [address] Phone: [phone]   Fax:  [phone]  Physical Therapy Treatment  Patient Details  Name: Scott Lane MRN: [id number] Date of Birth: 08-Nov-1985 Referring Provider: Maryla MorrowAnkit Patel, MD   Encounter Date: 10/16/2017  PT End of Session - 10/16/17 1813    Visit Number  19    Number of Visits  47    Date for PT Re-Evaluation  12/26/17    Authorization Type  [employer] Deductible.  VL: MN    PT Start Time  1514    PT Stop Time  1602    PT Time Calculation (min)  48 min    Equipment Utilized During Treatment  -- utilized pool noodles, hand buoys for aquatic therapy    Activity Tolerance  Patient tolerated treatment well       Past Medical History:  Diagnosis Date  . Chicken pox     Past Surgical History:  Procedure Laterality Date  . LUMBAR LAMINECTOMY/DECOMPRESSION MICRODISCECTOMY N/A 08/01/2017   Procedure: Thoracic nine, ten, eleven, twelve laminectomy;  Surgeon: Tia AlertJones, David S, MD;  [facility];  Service: Neurosurgery;  Laterality: N/A;  . none      There were no vitals filed for this visit.  Subjective Assessment - 10/16/17 1801    Subjective  Pt [facility] pool area [facility] but has brought his RW, stating "I knew I would need this when I'm done"; Ready to participate in aquatic therapy today    Patient Stated Goals  To be more independent at home and improve LE strength    Currently in Pain?  No/denies                   Adult Aquatic Therapy - 10/16/17 1806      Aquatic Therapy Subjective   Subjective  Pt amb. from pool deck area to steps to enter pool with Lt mod. hand held assist;  pt entered pool by steps with use of bilateral hand rails      Treatment   Gait  Pt amb. in 3' - 5' depth of water approx. 25' x 4 reps for warm up using buoyancy of water for support     Exercises  Pt performed Lt hamstring stretch  (runner's stretch ) x 60 sec hold x 1 reps for LLE stretching; Lt heel cord stretch 60 sec hold 1 rep ;  Pt performed standing Lt hip flexion, extension and abduction with Lt foot dorsiflexed  10 reps each using viscosity of water for resistance.     Balance  Pt performed Lt SLS with UE support prn on side of pool 10 secs x 2 reps       Pt performed jumping x 10 reps with bil. UE support ; progressed to unilateral hopping with UE support 10 reps on Each leg; jogging in place 10 reps each leg for high level balance training Pt performed crossovers front 25' without UE support x 2 reps; stepping behind with RLE behind LLE and then LLE behind RLE x 2 reps  In 5' depth of water; buoyancy of water used for support to prevent LOB/falling  Pt pushed pool noodle down into water with LLE using hydrostatic pressure on pool floor to hold for isometric contraction - 10 reps Stood on noodle with LLE - stepped up/back and laterally with RLE for LLE stabilization with cue to maintain Lt knee flexed for Lt knee  control and to  Prevent hyperextension  Pt performed bilateral heel raises x 10 reps; LLE unilateral heel raise x 10 reps with bil. UE support  Pt performed Lt knee flexion with Lt foot dorsiflexed x 10 reps; 10 reps with increased speed to increase resistance with Lt hip blocked against pool wall to  Prevent substitution  Pt reclined back with assist of PT to maintain supine position - noodle behind back and under arms - pt performed bil hip abduction with feet dorsiflexed 10 reps Performed bil LE hip abduction/adduction with legs together - moving pt against current of water for increased resistance   Pt requires the buoyancy and viscosity of the water for support due to balance deficits and for resistance for strength training for LLE             PT Short Term Goals - 10/09/17 1344      PT SHORT TERM GOAL #1   Title  Pt will improve standing balance as indicated by increased BERG  balance score to >/= 44/56    Baseline  40/56    Time  5    Period  Weeks    Status  Revised    Target Date  11/19/17      PT SHORT TERM GOAL #2   Title  Pt will participate in initial fitting and training of AFO for indoor/outdoor surfaces with cane    Time  5    Period  Weeks    Status  Revised    Target Date  11/19/17      PT SHORT TERM GOAL #3   Title  Pt will improve five time sit <> stand from arm chair to </= 15 seconds with supervision without use of UE to demonstrate increased functional LE strength    Baseline  16 seconds without UE and min A from therapist    Time  5    Period  Weeks    Status  Revised    Target Date  11/19/17      PT SHORT TERM GOAL #4   Title  Pt will improve gait velocity with RW to >/= 1.8 ft/sec with cane and AFO    Baseline  1.3 with cane, no AFO    Time  5    Period  Weeks    Status  Revised    Target Date  11/19/17      PT SHORT TERM GOAL #5   Title  Pt will demonstrate ability to ambulate safely carry baby carrier over indoor surfaces with cane and supervision and up/down stairs with one rail    Time  5    Period  Weeks    Status  New    Target Date  11/19/17        PT Long Term Goals - 10/09/17 1727      PT LONG TERM GOAL #1   Title  Pt will be independent with balance and LE strengthening HEP    Time  10    Period  Weeks    Status  Revised    Target Date  12/26/17      PT LONG TERM GOAL #2   Title  Pt will improve standing balance and BERG >/= 50/56 to decrease falls risk    Time  10    Period  Weeks    Status  Revised    Target Date  12/26/17      PT LONG TERM GOAL #3   Title  Pt will improve gait velocity to >/= 2.5 ft/sec with appropriate AFO/Bioness and LRAD or no AD    Time  10    Period  Weeks    Status  Revised    Target Date  12/26/17      PT LONG TERM GOAL #4   Title  Pt will ambulate x 1000 over outdoor surfaces (paved and grassy) MOD I with most appropriate AFO and LRAD or no AD; will negotiate 12  stairs, one rails, alternating sequence MOD I    Time  10    Period  Weeks    Status  Revised    Target Date  12/26/17      PT LONG TERM GOAL #5   Title  Pt will demonstrate ability to transition floor <> stand MOD I and perform infant care activities (carry, load carrier, push stroller, in the floor to play or change diaper, etc) with AFO and LRAD/or no AD    Time  10    Period  Weeks    Status  New    Target Date  12/26/17      PT LONG TERM GOAL #6   Title  Pt will report being able to stand on sidelines at football practice and game with AFO and LRAD/or no AD with intermittent seated rest breaks and move away from players quickly without LOB    Time  10    Period  Weeks    Status  New    Target Date  12/26/17            Plan - 10/16/17 1814    Clinical Impression Statement  Pt's LLE noted to have tremors/clonus in pool due to fatigue and exertion with challenging activities; pt's RLE noted to have tremors at end of session.  Pt had difficulty performing quick reciprocal movements with LLE due to increased tone/spasticity and muscle weakness.  Pt 's balance much improved at end of session iwth pt able to attempt jogging approximately 30' across pool in 5' depth of water without UE support.      Rehab Potential  Good    Clinical Impairments Affecting Rehab Potential  will have to have cervical laminectomy soon    PT Frequency  3x / week    PT Treatment/Interventions  ADLs/Self Care Home Management;Cryotherapy;Electrical Stimulation;Moist Heat;DME Instruction;Gait Network engineer;Therapeutic activities;Functional mobility training;Therapeutic exercise;Balance training;Neuromuscular re-education;Patient/family education;Orthotic Fit/Training;Passive range of motion;Taping;Aquatic Therapy    PT Next Visit Plan  How did trial with AFO go? Elliptical for endurance. begin to incorporate infant care activities and gait while carrying items into treatment sessions; Core/back  strengthening - prone, tall kneeling, half kneel, quadruped, bridges.  Agility training; Bioness - outside on grass, treadmill, stairs, curb, ramp, obstacle negotiation training with cane; Stretch L ankle into DF; closed chain DF; Continue to address patient concerns re: recovery, spasticity, sexual function, etc.    Consulted and Agree with Plan of Care  Patient       Patient will benefit from skilled therapeutic intervention in order to improve the following deficits and impairments:  Abnormal gait, Decreased balance, Decreased coordination, Decreased strength, Difficulty walking, Increased muscle spasms, Impaired sensation, Pain, Impaired tone, Decreased range of motion  Visit Diagnosis: Other abnormalities of gait and mobility  Unsteadiness  Muscle weakness (generalized)     Problem List Patient Active Problem List   Diagnosis Date Noted  . Leukocytosis   . Neuropathic pain   . Drug induced constipation   . Urinary retention   .  Class 1 obesity due to excess calories with serious comorbidity and body mass index (BMI) of 33.0 to 33.9 in adult   . Myelopathy (HCC) 08/01/2017  . Thoracic spinal stenosis 07/31/2017    [id number], IONGE [id number], PT 10/16/2017, 6:23 PM   [facility] [address] [place], [place], [address] Phone: [phone]   Fax:  [phone]  Name: Scott Lane MRN: [id number] Date of Birth: 06/29/1985

## 2017-10-18 ENCOUNTER — Ambulatory Visit: Payer: 59 | Admitting: Physical Therapy

## 2017-10-18 ENCOUNTER — Encounter: Payer: Self-pay | Admitting: Physical Therapy

## 2017-10-18 DIAGNOSIS — R2681 Unsteadiness on feet: Secondary | ICD-10-CM | POA: Diagnosis not present

## 2017-10-18 DIAGNOSIS — R29818 Other symptoms and signs involving the nervous system: Secondary | ICD-10-CM

## 2017-10-18 DIAGNOSIS — R208 Other disturbances of skin sensation: Secondary | ICD-10-CM | POA: Diagnosis not present

## 2017-10-18 DIAGNOSIS — G8222 Paraplegia, incomplete: Secondary | ICD-10-CM | POA: Diagnosis not present

## 2017-10-18 DIAGNOSIS — M6281 Muscle weakness (generalized): Secondary | ICD-10-CM

## 2017-10-18 DIAGNOSIS — R2689 Other abnormalities of gait and mobility: Secondary | ICD-10-CM | POA: Diagnosis not present

## 2017-10-18 NOTE — Therapy (Signed)
[facility] [address] [place], [place], [address] Phone: [phone]   Fax:  [phone]  Physical Therapy Treatment  Patient Details  Name: Scott Lane MRN: [id number] Date of Birth: 11-03-1985 Referring Provider: Maryla Morrow, MD   Encounter Date: 10/18/2017  PT End of Session - 10/18/17 1003    Visit Number  20    Number of Visits  47    Date for PT Re-Evaluation  12/26/17    Authorization Type  [place] [employer] Deductible.  VL: MN    PT Start Time  0850    PT Stop Time  0935    PT Time Calculation (min)  45 min    Equipment Utilized During Treatment  Other (comment) Bioness    Activity Tolerance  Patient tolerated treatment well    Behavior During Therapy  WFL for tasks assessed/performed       Past Medical History:  Diagnosis Date  . Chicken pox     Past Surgical History:  Procedure Laterality Date  . LUMBAR LAMINECTOMY/DECOMPRESSION MICRODISCECTOMY N/A 08/01/2017   Procedure: Thoracic nine, ten, eleven, twelve laminectomy;  Surgeon: Tia Alert, MD;  [facility] OR;  Service: Neurosurgery;  Laterality: N/A;  . none      There were no vitals filed for this visit.  Subjective Assessment - 10/18/17 0951    Subjective  Pt very stiff today but doesn't think it was from aquatic; felt that aquatic really helped.  Followed up with neurosurgeon who referred pt to neurology at [facility] for botox of LLE.  Did not feel the PLS was comfortable or helpful.    Pertinent History  None - very active    Limitations  Standing;Walking    How long can you stand comfortably?  8-10 minutes    How long can you walk comfortably?  uses cane and RW household distance, RW for community distances    Diagnostic tests  MRI     Patient Stated Goals  To be more independent at home and improve LE strength    Currently in Pain?  No/denies                       [facility] Adult PT Treatment/Exercise - 10/18/17 0956       Ambulation/Gait   Ambulation/Gait  Yes    Ambulation/Gait Assistance  4: Min guard    Ambulation/Gait Assistance Details  first in // bars with floor ladder to facilitate increased step length and stance time forwards and retro x 4 laps each without UE support but with Bioness in gait mode; transitioned to gait around gym without AD with Bioness in gait mode with continued focus on increased weight shift and stance time LLE and increased use of hip flexion and hamstring activation for LLE swing and stance phase control    Ambulation Distance (Feet)  230 Feet    Assistive device  None    Gait Pattern  Step-through pattern;Decreased step length - right;Decreased stance time - left;Decreased hip/knee flexion - left;Decreased dorsiflexion - left;Decreased weight shift to left;Left genu recurvatum    Ambulation Surface  Level;Indoor      Knee/Hip Exercises: Aerobic   Tread Mill  with Bioness in gait mode forwards x 8 minutes at 1.0 mph at max speed and 2 minutes backwards at 0.5 mph max speed.  Focus of gait training on treadmill on hamstring activation, eccentric control during swing phase and increasing hip flexion during swing phase; also  focused on increased stance time on LLE.  Pt tends to sling L tibia forwards during swing with genu recurvatum during L stance.  Improved knee control observed when pt cued to increase hip flexion and control knee extension and tibia progression during swing      Modalities   Modalities  Electrical Stimulation      Electrical Stimulation   Electrical Stimulation Location  bilat anterior tibialis, L hamstring mm    Electrical Stimulation Action  closed and open chain ankle DF, L hip extension and knee flexion    Electrical Stimulation Parameters  See saved parameters in tablet 2; steering electrodes bilat    Electrical Stimulation Goals  Strength;Neuromuscular facilitation             PT Education - 10/18/17 1008    Education provided  Yes     Education Details  what to expect with Botox injection    Person(s) Educated  Patient    Methods  Explanation    Comprehension  Verbalized understanding       PT Short Term Goals - 10/09/17 1344      PT SHORT TERM GOAL #1   Title  Pt will improve standing balance as indicated by increased BERG balance score to >/= 44/56    Baseline  40/56    Time  5    Period  Weeks    Status  Revised    Target Date  11/19/17      PT SHORT TERM GOAL #2   Title  Pt will participate in initial fitting and training of AFO for indoor/outdoor surfaces with cane    Time  5    Period  Weeks    Status  Revised    Target Date  11/19/17      PT SHORT TERM GOAL #3   Title  Pt will improve five time sit <> stand from arm chair to </= 15 seconds with supervision without use of UE to demonstrate increased functional LE strength    Baseline  16 seconds without UE and min A from therapist    Time  5    Period  Weeks    Status  Revised    Target Date  11/19/17      PT SHORT TERM GOAL #4   Title  Pt will improve gait velocity with RW to >/= 1.8 ft/sec with cane and AFO    Baseline  1.3 with cane, no AFO    Time  5    Period  Weeks    Status  Revised    Target Date  11/19/17      PT SHORT TERM GOAL #5   Title  Pt will demonstrate ability to ambulate safely carry baby carrier over indoor surfaces with cane and supervision and up/down stairs with one rail    Time  5    Period  Weeks    Status  New    Target Date  11/19/17        PT Long Term Goals - 10/09/17 1727      PT LONG TERM GOAL #1   Title  Pt will be independent with balance and LE strengthening HEP    Time  10    Period  Weeks    Status  Revised    Target Date  12/26/17      PT LONG TERM GOAL #2   Title  Pt will improve standing balance and BERG >/= 50/56 to decrease falls risk  Time  10    Period  Weeks    Status  Revised    Target Date  12/26/17      PT LONG TERM GOAL #3   Title  Pt will improve gait velocity to >/= 2.5  ft/sec with appropriate AFO/Bioness and LRAD or no AD    Time  10    Period  Weeks    Status  Revised    Target Date  12/26/17      PT LONG TERM GOAL #4   Title  Pt will ambulate x 1000 over outdoor surfaces (paved and grassy) MOD I with most appropriate AFO and LRAD or no AD; will negotiate 12 stairs, one rails, alternating sequence MOD I    Time  10    Period  Weeks    Status  Revised    Target Date  12/26/17      PT LONG TERM GOAL #5   Title  Pt will demonstrate ability to transition floor <> stand MOD I and perform infant care activities (carry, load carrier, push stroller, in the floor to play or change diaper, etc) with AFO and LRAD/or no AD    Time  10    Period  Weeks    Status  New    Target Date  12/26/17      PT LONG TERM GOAL #6   Title  Pt will report being able to stand on sidelines at football practice and game with AFO and LRAD/or no AD with intermittent seated rest breaks and move away from players quickly without LOB    Time  10    Period  Weeks    Status  New    Target Date  12/26/17            Plan - 10/18/17 1009    Clinical Impression Statement  Returned to gait training with Bioness in gait mode first on treadmill to focus on sequencing and increasing gait speed and then transitioning to overground focusing on decreasing use of UE and AD.  Pt demonstrates improved trunk/postural control but continues to require verbal and tactile cues for full stance time LLE and sequencing of hip and knee flexion during swing phase.  Will continue to address to continue to progress towards LTG.    Rehab Potential  Good    Clinical Impairments Affecting Rehab Potential  will have to have cervical laminectomy soon    PT Frequency  3x / week    PT Treatment/Interventions  ADLs/Self Care Home Management;Cryotherapy;Electrical Stimulation;Moist Heat;DME Instruction;Gait Network engineertraining;Stair training;Therapeutic activities;Functional mobility training;Therapeutic exercise;Balance  training;Neuromuscular re-education;Patient/family education;Orthotic Fit/Training;Passive range of motion;Taping;Aquatic Therapy    PT Next Visit Plan  Bioness - treadmill, gait without AD; with AD outside on grass, stairs, curb, ramp, obstacle negotiation; begin to incorporate infant care activities and gait while carrying items into treatment sessions; Core/back strengthening - prone, tall kneeling, half kneel, quadruped, bridges.  Agility training    PT Home Exercise Plan  [id number]    baby daughter is due August 8th - begin to incorporate infant care activities into therapy sessions    Recommended Other Services  Pt may be getting Botox injection in future; let him know may need to hold PT for 48 hours to let medication take effect.    Consulted and Agree with Plan of Care  Patient       Patient will benefit from skilled therapeutic intervention in order to improve the following deficits and impairments:  Abnormal gait, Decreased  balance, Decreased coordination, Decreased strength, Difficulty walking, Increased muscle spasms, Impaired sensation, Pain, Impaired tone, Decreased range of motion  Visit Diagnosis: Other abnormalities of gait and mobility  Unsteadiness  Muscle weakness (generalized)  Other disturbances of skin sensation  Other symptoms and signs involving the nervous system  Paraplegia, incomplete [facility])     Problem List Patient Active Problem List   Diagnosis Date Noted  . Leukocytosis   . Neuropathic pain   . Drug induced constipation   . Urinary retention   . Class 1 obesity due to excess calories with serious comorbidity and body mass index (BMI) of 33.0 to 33.9 in adult   . Myelopathy (HCC) 08/01/2017  . Thoracic spinal stenosis 07/31/2017    Dierdre Highman, PT, DPT 10/18/17    10:14 AM     [facility] [address] [place], [place], [address] Phone: [phone]   Fax:  [phone]  Name:  Scott Lane MRN: [id number] Date of Birth: 03-13-1986

## 2017-10-21 ENCOUNTER — Encounter: Payer: Self-pay | Admitting: Physical Therapy

## 2017-10-21 ENCOUNTER — Ambulatory Visit: Payer: 59 | Admitting: Physical Therapy

## 2017-10-21 DIAGNOSIS — R208 Other disturbances of skin sensation: Secondary | ICD-10-CM | POA: Diagnosis not present

## 2017-10-21 DIAGNOSIS — M6281 Muscle weakness (generalized): Secondary | ICD-10-CM | POA: Diagnosis not present

## 2017-10-21 DIAGNOSIS — R2689 Other abnormalities of gait and mobility: Secondary | ICD-10-CM | POA: Diagnosis not present

## 2017-10-21 DIAGNOSIS — G8222 Paraplegia, incomplete: Secondary | ICD-10-CM | POA: Diagnosis not present

## 2017-10-21 DIAGNOSIS — R29818 Other symptoms and signs involving the nervous system: Secondary | ICD-10-CM | POA: Diagnosis not present

## 2017-10-21 DIAGNOSIS — R2681 Unsteadiness on feet: Secondary | ICD-10-CM | POA: Diagnosis not present

## 2017-10-21 NOTE — Therapy (Signed)
[facility] [address] [place], [place], [address] Phone: [phone]   Fax:  [phone]  Physical Therapy Treatment  Patient Details  Name: Scott Lane MRN: [id number] Date of Birth: May 03, 1985 Referring Provider: Maryla Morrow, MD   Encounter Date: 10/21/2017  PT End of Session - 10/21/17 0900    Visit Number  21    Number of Visits  47    Date for PT Re-Evaluation  12/26/17    Authorization Type  [place] [employer] Deductible.  VL: MN    PT Start Time  0804    PT Stop Time  0845    PT Time Calculation (min)  41 min    Equipment Utilized During Treatment  Gait belt    Activity Tolerance  Patient tolerated treatment well LLE fatigued at end of session    Behavior During Therapy  [facility] for tasks assessed/performed       Past Medical History:  Diagnosis Date  . Chicken pox     Past Surgical History:  Procedure Laterality Date  . LUMBAR LAMINECTOMY/DECOMPRESSION MICRODISCECTOMY N/A 08/01/2017   Procedure: Thoracic nine, ten, eleven, twelve laminectomy;  Surgeon: Tia Alert, MD;  Location: [facility] OR;  Service: Neurosurgery;  Laterality: N/A;  . none      There were no vitals filed for this visit.  Subjective Assessment - 10/21/17 0805    Subjective  Stiff in my L knee today.  My Botox is scheduled for early September, but they have me on the waiting list.    Pertinent History  None - very active    Limitations  Standing;Walking    How long can you stand comfortably?  8-10 minutes    How long can you walk comfortably?  uses cane and RW household distance, RW for community distances    Diagnostic tests  MRI     Patient Stated Goals  To be more independent at home and improve LE strength    Currently in Pain?  No/denies                       [facility] Adult PT Treatment/Exercise - 10/21/17 0834      Transfers   Comments  Attempted tall kneel to half kneel to stand from red mat on floor;  however, pt pushes through UEs to come off both knees, then pivots around to sit on mat, with minimal assistance.      Ambulation/Gait   Ambulation/Gait  Yes    Ambulation/Gait Assistance  4: Min guard    Ambulation Distance (Feet)  400 Feet    Assistive device  Straight cane    Gait Pattern  Step-through pattern;Decreased step length - right;Decreased stance time - left;Decreased hip/knee flexion - left;Decreased dorsiflexion - left;Decreased weight shift to left;Left genu recurvatum    Ambulation Surface  Outdoor;Unlevel;Paved;Gravel    Curb  4: Min assist    Curb Details (indicate cue type and reason)  with cane, x 2 reps    Gait Comments  Gait training on outdoor surfaces including sidewalk and gravel, no Bioness utilized this visit; with incline/decline, cues for foot clearance and placement.  Negotiated stepping over obstacles outdoors x 4 reps with cane and min assist.      Exercises   Exercises  Other Exercises    Other Exercises   In tall kneeling on floor, UE supported at mat:  tall kneel to sit back on heels x 5 reps (pt c/o tightness  behind L knee), with cues upon return to tall kneel for glut activation and abdominal activation; in tall kneeling, abdominal activation with alternating UE support x 10 reps each UE, then tall kneeling with side step to R and L, x 2 reps      Knee/Hip Exercises: Stretches   Gastroc Stretch  Right;Left;2 reps;30 seconds    Gastroc Stretch Limitations  Runner's stretch at outside of parallel bars    Other Knee/Hip Stretches  Standing bilateral hamstring stretches at outside of parallel bars-with flexed hips, posterior lean, 3 x 15 seconds      Knee/Hip Exercises: Aerobic   Elliptical  Level 1, 2:30 minutes forward, 30 seconds back with assist in backwards direction; significant UE support, as warm-up prior to outdoor gait      Knee/Hip Exercises: Standing   Functional Squat  1 set;10 reps               PT Short Term Goals - 10/09/17 1344       PT SHORT TERM GOAL #1   Title  Pt will improve standing balance as indicated by increased BERG balance score to >/= 44/56    Baseline  40/56    Time  5    Period  Weeks    Status  Revised    Target Date  11/19/17      PT SHORT TERM GOAL #2   Title  Pt will participate in initial fitting and training of AFO for indoor/outdoor surfaces with cane    Time  5    Period  Weeks    Status  Revised    Target Date  11/19/17      PT SHORT TERM GOAL #3   Title  Pt will improve five time sit <> stand from arm chair to </= 15 seconds with supervision without use of UE to demonstrate increased functional LE strength    Baseline  16 seconds without UE and min A from therapist    Time  5    Period  Weeks    Status  Revised    Target Date  11/19/17      PT SHORT TERM GOAL #4   Title  Pt will improve gait velocity with RW to >/= 1.8 ft/sec with cane and AFO    Baseline  1.3 with cane, no AFO    Time  5    Period  Weeks    Status  Revised    Target Date  11/19/17      PT SHORT TERM GOAL #5   Title  Pt will demonstrate ability to ambulate safely carry baby carrier over indoor surfaces with cane and supervision and up/down stairs with one rail    Time  5    Period  Weeks    Status  New    Target Date  11/19/17        PT Long Term Goals - 10/09/17 1727      PT LONG TERM GOAL #1   Title  Pt will be independent with balance and LE strengthening HEP    Time  10    Period  Weeks    Status  Revised    Target Date  12/26/17      PT LONG TERM GOAL #2   Title  Pt will improve standing balance and BERG >/= 50/56 to decrease falls risk    Time  10    Period  Weeks    Status  Revised  Target Date  12/26/17      PT LONG TERM GOAL #3   Title  Pt will improve gait velocity to >/= 2.5 ft/sec with appropriate AFO/Bioness and LRAD or no AD    Time  10    Period  Weeks    Status  Revised    Target Date  12/26/17      PT LONG TERM GOAL #4   Title  Pt will ambulate x 1000 over outdoor  surfaces (paved and grassy) MOD I with most appropriate AFO and LRAD or no AD; will negotiate 12 stairs, one rails, alternating sequence MOD I    Time  10    Period  Weeks    Status  Revised    Target Date  12/26/17      PT LONG TERM GOAL #5   Title  Pt will demonstrate ability to transition floor <> stand MOD I and perform infant care activities (carry, load carrier, push stroller, in the floor to play or change diaper, etc) with AFO and LRAD/or no AD    Time  10    Period  Weeks    Status  New    Target Date  12/26/17      PT LONG TERM GOAL #6   Title  Pt will report being able to stand on sidelines at football practice and game with AFO and LRAD/or no AD with intermittent seated rest breaks and move away from players quickly without LOB    Time  10    Period  Weeks    Status  New    Target Date  12/26/17            Plan - 10/21/17 0901    Clinical Impression Statement  Today's skilled PT session focused on warm-up/stretching and weightbearing through bilateral lower extremities to address reported stiffness at beginning of session, then transitioned to gait training outdoor surfaces with cane (no Bioness today) on varied surfaces and obstacle negotation.  Finished session with tall kneeling exercises for hip and trunk control, with pt fatigued at end of session, needing full UE support and min assist to return to sit at mat from floor.  Pt will conitnue to beneift from skilled PT to address trunk/lower extremity control and gait training.    Rehab Potential  Good    Clinical Impairments Affecting Rehab Potential  will have to have cervical laminectomy soon    PT Frequency  3x / week    PT Treatment/Interventions  ADLs/Self Care Home Management;Cryotherapy;Electrical Stimulation;Moist Heat;DME Instruction;Gait Network engineer;Therapeutic activities;Functional mobility training;Therapeutic exercise;Balance training;Neuromuscular re-education;Patient/family education;Orthotic  Fit/Training;Passive range of motion;Taping;Aquatic Therapy    PT Next Visit Plan  Bioness - treadmill, gait without AD; with AD outside on grass, stairs, curb, ramp, obstacle negotiation; begin to incorporate infant care activities and gait while carrying items into treatment sessions; Core/back strengthening - prone, tall kneeling, half kneel, quadruped, bridges.  Agility training    PT Home Exercise Plan  [id number]    baby daughter is due August 8th - begin to incorporate infant care activities into therapy sessions    Consulted and Agree with Plan of Care  Patient       Patient will benefit from skilled therapeutic intervention in order to improve the following deficits and impairments:  Abnormal gait, Decreased balance, Decreased coordination, Decreased strength, Difficulty walking, Increased muscle spasms, Impaired sensation, Pain, Impaired tone, Decreased range of motion  Visit Diagnosis: Other abnormalities of gait and mobility  Muscle weakness (generalized)  Unsteadiness     Problem List Patient Active Problem List   Diagnosis Date Noted  . Leukocytosis   . Neuropathic pain   . Drug induced constipation   . Urinary retention   . Class 1 obesity due to excess calories with serious comorbidity and body mass index (BMI) of 33.0 to 33.9 in adult   . Myelopathy (HCC) 08/01/2017  . Thoracic spinal stenosis 07/31/2017    Jaydenn Boccio W. 10/21/2017, 1:22 PM  Gean Maidens., PT  [facility] [address] [place], [place], [address] Phone: [phone]   Fax:  [phone]  Name: Scott Lane MRN: [id number] Date of Birth: 05-Jan-1986

## 2017-10-23 ENCOUNTER — Encounter: Payer: Self-pay | Admitting: Physical Therapy

## 2017-10-23 ENCOUNTER — Ambulatory Visit: Payer: 59 | Admitting: Physical Therapy

## 2017-10-23 DIAGNOSIS — M4714 Other spondylosis with myelopathy, thoracic region: Secondary | ICD-10-CM | POA: Diagnosis not present

## 2017-10-23 DIAGNOSIS — M5124 Other intervertebral disc displacement, thoracic region: Secondary | ICD-10-CM | POA: Diagnosis not present

## 2017-10-23 DIAGNOSIS — M6281 Muscle weakness (generalized): Secondary | ICD-10-CM

## 2017-10-23 DIAGNOSIS — R208 Other disturbances of skin sensation: Secondary | ICD-10-CM | POA: Diagnosis not present

## 2017-10-23 DIAGNOSIS — R29818 Other symptoms and signs involving the nervous system: Secondary | ICD-10-CM | POA: Diagnosis not present

## 2017-10-23 DIAGNOSIS — R2689 Other abnormalities of gait and mobility: Secondary | ICD-10-CM | POA: Diagnosis not present

## 2017-10-23 DIAGNOSIS — R2681 Unsteadiness on feet: Secondary | ICD-10-CM | POA: Diagnosis not present

## 2017-10-23 DIAGNOSIS — G8222 Paraplegia, incomplete: Secondary | ICD-10-CM | POA: Diagnosis not present

## 2017-10-23 NOTE — Therapy (Signed)
[facility] [address] [place], [place], [address] Phone: [phone]   Fax:  [phone]  Physical Therapy Treatment  Patient Details  Name: Scott Lane MRN: [id number] Date of Birth: 06-Mar-1986 Referring Provider: Maryla Morrow, MD   Encounter Date: 10/23/2017  PT End of Session - 10/23/17 1906    Visit Number  22    Number of Visits  47    Date for PT Re-Evaluation  12/26/17    Authorization Type  [employer]ible.  VL: MN    PT Start Time  1515    PT Stop Time  1602    PT Time Calculation (min)  47 min    Equipment Utilized During Treatment  -- flotation belt used for some activities       Past Medical History:  Diagnosis Date  . Chicken pox     Past Surgical History:  Procedure Laterality Date  . LUMBAR LAMINECTOMY/DECOMPRESSION MICRODISCECTOMY N/A 08/01/2017   Procedure: Thoracic nine, ten, eleven, twelve laminectomy;  Surgeon: Tia Alert, MD;  Location: [facility] OR;  Service: Neurosurgery;  Laterality: N/A;  . none      There were no vitals filed for this visit.  Subjective Assessment - 10/23/17 1900    Subjective  Pt states he had increased tone/spasticity in Lt leg for one day after aquatic therapy last week     Pertinent History  None - very active    Diagnostic tests  MRI     Patient Stated Goals  To be more independent at home and improve LE strength    Currently in Pain?  No/denies        Aquatic therapy; pool temp 86 degrees; pt entered and exited pool by step negotiation using step by step sequence; pt amb. From bleachers to pool entry with use of   SPC and min hand held assist on Lt side  Pt performed Lt and Rt knee flexion 10 reps each in standing with thigh blocked against pool wall to prevent substitution - 10 reps slow at first, then 10 reps quickly for increased resistance of water (each leg)  Pt performed forwards, backwards and sideways walking across pool - 30' x 2  reps each direction Jumping 10 reps using viscosity of water for support; LLE mini squats x 10 reps with UE support  Crossovers front and back 30' across pool - with mod hand held assist;   Pt reclined back with use of noodle for support - performed LE abduction - then with scissoring in adduction with alternating positions for improved coordination Jogging 30' across pool with UE support prn for balance recovery   Ankle sways 10 reps for ankle strengthening and for balance with postural sway  Pt requires aquatic therapy to perform higher level balance activities without risk of fall - such as jumping, jogging etc which would be very difficult to perform on land  Also pt requires the viscosity for support and resistance for strengthening exercises and the buoyancy to assist with offloading his body to increase LLE AROM                        PT Short Term Goals - 10/09/17 1344      PT SHORT TERM GOAL #1   Title  Pt will improve standing balance as indicated by increased BERG balance score to >/= 44/56    Baseline  40/56    Time  5  Period  Weeks    Status  Revised    Target Date  11/19/17      PT SHORT TERM GOAL #2   Title  Pt will participate in initial fitting and training of AFO for indoor/outdoor surfaces with cane    Time  5    Period  Weeks    Status  Revised    Target Date  11/19/17      PT SHORT TERM GOAL #3   Title  Pt will improve five time sit <> stand from arm chair to </= 15 seconds with supervision without use of UE to demonstrate increased functional LE strength    Baseline  16 seconds without UE and min A from therapist    Time  5    Period  Weeks    Status  Revised    Target Date  11/19/17      PT SHORT TERM GOAL #4   Title  Pt will improve gait velocity with RW to >/= 1.8 ft/sec with cane and AFO    Baseline  1.3 with cane, no AFO    Time  5    Period  Weeks    Status  Revised    Target Date  11/19/17      PT SHORT TERM GOAL #5    Title  Pt will demonstrate ability to ambulate safely carry baby carrier over indoor surfaces with cane and supervision and up/down stairs with one rail    Time  5    Period  Weeks    Status  New    Target Date  11/19/17        PT Long Term Goals - 10/09/17 1727      PT LONG TERM GOAL #1   Title  Pt will be independent with balance and LE strengthening HEP    Time  10    Period  Weeks    Status  Revised    Target Date  12/26/17      PT LONG TERM GOAL #2   Title  Pt will improve standing balance and BERG >/= 50/56 to decrease falls risk    Time  10    Period  Weeks    Status  Revised    Target Date  12/26/17      PT LONG TERM GOAL #3   Title  Pt will improve gait velocity to >/= 2.5 ft/sec with appropriate AFO/Bioness and LRAD or no AD    Time  10    Period  Weeks    Status  Revised    Target Date  12/26/17      PT LONG TERM GOAL #4   Title  Pt will ambulate x 1000 over outdoor surfaces (paved and grassy) MOD I with most appropriate AFO and LRAD or no AD; will negotiate 12 stairs, one rails, alternating sequence MOD I    Time  10    Period  Weeks    Status  Revised    Target Date  12/26/17      PT LONG TERM GOAL #5   Title  Pt will demonstrate ability to transition floor <> stand MOD I and perform infant care activities (carry, load carrier, push stroller, in the floor to play or change diaper, etc) with AFO and LRAD/or no AD    Time  10    Period  Weeks    Status  New    Target Date  12/26/17      PT  LONG TERM GOAL #6   Title  Pt will report being able to stand on sidelines at football practice and game with AFO and LRAD/or no AD with intermittent seated rest breaks and move away from players quickly without LOB    Time  10    Period  Weeks    Status  New    Target Date  12/26/17            Plan - 10/23/17 1907    Clinical Impression Statement  Pt has difficulty moving LLE due to spasticity; LLE was trembling with exertional activities; pt able to  perform SLS activities in water with occasional min assist for balance recovery - pt stated he did not feel that his balance in the water was as good today as it was at last week's session; pt able to swim 30' x 4 reps across pool with use of flotation belt and noodle with decreased AROM in kicking LLE due to spasticity      PT Frequency  3x / week    PT Duration  Other (comment)    PT Treatment/Interventions  ADLs/Self Care Home Management;Cryotherapy;Electrical Stimulation;Moist Heat;DME Instruction;Gait Network engineer;Therapeutic activities;Functional mobility training;Therapeutic exercise;Balance training;Neuromuscular re-education;Patient/family education;Orthotic Fit/Training;Passive range of motion;Taping;Aquatic Therapy    PT Next Visit Plan  Bioness - treadmill, gait without AD; with AD outside on grass, stairs, curb, ramp, obstacle negotiation; begin to incorporate infant care activities and gait while carrying items into treatment sessions; Core/back strengthening - prone, tall kneeling, half kneel, quadruped, bridges.  Agility training    Consulted and Agree with Plan of Care  Patient       Patient will benefit from skilled therapeutic intervention in order to improve the following deficits and impairments:  Abnormal gait, Decreased balance, Decreased coordination, Decreased strength, Difficulty walking, Increased muscle spasms, Impaired sensation, Pain, Impaired tone, Decreased range of motion  Visit Diagnosis: Other abnormalities of gait and mobility  Muscle weakness (generalized)  Other symptoms and signs involving the nervous system     Problem List Patient Active Problem List   Diagnosis Date Noted  . Leukocytosis   . Neuropathic pain   . Drug induced constipation   . Urinary retention   . Class 1 obesity due to excess calories with serious comorbidity and body mass index (BMI) of 33.0 to 33.9 in adult   . Myelopathy (HCC) 08/01/2017  . Thoracic spinal stenosis  07/31/2017    Kary Kos, PT 10/23/2017, 7:16 PM  [facility] [address] [place], [place], [address] Phone: [phone]   Fax:  [phone]  Name: Scott Lane MRN: [id number] Date of Birth: 10/30/1985

## 2017-10-25 ENCOUNTER — Ambulatory Visit: Payer: 59 | Admitting: Rehabilitation

## 2017-10-25 ENCOUNTER — Encounter: Payer: Self-pay | Admitting: Rehabilitation

## 2017-10-25 DIAGNOSIS — R29818 Other symptoms and signs involving the nervous system: Secondary | ICD-10-CM | POA: Diagnosis not present

## 2017-10-25 DIAGNOSIS — M6281 Muscle weakness (generalized): Secondary | ICD-10-CM | POA: Diagnosis not present

## 2017-10-25 DIAGNOSIS — G8222 Paraplegia, incomplete: Secondary | ICD-10-CM | POA: Diagnosis not present

## 2017-10-25 DIAGNOSIS — R208 Other disturbances of skin sensation: Secondary | ICD-10-CM | POA: Diagnosis not present

## 2017-10-25 DIAGNOSIS — R2681 Unsteadiness on feet: Secondary | ICD-10-CM | POA: Diagnosis not present

## 2017-10-25 DIAGNOSIS — R2689 Other abnormalities of gait and mobility: Secondary | ICD-10-CM

## 2017-10-25 NOTE — Therapy (Signed)
[facility] [address] [place], [place], [address] Phone: [phone]   Fax:  [phone]  Physical Therapy Treatment  Patient Details  Name: Scott Lane MRN: [id number] Date of Birth: 21-Dec-1985 Referring Provider: Maryla Morrow, MD   Encounter Date: 10/25/2017  PT End of Session - 10/25/17 0850    Visit Number  23    Number of Visits  47    Date for PT Re-Evaluation  12/26/17    Authorization Type  [employer] Deductible.  VL: MN    PT Start Time  0847    PT Stop Time  0930    PT Time Calculation (min)  43 min    Equipment Utilized During Treatment  -- flotation belt used for some activities    Activity Tolerance  Patient tolerated treatment well    Behavior During Therapy  WFL for tasks assessed/performed       Past Medical History:  Diagnosis Date  . Chicken pox     Past Surgical History:  Procedure Laterality Date  . LUMBAR LAMINECTOMY/DECOMPRESSION MICRODISCECTOMY N/A 08/01/2017   Procedure: Thoracic nine, ten, eleven, twelve laminectomy;  Surgeon: Tia Alert, MD;  Location: [facility] OR;  Service: Neurosurgery;  Laterality: N/A;  . none      There were no vitals filed for this visit.  Subjective Assessment - 10/25/17 0849    Subjective  Pt reports improved tone in LLE today, better following this last aquatic session.     Pertinent History  None - very active    Limitations  Standing;Walking    How long can you stand comfortably?  8-10 minutes    How long can you walk comfortably?  uses cane and RW household distance, RW for community distances    Diagnostic tests  MRI     Patient Stated Goals  To be more independent at home and improve LE strength    Currently in Pain?  No/denies                       [facility] Adult PT Treatment/Exercise - 10/25/17 0900      Ambulation/Gait   Ambulation/Gait  Yes    Ambulation/Gait Assistance  4: Min guard    Ambulation/Gait Assistance  Details  Gait with SPC during session following NMR tasks to assess carryover for decrease tone in LLE.  Note more fluid movement in LLE during swing phase of gait with improved knee and hip flexion, however still has difficulty fully loading LLE during stance.  Provided light facilitation at L pelvis for improved weight shift and WB during stance.     Ambulation Distance (Feet)  200 Feet    Assistive device  Straight cane    Gait Pattern  Step-through pattern;Decreased step length - right;Decreased stance time - left;Decreased hip/knee flexion - left;Decreased dorsiflexion - left;Decreased weight shift to left;Left genu recurvatum    Ambulation Surface  Level;Indoor      Neuro Re-ed    Neuro Re-ed Details   Stretching to decrease tone in LLE; L knee to chest x 10 reps with 1 2 min hold, educated to do at home.  Also performed child's pose x 2 mins and then child's pose to the L with 30 sec hold.  Progressed to L half kneeling maintaining balance with and without UE support, elevating slightly using L LE x 5 reps, R half kneeling transitioning forward onto LLE for increased hip and knee flexion x 5 reps  with 5 sec hold.  Pt with difficulty transitioning into half kneeling from tall kneeling needing BUE support and assist for LE placement.  Cues for maintaining upright posture and improved L hip activation for ease of transition.  Continued with gait without AD (following gait with AD mentioned above) at min A level with cues for improved arm swing, facilitation for improved L lateral weight shift, cues for landing LLE in line with shoulder as he tends to abduct LLE away from body to avoid full WB.  However did note continued improvement in fluidity of LLE during swing.  Transitioned to // bars performing forward lunges x 3 reps (4 sets) with BUE support-cues for technique and decreasing use of UE support.  Ended session on elliptical for improved fluidity of movement x 2 mins with BUE support without  resistance.  Pt tolerated well, but was very fatigued following session, still able to use cane to leave clinic but had RW on opposite side.  Discussed pt using elliptical or seated bike for decreasing tone at home/gym.  Pt verbalized understanding.               PT Education - 10/25/17 0850    Education provided  Yes    Education Details  stretches to decrease tone    Person(s) Educated  Patient    Methods  Explanation    Comprehension  Verbalized understanding       PT Short Term Goals - 10/09/17 1344      PT SHORT TERM GOAL #1   Title  Pt will improve standing balance as indicated by increased BERG balance score to >/= 44/56    Baseline  40/56    Time  5    Period  Weeks    Status  Revised    Target Date  11/19/17      PT SHORT TERM GOAL #2   Title  Pt will participate in initial fitting and training of AFO for indoor/outdoor surfaces with cane    Time  5    Period  Weeks    Status  Revised    Target Date  11/19/17      PT SHORT TERM GOAL #3   Title  Pt will improve five time sit <> stand from arm chair to </= 15 seconds with supervision without use of UE to demonstrate increased functional LE strength    Baseline  16 seconds without UE and min A from therapist    Time  5    Period  Weeks    Status  Revised    Target Date  11/19/17      PT SHORT TERM GOAL #4   Title  Pt will improve gait velocity with RW to >/= 1.8 ft/sec with cane and AFO    Baseline  1.3 with cane, no AFO    Time  5    Period  Weeks    Status  Revised    Target Date  11/19/17      PT SHORT TERM GOAL #5   Title  Pt will demonstrate ability to ambulate safely carry baby carrier over indoor surfaces with cane and supervision and up/down stairs with one rail    Time  5    Period  Weeks    Status  New    Target Date  11/19/17        PT Long Term Goals - 10/09/17 1727      PT LONG TERM GOAL #1   Title  Pt will be independent with balance and LE strengthening HEP    Time  10    Period   Weeks    Status  Revised    Target Date  12/26/17      PT LONG TERM GOAL #2   Title  Pt will improve standing balance and BERG >/= 50/56 to decrease falls risk    Time  10    Period  Weeks    Status  Revised    Target Date  12/26/17      PT LONG TERM GOAL #3   Title  Pt will improve gait velocity to >/= 2.5 ft/sec with appropriate AFO/Bioness and LRAD or no AD    Time  10    Period  Weeks    Status  Revised    Target Date  12/26/17      PT LONG TERM GOAL #4   Title  Pt will ambulate x 1000 over outdoor surfaces (paved and grassy) MOD I with most appropriate AFO and LRAD or no AD; will negotiate 12 stairs, one rails, alternating sequence MOD I    Time  10    Period  Weeks    Status  Revised    Target Date  12/26/17      PT LONG TERM GOAL #5   Title  Pt will demonstrate ability to transition floor <> stand MOD I and perform infant care activities (carry, load carrier, push stroller, in the floor to play or change diaper, etc) with AFO and LRAD/or no AD    Time  10    Period  Weeks    Status  New    Target Date  12/26/17      PT LONG TERM GOAL #6   Title  Pt will report being able to stand on sidelines at football practice and game with AFO and LRAD/or no AD with intermittent seated rest breaks and move away from players quickly without LOB    Time  10    Period  Weeks    Status  New    Target Date  12/26/17            Plan - 10/25/17 1250    Clinical Impression Statement  Pt continues to have difficulty with increased spasticity in LLE during session, however with flexion based NMR exercises, note improved fluidity of movement in LLE.  Continue to educate on benefits of botox as he reports he was able to get in Aug 1st rather than Sept.  Pt verbalized understanding.     Clinical Impairments Affecting Rehab Potential  will have to have cervical laminectomy soon    PT Frequency  3x / week    PT Duration  Other (comment)    PT Treatment/Interventions  ADLs/Self Care Home  Management;Cryotherapy;Electrical Stimulation;Moist Heat;DME Instruction;Gait Network engineertraining;Stair training;Therapeutic activities;Functional mobility training;Therapeutic exercise;Balance training;Neuromuscular re-education;Patient/family education;Orthotic Fit/Training;Passive range of motion;Taping;Aquatic Therapy    PT Next Visit Plan  Bioness - treadmill, gait without AD; with AD outside on grass, stairs, curb, ramp, obstacle negotiation; begin to incorporate infant care activities and gait while carrying items into treatment sessions; Core/back strengthening - prone, tall kneeling, half kneel, quadruped, bridges.  Agility training    PT Home Exercise Plan  [id number]    baby daughter is due August 8th - begin to incorporate infant care activities into therapy sessions    Consulted and Agree with Plan of Care  Patient       Patient will benefit from skilled therapeutic  intervention in order to improve the following deficits and impairments:  Abnormal gait, Decreased balance, Decreased coordination, Decreased strength, Difficulty walking, Increased muscle spasms, Impaired sensation, Pain, Impaired tone, Decreased range of motion  Visit Diagnosis: Other abnormalities of gait and mobility  Muscle weakness (generalized)  Other symptoms and signs involving the nervous system  Unsteadiness     Problem List Patient Active Problem List   Diagnosis Date Noted  . Leukocytosis   . Neuropathic pain   . Drug induced constipation   . Urinary retention   . Class 1 obesity due to excess calories with serious comorbidity and body mass index (BMI) of 33.0 to 33.9 in adult   . Myelopathy (HCC) 08/01/2017  . Thoracic spinal stenosis 07/31/2017   Harriet Butte, PT, MPT [facility] [address] [place], [place], [address] Phone: [phone]   Fax:  [phone] 10/25/17, 12:53 PM  Name: Scott Lane MRN: [id number] Date of Birth: 1986-01-03

## 2017-10-30 ENCOUNTER — Ambulatory Visit: Payer: 59 | Admitting: Physical Therapy

## 2017-10-30 DIAGNOSIS — R29818 Other symptoms and signs involving the nervous system: Secondary | ICD-10-CM | POA: Diagnosis not present

## 2017-10-30 DIAGNOSIS — M6281 Muscle weakness (generalized): Secondary | ICD-10-CM | POA: Diagnosis not present

## 2017-10-30 DIAGNOSIS — R2689 Other abnormalities of gait and mobility: Secondary | ICD-10-CM

## 2017-10-30 DIAGNOSIS — R208 Other disturbances of skin sensation: Secondary | ICD-10-CM | POA: Diagnosis not present

## 2017-10-30 DIAGNOSIS — G8222 Paraplegia, incomplete: Secondary | ICD-10-CM | POA: Diagnosis not present

## 2017-10-30 DIAGNOSIS — R2681 Unsteadiness on feet: Secondary | ICD-10-CM

## 2017-10-31 ENCOUNTER — Encounter: Payer: Self-pay | Admitting: Physical Therapy

## 2017-10-31 ENCOUNTER — Encounter: Payer: 59 | Admitting: Physical Medicine & Rehabilitation

## 2017-10-31 NOTE — Therapy (Signed)
[facility] [address] [place], [place], [address] Phone: [phone]   Fax:  [phone]  Physical Therapy Treatment  Patient Details  Name: Scott Lane MRN: [id number] Date of Birth: Jun 26, 1985 Referring Provider: Maryla Morrow, MD   Encounter Date: 10/30/2017  PT End of Session - 10/31/17 0824    Visit Number  24    Number of Visits  47    Date for PT Re-Evaluation  12/26/17    Authorization Type  [employer] Deductible.  VL: MN    PT Start Time  1515    PT Stop Time  1600    PT Time Calculation (min)  45 min       Past Medical History:  Diagnosis Date  . Chicken pox     Past Surgical History:  Procedure Laterality Date  . LUMBAR LAMINECTOMY/DECOMPRESSION MICRODISCECTOMY N/A 08/01/2017   Procedure: Thoracic nine, ten, eleven, twelve laminectomy;  Surgeon: Tia Alert, MD;  Location: [facility] OR;  Service: Neurosurgery;  Laterality: N/A;  . none      There were no vitals filed for this visit.  Subjective Assessment - 10/31/17 0815    Subjective  Pt states the tone in his left leg is improving - not as much as it has been; wife present at pool - states she can tell improvement since he started pool therapy    Patient is accompained by:  Family member    Patient Stated Goals  To be more independent at home and improve LE strength    Currently in Pain?  No/denies                   Adult Aquatic Therapy - 10/31/17 0817      Aquatic Therapy Subjective   Subjective  Pt amb. from bleachers on pool deck to pool entrance with [facility] with min guard assist with SPC      Treatment   Gait  Pt amb. in 3'-5' depth of water without UE support on side of pool - forward, back and side directions 2 laps each direction: using buoyancy of water for support for balance    Exercises  Pt performed Lt hamstring stretch (runner's stretch ) x 60 sec hold x 1 reps for LLE stretching; Lt heel cord stretch 60 sec  hold 1 rep ;  Pt performed standing Lt hip flexion, extension and abduction with Lt foot dorsiflexed  10 reps each using viscosity of water for resistance.     Balance  Pt performed standing balance exercise to improve SLS - touching different colors of blue on pool floor  -           Pt performed jumping x 10 reps with bil. UE support ; progressed to unilateral hopping with UE support 10 reps on Each leg; jogging in place 10 reps each leg for high level balance training Pt performed crossovers front 25' without UE support x 2 reps; stepping behind with RLE behind LLE and then LLE behind RLE x 2 reps  In 5' depth of water; buoyancy of water used for support to prevent LOB/falling  Pt pushed pool noodle down into water with LLE using hydrostatic pressure on pool floor to hold for isometric contraction - 10 reps Stood on noodle with LLE - stepped up/back and laterally with RLE for LLE stabilization with cue to maintain Lt knee flexed for Lt knee control and to  Prevent hyperextension   Pt performed Lt and Rt  knee flexion - facing pool wall to block pelvis to prevent substitution of hip flexors - 10 reps each leg followed by 10 reps with increased speed using current of water for resistance  Pt jogged across pool 25' x 2 reps without UE support using viscosity of water for support  Core stabilization exercise - using hand buoys -moving UE's in horizontal abduction and adduction 10 reps for core stabilization and static standing balance - Viscosity of water provided support for balance compensation           PT Short Term Goals - 10/09/17 1344      PT SHORT TERM GOAL #1   Title  Pt will improve standing balance as indicated by increased BERG balance score to >/= 44/56    Baseline  40/56    Time  5    Period  Weeks    Status  Revised    Target Date  11/19/17      PT SHORT TERM GOAL #2   Title  Pt will participate in initial fitting and training of AFO for indoor/outdoor  surfaces with cane    Time  5    Period  Weeks    Status  Revised    Target Date  11/19/17      PT SHORT TERM GOAL #3   Title  Pt will improve five time sit <> stand from arm chair to </= 15 seconds with supervision without use of UE to demonstrate increased functional LE strength    Baseline  16 seconds without UE and min A from therapist    Time  5    Period  Weeks    Status  Revised    Target Date  11/19/17      PT SHORT TERM GOAL #4   Title  Pt will improve gait velocity with RW to >/= 1.8 ft/sec with cane and AFO    Baseline  1.3 with cane, no AFO    Time  5    Period  Weeks    Status  Revised    Target Date  11/19/17      PT SHORT TERM GOAL #5   Title  Pt will demonstrate ability to ambulate safely carry baby carrier over indoor surfaces with cane and supervision and up/down stairs with one rail    Time  5    Period  Weeks    Status  New    Target Date  11/19/17        PT Long Term Goals - 10/09/17 1727      PT LONG TERM GOAL #1   Title  Pt will be independent with balance and LE strengthening HEP    Time  10    Period  Weeks    Status  Revised    Target Date  12/26/17      PT LONG TERM GOAL #2   Title  Pt will improve standing balance and BERG >/= 50/56 to decrease falls risk    Time  10    Period  Weeks    Status  Revised    Target Date  12/26/17      PT LONG TERM GOAL #3   Title  Pt will improve gait velocity to >/= 2.5 ft/sec with appropriate AFO/Bioness and LRAD or no AD    Time  10    Period  Weeks    Status  Revised    Target Date  12/26/17      PT LONG  TERM GOAL #4   Title  Pt will ambulate x 1000 over outdoor surfaces (paved and grassy) MOD I with most appropriate AFO and LRAD or no AD; will negotiate 12 stairs, one rails, alternating sequence MOD I    Time  10    Period  Weeks    Status  Revised    Target Date  12/26/17      PT LONG TERM GOAL #5   Title  Pt will demonstrate ability to transition floor <> stand MOD I and perform infant  care activities (carry, load carrier, push stroller, in the floor to play or change diaper, etc) with AFO and LRAD/or no AD    Time  10    Period  Weeks    Status  New    Target Date  12/26/17      PT LONG TERM GOAL #6   Title  Pt will report being able to stand on sidelines at football practice and game with AFO and LRAD/or no AD with intermittent seated rest breaks and move away from players quickly without LOB    Time  10    Period  Weeks    Status  New    Target Date  12/26/17            Plan - 10/31/17 0825    Clinical Impression Statement  Pt demonstrating improvement in balance and increased LLE isolated movement due to decreased spasticity in LLE; pt now able to amb. from bleachers on pool side to pool entrance with [facility] with min guard assist only ( as pt not wearing shoes);  pt reported some feeling of discomfort/tightness with jumping/hopping activities with LLE weight-bearing in pool - may possibly be secondary to tight TFL/IT band; will assess in treatment on land    Rehab Potential  Good    Clinical Impairments Affecting Rehab Potential  will have to have cervical laminectomy soon    PT Frequency  3x / week    PT Duration  Other (comment)    PT Treatment/Interventions  ADLs/Self Care Home Management;Cryotherapy;Electrical Stimulation;Moist Heat;DME Instruction;Gait Network engineer;Therapeutic activities;Functional mobility training;Therapeutic exercise;Balance training;Neuromuscular re-education;Patient/family education;Orthotic Fit/Training;Passive range of motion;Taping;Aquatic Therapy    PT Next Visit Plan   CHRISTINA- PLEASE ASSESS TFL/IT band tightness as he c/o pressure in this area in pool with LLE jumping acvities : Bioness - treadmill, gait without AD; with AD outside on grass, stairs, curb, ramp, obstacle negotiation; begin to incorporate infant care activities and gait while carrying items into treatment sessions; Core/back strengthening - prone, tall  kneeling, half kneel, quadruped, bridges.  Agility training    PT Home Exercise Plan  [email or web address]    baby daughter is due August 8th - begin to incorporate infant care activities into therapy sessions    Consulted and Agree with Plan of Care  Patient    Family Member Consulted  wife - "T"       Patient will benefit from skilled therapeutic intervention in order to improve the following deficits and impairments:  Abnormal gait, Decreased balance, Decreased coordination, Decreased strength, Difficulty walking, Increased muscle spasms, Impaired sensation, Pain, Impaired tone, Decreased range of motion  Visit Diagnosis: Unsteadiness  Other abnormalities of gait and mobility  Other symptoms and signs involving the nervous system     Problem List Patient Active Problem List   Diagnosis Date Noted  . Leukocytosis   . Neuropathic pain   . Drug induced constipation   . Urinary retention   . Class 1  obesity due to excess calories with serious comorbidity and body mass index (BMI) of 33.0 to 33.9 in adult   . Myelopathy (HCC) 08/01/2017  . Thoracic spinal stenosis 07/31/2017    Kary KosDilday, Matie Dimaano Suzanne, PT 10/31/2017, 8:34 AM  [facility] [address] [place], [place], [address] Phone: [phone]   Fax:  [phone]  Name: Scott Lane MRN: [id number] Date of Birth: [birth year]

## 2017-11-01 ENCOUNTER — Ambulatory Visit: Payer: 59 | Admitting: Rehabilitative and Restorative Service Providers"

## 2017-11-04 DIAGNOSIS — R030 Elevated blood-pressure reading, without diagnosis of hypertension: Secondary | ICD-10-CM | POA: Diagnosis not present

## 2017-11-04 DIAGNOSIS — M4804 Spinal stenosis, thoracic region: Secondary | ICD-10-CM | POA: Diagnosis not present

## 2017-11-04 DIAGNOSIS — Z6833 Body mass index (BMI) 33.0-33.9, adult: Secondary | ICD-10-CM | POA: Diagnosis not present

## 2017-11-06 ENCOUNTER — Ambulatory Visit: Payer: 59 | Admitting: Rehabilitative and Restorative Service Providers"

## 2017-11-06 DIAGNOSIS — M6281 Muscle weakness (generalized): Secondary | ICD-10-CM | POA: Diagnosis not present

## 2017-11-06 DIAGNOSIS — R2689 Other abnormalities of gait and mobility: Secondary | ICD-10-CM

## 2017-11-06 DIAGNOSIS — R208 Other disturbances of skin sensation: Secondary | ICD-10-CM | POA: Diagnosis not present

## 2017-11-06 DIAGNOSIS — R29818 Other symptoms and signs involving the nervous system: Secondary | ICD-10-CM

## 2017-11-06 DIAGNOSIS — R2681 Unsteadiness on feet: Secondary | ICD-10-CM

## 2017-11-06 DIAGNOSIS — G8222 Paraplegia, incomplete: Secondary | ICD-10-CM | POA: Diagnosis not present

## 2017-11-06 NOTE — Patient Instructions (Signed)
Access Code: [email or web address]  URL: [email or web address]  Date: 11/06/2017  Prepared by: Margretta Dittyhristina Ka Flammer   Exercises Quadruped Leg Lifts - 5 reps - 2-3 sets - 1x daily - 3x weekly Wall Quarter Squat - 3-5 reps - 2 sets - 1x daily - 7x weekly Sit to Stand without Arm Support - 10 reps - 2 sets - 1x daily - 7x weekly Single Leg Sit to Stand with Arms Extended - 10 reps - 2 sets - 1x daily - 7x weekly Clamshell - 10 reps - 2 sets - 1x daily - 7x weekly Standing Quadriceps Stretch - 10 reps - 3 sets - 1x daily - 7x weekly Prone Knee Flexion Extension AROM - 10 reps - 3 sets - 1x daily - 7x weekly Supine Bridge - 10 reps - 3 sets - 1x daily - 7x weekly

## 2017-11-06 NOTE — Therapy (Signed)
[facility] [address] [place], [place], [address] Phone: [phone]   Fax:  [phone]  Physical Therapy Treatment  Patient Details  Name: Scott Lane MRN: [id number] Date of Birth: [birth year] Referring Provider: Maryla Morrow, MD   Encounter Date: 11/06/2017  PT End of Session - 11/06/17 1118    Visit Number  25    Number of Visits  47    Date for PT Re-Evaluation  12/26/17    Authorization Type  [employer] Deductible.  VL: MN    PT Start Time  0935    PT Stop Time  1020    PT Time Calculation (min)  45 min    Activity Tolerance  Patient tolerated treatment well    Behavior During Therapy  WFL for tasks assessed/performed       Past Medical History:  Diagnosis Date  . Chicken pox     Past Surgical History:  Procedure Laterality Date  . LUMBAR LAMINECTOMY/DECOMPRESSION MICRODISCECTOMY N/A 08/01/2017   Procedure: Thoracic nine, ten, eleven, twelve laminectomy;  Surgeon: Tia Alert, MD;  Location: [facility] OR;  Service: Neurosurgery;  Laterality: N/A;  . none      There were no vitals filed for this visit.  Subjective Assessment - 11/06/17 0936    Subjective  The patient feels stiffness in his left knee when walking "like I don't trust it, like it's going to give out" noting sensation at initial swing phase of gait.  He is scheduled to see Dr. Terrace Arabia tomorrow for botox assessment.    Patient Stated Goals  To be more independent at home and improve LE strength    Currently in Pain?  No/denies                       [facility] Adult PT Treatment/Exercise - 11/06/17 1111      Ambulation/Gait   Ambulation/Gait  Yes    Ambulation/Gait Assistance  4: Min guard;6: Modified independent (Device/Increase time)    Ambulation/Gait Assistance Details  Patient walks in with SPC mod indep, however he abducts the L LE, performs min circumduction L LE during swing phase, has L hip retraction and  significantly slowed pace during gait.  PT performed gait with SPC with tactile cues at L hip initiation moving hip over knee from mid stance to terminal stance and cuing L knee flexion to initiate swing phase.      Ambulation Distance (Feet)  120 Feet x 3 reps    Assistive device  Straight cane    Gait Pattern  Step-through pattern;Decreased step length - right;Decreased stance time - left;Decreased hip/knee flexion - left;Decreased dorsiflexion - left;Decreased weight shift to left;Left genu recurvatum    Ambulation Surface  Level;Indoor      Self-Care   Self-Care  Other Self-Care Comments    Other Self-Care Comments   PT and patient discussed being able to return slowly to gym routine.  The patient discusses recent weight gain noted at MD office and missing his gym routine.  PT recommended:  1) Begin at low frequency 1-2 days/week 2) Go at low census times initially 3)Use RW to get into/out of gym 4) Back weights down to minimal weight to begin and slowly work on increasing to tolerance.                                Discussed HEP and PT  modified.  Encouraged patient to perform daily (will continue to modify at next session for updated program).  Also discussed potential need for AFO.      Exercises   Exercises  Other Exercises    Other Exercises   RIGHT SIDELYING:  L hip abduction with mod cues for hip position, knee extension and to reduce hip ER for compensatory strategies.  PT also worked on abducting from pillow under foot to reduce the ROM initially to make it easier.  Patient toleated clam shells well and these were added to HEP.  Reviewed QUADRIPED hip extension with tactile and verbal cues for hip position and L knee extension.  PRONE:  Knee flexion using mirror for cues on position in space to decrease ER of hip.  Worked on quad stretch prone (plan to try modified standing quad stretch next session).                     Assessed IT band on the left side due to sensation of tightness and  "locking" during gait.  See assessment*.  SUPINE:  bridges x 10 reps adding ball squeeze to engage adductors.               PT Short Term Goals - 10/09/17 1344      PT SHORT TERM GOAL #1   Title  Pt will improve standing balance as indicated by increased BERG balance score to >/= 44/56    Baseline  40/56    Time  5    Period  Weeks    Status  Revised    Target Date  11/19/17      PT SHORT TERM GOAL #2   Title  Pt will participate in initial fitting and training of AFO for indoor/outdoor surfaces with cane    Time  5    Period  Weeks    Status  Revised    Target Date  11/19/17      PT SHORT TERM GOAL #3   Title  Pt will improve five time sit <> stand from arm chair to </= 15 seconds with supervision without use of UE to demonstrate increased functional LE strength    Baseline  16 seconds without UE and min A from therapist    Time  5    Period  Weeks    Status  Revised    Target Date  11/19/17      PT SHORT TERM GOAL #4   Title  Pt will improve gait velocity with RW to >/= 1.8 ft/sec with cane and AFO    Baseline  1.3 with cane, no AFO    Time  5    Period  Weeks    Status  Revised    Target Date  11/19/17      PT SHORT TERM GOAL #5   Title  Pt will demonstrate ability to ambulate safely carry baby carrier over indoor surfaces with cane and supervision and up/down stairs with one rail    Time  5    Period  Weeks    Status  New    Target Date  11/19/17        PT Long Term Goals - 10/09/17 1727      PT LONG TERM GOAL #1   Title  Pt will be independent with balance and LE strengthening HEP    Time  10    Period  Weeks    Status  Revised    Target  Date  12/26/17      PT LONG TERM GOAL #2   Title  Pt will improve standing balance and BERG >/= 50/56 to decrease falls risk    Time  10    Period  Weeks    Status  Revised    Target Date  12/26/17      PT LONG TERM GOAL #3   Title  Pt will improve gait velocity to >/= 2.5 ft/sec with appropriate  AFO/Bioness and LRAD or no AD    Time  10    Period  Weeks    Status  Revised    Target Date  12/26/17      PT LONG TERM GOAL #4   Title  Pt will ambulate x 1000 over outdoor surfaces (paved and grassy) MOD I with most appropriate AFO and LRAD or no AD; will negotiate 12 stairs, one rails, alternating sequence MOD I    Time  10    Period  Weeks    Status  Revised    Target Date  12/26/17      PT LONG TERM GOAL #5   Title  Pt will demonstrate ability to transition floor <> stand MOD I and perform infant care activities (carry, load carrier, push stroller, in the floor to play or change diaper, etc) with AFO and LRAD/or no AD    Time  10    Period  Weeks    Status  New    Target Date  12/26/17      PT LONG TERM GOAL #6   Title  Pt will report being able to stand on sidelines at football practice and game with AFO and LRAD/or no AD with intermittent seated rest breaks and move away from players quickly without LOB    Time  10    Period  Weeks    Status  New    Target Date  12/26/17            Plan - 11/06/17 1122    Clinical Impression Statement  PT and patient discussed some of his gait concerns including sensation of tightness and locking at the lateral knee during late stance to initial swing.  PT palpated IT band and assessed length.  Patient continues with significant L LE weakness and is using a locking out mechanism at the left knee (keeping L hip posterior to knee at terminal stance, and keeping tibia posterior to ankle) which is leading to further sensation of "locking out" at the knee.  We reviewed prior HEP and emphasized knee flexion for hamstring stretngthening and gluteus medius strengthening.  PT also discussed that an AFO would be beneficial however he reports trying them and not liking how restrictive they feel (this was done on 7/9 per notes).  PT thinks we should revisit AFOs to encourage more normalized gait pattern and limit maladaptive compensatory strategies.       PT Treatment/Interventions  ADLs/Self Care Home Management;Cryotherapy;Electrical Stimulation;Moist Heat;DME Instruction;Gait Network engineer;Therapeutic activities;Functional mobility training;Therapeutic exercise;Balance training;Neuromuscular re-education;Patient/family education;Orthotic Fit/Training;Passive range of motion;Taping;Aquatic Therapy    PT Next Visit Plan  Finish modifying HEP, work with AFOs (try toe off next) to determine which feels least restrictive yet still provides knee control and improved foot clearance; bioness as needed (treadmill, gait without device, with device gait outdoors, curbs, ramps); incorporate infant care activities with gait during session.  Continue LE strengthening, agility training.    Consulted and Agree with Plan of Care  Patient  Patient will benefit from skilled therapeutic intervention in order to improve the following deficits and impairments:  Abnormal gait, Decreased balance, Decreased coordination, Decreased strength, Difficulty walking, Increased muscle spasms, Impaired sensation, Pain, Impaired tone, Decreased range of motion  Visit Diagnosis: Unsteadiness  Other abnormalities of gait and mobility  Other symptoms and signs involving the nervous system  Muscle weakness (generalized)     Problem List Patient Active Problem List   Diagnosis Date Noted  . Leukocytosis   . Neuropathic pain   . Drug induced constipation   . Urinary retention   . Class 1 obesity due to excess calories with serious comorbidity and body mass index (BMI) of 33.0 to 33.9 in adult   . Myelopathy (HCC) 08/01/2017  . Thoracic spinal stenosis 07/31/2017    Areeba Sulser , PT 11/06/2017, 11:26 AM  [facility] [address] [place], [place], [address] Phone: [phone]   Fax:  [phone]  Name: Scott Lane MRN: [id number] Date of Birth: [birth year]

## 2017-11-07 ENCOUNTER — Ambulatory Visit (INDEPENDENT_AMBULATORY_CARE_PROVIDER_SITE_OTHER): Payer: 59 | Admitting: Neurology

## 2017-11-07 ENCOUNTER — Telehealth: Payer: Self-pay | Admitting: Neurology

## 2017-11-07 ENCOUNTER — Encounter: Payer: Self-pay | Admitting: *Deleted

## 2017-11-07 ENCOUNTER — Encounter: Payer: Self-pay | Admitting: Neurology

## 2017-11-07 ENCOUNTER — Encounter

## 2017-11-07 ENCOUNTER — Encounter: Payer: Self-pay | Admitting: Physical Therapy

## 2017-11-07 ENCOUNTER — Ambulatory Visit: Payer: 59 | Attending: Physical Medicine & Rehabilitation | Admitting: Physical Therapy

## 2017-11-07 VITALS — BP 121/80 | HR 83 | Ht 72.0 in | Wt 248.0 lb

## 2017-11-07 DIAGNOSIS — G959 Disease of spinal cord, unspecified: Secondary | ICD-10-CM

## 2017-11-07 DIAGNOSIS — R208 Other disturbances of skin sensation: Secondary | ICD-10-CM | POA: Insufficient documentation

## 2017-11-07 DIAGNOSIS — G8222 Paraplegia, incomplete: Secondary | ICD-10-CM | POA: Diagnosis not present

## 2017-11-07 DIAGNOSIS — R29818 Other symptoms and signs involving the nervous system: Secondary | ICD-10-CM | POA: Diagnosis not present

## 2017-11-07 DIAGNOSIS — M6281 Muscle weakness (generalized): Secondary | ICD-10-CM | POA: Diagnosis not present

## 2017-11-07 DIAGNOSIS — M4804 Spinal stenosis, thoracic region: Secondary | ICD-10-CM

## 2017-11-07 DIAGNOSIS — G8220 Paraplegia, unspecified: Secondary | ICD-10-CM | POA: Insufficient documentation

## 2017-11-07 DIAGNOSIS — G8250 Quadriplegia, unspecified: Secondary | ICD-10-CM | POA: Insufficient documentation

## 2017-11-07 DIAGNOSIS — R2689 Other abnormalities of gait and mobility: Secondary | ICD-10-CM | POA: Diagnosis not present

## 2017-11-07 DIAGNOSIS — R2681 Unsteadiness on feet: Secondary | ICD-10-CM | POA: Diagnosis not present

## 2017-11-07 MED ORDER — TIZANIDINE HCL 4 MG PO TABS: 4.0000 mg | ORAL_TABLET | Freq: Three times a day (TID) | ORAL | 6 refills | Status: DC

## 2017-11-07 NOTE — Progress Notes (Signed)
PATIENT: Scott Lane DOB: July 16, 1985  Chief Complaint  Patient presents with  . New Patient (Initial Visit)    rm .4, pt with wife, pt presents to discuss botox due to the stiffness in left leg. pt has been having this since spinal surgery 3 mths ago     HISTORICAL  Scott Lane is 32 years old male, seen in request by his primary care physician Dr.Stephen Durene CalHunter for evaluation of Botox injection for spastic left lower extremity, He is accompanied by his wife, initial evaluation was on November 07, 2017.  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, used to work out regularly, in early April 2019, during the workout, he felt lower extremity weakness, left leg worse than the right side, difficulty clear left leg from the floor, on July 31, 2017, [place], he was throwing a ball to his partner, then had sudden onset dense numbness at bilateral lower extremity, collapsed to the floor, lower extremity weakness, I personally reviewed film on July 31, 2017 prior to his decompression surgery  MRI thoracic spine demonstrated mild congenital narrowing of the central canal throughout due to short pedicle length, severe central canal stenosis at T9-10 due to shallow disc bulging bulky ligamentum flavum thickening, edema of the cord at this level, also marked central canal stenosis at T11-12, ligamentum flavum thickening and shallow disc bulging at T2-3, causing moderate to severe central canal stenosis,  MRI of lumbar spine congenital lateral central spinal canal throughout, broad-based right paracentral protrusion at L4-5, impinging on descending right L5 nerve roots, moderate right foraminal narrowing, moderate to severe bilateral foraminal narrowing at L5-S1.  MRI of cervical spine, congenital canal narrowing, no significant canal or foraminal narrowing,  He underwent decompressive thoracic laminectomy, medial facetectomy T9-10 and T10-11 T11-12 by Marikay Alaravid Jones, MD  Post surgically,he had significant  recovery with ongoing physical therapy, but he still has significant bilateral lower extremity spasticity, left worse than right, difficulty clearing left leg from floor, mild urinary spasticity, hesitant to start,  REVIEW OF SYSTEMS: Full 14 system review of systems performed and notable only for as above  ALLERGIES: No Known Allergies  HOME MEDICATIONS: Current Outpatient Medications  Medication Sig Dispense Refill  . baclofen (LIORESAL) 20 MG tablet Take 20 mg by mouth 3 (three) times daily.      No current facility-administered medications for this visit.     PAST MEDICAL HISTORY: Past Medical History:  Diagnosis Date  . Chicken pox     PAST SURGICAL HISTORY: Past Surgical History:  Procedure Laterality Date  . LUMBAR LAMINECTOMY/DECOMPRESSION MICRODISCECTOMY N/A 08/01/2017   Procedure: Thoracic nine, ten, eleven, twelve laminectomy;  Surgeon: Tia AlertJones, David S, MD;  [facility];  Service: Neurosurgery;  Laterality: N/A;  . none    . WISDOM TOOTH EXTRACTION     bilateral lower.    FAMILY HISTORY: Family History  Problem Relation Age of Onset  . Hypertension Mother   . Diabetes Mother   . Hypertension Father   . Lung cancer Father        smoker  . Hypertension Brother   . Hypertension Maternal Grandmother   . Lung cancer Maternal Grandmother        non smoker  . Hypertension Paternal Grandmother     SOCIAL HISTORY: Social History   Socioeconomic History  . Marital status: Married    Spouse name: Not on file  . Number of children: Not on file  . Years of education: Not on file  .  Highest education level: Not on file  Occupational History  . Not on file  Social Needs  . Financial resource strain: Not on file  . Food insecurity:    Worry: Not on file    Inability: Not on file  . Transportation needs:    Medical: Not on file    Non-medical: Not on file  Tobacco Use  . Smoking status: Never Smoker  . Smokeless tobacco: Never Used  Substance and Sexual  Activity  . Alcohol use: Yes    Comment: occasional on weekend  . Drug use: No  . Sexual activity: Yes  Lifestyle  . Physical activity:    Days per week: Not on file    Minutes per session: Not on file  . Stress: Not on file  Relationships  . Social connections:    Talks on phone: Not on file    Gets together: Not on file    Attends religious service: Not on file    Active member of club or organization: Not on file    Attends meetings of clubs or organizations: Not on file    Relationship status: Not on file  . Intimate partner violence:    Fear of current or ex partner: Not on file    Emotionally abused: Not on file    Physically abused: Not on file    Forced sexual activity: Not on file  Other Topics Concern  . Not on file  Social History Narrative   Married 2018. Wife pregnant with first child- daughter.    Wife works IT with [employer].       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      PHYSICAL EXAM   Vitals:   11/07/17 0719  BP: 121/80  Pulse: 83  Weight: 248 lb (112.5 kg)  Height: 6' (1.829 m)    Not recorded      Body mass index is 33.63 kg/m.  PHYSICAL EXAMNIATION:  Gen: NAD, conversant, well nourised, obese, well groomed                     Cardiovascular: Regular rate rhythm, no peripheral edema, warm, nontender. Eyes: Conjunctivae clear without exudates or hemorrhage Neck: Supple, no carotid bruits. Pulmonary: Clear to auscultation bilaterally   NEUROLOGICAL EXAM:  MENTAL STATUS: Speech:    Speech is normal; fluent and spontaneous with normal comprehension.  Cognition:     Orientation to time, place and person     Normal recent and remote memory     Normal Attention span and concentration     Normal Language, naming, repeating,spontaneous speech     Fund of knowledge   CRANIAL NERVES: CN  II: Visual fields are full to confrontation. Fundoscopic exam is normal with sharp discs and no vascular changes. Pupils are round equal and briskly reactive to light. CN III, IV, VI: extraocular movement are normal. No ptosis. CN V: Facial sensation is intact to pinprick in all 3 divisions bilaterally. Corneal responses are intact.  CN VII: Face is symmetric with normal eye closure and smile. CN VIII: Hearing is normal to rubbing fingers CN IX, X: Palate elevates symmetrically. Phonation is normal. CN XI: Head turning and shoulder shrug are intact CN XII: Tongue is midline with normal movements and no atrophy.  MOTOR: Bilateral  upper extremities normal, bilateral lower extremity spasticity, mild on the right side, mild to moderate on the left side, there was no significant muscle weakness,  REFLEXES: Reflexes are 2+ and symmetric at the biceps, triceps, 3 knees, and ankles, with sustained ankle clonus on the left side. Plantar responses are extensor bilaterally  SENSORY: Intact to light touch, pinprick, positional sensation and vibratory sensation are intact in fingers and toes.  COORDINATION: Rapid alternating movements and fine finger movements are intact. There is no dysmetria on finger-to-nose and heel-knee-shin.    GAIT/STANCE: He needs push up to get up from seated position, cautious, stiff, dragging left leg.  DIAGNOSTIC DATA (LABS, IMAGING, TESTING) - I reviewed patient records, labs, notes, testing and imaging myself where available.   ASSESSMENT AND PLAN  QUAID YEAKLE is a 32 y.o. male   Thoracic myelopathy with residue spastic paraplegia   Continue physical therapy.  Electrical stimulation guided xeomin injection for spastic left lower extremity, ask for 600 units, will use 300 units at initial injection.  Levert Feinstein, M.D. Ph.D.  [facility] [address] [place], [place] [address] Ph: [phone] Fax: [phone]  CC: Referring  Provider

## 2017-11-07 NOTE — Therapy (Signed)
[facility] [address] [place], [place], [address] Phone: [phone]   Fax:  [phone]  Physical Therapy Treatment  Patient Details  Name: Scott Lane MRN: [id number] Date of Birth: March 13, 1986 Referring Provider: Maryla Morrow, MD   Encounter Date: 11/07/2017  PT End of Session - 11/07/17 0852    Visit Number  26    Number of Visits  47    Date for PT Re-Evaluation  12/26/17    Authorization Type  [place] [employer] Deductible.  VL: MN    PT Start Time  0822    PT Stop Time  0907    PT Time Calculation (min)  45 min    Activity Tolerance  Patient limited by fatigue    Behavior During Therapy  [facility] for tasks assessed/performed       Past Medical History:  Diagnosis Date  . Chicken pox     Past Surgical History:  Procedure Laterality Date  . LUMBAR LAMINECTOMY/DECOMPRESSION MICRODISCECTOMY N/A 08/01/2017   Procedure: Thoracic nine, ten, eleven, twelve laminectomy;  Surgeon: Tia Alert, MD;  Location: [facility] OR;  Service: Neurosurgery;  Laterality: N/A;  . none    . WISDOM TOOTH EXTRACTION     bilateral lower.    There were no vitals filed for this visit.  Subjective Assessment - 11/07/17 0826    Subjective  Patient had consult this morning with Dr. Terrace Arabia for botox.  Waiting for insurance authorization.  He mentioned potential for quad injection.    Patient Stated Goals  To be more independent at home and improve LE strength    Currently in Pain?  No/denies                       [facility] Adult PT Treatment/Exercise - 11/07/17 0853      Ambulation/Gait   Ambulation/Gait  Yes    Ambulation/Gait Assistance  4: Min guard;4: Min assist    Ambulation/Gait Assistance Details  Ambulating into clinic with [facility] *patient uses L knee recurvatum to reduce risk of L knee buckling during gait.  PT encouraged with tactile cues L anterior translation of hip over knee and provided some tactile cues for L knee  flexion.  PT donned L AFO toe off brace and had patient ambulate on treadmill x 3 minutes at 1.2 mph (with bilat UE support) with tactile cues for knee flexion.  Then ambulated off treadmill with CGA and cues to flex knee to initiate swing phase.  Attempted squat walking with [facility] *patient notes his left knee feels like it's going ot buckle.    Ambulation Distance (Feet)  120 Feet    Assistive device  Straight cane    Gait Pattern  Step-through pattern;Decreased step length - right;Decreased stance time - left;Decreased hip/knee flexion - left;Decreased dorsiflexion - left;Decreased weight shift to left;Left genu recurvatum    Ambulation Surface  Level;Indoor    Gait Comments  Also performed gait assessment over indoor and outdoor surfaces with use of 1 trekking pole for increased use of UE during gait to assess effectiveness of trekking pole to provide pt with increased stability and endurance to ambulate into and out of [employer] (has to park far away, even with handicap parking placard).  Following LE exercises pt unable to ambulate full distance to "handicap spot" and had to return indoors due to LE weakness/fatigue.  Pt also concerned about having to have conversations with people at the gym about his injury, "I  just want to work out".  Discussed other options: going a different time of day, use of endurance equipment, use of pool for endurance.      Neuro Re-ed    Neuro Re-ed Details   Emphasis of treatment to gain co-contraction around knee while tibia anteriorly translated over ankle.  1) Performed sitting edge of elevated mat with feet in contact with the ground and kicked R leg anteriorly (this engages L quad as patient has to hold himself up with L leg in this position).    2) loading through UEs on elevated mat maintaining bilat knee flexion with PT mod A to maintain knee control kicking R leg posteriorly (mini kick) 3) Mini squat with bilat UE support doing mini kick R leg into abduction with tactile  cues + min A to maintain L knee flexion in loading.  4) Quadriped with toes tucked with assist to hold position lifting knees to hover above mat x 3 reps, then going into partial downn dog position encouraging L LE loading through tactile cues with min A.             PT Education - 11/07/17 0925    Education provided  Yes    Education Details  reviewed Toe off brace/AFO and recommendations, exercises to address knee/quad weakness in WB, recommendations for going to [facility]) Educated  Patient;Spouse    Methods  Explanation    Comprehension  Verbalized understanding       PT Short Term Goals - 10/09/17 1344      PT SHORT TERM GOAL #1   Title  Pt will improve standing balance as indicated by increased BERG balance score to >/= 44/56    Baseline  40/56    Time  5    Period  Weeks    Status  Revised    Target Date  11/19/17      PT SHORT TERM GOAL #2   Title  Pt will participate in initial fitting and training of AFO for indoor/outdoor surfaces with cane    Time  5    Period  Weeks    Status  Revised    Target Date  11/19/17      PT SHORT TERM GOAL #3   Title  Pt will improve five time sit <> stand from arm chair to </= 15 seconds with supervision without use of UE to demonstrate increased functional LE strength    Baseline  16 seconds without UE and min A from therapist    Time  5    Period  Weeks    Status  Revised    Target Date  11/19/17      PT SHORT TERM GOAL #4   Title  Pt will improve gait velocity with RW to >/= 1.8 ft/sec with cane and AFO    Baseline  1.3 with cane, no AFO    Time  5    Period  Weeks    Status  Revised    Target Date  11/19/17      PT SHORT TERM GOAL #5   Title  Pt will demonstrate ability to ambulate safely carry baby carrier over indoor surfaces with cane and supervision and up/down stairs with one rail    Time  5    Period  Weeks    Status  New    Target Date  11/19/17        PT Long Term Goals - 10/09/17 1727  PT  LONG TERM GOAL #1   Title  Pt will be independent with balance and LE strengthening HEP    Time  10    Period  Weeks    Status  Revised    Target Date  12/26/17      PT LONG TERM GOAL #2   Title  Pt will improve standing balance and BERG >/= 50/56 to decrease falls risk    Time  10    Period  Weeks    Status  Revised    Target Date  12/26/17      PT LONG TERM GOAL #3   Title  Pt will improve gait velocity to >/= 2.5 ft/sec with appropriate AFO/Bioness and LRAD or no AD    Time  10    Period  Weeks    Status  Revised    Target Date  12/26/17      PT LONG TERM GOAL #4   Title  Pt will ambulate x 1000 over outdoor surfaces (paved and grassy) MOD I with most appropriate AFO and LRAD or no AD; will negotiate 12 stairs, one rails, alternating sequence MOD I    Time  10    Period  Weeks    Status  Revised    Target Date  12/26/17      PT LONG TERM GOAL #5   Title  Pt will demonstrate ability to transition floor <> stand MOD I and perform infant care activities (carry, load carrier, push stroller, in the floor to play or change diaper, etc) with AFO and LRAD/or no AD    Time  10    Period  Weeks    Status  New    Target Date  12/26/17      PT LONG TERM GOAL #6   Title  Pt will report being able to stand on sidelines at football practice and game with AFO and LRAD/or no AD with intermittent seated rest breaks and move away from players quickly without LOB    Time  10    Period  Weeks    Status  New    Target Date  12/26/17            Plan - 11/07/17 0858    Clinical Impression Statement  PT emphasizing strengthening in L hip/knee to improve knee control and therefore improve gait mechanics.  Sensation of knee buckling is leading to recurvatum and a "catching" sensation at initial swing phase.  Patient does not appear to have IT band tightness, but is getting a catching sensation from leg weakness + compensatory recurvatum.  Continued to discuss use of [employer] for gym exercises  on days when pt is not in therapy and barriers to using gym.  Assessed effectiveness of trekking pole for stability and energy conservation indoors and outdoors with minimal difference from use of cane.  Will continue to address in order to progress towards LTG.    PT Treatment/Interventions  ADLs/Self Care Home Management;Cryotherapy;Electrical Stimulation;Moist Heat;DME Instruction;Gait Network engineer;Therapeutic activities;Functional mobility training;Therapeutic exercise;Balance training;Neuromuscular re-education;Patient/family education;Orthotic Fit/Training;Passive range of motion;Taping;Aquatic Therapy    PT Next Visit Plan  Dr Terrace Arabia plans to botox - revisit AFO after Botox.  Bioness on L quad and DF while performing WB knee stability exercises in flexion; bioness as needed (treadmill, gait without device, with device gait outdoors, curbs, ramps); incorporate infant care activities with gait during session.  Continue LE strengthening, agility training.    PT Home Exercise Plan  [id number]  baby daughter is due August 8th - begin to incorporate infant care activities into therapy sessions    Consulted and Agree with Plan of Care  Patient    Family Member Consulted  wife - "T"       Patient will benefit from skilled therapeutic intervention in order to improve the following deficits and impairments:  Abnormal gait, Decreased balance, Decreased coordination, Decreased strength, Difficulty walking, Increased muscle spasms, Impaired sensation, Pain, Impaired tone, Decreased range of motion  Visit Diagnosis: Unsteadiness  Other abnormalities of gait and mobility  Other symptoms and signs involving the nervous system  Muscle weakness (generalized)     Problem List Patient Active Problem List   Diagnosis Date Noted  . Paraplegia (HCC) 11/07/2017  . Quadriplegia, unspecified (HCC) 11/07/2017  . Leukocytosis   . Neuropathic pain   . Drug induced constipation   . Urinary retention    . Class 1 obesity due to excess calories with serious comorbidity and body mass index (BMI) of 33.0 to 33.9 in adult   . Myelopathy (HCC) 08/01/2017  . Thoracic spinal stenosis 07/31/2017    Dierdre HighmanAudra F Akim Watkinson, PT, DPT 11/07/17    12:08 PM    [facility] [address] [place], [place], [address] Phone: [phone]   Fax:  [phone]  Name: Orene DesanctisLee M Lerch MRN: [id number] Date of Birth: [birth year]

## 2017-11-07 NOTE — Telephone Encounter (Signed)
Pt. Needs 3wk Botox inj

## 2017-11-07 NOTE — Telephone Encounter (Signed)
I called and scheduled the patient for the injection. DW

## 2017-11-08 ENCOUNTER — Encounter: Payer: Self-pay | Admitting: Rehabilitative and Restorative Service Providers"

## 2017-11-08 ENCOUNTER — Ambulatory Visit: Payer: 59 | Admitting: Rehabilitative and Restorative Service Providers"

## 2017-11-08 DIAGNOSIS — M6281 Muscle weakness (generalized): Secondary | ICD-10-CM | POA: Diagnosis not present

## 2017-11-08 DIAGNOSIS — R2681 Unsteadiness on feet: Secondary | ICD-10-CM

## 2017-11-08 DIAGNOSIS — R29818 Other symptoms and signs involving the nervous system: Secondary | ICD-10-CM

## 2017-11-08 DIAGNOSIS — R2689 Other abnormalities of gait and mobility: Secondary | ICD-10-CM

## 2017-11-08 DIAGNOSIS — R208 Other disturbances of skin sensation: Secondary | ICD-10-CM | POA: Diagnosis not present

## 2017-11-08 DIAGNOSIS — G8222 Paraplegia, incomplete: Secondary | ICD-10-CM | POA: Diagnosis not present

## 2017-11-09 NOTE — Therapy (Signed)
[facility] [address] [place], [place], [address] Phone: [phone]   Fax:  [phone]  Physical Therapy Treatment  Patient Details  Name: Scott Lane MRN: [id number] Date of Birth: [birth year] Referring Provider: Maryla MorrowAnkit Patel, MD   Encounter Date: 11/08/2017  PT End of Session - 11/08/17 1412    Visit Number  27    Number of Visits  47    Date for PT Re-Evaluation  12/26/17    Authorization Type  [place] [employer] Deductible.  VL: MN    PT Start Time  1410    PT Stop Time  1450    PT Time Calculation (min)  40 min    Activity Tolerance  Patient limited by fatigue    Behavior During Therapy  WFL for tasks assessed/performed       Past Medical History:  Diagnosis Date  . Chicken pox     Past Surgical History:  Procedure Laterality Date  . LUMBAR LAMINECTOMY/DECOMPRESSION MICRODISCECTOMY N/A 08/01/2017   Procedure: Thoracic nine, ten, eleven, twelve laminectomy;  Surgeon: Tia AlertJones, David S, MD;  [facility];  Service: Neurosurgery;  Laterality: N/A;  . none    . WISDOM TOOTH EXTRACTION     bilateral lower.    There were no vitals filed for this visit.  Subjective Assessment - 11/08/17 1407    Subjective  The patient reports stiffness in legs when he stands up in lobby.  He reports his wife is scheduled to go into the hospital Sunday to be induced (childbirth).  PT recommended he bring his walker to the hospital to have faster, more reciprocal gait.    Patient Stated Goals  To be more independent at home and improve LE strength    Currently in Pain?  No/denies                       [facility] Adult PT Treatment/Exercise - 11/08/17 2053      Ambulation/Gait   Ambulation/Gait  Yes    Ambulation/Gait Assistance  5: Supervision;4: Min guard    Ambulation/Gait Assistance Details  The patient ambulates into clinic with SPC at significantly slowed pace with supervision.  PT donned Bioness L LE  and worked on anterior translation of hip and knee during stance phase.  Also performed squat walking forward/backward for quad strengthening.    Ambulation Distance (Feet)  230 Feet 3 reps    Assistive device  Straight cane    Gait Pattern  Step-through pattern;Decreased step length - right;Decreased stance time - left;Decreased hip/knee flexion - left;Decreased dorsiflexion - left;Decreased weight shift to left;Left genu recurvatum    Ambulation Surface  Level;Indoor      Neuro Re-ed    Neuro Re-ed Details   L LE closed chain hip, knee, ankle control.  Patient stood near mat with chair for intermittent UE support and performed knee flexion bilat to 20-30 degrees (knees supported on mat table due to concern for buckling).  Then performed left weight shifting activities leaning on physioball and extending right hip, placing R LE onto compliant surfaces to increase left weight shifting.  Performed with Bioness donned adding mini squats with training mode.        Exercises   Exercises  Knee/Hip      Knee/Hip Exercises: Stretches   Quad Stretch  Left;3 reps;30 seconds    Quad Stretch Limitations  *PT tried to stretch left quads standing near chair with weight through R LE and  L LE resting on mat surface.  Patient does not feel this stretch in his quad, therefore performed prone quad stretch with belt for L LE.      Modalities   Modalities  Geologist, engineering Location  L anterior tibialis and quad    Electrical Stimulation Action  ankle DF and knee extension    Electrical Stimulation Parameters  see saved parametes (L ant tib  needs steering electrode)    Electrical Stimulation Goals  Strength;Neuromuscular facilitation               PT Short Term Goals - 10/09/17 1344      PT SHORT TERM GOAL #1   Title  Pt will improve standing balance as indicated by increased BERG balance score to >/= 44/56    Baseline  40/56    Time  5     Period  Weeks    Status  Revised    Target Date  11/19/17      PT SHORT TERM GOAL #2   Title  Pt will participate in initial fitting and training of AFO for indoor/outdoor surfaces with cane    Time  5    Period  Weeks    Status  Revised    Target Date  11/19/17      PT SHORT TERM GOAL #3   Title  Pt will improve five time sit <> stand from arm chair to </= 15 seconds with supervision without use of UE to demonstrate increased functional LE strength    Baseline  16 seconds without UE and min A from therapist    Time  5    Period  Weeks    Status  Revised    Target Date  11/19/17      PT SHORT TERM GOAL #4   Title  Pt will improve gait velocity with RW to >/= 1.8 ft/sec with cane and AFO    Baseline  1.3 with cane, no AFO    Time  5    Period  Weeks    Status  Revised    Target Date  11/19/17      PT SHORT TERM GOAL #5   Title  Pt will demonstrate ability to ambulate safely carry baby carrier over indoor surfaces with cane and supervision and up/down stairs with one rail    Time  5    Period  Weeks    Status  New    Target Date  11/19/17        PT Long Term Goals - 10/09/17 1727      PT LONG TERM GOAL #1   Title  Pt will be independent with balance and LE strengthening HEP    Time  10    Period  Weeks    Status  Revised    Target Date  12/26/17      PT LONG TERM GOAL #2   Title  Pt will improve standing balance and BERG >/= 50/56 to decrease falls risk    Time  10    Period  Weeks    Status  Revised    Target Date  12/26/17      PT LONG TERM GOAL #3   Title  Pt will improve gait velocity to >/= 2.5 ft/sec with appropriate AFO/Bioness and LRAD or no AD    Time  10    Period  Weeks    Status  Revised  Target Date  12/26/17      PT LONG TERM GOAL #4   Title  Pt will ambulate x 1000 over outdoor surfaces (paved and grassy) MOD I with most appropriate AFO and LRAD or no AD; will negotiate 12 stairs, one rails, alternating sequence MOD I    Time  10     Period  Weeks    Status  Revised    Target Date  12/26/17      PT LONG TERM GOAL #5   Title  Pt will demonstrate ability to transition floor <> stand MOD I and perform infant care activities (carry, load carrier, push stroller, in the floor to play or change diaper, etc) with AFO and LRAD/or no AD    Time  10    Period  Weeks    Status  New    Target Date  12/26/17      PT LONG TERM GOAL #6   Title  Pt will report being able to stand on sidelines at football practice and game with AFO and LRAD/or no AD with intermittent seated rest breaks and move away from players quickly without LOB    Time  10    Period  Weeks    Status  New    Target Date  12/26/17            Plan - 11/09/17 0604    Clinical Impression Statement  The patient was able to tolerate greater left weight shifting today during NMR with bioness donned to give knee more stability.  PT to update HEP next week to include prone quad stretch (change on medbridge needed).  PT to continue working towards STGs/LTGS.    PT Treatment/Interventions  ADLs/Self Care Home Management;Cryotherapy;Electrical Stimulation;Moist Heat;DME Instruction;Gait Network engineer;Therapeutic activities;Functional mobility training;Therapeutic exercise;Balance training;Neuromuscular re-education;Patient/family education;Orthotic Fit/Training;Passive range of motion;Taping;Aquatic Therapy    PT Next Visit Plan   *update HEP to change to prone quad stretch.  Dr Terrace Arabia plans to botox - revisit AFO after Botox.  Bioness on L quad and DF while performing WB knee stability exercises in flexion; bioness as needed (treadmill, gait without device, with device gait outdoors, curbs, ramps); incorporate infant care activities with gait during session.  Continue LE strengthening, agility training.    Consulted and Agree with Plan of Care  Patient       Patient will benefit from skilled therapeutic intervention in order to improve the following deficits and  impairments:  Abnormal gait, Decreased balance, Decreased coordination, Decreased strength, Difficulty walking, Increased muscle spasms, Impaired sensation, Pain, Impaired tone, Decreased range of motion  Visit Diagnosis: Unsteadiness  Other abnormalities of gait and mobility  Other symptoms and signs involving the nervous system  Muscle weakness (generalized)     Problem List Patient Active Problem List   Diagnosis Date Noted  . Paraplegia (HCC) 11/07/2017  . Quadriplegia, unspecified (HCC) 11/07/2017  . Leukocytosis   . Neuropathic pain   . Drug induced constipation   . Urinary retention   . Class 1 obesity due to excess calories with serious comorbidity and body mass index (BMI) of 33.0 to 33.9 in adult   . Myelopathy (HCC) 08/01/2017  . Thoracic spinal stenosis 07/31/2017    Ennis Heavner, PT 11/09/2017, 6:08 AM  [facility] [address] [place], [place], [address] Phone: [phone]   Fax:  [phone]  Name: Scott Lane MRN: [id number] Date of Birth: Nov 27, 1985

## 2017-11-11 ENCOUNTER — Ambulatory Visit: Payer: 59 | Admitting: Rehabilitative and Restorative Service Providers"

## 2017-11-13 ENCOUNTER — Encounter: Payer: Self-pay | Admitting: Physical Therapy

## 2017-11-13 ENCOUNTER — Ambulatory Visit: Payer: 59 | Admitting: Physical Therapy

## 2017-11-13 DIAGNOSIS — M6281 Muscle weakness (generalized): Secondary | ICD-10-CM | POA: Diagnosis not present

## 2017-11-13 DIAGNOSIS — R2689 Other abnormalities of gait and mobility: Secondary | ICD-10-CM

## 2017-11-13 DIAGNOSIS — R29818 Other symptoms and signs involving the nervous system: Secondary | ICD-10-CM | POA: Diagnosis not present

## 2017-11-13 DIAGNOSIS — R2681 Unsteadiness on feet: Secondary | ICD-10-CM

## 2017-11-13 DIAGNOSIS — G8222 Paraplegia, incomplete: Secondary | ICD-10-CM | POA: Diagnosis not present

## 2017-11-13 DIAGNOSIS — R208 Other disturbances of skin sensation: Secondary | ICD-10-CM | POA: Diagnosis not present

## 2017-11-13 NOTE — Therapy (Signed)
[facility] [address] [place], [place], [address] Phone: [phone]   Fax:  [phone]  Physical Therapy Treatment  Patient Details  Name: Scott Lane MRN: [id number] Date of Birth: [birth year] Referring Provider: Maryla Morrow, MD   Encounter Date: 11/13/2017  PT End of Session - 11/13/17 1828    Visit Number  28    Number of Visits  47    Date for PT Re-Evaluation  12/26/17    Authorization Type  [place] Family Deductible.  VL: MN    PT Start Time  1452 pt arrived to pool early - started session early    PT Stop Time  1540    PT Time Calculation (min)  48 min    Equipment Utilized During Treatment  -- pool noodles used for flotation assistance    Activity Tolerance  Other (comment) LLE tremors with activity       Past Medical History:  Diagnosis Date  . Chicken pox     Past Surgical History:  Procedure Laterality Date  . LUMBAR LAMINECTOMY/DECOMPRESSION MICRODISCECTOMY N/A 08/01/2017   Procedure: Thoracic nine, ten, eleven, twelve laminectomy;  Surgeon: Tia Alert, MD;  Location: [facility] OR;  Service: Neurosurgery;  Laterality: N/A;  . none    . WISDOM TOOTH EXTRACTION     bilateral lower.    There were no vitals filed for this visit.  Subjective Assessment - 11/13/17 1820    Subjective  Pt using RW for assistance with ambulation; pt states he slept in recliner at hospital Sunday night while his wife was in labor and feels that this really set him back as his left knee now feels as if it will buckle; pt also reporting increased tightness (spasticity) in LLE since this incident:  pt states he has not done any stretches since Monday     Pertinent History  None - very active    How long can you walk comfortably?  uses cane and RW household distance, RW for community distances    Patient Stated Goals  To be more independent at home and improve LE strength    Currently in Pain?  Other (Comment) "increased  stiffness" in bil. LE's with LLE instability           Pool temp. 87 degrees Pt used RW to amb. To pool steps; entered pool by negotiating steps with use of bil. Hand rails - step by step sequence Pt exited pool with use of bil. Hand rails with step ascension (step by step sequence) with significant assist of UE's for pulling body up onto step         Adult Aquatic Therapy - 11/13/17 1824      Aquatic Therapy Subjective   Subjective  Pt using RW to ambulate from pool side to steps for pool entrance       Treatment   Gait  Pt amb. in 3.5' - 4.5' depth of water using RUE support on pool deck as pt states his balance is not as good as it was prior to Sunday night (slept in recliner at hospital)              Exercises  Pt performed Lt hamstring stretch (runner's stretch ) x 60 sec hold x 1 reps for LLE stretching; Lt heel cord stretch 60 sec hold 1 rep ;  Pt performed standing Lt hip flexion, extension and abduction with Lt foot dorsiflexed  10 reps each using viscosity of water for  resistance.       Pt performed bil. Hamstring and heel cord stretches in pool - runner's stretch and heel cord stretch on each leg - 30 sec hold x 1 rep each leg Pt also performed hamstring stretches in standing -pt placed foot on pool wall, supported by PT's leg - pt performed 5 reps of contract/relax with 3 sec hold (each leg)  Pt gait trained approx 30' across pool in 4.5' depth of water x 2 reps; water walker (gait aid comprised of PVC tubes) used for gait training for assistance with balance 35' x 2 reps   Pt performed standing hip abduction, extension and flexion - each leg 10 reps each for strengthening and for improved SLS;  Buoyancy of water used for reducing weight of limb in attempt to increase speed of movement and also buoyancy used to off load body in water  Pt performed jumping with UE support prn - 10 reps; squats x 10 reps with minimal UE support to facilitate trunk control and LE  strengthening   Pt attempted jogging across pool (30') with use of noodle for assistance with balance; viscosity of water needed to help slow movement allowing for incr. Processing time during fall recovery practice  Pt reclined back in supine position - noodle placed under pt's arms for buoyancy - supported by PT - pt performed hip abduction/adduction x 10 reps; RLE and LLE single limb hip flexion and extension with moderate resistance provided by PT student observing aquatic therapy session Pt performed lower trunk rotation with LE's adducted - moving lower extremities right to left x 5 reps each;  Water current used for resistance and laminar flow used for assistance with this movement  Pt requires aquatic therapy for water to allow reduced gait deviations with reduced joint loading through buoyancy - unable to attempt higher level balance and gait activities on land due to risk of falling with LOB         PT Short Term Goals - 10/09/17 1344      PT SHORT TERM GOAL #1   Title  Pt will improve standing balance as indicated by increased BERG balance score to >/= 44/56    Baseline  40/56    Time  5    Period  Weeks    Status  Revised    Target Date  11/19/17      PT SHORT TERM GOAL #2   Title  Pt will participate in initial fitting and training of AFO for indoor/outdoor surfaces with cane    Time  5    Period  Weeks    Status  Revised    Target Date  11/19/17      PT SHORT TERM GOAL #3   Title  Pt will improve five time sit <> stand from arm chair to </= 15 seconds with supervision without use of UE to demonstrate increased functional LE strength    Baseline  16 seconds without UE and min A from therapist    Time  5    Period  Weeks    Status  Revised    Target Date  11/19/17      PT SHORT TERM GOAL #4   Title  Pt will improve gait velocity with RW to >/= 1.8 ft/sec with cane and AFO    Baseline  1.3 with cane, no AFO    Time  5    Period  Weeks    Status  Revised     Target  Date  11/19/17      PT SHORT TERM GOAL #5   Title  Pt will demonstrate ability to ambulate safely carry baby carrier over indoor surfaces with cane and supervision and up/down stairs with one rail    Time  5    Period  Weeks    Status  New    Target Date  11/19/17        PT Long Term Goals - 10/09/17 1727      PT LONG TERM GOAL #1   Title  Pt will be independent with balance and LE strengthening HEP    Time  10    Period  Weeks    Status  Revised    Target Date  12/26/17      PT LONG TERM GOAL #2   Title  Pt will improve standing balance and BERG >/= 50/56 to decrease falls risk    Time  10    Period  Weeks    Status  Revised    Target Date  12/26/17      PT LONG TERM GOAL #3   Title  Pt will improve gait velocity to >/= 2.5 ft/sec with appropriate AFO/Bioness and LRAD or no AD    Time  10    Period  Weeks    Status  Revised    Target Date  12/26/17      PT LONG TERM GOAL #4   Title  Pt will ambulate x 1000 over outdoor surfaces (paved and grassy) MOD I with most appropriate AFO and LRAD or no AD; will negotiate 12 stairs, one rails, alternating sequence MOD I    Time  10    Period  Weeks    Status  Revised    Target Date  12/26/17      PT LONG TERM GOAL #5   Title  Pt will demonstrate ability to transition floor <> stand MOD I and perform infant care activities (carry, load carrier, push stroller, in the floor to play or change diaper, etc) with AFO and LRAD/or no AD    Time  10    Period  Weeks    Status  New    Target Date  12/26/17      PT LONG TERM GOAL #6   Title  Pt will report being able to stand on sidelines at football practice and game with AFO and LRAD/or no AD with intermittent seated rest breaks and move away from players quickly without LOB    Time  10    Period  Weeks    Status  New    Target Date  12/26/17            Plan - 11/13/17 1830    Clinical Impression Statement  Pt's balance and LLE functional use was decreased today  compared to status noted during previous aquatic PT session on 10-30-17; pt attributes decline in function to having slept in recliner at hospital on Sunday night while his wife was in labor.  LLE noted to have clonus with weight bearing activities in pool, and some clonus was noted in RLE.  Pt also reported that his balance was not as good today and requested use of pool noodle for UE support and assistance with gait training across pool.  Pt stated that LLE felt "very tight" after aquatic PT session.  Rehab Potential  Good    PT Frequency  3x / week    PT Duration  Other (comment)    PT Treatment/Interventions  ADLs/Self Care Home Management;Cryotherapy;Electrical Stimulation;Moist Heat;DME Instruction;Gait Network engineer;Therapeutic activities;Functional mobility training;Therapeutic exercise;Balance training;Neuromuscular re-education;Patient/family education;Orthotic Fit/Training;Passive range of motion;Taping;Aquatic Therapy    PT Next Visit Plan   *update HEP to change to prone quad stretch.  Dr Terrace Arabia plans to botox - revisit AFO after Botox.  Bioness on L quad and DF while performing WB knee stability exercises in flexion; bioness as needed (treadmill, gait without device, with device gait outdoors, curbs, ramps); incorporate infant care activities with gait during session.  Continue LE strengthening, agility training.    Consulted and Agree with Plan of Care  Patient       Patient will benefit from skilled therapeutic intervention in order to improve the following deficits and impairments:  Abnormal gait, Decreased balance, Decreased coordination, Decreased strength, Difficulty walking, Increased muscle spasms, Impaired sensation, Pain, Impaired tone, Decreased range of motion  Visit Diagnosis: Other  abnormalities of gait and mobility  Unsteadiness  Other symptoms and signs involving the nervous system     Problem List Patient Active Problem List   Diagnosis Date Noted  . Paraplegia (HCC) 11/07/2017  . Quadriplegia, unspecified (HCC) 11/07/2017  . Leukocytosis   . Neuropathic pain   . Drug induced constipation   . Urinary retention   . Class 1 obesity due to excess calories with serious comorbidity and body mass index (BMI) of 33.0 to 33.9 in adult   . Myelopathy (HCC) 08/01/2017  . Thoracic spinal stenosis 07/31/2017    Kary Kos, PT 11/13/2017, 6:39 PM  [facility] [address] [place], [place], [address] Phone: [phone]   Fax:  [phone]  Name: KENTRAIL SHEW MRN: [id number] Date of Birth: Dec 20, 1985

## 2017-11-15 ENCOUNTER — Encounter: Payer: Self-pay | Admitting: Rehabilitative and Restorative Service Providers"

## 2017-11-15 ENCOUNTER — Telehealth: Payer: Self-pay | Admitting: Rehabilitative and Restorative Service Providers"

## 2017-11-15 ENCOUNTER — Ambulatory Visit: Payer: 59 | Admitting: Rehabilitative and Restorative Service Providers"

## 2017-11-15 DIAGNOSIS — R29818 Other symptoms and signs involving the nervous system: Secondary | ICD-10-CM

## 2017-11-15 DIAGNOSIS — R2681 Unsteadiness on feet: Secondary | ICD-10-CM | POA: Diagnosis not present

## 2017-11-15 DIAGNOSIS — G8222 Paraplegia, incomplete: Secondary | ICD-10-CM | POA: Diagnosis not present

## 2017-11-15 DIAGNOSIS — R2689 Other abnormalities of gait and mobility: Secondary | ICD-10-CM

## 2017-11-15 DIAGNOSIS — M6281 Muscle weakness (generalized): Secondary | ICD-10-CM

## 2017-11-15 DIAGNOSIS — R208 Other disturbances of skin sensation: Secondary | ICD-10-CM | POA: Diagnosis not present

## 2017-11-15 NOTE — Telephone Encounter (Signed)
PT faxed today's [facility] (Dr. Yetta BarreJones) and also called to leave message on clinical line with CMA.    PT requested earlier visit for patient (scheduled 9/6) due to worsening spasticity, weakness and initiation of urination this week.    PT provided patient's cell # if they could schedule him sooner.  Desaree Downen, PT

## 2017-11-15 NOTE — Therapy (Signed)
[facility] [address] [place], [place], [address] Phone: [phone]   Fax:  [phone]  Physical Therapy Treatment  Patient Details  Name: Scott Lane MRN: [id number] Date of Birth: 04/05/1986 Referring Provider: Maryla Morrow, MD   Encounter Date: 11/15/2017  PT End of Session - 11/15/17 0946    Visit Number  29    Number of Visits  47    Date for PT Re-Evaluation  12/26/17    Authorization Type   Family Deductible.  VL: [place]    PT Start Time  0810    PT Stop Time  0840    PT Time Calculation (min)  30 min    Equipment Utilized During Treatment  --   pool noodles used for flotation assistance   Activity Tolerance  Other (comment);Treatment limited secondary to medical complications (Comment)   modified plan for today's visit due to worsening symptoms   Behavior During Therapy  [facility] for tasks assessed/performed       Past Medical History:  Diagnosis Date  . Chicken pox     Past Surgical History:  Procedure Laterality Date  . LUMBAR LAMINECTOMY/DECOMPRESSION MICRODISCECTOMY N/A 08/01/2017   Procedure: Thoracic nine, ten, eleven, twelve laminectomy;  Surgeon: Tia Alert, MD;  Location: [facility] OR;  Service: Neurosurgery;  Laterality: N/A;  . none    . WISDOM TOOTH EXTRACTION     bilateral lower.    There were no vitals filed for this visit.  Subjective Assessment - 11/15/17 0809    Subjective  The patient reports that he slept in the recliner at the hospital (wife admitted for birth of child) and has worsening weakness and stiffness and increased tone in his legs.  He reports that he could barely walk with his walker while in the hospital and feels like he has taken 4 steps back.  He reports trembling in his leg.   He reports doing aquatic therapy on Wednesday and feels like it helped.      Patient Stated Goals  To be more independent at home and improve LE strength    Currently in Pain?  No/denies    increased stiffness, no pain                      [facility] Adult PT Treatment/Exercise - 11/15/17 4132      Ambulation/Gait   Ambulation/Gait  Yes    Ambulation/Gait Assistance  6: Modified independent (Device/Increase time)    Ambulation/Gait Assistance Details  The patient is walking with RW today x 230 feet, 115 feet with 1 episode of left toe drag requiring leaning on walker to recover.  The patient is at a slower pace today and is weight bearing through UEs during L stance phase of gait.     Ambulation Distance (Feet)  230 Feet    Assistive device  Rolling walker    Gait Pattern  Step-through pattern;Decreased step length - right;Decreased stance time - left;Decreased hip/knee flexion - left;Decreased dorsiflexion - left;Decreased weight shift to left;Left genu recurvatum    Ambulation Surface  Level;Indoor      Self-Care   Self-Care  Other Self-Care Comments    Other Self-Care Comments   PT and patient discussed recommendation for further medical evaluation of changing symptoms this week.  We discussed red flag signs in which to go to ED to include:  changes in bowel/bladder control, worsening motor control, changes in sensation.  Patient verbalizes understanding.  Exercises   Exercises  Other Exercises    Other Exercises   PT performed conservative treatment due to onset of increased spasticity, increased weakness, and report of "taking 4 steps backwards."                                           SEATED:  butterfly stretch for hip adductors with cues on decreased rounding of the spine x 30 seocnds x 4 reps.  Hamstring stretching with 30 second holds x 2 reps.  Physioball sitting with cues on postural stability adding marching with CGA, seated core stability with shoulder retraction x 10 reps.  Seated alternating knee extension emphasizing core contraction to decease posterior leaning.               PT Education - 11/15/17 0945    Education Details  education  on red flag signs in which he would need to be seen at ED due to change in status this week    Person(s) Educated  Patient    Methods  Explanation    Comprehension  Verbalized understanding       PT Short Term Goals - 10/09/17 1344      PT SHORT TERM GOAL #1   Title  Pt will improve standing balance as indicated by increased BERG balance score to >/= 44/56    Baseline  40/56    Time  5    Period  Weeks    Status  Revised    Target Date  11/19/17      PT SHORT TERM GOAL #2   Title  Pt will participate in initial fitting and training of AFO for indoor/outdoor surfaces with cane    Time  5    Period  Weeks    Status  Revised    Target Date  11/19/17      PT SHORT TERM GOAL #3   Title  Pt will improve five time sit <> stand from arm chair to </= 15 seconds with supervision without use of UE to demonstrate increased functional LE strength    Baseline  16 seconds without UE and min A from therapist    Time  5    Period  Weeks    Status  Revised    Target Date  11/19/17      PT SHORT TERM GOAL #4   Title  Pt will improve gait velocity with RW to >/= 1.8 ft/sec with cane and AFO    Baseline  1.3 with cane, no AFO    Time  5    Period  Weeks    Status  Revised    Target Date  11/19/17      PT SHORT TERM GOAL #5   Title  Pt will demonstrate ability to ambulate safely carry baby carrier over indoor surfaces with cane and supervision and up/down stairs with one rail    Time  5    Period  Weeks    Status  New    Target Date  11/19/17        PT Long Term Goals - 10/09/17 1727      PT LONG TERM GOAL #1   Title  Pt will be independent with balance and LE strengthening HEP    Time  10    Period  Weeks    Status  Revised    Target  Date  12/26/17      PT LONG TERM GOAL #2   Title  Pt will improve standing balance and BERG >/= 50/56 to decrease falls risk    Time  10    Period  Weeks    Status  Revised    Target Date  12/26/17      PT LONG TERM GOAL #3   Title  Pt will  improve gait velocity to >/= 2.5 ft/sec with appropriate AFO/Bioness and LRAD or no AD    Time  10    Period  Weeks    Status  Revised    Target Date  12/26/17      PT LONG TERM GOAL #4   Title  Pt will ambulate x 1000 over outdoor surfaces (paved and grassy) MOD I with most appropriate AFO and LRAD or no AD; will negotiate 12 stairs, one rails, alternating sequence MOD I    Time  10    Period  Weeks    Status  Revised    Target Date  12/26/17      PT LONG TERM GOAL #5   Title  Pt will demonstrate ability to transition floor <> stand MOD I and perform infant care activities (carry, load carrier, push stroller, in the floor to play or change diaper, etc) with AFO and LRAD/or no AD    Time  10    Period  Weeks    Status  New    Target Date  12/26/17      PT LONG TERM GOAL #6   Title  Pt will report being able to stand on sidelines at football practice and game with AFO and LRAD/or no AD with intermittent seated rest breaks and move away from players quickly without LOB    Time  10    Period  Weeks    Status  New    Target Date  12/26/17            Plan - 11/15/17 1610    Clinical Impression Statement  The patient arrived today reporting a change in status.  He noted increased muscle spasticity earlier in the week and continues with increased stiffness in the left leg.  He has increased weakness noting a greater sensation the leg will buckle.    He denies the sensation of electric shock that he felt upon initial injury.  He does note increased delay during urination taking a longer time to initiate.  He denies bowel changes.   The patient is walking at a slower pace moving from use of SPC to the RW.  PT recommending he seek further medical attention at MD office (Dr. Yetta Barre, neurosurgeon).      PT Treatment/Interventions  ADLs/Self Care Home Management;Cryotherapy;Electrical Stimulation;Moist Heat;DME Instruction;Gait Network engineer;Therapeutic activities;Functional mobility  training;Therapeutic exercise;Balance training;Neuromuscular re-education;Patient/family education;Orthotic Fit/Training;Passive range of motion;Taping;Aquatic Therapy    PT Next Visit Plan  Patient to f/u with MD for further evaluation.      Consulted and Agree with Plan of Care  Patient       Patient will benefit from skilled therapeutic intervention in order to improve the following deficits and impairments:  Abnormal gait, Decreased balance, Decreased coordination, Decreased strength, Difficulty walking, Increased muscle spasms, Impaired sensation, Pain, Impaired tone, Decreased range of motion  Visit Diagnosis: Other abnormalities of gait and mobility  Unsteadiness  Other symptoms and signs involving the nervous system  Muscle weakness (generalized)     Problem List Patient Active Problem List  Diagnosis Date Noted  . Paraplegia (HCC) 11/07/2017  . Quadriplegia, unspecified (HCC) 11/07/2017  . Leukocytosis   . Neuropathic pain   . Drug induced constipation   . Urinary retention   . Class 1 obesity due to excess calories with serious comorbidity and body mass index (BMI) of 33.0 to 33.9 in adult   . Myelopathy (HCC) 08/01/2017  . Thoracic spinal stenosis 07/31/2017    Emelina Hinch, PT 11/15/2017, 9:47 AM  [facility] [address] [place], [place], [address] Phone: [phone]   Fax:  [phone]  Name: Scott Lane MRN: [id number] Date of Birth: [birth year]

## 2017-11-20 ENCOUNTER — Telehealth: Payer: Self-pay | Admitting: Neurology

## 2017-11-20 ENCOUNTER — Ambulatory Visit: Payer: 59 | Admitting: Physical Therapy

## 2017-11-20 NOTE — Telephone Encounter (Signed)
I called to check status of eligibility and spoke with Gerri. He is covered and codes do not require PA. UJW#[id number].

## 2017-11-21 ENCOUNTER — Encounter: Payer: Self-pay | Admitting: Physical Therapy

## 2017-11-21 ENCOUNTER — Ambulatory Visit: Payer: 59 | Admitting: Physical Therapy

## 2017-11-21 DIAGNOSIS — R208 Other disturbances of skin sensation: Secondary | ICD-10-CM

## 2017-11-21 DIAGNOSIS — R2689 Other abnormalities of gait and mobility: Secondary | ICD-10-CM

## 2017-11-21 DIAGNOSIS — M6281 Muscle weakness (generalized): Secondary | ICD-10-CM

## 2017-11-21 DIAGNOSIS — R29818 Other symptoms and signs involving the nervous system: Secondary | ICD-10-CM | POA: Diagnosis not present

## 2017-11-21 DIAGNOSIS — G8222 Paraplegia, incomplete: Secondary | ICD-10-CM | POA: Diagnosis not present

## 2017-11-21 DIAGNOSIS — R2681 Unsteadiness on feet: Secondary | ICD-10-CM

## 2017-11-21 NOTE — Therapy (Signed)
[facility] [address] [place], [place], [address] Phone: [phone]   Fax:  [phone]  Physical Therapy Treatment  Patient Details  Name: Scott Lane MRN: [id number] Date of Birth: Aug 04, 1985 Referring Provider: Delice Lesch, MD   Encounter Date: 11/21/2017  PT End of Session - 11/21/17 0925    Visit Number  30    Number of Visits  31    Date for PT Re-Evaluation  12/26/17    Authorization Type  [place] Family Deductible.  VL: MN    PT Start Time  0805    PT Stop Time  0900    PT Time Calculation (min)  55 min    Equipment Utilized During Treatment  --    Activity Tolerance  Patient tolerated treatment well    Behavior During Therapy  WFL for tasks assessed/performed       Past Medical History:  Diagnosis Date  . Chicken pox     Past Surgical History:  Procedure Laterality Date  . LUMBAR LAMINECTOMY/DECOMPRESSION MICRODISCECTOMY N/A 08/01/2017   Procedure: Thoracic nine, ten, eleven, twelve laminectomy;  Surgeon: Eustace Moore, MD;  Location: [place];  Service: Neurosurgery;  Laterality: N/A;  . none    . WISDOM TOOTH EXTRACTION     bilateral lower.    There were no vitals filed for this visit.  Subjective Assessment - 11/21/17 0805    Subjective  Pt reports feeling better today; reports that it varies, some days the LLE is more stiff but sometimes the leg feels very loose.  Has not heard from his surgeon and has not called to follow up.  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    Pertinent History  None - very active    Limitations  Standing;Walking    How long can you stand comfortably?  8-10 minutes    How long can you walk comfortably?  uses cane and RW household distance, RW for community distances    Diagnostic tests  MRI     Patient Stated Goals  To be more independent at home and  improve LE strength    Currently in Pain?  Yes    Pain Score  2     Pain Location  Back    Pain Orientation  Posterior;Lower    Pain Type  Acute pain         [facility] PT Assessment - 11/21/17 0825      Ambulation/Gait   Stairs  Yes    Stairs Assistance  4: Min assist    Stair Management Technique  One rail Left;Alternating pattern;Forwards;With cane    Number of Stairs  8    Height of Stairs  6      Standardized Balance Assessment   Standardized Balance Assessment  Five Times Sit to Stand;10 meter walk test    Five times sit to stand comments   10.40 with bilat UE support and equal WB through LE; 15.69 seconds without use of UE and supervision from therapist    10 Meter Walk  24 seconds with cane, no AFO or 1.36 ft/sec      Berg Balance Test   Sit to Stand  Able to stand  independently using hands    Standing Unsupported  Able to stand safely 2 minutes    Sitting with Back Unsupported but Feet Supported on Floor  or Stool  Able to sit safely and securely 2 minutes    Stand to Sit  Controls descent by using hands    Transfers  Able to transfer safely, definite need of hands    Standing Unsupported with Eyes Closed  Able to stand 10 seconds with supervision    Standing Ubsupported with Feet Together  Able to place feet together independently and stand for 1 minute with supervision    From Standing, Reach Forward with Outstretched Arm  Can reach confidently >25 cm (10")    From Standing Position, Pick up Object from [place] to pick up shoe safely and easily    From Standing Position, Turn to Look Behind Over each Shoulder  Looks behind from both sides and weight shifts well    Turn 360 Degrees  Needs close supervision or verbal cueing    Standing Unsupported, Alternately Place Feet on Step/Stool  Able to complete 4 steps without aid or supervision    Standing Unsupported, One Foot in Front  Able to plae foot ahead of the other independently and hold 30 seconds    Standing on One  Leg  Able to lift leg independently and hold 5-10 seconds    Total Score  44    Berg comment:  44/56                   [facility] Adult PT Treatment/Exercise - 11/21/17 0825      Ambulation/Gait   Ambulation/Gait  Yes    Ambulation/Gait Assistance  6: Modified independent (Device/Increase time)    Ambulation/Gait Assistance Details  pt returned to use of cane today; still demonstrates decreased hip and knee flexion of LLE during swing phase and decreased weight shift and stance time on LLE     Ambulation Distance (Feet)  115 Feet    Assistive device  Straight cane    Gait Pattern  Step-through pattern;Decreased step length - left;Decreased stance time - left;Decreased stride length;Decreased hip/knee flexion - left;Decreased dorsiflexion - left;Decreased weight shift to left;Wide base of support    Ambulation Surface  Level;Indoor    Stairs Assistance Details (indicate cue type and reason)  With supervision pt continues to demonstrate decreased weight shift over LLE causing knee buckling when pushing up through LLE; with second set of stairs therapist assisted with weight shift forwards and to the L over LLE for increased extension activation; also required assistance for increased weight shift and knee stability during eccentric contraction when descending on LLE      Therapeutic Activites    Therapeutic Activities  Other Therapeutic Activities    Other Therapeutic Activities  continued to discuss progress with patient and variables that can affect progress as well as variability between patients.  Patient asking if the areas in his neck might be affecting LE; deferred overall impression to surgeon but discussed UE symptoms to be aware of if cervical or upper thoracic spine is involved (weakness, sensory changes, radicular pain - pt in the past has mentioned he drops items frequently with one hand).  Pt also asking if the botox injection would help him trust his LLE more; discussed purpose  of botox to improve ROM and fluidity of movement through decrease in hypertonicity but it may not improve his "confidence" with putting more weight through it.  Pt also asking if he should wear a brace on his L knee for stability.  Discussed role of brace for ongoing instability and intermittent use when appropriate but also discussed  sequelae of prolonged use of bracing and mm atrophy; discussed training of muscle groups to provide stability first before use of bracing.        Knee/Hip Exercises: Stretches   Sports administrator  Right;Left;3 reps;30 seconds    Sports administrator Limitations  child's pose forwards and then with R/L bias    Press photographer  Left;3 reps;30 seconds    Gastroc Stretch Limitations  standing with LLE back, RLE forwards, UE on mat pressing L heel back into ground    Other Knee/Hip Stretches  Seated hamstring stretch with heel on stool and strap around foot, 3 sets x 30 seconds.  Also performed modified "down dog" for low back and bilat hamstring stretch 2 x 30      Knee/Hip Exercises: Standing   Other Standing Knee Exercises  Modified squat at mat with UE support on mat peforming weight shift to L and maintaining while raising RLE up x 5-6 seconds at a time.  Then performed modified mountain climbers with UE support on mat x 2 sets x 10 reps with second set performing at a faster speed of alternating movement.               PT Education - 11/21/17 0925    Education provided  Yes    Education Details  see TA, progress made and areas to continue to focus on    Person(s) Educated  Patient    Methods  Explanation    Comprehension  Verbalized understanding       PT Short Term Goals - 11/21/17 0824      PT SHORT TERM GOAL #1   Title  Pt will improve standing balance as indicated by increased BERG balance score to >/= 44/56    Baseline  44/56    Time  5    Period  Weeks    Status  Achieved      PT SHORT TERM GOAL #2   Title  Pt will participate in initial fitting and  training of AFO for indoor/outdoor surfaces with cane    Baseline  performed assessment with bracing but pt is not open to using right now    Time  5    Period  Weeks    Status  Achieved      PT SHORT TERM GOAL #3   Title  Pt will improve five time sit <> stand from arm chair to </= 15 seconds with supervision without use of UE to demonstrate increased functional LE strength    Baseline  --    Time  5    Period  Weeks    Status  Achieved      PT SHORT TERM GOAL #4   Title  Pt will improve gait velocity with RW to >/= 1.8 ft/sec with cane and AFO    Baseline  1.3 with cane, no AFO    Time  5    Period  Weeks    Status  Not Met      PT SHORT TERM GOAL #5   Title  Pt will demonstrate ability to ambulate safely carry baby carrier over indoor surfaces with cane and supervision and up/down stairs with one rail    Baseline  min A up/down stairs with one rail without carrying carrier    Time  5    Period  Weeks    Status  Not Met        PT Long Term Goals - 10/09/17 1727  PT LONG TERM GOAL #1   Title  Pt will be independent with balance and LE strengthening HEP    Time  10    Period  Weeks    Status  Revised    Target Date  12/26/17      PT LONG TERM GOAL #2   Title  Pt will improve standing balance and BERG >/= 50/56 to decrease falls risk    Time  10    Period  Weeks    Status  Revised    Target Date  12/26/17      PT LONG TERM GOAL #3   Title  Pt will improve gait velocity to >/= 2.5 ft/sec with appropriate AFO/Bioness and LRAD or no AD    Time  10    Period  Weeks    Status  Revised    Target Date  12/26/17      PT LONG TERM GOAL #4   Title  Pt will ambulate x 1000 over outdoor surfaces (paved and grassy) MOD I with most appropriate AFO and LRAD or no AD; will negotiate 12 stairs, one rails, alternating sequence MOD I    Time  10    Period  Weeks    Status  Revised    Target Date  12/26/17      PT LONG TERM GOAL #5   Title  Pt will demonstrate ability to  transition floor <> stand MOD I and perform infant care activities (carry, load carrier, push stroller, in the floor to play or change diaper, etc) with AFO and LRAD/or no AD    Time  10    Period  Weeks    Status  New    Target Date  12/26/17      PT LONG TERM GOAL #6   Title  Pt will report being able to stand on sidelines at football practice and game with AFO and LRAD/or no AD with intermittent seated rest breaks and move away from players quickly without LOB    Time  10    Period  Weeks    Status  New    Target Date  12/26/17            Plan - 11/21/17 1251    Clinical Impression Statement  Pt has returned to previous baseline and demonstrates improvement in functional LE strength for sit <> stand and improvements in static and dynamic standing balance as indicated by five times sit to stand and BERG.  Gait velocity remains the same.  Continues to demonstrate decreased weight shift to LLE during gait and stair negotiation.  Had a long discussion with pt regarding symptoms, progress, use of bracing and purpose of botox.  Will continue to monitor and progress towards LTG as pt is able to tolerate.  Pt has met 3/5 STG.    PT Treatment/Interventions  ADLs/Self Care Home Management;Cryotherapy;Electrical Stimulation;Moist Heat;DME Instruction;Gait Scientist, forensic;Therapeutic activities;Functional mobility training;Therapeutic exercise;Balance training;Neuromuscular re-education;Patient/family education;Orthotic Fit/Training;Passive range of motion;Taping;Aquatic Therapy    PT Next Visit Plan  revise HEP; continue to focus on weight shift and WB through LLE for gait/stairs, narrow BOS.  Bioness LLE    PT Home Exercise Plan  [id number]      Recommended Other Services  Hold PT 48 hours after Botox to let medication take effect??    Consulted and Agree with Plan of Care  Patient       Patient will benefit from skilled therapeutic intervention in order to improve the following  deficits  and impairments:  Abnormal gait, Decreased balance, Decreased coordination, Decreased strength, Difficulty walking, Increased muscle spasms, Impaired sensation, Pain, Impaired tone, Decreased range of motion  Visit Diagnosis: Other abnormalities of gait and mobility  Unsteadiness  Other symptoms and signs involving the nervous system  Muscle weakness (generalized)  Other disturbances of skin sensation  Paraplegia, incomplete ([place])     Problem List Patient Active Problem List   Diagnosis Date Noted  . Paraplegia ([place]) 11/07/2017  . Quadriplegia, unspecified ([place]) 11/07/2017  . Leukocytosis   . Neuropathic pain   . Drug induced constipation   . Urinary retention   . Class 1 obesity due to excess calories with serious comorbidity and body mass index (BMI) of 33.0 to 33.9 in adult   . Myelopathy ([place]) 08/01/2017  . Thoracic spinal stenosis 07/31/2017    Rico Junker, PT, DPT 11/21/17    1:21 PM    [facility] [address] [place], [place], [address] Phone: [phone]   Fax:  [phone]  Name: Scott Lane MRN: [id number] Date of Birth: 01/31/1986

## 2017-11-22 ENCOUNTER — Encounter: Payer: Self-pay | Admitting: Physical Therapy

## 2017-11-22 ENCOUNTER — Ambulatory Visit: Payer: 59 | Admitting: Physical Therapy

## 2017-11-22 DIAGNOSIS — R2689 Other abnormalities of gait and mobility: Secondary | ICD-10-CM | POA: Diagnosis not present

## 2017-11-22 DIAGNOSIS — G8222 Paraplegia, incomplete: Secondary | ICD-10-CM | POA: Diagnosis not present

## 2017-11-22 DIAGNOSIS — M6281 Muscle weakness (generalized): Secondary | ICD-10-CM

## 2017-11-22 DIAGNOSIS — R2681 Unsteadiness on feet: Secondary | ICD-10-CM | POA: Diagnosis not present

## 2017-11-22 DIAGNOSIS — R29818 Other symptoms and signs involving the nervous system: Secondary | ICD-10-CM | POA: Diagnosis not present

## 2017-11-22 DIAGNOSIS — R208 Other disturbances of skin sensation: Secondary | ICD-10-CM | POA: Diagnosis not present

## 2017-11-22 NOTE — Therapy (Addendum)
[facility] [address] [place], [place], [address] Phone: [phone]   Fax:  [phone]  Physical Therapy Treatment  Patient Details  Name: Scott Lane MRN: [id number] Date of Birth: 12/25/1985 Referring Provider: Delice Lesch, MD   Encounter Date: 11/22/2017  PT End of Session - 11/22/17 1246    Visit Number  31    Number of Visits  29    Date for PT Re-Evaluation  12/26/17    Authorization Type  [employer] Ded[employer]ible.  VL: MN    PT Start Time  [phone]    PT Stop Time  0930    PT Time Calculation (min)  38 min    Equipment Utilized During Treatment  Other (comment)   Bioness   Activity Tolerance  Patient tolerated treatment well    Behavior During Therapy  WFL for tasks assessed/performed       Past Medical History:  Diagnosis Date  . Chicken pox     Past Surgical History:  Procedure Laterality Date  . LUMBAR LAMINECTOMY/DECOMPRESSION MICRODISCECTOMY N/A 08/01/2017   Procedure: Thoracic nine, ten, eleven, twelve laminectomy;  Surgeon: Eustace Moore, MD;  Location: [facility];  Service: Neurosurgery;  Laterality: N/A;  . none    . WISDOM TOOTH EXTRACTION     bilateral lower.    There were no vitals filed for this visit.  Subjective Assessment - 11/22/17 0855    Subjective  Pt had training yesterday and had to sit for multiple hours, was stiff afterwards.  No pain today.  Runs out of Baclofen this weekend so may have to take Zanaflex but it made him very lethargic    Pertinent History  None - very active    Limitations  Standing;Walking    How long can you stand comfortably?  8-10 minutes    How long can you walk comfortably?  uses cane and RW household distance, RW for community distances    Diagnostic tests  MRI     Patient Stated Goals  To be more independent at home and improve LE strength    Currently in Pain?  No/denies                       [facility] Adult PT Treatment/Exercise  - 11/22/17 1233      Ambulation/Gait   Stairs  Yes    Stairs Assistance  4: Min assist    Stairs Assistance Details (indicate cue type and reason)  with Bioness in gait mode and then training mode for improved timing of activation; performed first with bilat rails and then with one rail and cane.  Therapist continued to provide facilitation for full anterior and L lateral weight shift onto LLE    Stair Management Technique  Two rails;One rail Left;Alternating pattern;Forwards;With cane;Other (comment)   with Bioness   Number of Stairs  8    Height of Stairs  6      Neuro Re-ed    Neuro Re-ed Details   With Bioness in training mode continued NMR for LLE in closed chain with sustained squat with lifting R heel x 5 reps with bilat UE support, 3 sets of alternating mountain climbers x 15 seconds, sustained squat with side stepping to L and R x 3 reps in // bars and gait without use of UE support (but in // bars) x 3 laps with focus on increased weight shift and stance time on LLE (Bioness in gait mode for  last activity)      Modalities   Modalities  Electrical engineer Stimulation Location  L anterior tibialis and quad    Electrical Stimulation Action  ankle DF and knee extension    Electrical Stimulation Parameters  see saved parameters in Tablet 1; steering electrode    Electrical Stimulation Goals  Strength;Neuromuscular facilitation               PT Short Term Goals - 11/21/17 0824      PT SHORT TERM GOAL #1   Title  Pt will improve standing balance as indicated by increased BERG balance score to >/= 44/56    Baseline  44/56    Time  5    Period  Weeks    Status  Achieved      PT SHORT TERM GOAL #2   Title  Pt will participate in initial fitting and training of AFO for indoor/outdoor surfaces with cane    Baseline  performed assessment with bracing but pt is not open to using right now    Time  5    Period  Weeks    Status   Achieved      PT SHORT TERM GOAL #3   Title  Pt will improve five time sit <> stand from arm chair to </= 15 seconds with supervision without use of UE to demonstrate increased functional LE strength    Baseline  --    Time  5    Period  Weeks    Status  Achieved      PT SHORT TERM GOAL #4   Title  Pt will improve gait velocity with RW to >/= 1.8 ft/sec with cane and AFO    Baseline  1.3 with cane, no AFO    Time  5    Period  Weeks    Status  Not Met      PT SHORT TERM GOAL #5   Title  Pt will demonstrate ability to ambulate safely carry baby carrier over indoor surfaces with cane and supervision and up/down stairs with one rail    Baseline  min A up/down stairs with one rail without carrying carrier    Time  5    Period  Weeks    Status  Not Met        PT Long Term Goals - 10/09/17 1727      PT LONG TERM GOAL #1   Title  Pt will be independent with balance and LE strengthening HEP    Time  10    Period  Weeks    Status  Revised    Target Date  12/26/17      PT LONG TERM GOAL #2   Title  Pt will improve standing balance and BERG >/= 50/56 to decrease falls risk    Time  10    Period  Weeks    Status  Revised    Target Date  12/26/17      PT LONG TERM GOAL #3   Title  Pt will improve gait velocity to >/= 2.5 ft/sec with appropriate AFO/Bioness and LRAD or no AD    Time  10    Period  Weeks    Status  Revised    Target Date  12/26/17      PT LONG TERM GOAL #4   Title  Pt will ambulate x 1000 over outdoor surfaces (paved and grassy) MOD I  with most appropriate AFO and LRAD or no AD; will negotiate 12 stairs, one rails, alternating sequence MOD I    Time  10    Period  Weeks    Status  Revised    Target Date  12/26/17      PT LONG TERM GOAL #5   Title  Pt will demonstrate ability to transition floor <> stand MOD I and perform infant care activities (carry, load carrier, push stroller, in the floor to play or change diaper, etc) with AFO and LRAD/or no AD    Time   10    Period  Weeks    Status  New    Target Date  12/26/17      PT LONG TERM GOAL #6   Title  Pt will report being able to stand on sidelines at football practice and game with AFO and LRAD/or no AD with intermittent seated rest breaks and move away from players quickly without LOB    Time  10    Period  Weeks    Status  New    Target Date  12/26/17            Plan - 11/22/17 1246    Clinical Impression Statement  Continued to focus on NMR for LLE stability, strengthening and timing of activation during gait and stair negotiation returning to use of Bioness on L quad and anterior tibialis for functional electrical stimulation.  Pt continues to have increased difficulty with sustained weight shift and activation of L quad, weight shifting to L and eccentric control of L.  Will continue to address and progress towards LTG.    PT Treatment/Interventions  ADLs/Self Care Home Management;Cryotherapy;Electrical Stimulation;Moist Heat;DME Instruction;Gait Scientist, forensic;Therapeutic activities;Functional mobility training;Therapeutic exercise;Balance training;Neuromuscular re-education;Patient/family education;Orthotic Fit/Training;Passive range of motion;Taping;Aquatic Therapy    PT Next Visit Plan  revise HEP; continue to focus on weight shift and WB through LLE for gait/stairs, narrow BOS.  Bioness LLE with treadmill    PT Home Exercise Plan  [email or web address]      Consulted and Agree with Plan of Care  Patient       Patient will benefit from skilled therapeutic intervention in order to improve the following deficits and impairments:  Abnormal gait, Decreased balance, Decreased coordination, Decreased strength, Difficulty walking, Increased muscle spasms, Impaired sensation, Pain, Impaired tone, Decreased range of motion  Visit Diagnosis: Other abnormalities of gait and mobility  Unsteadiness  Other symptoms and signs involving the nervous system  Muscle weakness  (generalized)  Other disturbances of skin sensation  Paraplegia, incomplete ([place])     Problem List Patient Active Problem List   Diagnosis Date Noted  . Paraplegia ([place]) 11/07/2017  . Quadriplegia, unspecified ([facility]) 11/07/2017  . Leukocytosis   . Neuropathic pain   . Drug induced constipation   . Urinary retention   . Class 1 obesity due to excess calories with serious comorbidity and body mass index (BMI) of 33.0 to 33.9 in adult   . Myelopathy ([facility]) 08/01/2017  . Thoracic spinal stenosis 07/31/2017   Rico Junker, PT, DPT 11/22/17    12:54 PM    [facility] [address] [place], [place], [address] Phone: [phone]   Fax:  [phone]  Name: JOLON DEGANTE MRN: [id number] Date of Birth: January 07, 1986

## 2017-11-27 ENCOUNTER — Ambulatory Visit: Payer: 59 | Admitting: Physical Therapy

## 2017-11-27 ENCOUNTER — Encounter: Payer: Self-pay | Admitting: Physical Therapy

## 2017-11-27 DIAGNOSIS — R2681 Unsteadiness on feet: Secondary | ICD-10-CM | POA: Diagnosis not present

## 2017-11-27 DIAGNOSIS — R2689 Other abnormalities of gait and mobility: Secondary | ICD-10-CM

## 2017-11-27 DIAGNOSIS — R208 Other disturbances of skin sensation: Secondary | ICD-10-CM

## 2017-11-27 DIAGNOSIS — M6281 Muscle weakness (generalized): Secondary | ICD-10-CM

## 2017-11-27 DIAGNOSIS — R29818 Other symptoms and signs involving the nervous system: Secondary | ICD-10-CM

## 2017-11-27 DIAGNOSIS — G8222 Paraplegia, incomplete: Secondary | ICD-10-CM

## 2017-11-27 NOTE — Therapy (Signed)
[facility] [address] [place], [place], [address] Phone: [phone]   Fax:  [phone]  Physical Therapy Treatment  Patient Details  Name: Scott Lane MRN: [id number] Date of Birth: 08/07/1985 Referring Provider: Delice Lesch, MD   Encounter Date: 11/27/2017  PT End of Session - 11/27/17 5701    Visit Number  32    Number of Visits  23    Date for PT Re-Evaluation  12/26/17    Authorization Type  [employer] Deductible.  VL: MN    PT Start Time  1454   arrived late   PT Stop Time  1535    PT Time Calculation (min)  41 min    Equipment Utilized During Treatment  Other (comment)   Bioness   Activity Tolerance  Patient tolerated treatment well    Behavior During Therapy  WFL for tasks assessed/performed       Past Medical History:  Diagnosis Date  . Chicken pox     Past Surgical History:  Procedure Laterality Date  . LUMBAR LAMINECTOMY/DECOMPRESSION MICRODISCECTOMY N/A 08/01/2017   Procedure: Thoracic nine, ten, eleven, twelve laminectomy;  Surgeon: Eustace Moore, MD;  Location: [facility];  Service: Neurosurgery;  Laterality: N/A;  . none    . WISDOM TOOTH EXTRACTION     bilateral lower.    There were no vitals filed for this visit.  Subjective Assessment - 11/27/17 1456    Subjective  No issues to report over the weekend.  Coming from work today; using cane only.  Still having a little stiffness in L but was able to get Baclofen refilled.    Pertinent History  None - very active    Limitations  Standing;Walking    How long can you stand comfortably?  8-10 minutes    How long can you walk comfortably?  uses cane and RW household distance, RW for community distances    Diagnostic tests  MRI     Patient Stated Goals  To be more independent at home and improve LE strength    Currently in Pain?  No/denies                       [facility] Adult PT Treatment/Exercise - 11/27/17 1635       Modalities   Modalities  Electrical Stimulation      Electrical Stimulation   Electrical Stimulation Location  L anterior tibialis and quad    Electrical Stimulation Action  ankle DF and knee extension    Electrical Stimulation Parameters  see saved parameters in Tablet 1; steering electrode    Electrical Stimulation Goals  Strength;Neuromuscular facilitation       Access Code: [email or web address]  URL: [email or web address]  Date: 11/27/2017  Prepared by: Misty Stanley   Exercises   Supine Bridge with toes lifted- 10 reps - 1 sets - 1x daily - 7x weekly  Marching Bridge - 5 reps - 2 sets - 1x daily - 7x weekly  Sidelying Reverse Clamshell - 10 reps - 1 sets - 1x daily - 7x weekly  Seated Hamstring Stretch with Chair - 3 sets - 30 second hold - 1x daily - 7x weekly  Prone Hip Extension with Bent Knee - 10 reps - 1 sets - 1x daily - 7x weekly  Gastroc Stretch on Wall - 3 sets - 30 second hold - 1x daily - 7x weekly  Standing Quadriceps Stretch - 10 reps - 3 sets - 1x  daily - 7x weekly        PT Education - 11/27/17 1637    Education provided  Yes    Education Details  updated HEP; at end of session pt reported numbness in LLE - advised not to perform prone exercises for now    Person(s) Educated  Patient    Methods  Explanation;Demonstration    Comprehension  Verbalized understanding;Returned demonstration       PT Short Term Goals - 11/21/17 0824      PT SHORT TERM GOAL #1   Title  Pt will improve standing balance as indicated by increased BERG balance score to >/= 44/56    Baseline  44/56    Time  5    Period  Weeks    Status  Achieved      PT SHORT TERM GOAL #2   Title  Pt will participate in initial fitting and training of AFO for indoor/outdoor surfaces with cane    Baseline  performed assessment with bracing but pt is not open to using right now    Time  5    Period  Weeks    Status  Achieved      PT SHORT TERM GOAL #3   Title  Pt will improve five  time sit <> stand from arm chair to </= 15 seconds with supervision without use of UE to demonstrate increased functional LE strength    Baseline  --    Time  5    Period  Weeks    Status  Achieved      PT SHORT TERM GOAL #4   Title  Pt will improve gait velocity with RW to >/= 1.8 ft/sec with cane and AFO    Baseline  1.3 with cane, no AFO    Time  5    Period  Weeks    Status  Not Met      PT SHORT TERM GOAL #5   Title  Pt will demonstrate ability to ambulate safely carry baby carrier over indoor surfaces with cane and supervision and up/down stairs with one rail    Baseline  min A up/down stairs with one rail without carrying carrier    Time  5    Period  Weeks    Status  Not Met        PT Long Term Goals - 10/09/17 1727      PT LONG TERM GOAL #1   Title  Pt will be independent with balance and LE strengthening HEP    Time  10    Period  Weeks    Status  Revised    Target Date  12/26/17      PT LONG TERM GOAL #2   Title  Pt will improve standing balance and BERG >/= 50/56 to decrease falls risk    Time  10    Period  Weeks    Status  Revised    Target Date  12/26/17      PT LONG TERM GOAL #3   Title  Pt will improve gait velocity to >/= 2.5 ft/sec with appropriate AFO/Bioness and LRAD or no AD    Time  10    Period  Weeks    Status  Revised    Target Date  12/26/17      PT LONG TERM GOAL #4   Title  Pt will ambulate x 1000 over outdoor surfaces (paved and grassy) MOD I with most appropriate AFO and LRAD  or no AD; will negotiate 12 stairs, one rails, alternating sequence MOD I    Time  10    Period  Weeks    Status  Revised    Target Date  12/26/17      PT LONG TERM GOAL #5   Title  Pt will demonstrate ability to transition floor <> stand MOD I and perform infant care activities (carry, load carrier, push stroller, in the floor to play or change diaper, etc) with AFO and LRAD/or no AD    Time  10    Period  Weeks    Status  New    Target Date  12/26/17       PT LONG TERM GOAL #6   Title  Pt will report being able to stand on sidelines at football practice and game with AFO and LRAD/or no AD with intermittent seated rest breaks and move away from players quickly without LOB    Time  10    Period  Weeks    Status  New    Target Date  12/26/17            Plan - 11/27/17 1640    Clinical Impression Statement  Focused on revision of current HEP to include stretching of all major mm groups on L and RLE - focusing mainly on LLE today with Bioness in training mode for increased mm activation - and exercises to focus on weight shifting/WB to LLE and increased knee control.  Following prone exercises (hamstring curl and hip extension with knee flexion) pt reported increased tightness in L gastoc and numbness - resolved within 1-2 minutes.  Advised pt to NOT perform prone exercises due to symptoms.  Will continue to assess and revise next session.    PT Treatment/Interventions  ADLs/Self Care Home Management;Cryotherapy;Electrical Stimulation;Moist Heat;DME Instruction;Gait Scientist, forensic;Therapeutic activities;Functional mobility training;Therapeutic exercise;Balance training;Neuromuscular re-education;Patient/family education;Orthotic Fit/Training;Passive range of motion;Taping;Aquatic Therapy    PT Next Visit Plan  continue to revise HEP- assess prone exercises - caused numbness in LLE; continue to focus on weight shift and WB through LLE for gait/stairs, narrow BOS.  Bioness LLE with treadmill    PT Home Exercise Plan  [email or web address]      Consulted and Agree with Plan of Care  Patient       Patient will benefit from skilled therapeutic intervention in order to improve the following deficits and impairments:  Abnormal gait, Decreased balance, Decreased coordination, Decreased strength, Difficulty walking, Increased muscle spasms, Impaired sensation, Pain, Impaired tone, Decreased range of motion  Visit Diagnosis: Other abnormalities of gait and  mobility  Unsteadiness  Other symptoms and signs involving the nervous system  Muscle weakness (generalized)  Other disturbances of skin sensation  Paraplegia, incomplete ([place])     Problem List Patient Active Problem List   Diagnosis Date Noted  . Paraplegia ([facility]) 11/07/2017  . Quadriplegia, unspecified ([facility]) 11/07/2017  . Leukocytosis   . Neuropathic pain   . Drug induced constipation   . Urinary retention   . Class 1 obesity due to excess calories with serious comorbidity and body mass index (BMI) of 33.0 to 33.9 in adult   . Myelopathy ([facility]) 08/01/2017  . Thoracic spinal stenosis 07/31/2017    Rico Junker, PT, DPT 11/27/17    4:46 PM    [facility] [address] [place], [place], [address] Phone: [phone]   Fax:  [phone]  Name: DARDAN SHELTON MRN: [id number] Date of Birth: 1985/06/26

## 2017-11-27 NOTE — Patient Instructions (Signed)
Access Code: [email or web address]  URL: [email or web address]  Date: 11/27/2017  Prepared by: Bufford LopeAudra Potter   Exercises  Sit to Stand without Arm Support - 10 reps - 2 sets - 1x daily - 7x weekly  Single Leg Sit to Stand with Arms Extended - 10 reps - 2 sets - 1x daily - 7x weekly  Supine Bridge - 10 reps - 1 sets - 1x daily - 7x weekly  Marching Bridge - 5 reps - 2 sets - 1x daily - 7x weekly  Sidelying Reverse Clamshell - 10 reps - 1 sets - 1x daily - 7x weekly  Seated Hamstring Stretch with Chair - 3 sets - 30 second hold - 1x daily - 7x weekly  Prone Hip Extension with Bent Knee - 10 reps - 1 sets - 1x daily - 7x weekly  Gastroc Stretch on Wall - 3 sets - 30 second hold - 1x daily - 7x weekly  Standing Quadriceps Stretch - 10 reps - 3 sets - 1x daily - 7x weekly

## 2017-11-28 ENCOUNTER — Ambulatory Visit: Payer: 59 | Admitting: Physical Therapy

## 2017-11-28 ENCOUNTER — Encounter: Payer: Self-pay | Admitting: Physical Therapy

## 2017-11-28 DIAGNOSIS — M6281 Muscle weakness (generalized): Secondary | ICD-10-CM

## 2017-11-28 DIAGNOSIS — R2681 Unsteadiness on feet: Secondary | ICD-10-CM

## 2017-11-28 DIAGNOSIS — R2689 Other abnormalities of gait and mobility: Secondary | ICD-10-CM

## 2017-11-28 DIAGNOSIS — R29818 Other symptoms and signs involving the nervous system: Secondary | ICD-10-CM

## 2017-11-28 DIAGNOSIS — G8222 Paraplegia, incomplete: Secondary | ICD-10-CM

## 2017-11-28 DIAGNOSIS — R208 Other disturbances of skin sensation: Secondary | ICD-10-CM

## 2017-11-28 NOTE — Patient Instructions (Signed)
Access Code: [email or web address]  URL: [email or web address]  Date: 11/28/2017  Prepared by: Bufford LopeAudra Brinkley Peet   Exercises  Sit to Stand without Arm Support - 10 reps - 2 sets - 1x daily - 7x weekly  Single Leg Sit to Stand with Arms Extended - 10 reps - 2 sets - 1x daily - 7x weekly  Supine Bridge - 10 reps - 1 sets - 1x daily - 7x weekly  Marching Bridge - 5 reps - 2 sets - 1x daily - 7x weekly  Sidelying Reverse Clamshell - 10 reps - 1 sets - 1x daily - 7x weekly  Quadruped Alternating Leg Extensions - 10 reps - 3 sets - 1x daily - 7x weekly  Seated Hamstring Stretch with Chair - 3 sets - 30 second hold - 1x daily - 7x weekly  Gastroc Stretch on Wall - 3 sets - 30 second hold - 1x daily - 7x weekly  Standing Quadriceps Stretch - 10 reps - 3 sets - 1x daily - 7x weekly  Diaphragmatic Breathing at 90/90 Supported - 1 sets - 3 minute hold - 1x daily - 7x weekly  Single Knee to Chest Stretch - 2 sets - 20 second hold - 1x daily - 7x weekly  Supine Double Knee to Chest - 2 sets - 20 second hold - 1x daily - 7x weekly  Child's Pose with Sidebending - 3 sets - 20 second hold - 1x daily - 7x weekly  Prone Hip Extension with Bent Knee - Two Pillows - 10 reps - 2 sets - 1x daily - 7x weekly

## 2017-11-28 NOTE — Therapy (Signed)
[facility] [address] [place], [place], [address] Phone: [phone]   Fax:  [phone]  Physical Therapy Treatment  Patient Details  Name: Scott Lane MRN: [id number] Date of Birth: October 23, 1985 Referring Provider: Delice Lesch, MD   Encounter Date: 11/28/2017  PT End of Session - 11/28/17 0912    Visit Number  33    Number of Visits  41    Date for PT Re-Evaluation  12/26/17    Authorization Type   Family Deductible.  VL: [place]    PT Start Time  0805    PT Stop Time  0849    PT Time Calculation (min)  44 min    Equipment Utilized During Treatment  Other (comment)   Bioness   Activity Tolerance  Patient tolerated treatment well    Behavior During Therapy  WFL for tasks assessed/performed       Past Medical History:  Diagnosis Date  . Chicken pox     Past Surgical History:  Procedure Laterality Date  . LUMBAR LAMINECTOMY/DECOMPRESSION MICRODISCECTOMY N/A 08/01/2017   Procedure: Thoracic nine, ten, eleven, twelve laminectomy;  Surgeon: Eustace Moore, MD;  Location: [facility];  Service: Neurosurgery;  Laterality: N/A;  . none    . WISDOM TOOTH EXTRACTION     bilateral lower.    There were no vitals filed for this visit.  Subjective Assessment - 11/28/17 0808    Subjective  No further numbness yesterday; back feels stiff this morning    Pertinent History  None - very active    Limitations  Standing;Walking    How long can you stand comfortably?  8-10 minutes    How long can you walk comfortably?  uses cane and RW household distance, RW for community distances    Diagnostic tests  MRI     Patient Stated Goals  To be more independent at home and improve LE strength    Currently in Pain?  Yes    Pain Score  2     Pain Location  Back    Pain Orientation  Lower    Pain Type  Chronic pain                       [facility] Adult PT Treatment/Exercise - 11/28/17 0809      Ambulation/Gait    Ambulation/Gait  Yes    Ambulation/Gait Assistance  4: Min assist    Ambulation/Gait Assistance Details  gait with Bioness in gait mode without use of AD with therapist providing cues for full L lateral weight shift, increased L stance time and increased step length RLE.  Also required assistance to maintain balance when turning/changing direction.  Also cued pt to increase gait speed    Ambulation Distance (Feet)  230 Feet    Assistive device  None    Gait Pattern  Step-through pattern;Decreased arm swing - right;Decreased arm swing - left;Decreased step length - right;Decreased stance time - left;Decreased stride length;Decreased weight shift to left;Wide base of support    Ambulation Surface  Level;Indoor      Exercises   Exercises  Other Exercises    Other Exercises   Reviewed single knee and double knee to chest and quadruped child's pose straight back, to left and to right for mid and low back stretch.  Also discussed decompression positioning in supine with hips and knees at 90/90.  Also performed 3 reps mat cat for core activation and mid back  flexion.  Returned to prone one one pillow and re-educated pt on how to isolate gluteal activation with increased pelvic stability and decreased use of lumbar extensors.  After multiple repetitions pt able to perform hip extension without therapist assist for lift      Modalities   Modalities  Electrical Stimulation      Electrical Stimulation   Electrical Stimulation Location  L anterior tibialis and quad    Electrical Stimulation Action  ankle DF and knee extension    Electrical Stimulation Parameters  see saved parameters in Tablet 1; steering electrode    Electrical Stimulation Goals  Strength;Neuromuscular facilitation             PT Education - 11/28/17 0911    Education provided  Yes    Education Details  back stretches, proper technique for prone hip extension, plan for therapy visits    Person(s) Educated  Patient    Methods   Explanation    Comprehension  Verbalized understanding      Access Code: [email or web address]  URL: [email or web address]  Date: 11/28/2017  Prepared by: Misty Stanley   Exercises  Sit to Stand without Arm Support - 10 reps - 2 sets - 1x daily - 7x weekly  Single Leg Sit to Stand with Arms Extended - 10 reps - 2 sets - 1x daily - 7x weekly  Supine Bridge - 10 reps - 1 sets - 1x daily - 7x weekly  Marching Bridge - 5 reps - 2 sets - 1x daily - 7x weekly  Sidelying Reverse Clamshell - 10 reps - 1 sets - 1x daily - 7x weekly  Quadruped Alternating Leg Extensions - 10 reps - 3 sets - 1x daily - 7x weekly  Seated Hamstring Stretch with Chair - 3 sets - 30 second hold - 1x daily - 7x weekly  Gastroc Stretch on Wall - 3 sets - 30 second hold - 1x daily - 7x weekly  Standing Quadriceps Stretch - 10 reps - 3 sets - 1x daily - 7x weekly  Diaphragmatic Breathing at 90/90 Supported - 1 sets - 3 minute hold - 1x daily - 7x weekly  Single Knee to Chest Stretch - 2 sets - 20 second hold - 1x daily - 7x weekly  Supine Double Knee to Chest - 2 sets - 20 second hold - 1x daily - 7x weekly  Child's Pose with Sidebending - 3 sets - 20 second hold - 1x daily - 7x weekly  Prone Hip Extension with Bent Knee - Two Pillows - 10 reps - 2 sets - 1x daily - 7x weekly   PT Short Term Goals - 11/21/17 4315      PT SHORT TERM GOAL #1   Title  Pt will improve standing balance as indicated by increased BERG balance score to >/= 44/56    Baseline  44/56    Time  5    Period  Weeks    Status  Achieved      PT SHORT TERM GOAL #2   Title  Pt will participate in initial fitting and training of AFO for indoor/outdoor surfaces with cane    Baseline  performed assessment with bracing but pt is not open to using right now    Time  5    Period  Weeks    Status  Achieved      PT SHORT TERM GOAL #3   Title  Pt will improve five time sit <> stand from arm chair to </=  15 seconds with supervision without use of UE to  demonstrate increased functional LE strength    Baseline  --    Time  5    Period  Weeks    Status  Achieved      PT SHORT TERM GOAL #4   Title  Pt will improve gait velocity with RW to >/= 1.8 ft/sec with cane and AFO    Baseline  1.3 with cane, no AFO    Time  5    Period  Weeks    Status  Not Met      PT SHORT TERM GOAL #5   Title  Pt will demonstrate ability to ambulate safely carry baby carrier over indoor surfaces with cane and supervision and up/down stairs with one rail    Baseline  min A up/down stairs with one rail without carrying carrier    Time  5    Period  Weeks    Status  Not Met        PT Long Term Goals - 10/09/17 1727      PT LONG TERM GOAL #1   Title  Pt will be independent with balance and LE strengthening HEP    Time  10    Period  Weeks    Status  Revised    Target Date  12/26/17      PT LONG TERM GOAL #2   Title  Pt will improve standing balance and BERG >/= 50/56 to decrease falls risk    Time  10    Period  Weeks    Status  Revised    Target Date  12/26/17      PT LONG TERM GOAL #3   Title  Pt will improve gait velocity to >/= 2.5 ft/sec with appropriate AFO/Bioness and LRAD or no AD    Time  10    Period  Weeks    Status  Revised    Target Date  12/26/17      PT LONG TERM GOAL #4   Title  Pt will ambulate x 1000 over outdoor surfaces (paved and grassy) MOD I with most appropriate AFO and LRAD or no AD; will negotiate 12 stairs, one rails, alternating sequence MOD I    Time  10    Period  Weeks    Status  Revised    Target Date  12/26/17      PT LONG TERM GOAL #5   Title  Pt will demonstrate ability to transition floor <> stand MOD I and perform infant care activities (carry, load carrier, push stroller, in the floor to play or change diaper, etc) with AFO and LRAD/or no AD    Time  10    Period  Weeks    Status  New    Target Date  12/26/17      PT LONG TERM GOAL #6   Title  Pt will report being able to stand on sidelines at  football practice and game with AFO and LRAD/or no AD with intermittent seated rest breaks and move away from players quickly without LOB    Time  10    Period  Weeks    Status  New    Target Date  12/26/17            Plan - 11/28/17 0915    Clinical Impression Statement  Treatment session continued to focus on daily stretching program to decrease tension and improve ROM for gait.  Also focused  on re-education of gluteal activation for hip extension and decreasing use of lumbar extensors with significant improvement in motor sequencing.  Pt continues to ask questions about prognosis of recovery, slow progress and possible cervical surgery in the future.  Pt has appointment for botox injection on Monday and f/u with neurosurgeon in early September.  Deferred questions about MRI images to physician but discussed length of time needed for neurological recovery and possibility of some permanent neurological impairments depending on level and extent of injury.  PT will likely continue through the end of this certification period and then D/C until after pt has had cervical spine surgery.  Pt agreeable with plan.    PT Treatment/Interventions  ADLs/Self Care Home Management;Cryotherapy;Electrical Stimulation;Moist Heat;DME Instruction;Gait Scientist, forensic;Therapeutic activities;Functional mobility training;Therapeutic exercise;Balance training;Neuromuscular re-education;Patient/family education;Orthotic Fit/Training;Passive range of motion;Taping;Aquatic Therapy    PT Next Visit Plan  Finish HEP; continue to focus on weight shift and WB through LLE for gait/stairs, narrow BOS.  Bioness LLE with treadmill; gait without AD    PT Home Exercise Plan  [email or web address]      Consulted and Agree with Plan of Care  Patient       Patient will benefit from skilled therapeutic intervention in order to improve the following deficits and impairments:  Abnormal gait, Decreased balance, Decreased coordination,  Decreased strength, Difficulty walking, Increased muscle spasms, Impaired sensation, Pain, Impaired tone, Decreased range of motion  Visit Diagnosis: Other abnormalities of gait and mobility  Unsteadiness  Other symptoms and signs involving the nervous system  Muscle weakness (generalized)  Other disturbances of skin sensation  Paraplegia, incomplete ([facility])     Problem List Patient Active Problem List   Diagnosis Date Noted  . Paraplegia ([facility]) 11/07/2017  . Quadriplegia, unspecified ([facility]) 11/07/2017  . Leukocytosis   . Neuropathic pain   . Drug induced constipation   . Urinary retention   . Class 1 obesity due to excess calories with serious comorbidity and body mass index (BMI) of 33.0 to 33.9 in adult   . Myelopathy ([facility]) 08/01/2017  . Thoracic spinal stenosis 07/31/2017    Rico Junker, PT, DPT 11/28/17    9:32 AM    [facility] [address] [place], [place], [address] Phone: [phone]   Fax:  [phone]  Name: CRESTON KLAS MRN: [id number] Date of Birth: [birth year]

## 2017-11-29 ENCOUNTER — Encounter: Payer: Self-pay | Admitting: Physical Therapy

## 2017-11-29 ENCOUNTER — Ambulatory Visit: Payer: 59 | Admitting: Physical Therapy

## 2017-11-29 DIAGNOSIS — R2681 Unsteadiness on feet: Secondary | ICD-10-CM

## 2017-11-29 DIAGNOSIS — R2689 Other abnormalities of gait and mobility: Secondary | ICD-10-CM

## 2017-11-29 DIAGNOSIS — R29818 Other symptoms and signs involving the nervous system: Secondary | ICD-10-CM | POA: Diagnosis not present

## 2017-11-29 DIAGNOSIS — R208 Other disturbances of skin sensation: Secondary | ICD-10-CM

## 2017-11-29 DIAGNOSIS — M6281 Muscle weakness (generalized): Secondary | ICD-10-CM | POA: Diagnosis not present

## 2017-11-29 DIAGNOSIS — G8222 Paraplegia, incomplete: Secondary | ICD-10-CM

## 2017-11-29 NOTE — Patient Instructions (Signed)
Access Code: [email or web address]  URL: [email or web address]  Date: 11/29/2017  Prepared by: Bufford LopeAudra Telitha Plath   Exercises  Single Leg Sit to Stand with Arms Extended - 10 reps - 2 sets - 1x daily - 7x weekly  Wall Quarter Squat - 5 reps - 10 second hold - 1x daily - 7x weekly  Supine Bridge - 10 reps - 1 sets - 1x daily - 7x weekly  Marching Bridge - 5 reps - 2 sets - 1x daily - 7x weekly  Sidelying Reverse Clamshell - 10 reps - 1 sets - 1x daily - 7x weekly  Quadruped Alternating Leg Extensions - 10 reps - 3 sets - 1x daily - 7x weekly  Prone Hip Extension with Bent Knee - Two Pillows - 10 reps - 2 sets - 1x daily - 7x weekly  Diaphragmatic Breathing at 90/90 Supported - 1 sets - 3 minute hold - 1x daily - 7x weekly  Single Knee to Chest Stretch - 2 sets - 20 second hold - 1x daily - 7x weekly  Supine Double Knee to Chest - 2 sets - 20 second hold - 1x daily - 7x weekly  Child's Pose with Sidebending - 3 sets - 20 second hold - 1x daily - 7x weekly  Seated Hamstring Stretch with Chair - 3 sets - 30 second hold - 1x daily - 7x weekly  Gastroc Stretch on Wall - 3 sets - 30 second hold - 1x daily - 7x weekly  Standing Hip Flexor Stretch with Foot Elevated - 3 sets - 30 second hold - 1x daily - 7x weekly

## 2017-11-29 NOTE — Therapy (Signed)
[facility] [address] [place], [place], [address] Phone: [phone]   Fax:  [phone]  Physical Therapy Treatment  Patient Details  Name: Scott Lane MRN: [id number] Date of Birth: Mar 15, 1986 Referring Provider: Delice Lesch, MD   Encounter Date: 11/29/2017  PT End of Session - 11/29/17 0908    Visit Number  34    Number of Visits  74    Date for PT Re-Evaluation  12/26/17    Authorization Type  [place] Family Deductible.  VL: [place]    PT Start Time  0804    PT Stop Time  0850    PT Time Calculation (min)  46 min    Equipment Utilized During Treatment  Other (comment)   Bioness   Activity Tolerance  Patient tolerated treatment well    Behavior During Therapy  WFL for tasks assessed/performed       Past Medical History:  Diagnosis Date  . Chicken pox     Past Surgical History:  Procedure Laterality Date  . LUMBAR LAMINECTOMY/DECOMPRESSION MICRODISCECTOMY N/A 08/01/2017   Procedure: Thoracic nine, ten, eleven, twelve laminectomy;  Surgeon: Eustace Moore, MD;  Location: [facility];  Service: Neurosurgery;  Laterality: N/A;  . none    . WISDOM TOOTH EXTRACTION     bilateral lower.    There were no vitals filed for this visit.  Subjective Assessment - 11/29/17 0814    Subjective  Stretches helped his back yesterday; people at work noticed he was walking better.  Had a busy day yesterday so he is stiff again this morning.  Discussed importance of performing stretching program each morning and evening    Pertinent History  None - very active    Limitations  Standing;Walking    How long can you stand comfortably?  8-10 minutes    How long can you walk comfortably?  uses cane and RW household distance, RW for community distances    Diagnostic tests  MRI     Patient Stated Goals  To be more independent at home and improve LE strength                       [facility] Adult PT Treatment/Exercise -  11/29/17 0858      Lumbar Exercises: Stretches   Quad Stretch  Right;Left;3 reps;30 seconds   child's pose straight back, to L and R     Lumbar Exercises: Seated   Sit to Stand  5 reps   bilat LE, no UE.  LLE only with bilat UE support     Knee/Hip Exercises: Stretches   Gastroc Stretch  Right;Left;2 reps;30 seconds    Gastroc Stretch Limitations  standing gastroc stretch    Other Knee/Hip Stretches  Single knee to chest x 2 reps each side x 30 seconds, double knee to chest x 2 reps x 30 seconds.  Seated hamstring stretch x 2 reps each LE x 30 seconds with strap.        Knee/Hip Exercises: Standing   Wall Squat  Other (comment);10 seconds   3 reps quarter wall squat   Wall Squat Limitations  with bilat UE support.      Modalities   Modalities  Teacher, English as a foreign language Location  L anterior tibialis and quad    Electrical Stimulation Action  ankle DF and knee extension during sit > stand and wall squat exercises  Electrical Stimulation Parameters  see saved parameters in Tablet 1; steering electrode    Electrical Stimulation Goals  Strength;Neuromuscular facilitation             PT Education - 11/29/17 0908    Education provided  Yes    Education Details  importance of performing daily stretching program due to tone; purpose of Botox for spasticity    Person(s) Educated  Patient    Methods  Explanation    Comprehension  Verbalized understanding      Access Code: [email or web address]  URL: [email or web address]  Date: 11/29/2017  Prepared by: Misty Stanley   Exercises  Single Leg Sit to Stand with Arms Extended - 10 reps - 2 sets - 1x daily - 7x weekly  Wall Quarter Squat - 5 reps - 10 second hold - 1x daily - 7x weekly  Supine Bridge - 10 reps - 1 sets - 1x daily - 7x weekly  Marching Bridge - 5 reps - 2 sets - 1x daily - 7x weekly  Sidelying Reverse Clamshell - 10 reps - 1 sets - 1x daily - 7x weekly   Quadruped Alternating Leg Extensions - 10 reps - 3 sets - 1x daily - 7x weekly  Prone Hip Extension with Bent Knee - Two Pillows - 10 reps - 2 sets - 1x daily - 7x weekly  Diaphragmatic Breathing at 90/90 Supported - 1 sets - 3 minute hold - 1x daily - 7x weekly  Single Knee to Chest Stretch - 2 sets - 20 second hold - 1x daily - 7x weekly  Supine Double Knee to Chest - 2 sets - 20 second hold - 1x daily - 7x weekly  Child's Pose with Sidebending - 3 sets - 20 second hold - 1x daily - 7x weekly  Seated Hamstring Stretch with Chair - 3 sets - 30 second hold - 1x daily - 7x weekly  Gastroc Stretch on Wall - 3 sets - 30 second hold - 1x daily - 7x weekly  Standing Hip Flexor Stretch with Foot Elevated - 3 sets - 30 second hold - 1x daily - 7x weekly    PT Short Term Goals - 11/21/17 2841      PT SHORT TERM GOAL #1   Title  Pt will improve standing balance as indicated by increased BERG balance score to >/= 44/56    Baseline  44/56    Time  5    Period  Weeks    Status  Achieved      PT SHORT TERM GOAL #2   Title  Pt will participate in initial fitting and training of AFO for indoor/outdoor surfaces with cane    Baseline  performed assessment with bracing but pt is not open to using right now    Time  5    Period  Weeks    Status  Achieved      PT SHORT TERM GOAL #3   Title  Pt will improve five time sit <> stand from arm chair to </= 15 seconds with supervision without use of UE to demonstrate increased functional LE strength    Baseline  --    Time  5    Period  Weeks    Status  Achieved      PT SHORT TERM GOAL #4   Title  Pt will improve gait velocity with RW to >/= 1.8 ft/sec with cane and AFO    Baseline  1.3 with cane, no AFO    Time  5    Period  Weeks    Status  Not Met      PT SHORT TERM GOAL #5   Title  Pt will demonstrate ability to ambulate safely carry baby carrier over indoor surfaces with cane and supervision and up/down stairs with one rail    Baseline  min A  up/down stairs with one rail without carrying carrier    Time  5    Period  Weeks    Status  Not Met        PT Long Term Goals - 10/09/17 1727      PT LONG TERM GOAL #1   Title  Pt will be independent with balance and LE strengthening HEP    Time  10    Period  Weeks    Status  Revised    Target Date  12/26/17      PT LONG TERM GOAL #2   Title  Pt will improve standing balance and BERG >/= 50/56 to decrease falls risk    Time  10    Period  Weeks    Status  Revised    Target Date  12/26/17      PT LONG TERM GOAL #3   Title  Pt will improve gait velocity to >/= 2.5 ft/sec with appropriate AFO/Bioness and LRAD or no AD    Time  10    Period  Weeks    Status  Revised    Target Date  12/26/17      PT LONG TERM GOAL #4   Title  Pt will ambulate x 1000 over outdoor surfaces (paved and grassy) MOD I with most appropriate AFO and LRAD or no AD; will negotiate 12 stairs, one rails, alternating sequence MOD I    Time  10    Period  Weeks    Status  Revised    Target Date  12/26/17      PT LONG TERM GOAL #5   Title  Pt will demonstrate ability to transition floor <> stand MOD I and perform infant care activities (carry, load carrier, push stroller, in the floor to play or change diaper, etc) with AFO and LRAD/or no AD    Time  10    Period  Weeks    Status  New    Target Date  12/26/17      PT LONG TERM GOAL #6   Title  Pt will report being able to stand on sidelines at football practice and game with AFO and LRAD/or no AD with intermittent seated rest breaks and move away from players quickly without LOB    Time  10    Period  Weeks    Status  New    Target Date  12/26/17            Plan - 11/29/17 0909    Clinical Impression Statement  Continued to review back and LE stretches for tone management and ROM and importance of performing each day, 2x/day.  Continued to review current HEP with use of BIONESS in training mode; removed sit > stand bilat LE without UE  support but added partial wall squat back in to HEP.  Will complete HEP with quadruped exercises next clinic session.  Pt to resume aquatic next week.  Will also continue to assess following Botox injection on Monday.    Rehab Potential  Good    Clinical Impairments Affecting Rehab Potential  will have to have cervical laminectomy soon  PT Treatment/Interventions  ADLs/Self Care Home Management;Cryotherapy;Electrical Stimulation;Moist Heat;DME Instruction;Gait Scientist, forensic;Therapeutic activities;Functional mobility training;Therapeutic exercise;Balance training;Neuromuscular re-education;Patient/family education;Orthotic Fit/Training;Passive range of motion;Taping;Aquatic Therapy    PT Next Visit Plan  Finish HEP; continue to focus on weight shift and WB through LLE for gait/stairs, narrow BOS.  Bioness LLE with treadmill; gait without AD    PT Home Exercise Plan  [email or web address]      Consulted and Agree with Plan of Care  Patient       Patient will benefit from skilled therapeutic intervention in order to improve the following deficits and impairments:  Abnormal gait, Decreased balance, Decreased coordination, Decreased strength, Difficulty walking, Increased muscle spasms, Impaired sensation, Pain, Impaired tone, Decreased range of motion  Visit Diagnosis: Other abnormalities of gait and mobility  Unsteadiness  Other symptoms and signs involving the nervous system  Muscle weakness (generalized)  Other disturbances of skin sensation  Paraplegia, incomplete ([place] [facility])     Problem List Patient Active Problem List   Diagnosis Date Noted  . Paraplegia ([facility]) 11/07/2017  . Quadriplegia, unspecified ([facility]) 11/07/2017  . Leukocytosis   . Neuropathic pain   . Drug induced constipation   . Urinary retention   . Class 1 obesity due to excess calories with serious comorbidity and body mass index (BMI) of 33.0 to 33.9 in adult   . Myelopathy ([facility]) 08/01/2017  . Thoracic spinal stenosis  07/31/2017    Rico Junker, PT, DPT 11/29/17    9:13 AM    [facility] [address] [place], [place], [address] Phone: [phone]   Fax:  [phone]  Name: Scott Lane MRN: [id number] Date of Birth: Mar 20, 1986

## 2017-12-02 ENCOUNTER — Ambulatory Visit (INDEPENDENT_AMBULATORY_CARE_PROVIDER_SITE_OTHER): Payer: 59 | Admitting: Neurology

## 2017-12-02 ENCOUNTER — Encounter: Payer: Self-pay | Admitting: Neurology

## 2017-12-02 VITALS — BP 129/83 | HR 80 | Ht 72.0 in | Wt 250.0 lb

## 2017-12-02 DIAGNOSIS — G8250 Quadriplegia, unspecified: Secondary | ICD-10-CM

## 2017-12-02 MED ORDER — INCOBOTULINUMTOXINA 100 UNITS IM SOLR
300.0000 [IU] | INTRAMUSCULAR | Status: DC
  Administered 2017-12-02: 300 [IU] via INTRAMUSCULAR

## 2017-12-02 NOTE — Progress Notes (Signed)
**  [employer] 100 units x 3 vials, NDC 0259-1610-01, Lot [id number], Exp 01/2020, office supply.//mck,rn** 

## 2017-12-02 NOTE — Progress Notes (Signed)
PATIENT: Scott Lane DOB: Jan 24, 1986  Chief Complaint  Patient presents with  . Spastic Hemiplegia    1st injection - Xeomin 100 units x 3 vials - office supply     HISTORICAL  Scott Lane is 32 years old male, seen in request by his primary care physician Dr.Stephen Durene Cal for evaluation of Botox injection for spastic left lower extremity, He is accompanied by his wife, initial evaluation was on November 07, 2017.  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  MRI of cervical spine, congenital canal narrowing, no significant canal or foraminal narrowing,  He underwent decompressive thoracic laminectomy, medial facetectomy T9-10 and T10-11 T11-12 by Marikay Alar, MD  Post surgically,he had significant recovery with ongoing physical therapy, but he still has significant bilateral lower extremity  spasticity, left worse than right, difficulty clearing left leg from floor, mild urinary spasticity, hesitant to start  UPDATE December 02 2017: He has significant spasticity of left lower extremity, tried baclofen with tizanidine, cause drowsiness without provide significant help.   He has appointment with neurosurgeon in September 2019, consider upper thoracic decompression surgery,  Today is his first electrical stimulation guided xeomin injection for spastic left lower extremity,  REVIEW OF SYSTEMS: Full 14 system review of systems performed and notable only for as above  ALLERGIES: No Known Allergies  HOME MEDICATIONS: Current Outpatient Medications  Medication Sig Dispense Refill  . baclofen (LIORESAL) 20 MG tablet Take 20 mg by mouth 3 (three) times daily.     Marland Kitchen tiZANidine (ZANAFLEX) 4 MG tablet Take 1 tablet (4 mg total) by mouth 3 (three) times daily. 90 tablet 6   No current facility-administered medications for this visit.     PAST MEDICAL HISTORY: Past Medical History:  Diagnosis Date  . Chicken pox     PAST SURGICAL HISTORY: Past Surgical History:  Procedure Laterality Date  . LUMBAR LAMINECTOMY/DECOMPRESSION MICRODISCECTOMY N/A 08/01/2017   Procedure: Thoracic nine, ten, eleven, twelve laminectomy;  Surgeon: Tia Alert, MD;  Location: [facility];  Service: Neurosurgery;  Laterality: N/A;  . none    . WISDOM TOOTH EXTRACTION     bilateral lower.    FAMILY HISTORY: Family History  Problem Relation Age of Onset  . Hypertension Mother   . Diabetes Mother   . Hypertension Father   . Lung cancer Father        smoker  . Hypertension Brother   . Hypertension Maternal Grandmother   . Lung cancer Maternal  Grandmother        non smoker  . Hypertension Paternal Grandmother     SOCIAL HISTORY: Social History   Socioeconomic History  . Marital status: Married    Spouse name: Not on file  . Number of children: Not on file  . Years of education: Not on file  .  Highest education level: Not on file  Occupational History  . Not on file  Social Needs  . Financial resource strain: Not on file  . Food insecurity:    Worry: Not on file    Inability: Not on file  . Transportation needs:    Medical: Not on file    Non-medical: Not on file  Tobacco Use  . Smoking status: Never Smoker  . Smokeless tobacco: Never Used  Substance and Sexual Activity  . Alcohol use: Yes    Comment: occasional on weekend  . Drug use: No  . Sexual activity: Yes  Lifestyle  . Physical activity:    Days per week: Not on file    Minutes per session: Not on file  . Stress: Not on file  Relationships  . Social connections:    Talks on phone: Not on file    Gets together: Not on file    Attends religious service: Not on file    Active member of club or organization: Not on file    Attends meetings of clubs or organizations: Not on file    Relationship status: Not on file  . Intimate partner violence:    Fear of current or ex partner: Not on file    Emotionally abused: Not on file    Physically abused: Not on file    Forced sexual activity: Not on file  Other Topics Concern  . Not on file  Social History Narrative   Married 2018. Wife pregnant with first child- daughter.    Wife works IT with [employer].       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      PHYSICAL EXAM   Vitals:   12/02/17 1056  BP: 129/83  Pulse: 80  Weight: 250 lb (113.4 kg)  Height: 6' (1.829 m)    Not recorded      Body mass index is 33.91 kg/m.  PHYSICAL EXAMNIATION:  Gen: NAD, conversant, well nourised, obese, well groomed                     Cardiovascular: Regular rate rhythm, no peripheral edema, warm, nontender. Eyes: Conjunctivae clear without exudates or hemorrhage Neck: Supple, no carotid bruits. Pulmonary: Clear to  auscultation bilaterally   NEUROLOGICAL EXAM:  MENTAL STATUS: Speech:    Speech is normal; fluent and spontaneous with normal comprehension.  Cognition:     Orientation to time, place and person     Normal recent and remote memory     Normal Attention span and concentration     Normal Language, naming, repeating,spontaneous speech     Fund of knowledge   CRANIAL NERVES: CN II: Visual fields are full to confrontation. Fundoscopic exam is normal with sharp discs and no vascular changes. Pupils are round equal and briskly reactive to light. CN III, IV, VI: extraocular movement are normal. No ptosis. CN V: Facial sensation is intact to pinprick in all 3  divisions bilaterally. Corneal responses are intact.  CN VII: Face is symmetric with normal eye closure and smile. CN VIII: Hearing is normal to rubbing fingers CN IX, X: Palate elevates symmetrically. Phonation is normal. CN XI: Head turning and shoulder shrug are intact CN XII: Tongue is midline with normal movements and no atrophy.  MOTOR: Bilateral upper extremities normal, bilateral lower extremity spasticity, mild on the right side, mild to moderate on the left side, there was no significant muscle weakness,  REFLEXES: Reflexes are 2+ and symmetric at the biceps, triceps, 3 knees, and ankles, with sustained ankle clonus on the left side. Plantar responses are extensor bilaterally  SENSORY: Intact to light touch, pinprick, positional sensation and vibratory sensation are intact in fingers and toes.  COORDINATION: Rapid alternating movements and fine finger movements are intact. There is no dysmetria on finger-to-nose and heel-knee-shin.    GAIT/STANCE: He needs push up to get up from seated position, cautious, stiff, dragging left leg.  DIAGNOSTIC DATA (LABS, IMAGING, TESTING) - I reviewed patient records, labs, notes, testing and imaging myself where available.   ASSESSMENT AND PLAN  Orene DesanctisLee M Nott is a 32 y.o. male     Thoracic myelopathy with residue spastic paraplegia   Left worse than right, first electrical stimulation guided xeomin injection for spastic left lower extremity on December 02, 2017,   We used xeomin 300 units   Left adductor longus 50 units  Left vastus medialis 50 units  Left vastus intermedialis 50 units  Left rectus femoris 50 units  Left tibialis posterior 50 units  Left flexor digitorum longus 25 units  Left medial gastrocnemius 25 units   Levert FeinsteinYijun Jacqualin Shirkey, M.D. Ph.D.  [facility] [address], [address] [place], [place] [address] Ph: [phone] Fax: [phone]  CC: Referring Provider

## 2017-12-04 ENCOUNTER — Encounter: Payer: Self-pay | Admitting: Physical Therapy

## 2017-12-04 ENCOUNTER — Ambulatory Visit: Payer: 59 | Admitting: Physical Therapy

## 2017-12-04 DIAGNOSIS — G8222 Paraplegia, incomplete: Secondary | ICD-10-CM | POA: Diagnosis not present

## 2017-12-04 DIAGNOSIS — R29818 Other symptoms and signs involving the nervous system: Secondary | ICD-10-CM

## 2017-12-04 DIAGNOSIS — R208 Other disturbances of skin sensation: Secondary | ICD-10-CM | POA: Diagnosis not present

## 2017-12-04 DIAGNOSIS — R2689 Other abnormalities of gait and mobility: Secondary | ICD-10-CM | POA: Diagnosis not present

## 2017-12-04 DIAGNOSIS — R2681 Unsteadiness on feet: Secondary | ICD-10-CM

## 2017-12-04 DIAGNOSIS — M6281 Muscle weakness (generalized): Secondary | ICD-10-CM | POA: Diagnosis not present

## 2017-12-04 NOTE — Therapy (Signed)
[facility] [address] [place], [place], [address] Phone: [phone]   Fax:  [phone]  Physical Therapy Treatment  Patient Details  Name: Scott Lane MRN: [id number] Date of Birth: 1985/06/10 Referring Provider: Delice Lesch, MD   Encounter Date: 12/04/2017  PT End of Session - 12/04/17 2128    Visit Number  35    Number of Visits  47    Date for PT Re-Evaluation  12/26/17    Authorization Type  [place] Family Deductible.  VL: MN    PT Start Time  1432    PT Stop Time  1520    PT Time Calculation (min)  48 min    Equipment Utilized During Treatment  Other (comment)   pool noodles used for support for assistance with balance   Activity Tolerance  Patient tolerated treatment well    Behavior During Therapy  WFL for tasks assessed/performed       Past Medical History:  Diagnosis Date  . Chicken pox     Past Surgical History:  Procedure Laterality Date  . LUMBAR LAMINECTOMY/DECOMPRESSION MICRODISCECTOMY N/A 08/01/2017   Procedure: Thoracic nine, ten, eleven, twelve laminectomy;  Surgeon: Eustace Moore, MD;  Location: [facility];  Service: Neurosurgery;  Laterality: N/A;  . none    . WISDOM TOOTH EXTRACTION     bilateral lower.    There were no vitals filed for this visit.  Subjective Assessment - 12/04/17 2123    Subjective  Pt states he received Botox injections on Monday - states he feels left leg is already a little less stiff, but says doctor told him it would take about a week to see effects of the Botox     Patient Stated Goals  To be more independent at home and improve LE strength    Currently in Pain?  Other (Comment)   tightness, no true pain reported                  Adult Aquatic Therapy - 12/04/17 2126      Aquatic Therapy Subjective   Subjective  Pt used SPC to amb. from pool side to steps for entrance - approx. 60' ; pt entered pool iwith use of 2 hand rails using a step  by step sequence for descension      Treatment   Gait  Pt amb. in 3'-5' depth of water without UE support on side of pool - forward, back and side directions 2 laps each direction: using buoyancy of water for support for balance    Exercises  Pt performed Lt hamstring stretch (runner's stretch ) x 60 sec hold x 1 reps for LLE stretching; Lt heel cord stretch 60 sec hold 1 rep ;  Pt performed standing Lt hip flexion, extension and abduction with Lt foot dorsiflexed  10 reps each using viscosity of water for resistance.             Pt performed jumping x 10 reps with bil. UE support ; joggin g in place 10 reps each leg for high level balance training Pt performed crossovers front 25' without UE support x 2 reps; stepping behind with RLE behind LLE and then LLE behind RLE x 2 reps  In 5' depth of water; buoyancy of water used for support to prevent LOB/falling  Pt pushed pool noodle down into water with each LE using current and viscosity of water for resistance Knee flexion - LLE - 10 reps slowly  and then 10 reps quickly for increased resistance with viscosity  Lunges x 5 reps with bil. UE support using buoyancy of water for off loading body Pt jogged approx. 30' across pool x 2 reps using noodle for support for assistance with balance - buoyancy of water used to assist in off loading body    Bil. LE stretches performed again at end of session - heel cord stretch, runner's stretch, hip adductor stretch and hamstring stretch with foot on pool wall with  Assistance to maintain position - stretches held approx. 45-60 secs each - RLE and LLE  PT Short Term Goals - 11/21/17 0824      PT SHORT TERM GOAL #1   Title  Pt will improve standing balance as indicated by increased BERG balance score to >/= 44/56    Baseline  44/56    Time  5    Period  Weeks    Status  Achieved      PT SHORT TERM GOAL #2   Title  Pt will participate in initial fitting and training of AFO for indoor/outdoor surfaces  with cane    Baseline  performed assessment with bracing but pt is not open to using right now    Time  5    Period  Weeks    Status  Achieved      PT SHORT TERM GOAL #3   Title  Pt will improve five time sit <> stand from arm chair to </= 15 seconds with supervision without use of UE to demonstrate increased functional LE strength    Baseline  --    Time  5    Period  Weeks    Status  Achieved      PT SHORT TERM GOAL #4   Title  Pt will improve gait velocity with RW to >/= 1.8 ft/sec with cane and AFO    Baseline  1.3 with cane, no AFO    Time  5    Period  Weeks    Status  Not Met      PT SHORT TERM GOAL #5   Title  Pt will demonstrate ability to ambulate safely carry baby carrier over indoor surfaces with cane and supervision and up/down stairs with one rail    Baseline  min A up/down stairs with one rail without carrying carrier    Time  5    Period  Weeks    Status  Not Met        PT Long Term Goals - 10/09/17 1727      PT LONG TERM GOAL #1   Title  Pt will be independent with balance and LE strengthening HEP    Time  10    Period  Weeks    Status  Revised    Target Date  12/26/17      PT LONG TERM GOAL #2   Title  Pt will improve standing balance and BERG >/= 50/56 to decrease falls risk    Time  10    Period  Weeks    Status  Revised    Target Date  12/26/17      PT LONG TERM GOAL #3   Title  Pt will improve gait velocity to >/= 2.5 ft/sec with appropriate AFO/Bioness and LRAD or no AD    Time  10    Period  Weeks    Status  Revised    Target Date  12/26/17      PT LONG  TERM GOAL #4   Title  Pt will ambulate x 1000 over outdoor surfaces (paved and grassy) MOD I with most appropriate AFO and LRAD or no AD; will negotiate 12 stairs, one rails, alternating sequence MOD I    Time  10    Period  Weeks    Status  Revised    Target Date  12/26/17      PT LONG TERM GOAL #5   Title  Pt will demonstrate ability to transition floor <> stand MOD I and perform  infant care activities (carry, load carrier, push stroller, in the floor to play or change diaper, etc) with AFO and LRAD/or no AD    Time  10    Period  Weeks    Status  New    Target Date  12/26/17      PT LONG TERM GOAL #6   Title  Pt will report being able to stand on sidelines at football practice and game with AFO and LRAD/or no AD with intermittent seated rest breaks and move away from players quickly without LOB    Time  10    Period  Weeks    Status  New    Target Date  12/26/17            Plan - 12/04/17 2130    Clinical Impression Statement  Aquatic therapy session focused on lower extremity strengthening, stretching, gait and balance with minimal UE support; pt reports he feels some reduction in tone from Botox injection received on Monday (12-02-17).  Pt able to amb. forwards and sideways 30' across pool with UE support on pool noodle.  LLE was tremoring near end of session after pt had performed jumping, lunges and jogging activities.  Pt's LLE noted to have less tightness and increased flexibility compared to status in previous aquatic pT session on 11-13-17.                                                                                                     Clinical Impairments Affecting Rehab Potential  will have to have cervical laminectomy soon    PT Frequency  3x / week    PT Duration  Other (comment)    PT Treatment/Interventions  ADLs/Self Care Home Management;Cryotherapy;Electrical Stimulation;Moist Heat;DME Instruction;Gait Scientist, forensic;Therapeutic activities;Functional mobility training;Therapeutic exercise;Balance training;Neuromuscular re-education;Patient/family education;Orthotic Fit/Training;Passive range of motion;Taping;Aquatic Therapy    PT Next Visit Plan  Finish HEP; continue to focus on weight shift and WB through LLE for gait/stairs, narrow BOS.  Bioness LLE with treadmill; gait without AD    PT Home Exercise Plan  [id number]  ; plan to add aquatic  exercise pictures to HEP as pt states he plans to go to [facility]        Patient will benefit from skilled therapeutic intervention in order to improve the following deficits and impairments:  Abnormal gait, Decreased balance, Decreased coordination, Decreased strength, Difficulty walking, Increased muscle spasms, Impaired sensation, Pain, Impaired tone, Decreased range of motion  Visit Diagnosis: Other abnormalities of gait and mobility  Unsteadiness  Other symptoms and signs  involving the nervous system     Problem List Patient Active Problem List   Diagnosis Date Noted  . Paraplegia ([place]) 11/07/2017  . Quadriplegia, unspecified ([place]) 11/07/2017  . Leukocytosis   . Neuropathic pain   . Drug induced constipation   . Urinary retention   . Class 1 obesity due to excess calories with serious comorbidity and body mass index (BMI) of 33.0 to 33.9 in adult   . Myelopathy ([place]) 08/01/2017  . Thoracic spinal stenosis 07/31/2017    Alda Lea, PT 12/04/2017, 9:39 PM  [facility] [address] [place], [place], [address] Phone: [phone]   Fax:  [phone]  Name: DONTAYE HUR MRN: [id number] Date of Birth: 29-May-1985

## 2017-12-05 ENCOUNTER — Encounter: Payer: Self-pay | Admitting: Physical Therapy

## 2017-12-05 ENCOUNTER — Ambulatory Visit: Payer: 59 | Admitting: Physical Therapy

## 2017-12-05 DIAGNOSIS — G8222 Paraplegia, incomplete: Secondary | ICD-10-CM

## 2017-12-05 DIAGNOSIS — M6281 Muscle weakness (generalized): Secondary | ICD-10-CM

## 2017-12-05 DIAGNOSIS — R29818 Other symptoms and signs involving the nervous system: Secondary | ICD-10-CM | POA: Diagnosis not present

## 2017-12-05 DIAGNOSIS — R2681 Unsteadiness on feet: Secondary | ICD-10-CM

## 2017-12-05 DIAGNOSIS — R2689 Other abnormalities of gait and mobility: Secondary | ICD-10-CM

## 2017-12-05 DIAGNOSIS — R208 Other disturbances of skin sensation: Secondary | ICD-10-CM

## 2017-12-05 NOTE — Patient Instructions (Signed)
Access Code: [email or web address]  URL: [email or web address]  Date: 12/05/2017  Prepared by: Bufford LopeAudra Malique Driskill   Exercises  Single Leg Sit to Stand with Arms Extended - 10 reps - 2 sets - 1x daily - 7x weekly  Wall Quarter Squat - 5 reps - 10 second hold - 1x daily - 7x weekly  Supine Bridge - 10 reps - 1 sets - 1x daily - 7x weekly  Marching Bridge - 5 reps - 2 sets - 1x daily - 7x weekly  Sidelying Reverse Clamshell - 10 reps - 1 sets - 1x daily - 7x weekly  Prone Hip Extension with Bent Knee - Two Pillows - 10 reps - 2 sets - 1x daily - 7x weekly  Diaphragmatic Breathing at 90/90 Supported - 1 sets - 3 minute hold - 1x daily - 7x weekly  Single Knee to Chest Stretch - 2 sets - 20 second hold - 1x daily - 7x weekly  Supine Double Knee to Chest - 2 sets - 20 second hold - 1x daily - 7x weekly  Child's Pose with Sidebending - 3 sets - 20 second hold - 1x daily - 7x weekly  Seated Hamstring Stretch with Chair - 3 sets - 30 second hold - 1x daily - 7x weekly  Standing Hip Flexor Stretch with Foot Elevated - 3 sets - 30 second hold - 1x daily - 7x weekly  Gastroc Stretch on Wall - 3 sets - 30 second hold - 1x daily - 7x weekly  Quadruped Alternating Leg Extensions - 10 reps - 2 sets - 1x daily - 7x weekly  Quadruped Fire Hydrant - 10 reps - 2 sets - 1x daily - 7x weekly

## 2017-12-05 NOTE — Therapy (Signed)
[facility] [address] [place], [place], [address] Phone: [phone]   Fax:  [phone]  Physical Therapy Treatment  Patient Details  Name: Scott Lane MRN: [id number] Date of Birth: 1985-04-24 Referring Provider: Delice Lesch, MD   Encounter Date: 12/05/2017  PT End of Session - 12/05/17 1255    Visit Number  36    Number of Visits  35    Date for PT Re-Evaluation  12/26/17    Authorization Type  [employer] Deductible.  VL: [place]    PT Start Time  0802    PT Stop Time  0850    PT Time Calculation (min)  48 min    Activity Tolerance  Patient tolerated treatment well    Behavior During Therapy  WFL for tasks assessed/performed       Past Medical History:  Diagnosis Date  . Chicken pox     Past Surgical History:  Procedure Laterality Date  . LUMBAR LAMINECTOMY/DECOMPRESSION MICRODISCECTOMY N/A 08/01/2017   Procedure: Thoracic nine, ten, eleven, twelve laminectomy;  Surgeon: Eustace Moore, MD;  Location: [facility];  Service: Neurosurgery;  Laterality: N/A;  . none    . WISDOM TOOTH EXTRACTION     bilateral lower.    There were no vitals filed for this visit.  Subjective Assessment - 12/05/17 1247    Subjective  Had injections Monday with aquatic yesterday - LLE feeling looser.  Is now hoping to have upper spinal surgery in September.    Pertinent History  None - very active    Limitations  Standing;Walking    How long can you stand comfortably?  8-10 minutes    How long can you walk comfortably?  uses cane and RW household distance, RW for community distances    Diagnostic tests  MRI     Patient Stated Goals  To be more independent at home and improve LE strength    Currently in Pain?  No/denies                       [facility] Adult PT Treatment/Exercise - 12/05/17 1249      Ambulation/Gait   Ambulation/Gait  Yes    Ambulation/Gait Assistance  4: Min assist    Ambulation/Gait  Assistance Details  without cane but with Bioness in gait mode with focus on L trunk elongation, R trunk shortening during L weight shifting    Ambulation Distance (Feet)  100 Feet    Assistive device  None    Gait Pattern  Decreased hip/knee flexion - left;Decreased hip/knee flexion - right;Decreased weight shift to left    Ambulation Surface  Level;Indoor      Lumbar Exercises: Quadruped   Straight Leg Raise  5 reps;2 seconds   assist to stabilize pelvis      Knee/Hip Exercises: Stretches   Sports administrator  Right;Left;30 seconds;3 reps    Sports administrator Limitations  child's pose forwards and then with R/L bias    Press photographer  Right;Left;2 reps;30 seconds    Gastroc Stretch Limitations  standing gastroc stretch    Other Knee/Hip Stretches  Seated R/L hamstring stretches with strap, 2 sets x 30 seconds      Knee/Hip Exercises: Aerobic   Tread Mill  with Bioness in gait mode x 5 minutes at 1.2 mph with bilat UE support progressing to one UE support and then 0.9 mph without UE support with therapist providing cues and facilitation for full L  lateral weight shift and increased L stance time.  Pt demonstrating improved step length with LLE and increased stride length with decreased resistance to hip flexion during swing.  Also demonstrating more fluid hip and knee flexion on LLE during swing phase      Modalities   Modalities  Electrical Stimulation      Electrical Stimulation   Electrical Stimulation Location  L anterior tibialis and L hamstring    Electrical Stimulation Action  closed and open chain ankle DF, hip extension and knee flexion    Electrical Stimulation Parameters  Tablet 2 saved parameters; steering electrode    Electrical Stimulation Goals  Strength;Neuromuscular facilitation             PT Education - 12/05/17 1255    Education provided  Yes    Education Details  completed HEP review; gait with Bioness    Person(s) Educated  Patient    Methods   Explanation;Demonstration;Handout    Comprehension  Verbalized understanding;Returned demonstration       Access Code: [email or web address]  URL: [email or web address]  Date: 12/05/2017  Prepared by: Misty Stanley   Exercises  Single Leg Sit to Stand with Arms Extended - 10 reps - 2 sets - 1x daily - 7x weekly  Wall Quarter Squat - 5 reps - 10 second hold - 1x daily - 7x weekly  Supine Bridge - 10 reps - 1 sets - 1x daily - 7x weekly  Marching Bridge - 5 reps - 2 sets - 1x daily - 7x weekly  Sidelying Reverse Clamshell - 10 reps - 1 sets - 1x daily - 7x weekly  Prone Hip Extension with Bent Knee - Two Pillows - 10 reps - 2 sets - 1x daily - 7x weekly  Diaphragmatic Breathing at 90/90 Supported - 1 sets - 3 minute hold - 1x daily - 7x weekly  Single Knee to Chest Stretch - 2 sets - 20 second hold - 1x daily - 7x weekly  Supine Double Knee to Chest - 2 sets - 20 second hold - 1x daily - 7x weekly  Child's Pose with Sidebending - 3 sets - 20 second hold - 1x daily - 7x weekly  Seated Hamstring Stretch with Chair - 3 sets - 30 second hold - 1x daily - 7x weekly  Standing Hip Flexor Stretch with Foot Elevated - 3 sets - 30 second hold - 1x daily - 7x weekly  Gastroc Stretch on Wall - 3 sets - 30 second hold - 1x daily - 7x weekly  Quadruped Alternating Leg Extensions - 10 reps - 2 sets - 1x daily - 7x weekly  Quadruped Fire Hydrant - 10 reps - 2 sets - 1x daily - 7x weekly    PT Short Term Goals - 11/21/17 6073      PT SHORT TERM GOAL #1   Title  Pt will improve standing balance as indicated by increased BERG balance score to >/= 44/56    Baseline  44/56    Time  5    Period  Weeks    Status  Achieved      PT SHORT TERM GOAL #2   Title  Pt will participate in initial fitting and training of AFO for indoor/outdoor surfaces with cane    Baseline  performed assessment with bracing but pt is not open to using right now    Time  5    Period  Weeks    Status  Achieved      PT  SHORT  TERM GOAL #3   Title  Pt will improve five time sit <> stand from arm chair to </= 15 seconds with supervision without use of UE to demonstrate increased functional LE strength    Baseline  --    Time  5    Period  Weeks    Status  Achieved      PT SHORT TERM GOAL #4   Title  Pt will improve gait velocity with RW to >/= 1.8 ft/sec with cane and AFO    Baseline  1.3 with cane, no AFO    Time  5    Period  Weeks    Status  Not Met      PT SHORT TERM GOAL #5   Title  Pt will demonstrate ability to ambulate safely carry baby carrier over indoor surfaces with cane and supervision and up/down stairs with one rail    Baseline  min A up/down stairs with one rail without carrying carrier    Time  5    Period  Weeks    Status  Not Met        PT Long Term Goals - 10/09/17 1727      PT LONG TERM GOAL #1   Title  Pt will be independent with balance and LE strengthening HEP    Time  10    Period  Weeks    Status  Revised    Target Date  12/26/17      PT LONG TERM GOAL #2   Title  Pt will improve standing balance and BERG >/= 50/56 to decrease falls risk    Time  10    Period  Weeks    Status  Revised    Target Date  12/26/17      PT LONG TERM GOAL #3   Title  Pt will improve gait velocity to >/= 2.5 ft/sec with appropriate AFO/Bioness and LRAD or no AD    Time  10    Period  Weeks    Status  Revised    Target Date  12/26/17      PT LONG TERM GOAL #4   Title  Pt will ambulate x 1000 over outdoor surfaces (paved and grassy) MOD I with most appropriate AFO and LRAD or no AD; will negotiate 12 stairs, one rails, alternating sequence MOD I    Time  10    Period  Weeks    Status  Revised    Target Date  12/26/17      PT LONG TERM GOAL #5   Title  Pt will demonstrate ability to transition floor <> stand MOD I and perform infant care activities (carry, load carrier, push stroller, in the floor to play or change diaper, etc) with AFO and LRAD/or no AD    Time  10    Period  Weeks     Status  New    Target Date  12/26/17      PT LONG TERM GOAL #6   Title  Pt will report being able to stand on sidelines at football practice and game with AFO and LRAD/or no AD with intermittent seated rest breaks and move away from players quickly without LOB    Time  10    Period  Weeks    Status  New    Target Date  12/26/17            Plan - 12/05/17 1256    Clinical Impression Statement  Treatment session focused on performing final updates to HEP and return to use of Bioness for gait training on treadmill and over ground with decreased UE support.  Following injections to LLE pt is demonstrating improved fluidity of LLE movement, decreased resistance to hip flexion, increased hip and knee flexion during swing and improved step and stride length.  Will continue to address in order to progress towards LTG and maximize functional gains before cervical spine surgery.    Clinical Impairments Affecting Rehab Potential  will have to have cervical laminectomy soon    PT Frequency  3x / week    PT Duration  Other (comment)    PT Treatment/Interventions  ADLs/Self Care Home Management;Cryotherapy;Electrical Stimulation;Moist Heat;DME Instruction;Gait Scientist, forensic;Therapeutic activities;Functional mobility training;Therapeutic exercise;Balance training;Neuromuscular re-education;Patient/family education;Orthotic Fit/Training;Passive range of motion;Taping;Aquatic Therapy    PT Next Visit Plan  continue to focus on weight shift and WB through LLE for gait/stairs, narrow BOS.  Bioness LLE with treadmill; gait without AD    PT Home Exercise Plan  [id number]  ; plan to add aquatic exercise pictures to HEP as pt states he plans to go to [facility]        Patient will benefit from skilled therapeutic intervention in order to improve the following deficits and impairments:  Abnormal gait, Decreased balance, Decreased coordination, Decreased strength, Difficulty walking, Increased muscle spasms,  Impaired sensation, Pain, Impaired tone, Decreased range of motion  Visit Diagnosis: Other abnormalities of gait and mobility  Unsteadiness  Other symptoms and signs involving the nervous system  Muscle weakness (generalized)  Other disturbances of skin sensation  Paraplegia, incomplete ([place])     Problem List Patient Active Problem List   Diagnosis Date Noted  . Paraplegia ([place]) 11/07/2017  . Quadriplegia, unspecified ([place]) 11/07/2017  . Leukocytosis   . Neuropathic pain   . Drug induced constipation   . Urinary retention   . Class 1 obesity due to excess calories with serious comorbidity and body mass index (BMI) of 33.0 to 33.9 in adult   . Myelopathy ([place]) 08/01/2017  . Thoracic spinal stenosis 07/31/2017    Rico Junker, PT, DPT 12/05/17    1:04 PM    [facility] [address] [place], [place], [address] Phone: [phone]   Fax:  [phone]  Name: CULLEN LAHAIE MRN: [id number] Date of Birth: 08-02-1985

## 2017-12-06 ENCOUNTER — Encounter: Payer: Self-pay | Admitting: Physical Therapy

## 2017-12-06 ENCOUNTER — Ambulatory Visit: Payer: 59 | Admitting: Physical Therapy

## 2017-12-06 DIAGNOSIS — R2689 Other abnormalities of gait and mobility: Secondary | ICD-10-CM

## 2017-12-06 DIAGNOSIS — M6281 Muscle weakness (generalized): Secondary | ICD-10-CM | POA: Diagnosis not present

## 2017-12-06 DIAGNOSIS — R208 Other disturbances of skin sensation: Secondary | ICD-10-CM

## 2017-12-06 DIAGNOSIS — G8222 Paraplegia, incomplete: Secondary | ICD-10-CM | POA: Diagnosis not present

## 2017-12-06 DIAGNOSIS — R2681 Unsteadiness on feet: Secondary | ICD-10-CM

## 2017-12-06 DIAGNOSIS — R29818 Other symptoms and signs involving the nervous system: Secondary | ICD-10-CM | POA: Diagnosis not present

## 2017-12-06 NOTE — Therapy (Signed)
[facility] [address] [place], [place], [address] Phone: [phone]   Fax:  [phone]  Physical Therapy Treatment  Patient Details  Name: Scott Lane MRN: [id number] Date of Birth: November 09, 1985 Referring Provider: Delice Lesch, MD   Encounter Date: 12/06/2017  PT End of Session - 12/06/17 0913    Visit Number  37    Number of Visits  65    Date for PT Re-Evaluation  12/26/17    Authorization Type  [employer] Deductible.  VL: [place]    PT Start Time  0802    PT Stop Time  0848    PT Time Calculation (min)  46 min    Activity Tolerance  Patient tolerated treatment well    Behavior During Therapy  WFL for tasks assessed/performed       Past Medical History:  Diagnosis Date  . Chicken pox     Past Surgical History:  Procedure Laterality Date  . LUMBAR LAMINECTOMY/DECOMPRESSION MICRODISCECTOMY N/A 08/01/2017   Procedure: Thoracic nine, ten, eleven, twelve laminectomy;  Surgeon: Eustace Moore, MD;  Location: [facility];  Service: Neurosurgery;  Laterality: N/A;  . none    . WISDOM TOOTH EXTRACTION     bilateral lower.    There were no vitals filed for this visit.  Subjective Assessment - 12/06/17 0806    Subjective  Wife would like to go to family reunion this weekend but pt is hesitant to go due to decreased mobility.      Pertinent History  None - very active    Limitations  Standing;Walking    How long can you stand comfortably?  8-10 minutes    How long can you walk comfortably?  uses cane and RW household distance, RW for community distances    Diagnostic tests  MRI     Patient Stated Goals  To be more independent at home and improve LE strength    Currently in Pain?  No/denies                       [facility] Adult PT Treatment/Exercise - 12/06/17 0835      Self-Care   Self-Care  Scar Mobilizations    Scar Mobilizations  of incision at mid back: discussed role of scar massage and  educated pt on how wife can perform gentle uni-directional and cross friction massage of incision.      Neuro Re-ed    Neuro Re-ed Details   Bioness in training mode: began on knees in tall kneeling with bilat Ue support on swiss ball performing forward roll outs x 10 and then with L/R bias for increased lateral weight shifting from pelvis x 8 per side with intermittent rest breaks to stretch mid and low back.  Transitioned to standing with LUE support on swiss ball on mat with staggered stance, L foot forwards.  With Bioness in gait mode performed forward, L lateral weight shifting onto LLE and maintaining trunk elongation and LLE extension while bringing RLE into hip and knee flexion up onto mat x 3-5 reps with mod A.        Modalities   Modalities  Electrical engineer Stimulation Location  L anterior tibialis and L hamstring    Electrical Stimulation Action  closed and open chain ankle DF, hip extension and knee flexion    Electrical Stimulation Parameters  Tablet 2 saved parameters; steering electrode  Electrical Stimulation Goals  Strength;Neuromuscular facilitation             PT Education - 12/06/17 0912    Education provided  Yes    Education Details  scar massage    Person(s) Educated  Patient    Methods  Explanation    Comprehension  Verbalized understanding       PT Short Term Goals - 11/21/17 0824      PT SHORT TERM GOAL #1   Title  Pt will improve standing balance as indicated by increased BERG balance score to >/= 44/56    Baseline  44/56    Time  5    Period  Weeks    Status  Achieved      PT SHORT TERM GOAL #2   Title  Pt will participate in initial fitting and training of AFO for indoor/outdoor surfaces with cane    Baseline  performed assessment with bracing but pt is not open to using right now    Time  5    Period  Weeks    Status  Achieved      PT SHORT TERM GOAL #3   Title  Pt will improve five time  sit <> stand from arm chair to </= 15 seconds with supervision without use of UE to demonstrate increased functional LE strength    Baseline  --    Time  5    Period  Weeks    Status  Achieved      PT SHORT TERM GOAL #4   Title  Pt will improve gait velocity with RW to >/= 1.8 ft/sec with cane and AFO    Baseline  1.3 with cane, no AFO    Time  5    Period  Weeks    Status  Not Met      PT SHORT TERM GOAL #5   Title  Pt will demonstrate ability to ambulate safely carry baby carrier over indoor surfaces with cane and supervision and up/down stairs with one rail    Baseline  min A up/down stairs with one rail without carrying carrier    Time  5    Period  Weeks    Status  Not Met        PT Long Term Goals - 10/09/17 1727      PT LONG TERM GOAL #1   Title  Pt will be independent with balance and LE strengthening HEP    Time  10    Period  Weeks    Status  Revised    Target Date  12/26/17      PT LONG TERM GOAL #2   Title  Pt will improve standing balance and BERG >/= 50/56 to decrease falls risk    Time  10    Period  Weeks    Status  Revised    Target Date  12/26/17      PT LONG TERM GOAL #3   Title  Pt will improve gait velocity to >/= 2.5 ft/sec with appropriate AFO/Bioness and LRAD or no AD    Time  10    Period  Weeks    Status  Revised    Target Date  12/26/17      PT LONG TERM GOAL #4   Title  Pt will ambulate x 1000 over outdoor surfaces (paved and grassy) MOD I with most appropriate AFO and LRAD or no AD; will negotiate 12 stairs, one rails, alternating sequence MOD  I    Time  10    Period  Weeks    Status  Revised    Target Date  12/26/17      PT LONG TERM GOAL #5   Title  Pt will demonstrate ability to transition floor <> stand MOD I and perform infant care activities (carry, load carrier, push stroller, in the floor to play or change diaper, etc) with AFO and LRAD/or no AD    Time  10    Period  Weeks    Status  New    Target Date  12/26/17      PT  LONG TERM GOAL #6   Title  Pt will report being able to stand on sidelines at football practice and game with AFO and LRAD/or no AD with intermittent seated rest breaks and move away from players quickly without LOB    Time  10    Period  Weeks    Status  New    Target Date  12/26/17            Plan - 12/06/17 0914    Clinical Impression Statement  Continued NMR with Bioness in training mode for hamstring activation and weight shift training from pelvis with trunk elongation and shortening.  During weight shifts pt reported some "pinching" in mid back.  Educated pt on scar massage to improve mobility of soft tissue.  Transitioned NMR to standing with pt continuing to demonstrate increased compensatory use of trunk flexion for weight shift to LLE.  Pt fatigued quickly and continues to have very guarded movement patterns.  Will continue to address to reach LTG.    Clinical Impairments Affecting Rehab Potential  will have to have cervical laminectomy soon    PT Frequency  3x / week    PT Duration  Other (comment)    PT Treatment/Interventions  ADLs/Self Care Home Management;Cryotherapy;Electrical Stimulation;Moist Heat;DME Instruction;Gait Scientist, forensic;Therapeutic activities;Functional mobility training;Therapeutic exercise;Balance training;Neuromuscular re-education;Patient/family education;Orthotic Fit/Training;Passive range of motion;Taping;Aquatic Therapy    PT Next Visit Plan  continue to focus on weight shift and WB through LLE for gait/stairs, narrow BOS.  Bioness LLE with treadmill; gait without AD.  Tall and half kneeling as able; weight shifting from pelvis    PT Home Exercise Plan  [id number]  ; plan to add aquatic exercise pictures to HEP as pt states he plans to go to [facility]        Patient will benefit from skilled therapeutic intervention in order to improve the following deficits and impairments:  Abnormal gait, Decreased balance, Decreased coordination, Decreased strength,  Difficulty walking, Increased muscle spasms, Impaired sensation, Pain, Impaired tone, Decreased range of motion  Visit Diagnosis: Other abnormalities of gait and mobility  Unsteadiness  Other symptoms and signs involving the nervous system  Muscle weakness (generalized)  Other disturbances of skin sensation     Problem List Patient Active Problem List   Diagnosis Date Noted  . Paraplegia ([place]) 11/07/2017  . Quadriplegia, unspecified ([place]) 11/07/2017  . Leukocytosis   . Neuropathic pain   . Drug induced constipation   . Urinary retention   . Class 1 obesity due to excess calories with serious comorbidity and body mass index (BMI) of 33.0 to 33.9 in adult   . Myelopathy ([place]) 08/01/2017  . Thoracic spinal stenosis 07/31/2017    Rico Junker, PT, DPT 12/06/17    9:18 AM    [facility] [address] [place], [place], [address] Phone:  [phone]   Fax:  [phone]  Name: Scott Lane MRN: [id number] Date of Birth: 03-15-1986

## 2017-12-10 ENCOUNTER — Ambulatory Visit: Payer: 59 | Attending: Physical Medicine & Rehabilitation | Admitting: Physical Therapy

## 2017-12-10 ENCOUNTER — Encounter: Payer: Self-pay | Admitting: Physical Therapy

## 2017-12-10 DIAGNOSIS — R208 Other disturbances of skin sensation: Secondary | ICD-10-CM | POA: Diagnosis not present

## 2017-12-10 DIAGNOSIS — M546 Pain in thoracic spine: Secondary | ICD-10-CM | POA: Insufficient documentation

## 2017-12-10 DIAGNOSIS — R29818 Other symptoms and signs involving the nervous system: Secondary | ICD-10-CM | POA: Diagnosis not present

## 2017-12-10 DIAGNOSIS — G8222 Paraplegia, incomplete: Secondary | ICD-10-CM | POA: Diagnosis not present

## 2017-12-10 DIAGNOSIS — R2689 Other abnormalities of gait and mobility: Secondary | ICD-10-CM | POA: Diagnosis not present

## 2017-12-10 DIAGNOSIS — M6281 Muscle weakness (generalized): Secondary | ICD-10-CM | POA: Insufficient documentation

## 2017-12-10 DIAGNOSIS — R2681 Unsteadiness on feet: Secondary | ICD-10-CM | POA: Diagnosis not present

## 2017-12-10 NOTE — Therapy (Signed)
[facility] [address] [place], [place], [address] Phone: [phone]   Fax:  [phone]  Physical Therapy Treatment  Patient Details  Name: Scott Lane MRN: [id number] Date of Birth: 1986-02-13 Referring Provider: Delice Lesch, MD   Encounter Date: 12/10/2017  PT End of Session - 12/10/17 2048    Visit Number  38    Number of Visits  47    Date for PT Re-Evaluation  12/26/17    Authorization Type  [place] Family Deductible.  VL: MN    PT Start Time  0806    PT Stop Time  0850    PT Time Calculation (min)  44 min       Past Medical History:  Diagnosis Date  . Chicken pox     Past Surgical History:  Procedure Laterality Date  . LUMBAR LAMINECTOMY/DECOMPRESSION MICRODISCECTOMY N/A 08/01/2017   Procedure: Thoracic nine, ten, eleven, twelve laminectomy;  Surgeon: Eustace Moore, MD;  Location: [facility];  Service: Neurosurgery;  Laterality: N/A;  . none    . WISDOM TOOTH EXTRACTION     bilateral lower.    There were no vitals filed for this visit.  Subjective Assessment - 12/10/17 2026    Subjective  Pt states they went to family reunion in [place] this weekend; pt continues to report he has stiffness in LLE    Patient Stated Goals  To be more independent at home and improve LE strength    Currently in Pain?  No/denies                       [facility] Adult PT Treatment/Exercise - 12/10/17 0816      Exercises   Exercises  Knee/Hip      Knee/Hip Exercises: Stretches   Gastroc Stretch  Right;Left;1 rep;30 seconds   in standing with 2" step on // bar floor   Other Knee/Hip Stretches  Runner's stretch bil. LE's 30 sec hold x 1 reps each      Knee/Hip Exercises: Aerobic   Recumbent Bike  SciFit level 1.5 x 5"      Knee/Hip Exercises: Standing   Heel Raises  Both;Left;1 set;10 reps   10 reps bil. LE's:  LLE only in standing 10 reps   Hip Flexion  Stengthening;Left;1 set;10 reps;Knee bent    green theraband for LLE hip flexor strengthening in standing   Hip Extension  Stengthening;Left;1 set;10 reps   with green theraband   Other Standing Knee Exercises  Pt performed sit to stand exercise with RLE on balance bubble      Knee/Hip Exercises: Supine   Single Leg Bridge  AROM;Left;1 set;10 reps      Knee/Hip Exercises: Prone   Hamstring Curl  1 set;10 reps   2# used on LLE; No weight used on LLE 10 reps quickly    Hamstring Curl Limitations  weakness at end ROM - has difficulty flexing past 90 degrees    Hip Extension  AAROM;Left;1 set;10 reps   Lt knee flexed at 90 degrees     Tall kneeling - sitting back toward heels as tolerated - holding 20 secs for quad stretch; cues to squeeze gluts with  return to upright position; UE support on green physioball for balance           PT Short Term Goals - 11/21/17 0824      PT SHORT TERM GOAL #1   Title  Pt will improve standing balance  as indicated by increased BERG balance score to >/= 44/56    Baseline  44/56    Time  5    Period  Weeks    Status  Achieved      PT SHORT TERM GOAL #2   Title  Pt will participate in initial fitting and training of AFO for indoor/outdoor surfaces with cane    Baseline  performed assessment with bracing but pt is not open to using right now    Time  5    Period  Weeks    Status  Achieved      PT SHORT TERM GOAL #3   Title  Pt will improve five time sit <> stand from arm chair to </= 15 seconds with supervision without use of UE to demonstrate increased functional LE strength    Baseline  --    Time  5    Period  Weeks    Status  Achieved      PT SHORT TERM GOAL #4   Title  Pt will improve gait velocity with RW to >/= 1.8 ft/sec with cane and AFO    Baseline  1.3 with cane, no AFO    Time  5    Period  Weeks    Status  Not Met      PT SHORT TERM GOAL #5   Title  Pt will demonstrate ability to ambulate safely carry baby carrier over indoor surfaces with cane and supervision and  up/down stairs with one rail    Baseline  min A up/down stairs with one rail without carrying carrier    Time  5    Period  Weeks    Status  Not Met        PT Long Term Goals - 10/09/17 1727      PT LONG TERM GOAL #1   Title  Pt will be independent with balance and LE strengthening HEP    Time  10    Period  Weeks    Status  Revised    Target Date  12/26/17      PT LONG TERM GOAL #2   Title  Pt will improve standing balance and BERG >/= 50/56 to decrease falls risk    Time  10    Period  Weeks    Status  Revised    Target Date  12/26/17      PT LONG TERM GOAL #3   Title  Pt will improve gait velocity to >/= 2.5 ft/sec with appropriate AFO/Bioness and LRAD or no AD    Time  10    Period  Weeks    Status  Revised    Target Date  12/26/17      PT LONG TERM GOAL #4   Title  Pt will ambulate x 1000 over outdoor surfaces (paved and grassy) MOD I with most appropriate AFO and LRAD or no AD; will negotiate 12 stairs, one rails, alternating sequence MOD I    Time  10    Period  Weeks    Status  Revised    Target Date  12/26/17      PT LONG TERM GOAL #5   Title  Pt will demonstrate ability to transition floor <> stand MOD I and perform infant care activities (carry, load carrier, push stroller, in the floor to play or change diaper, etc) with AFO and LRAD/or no AD    Time  10    Period  Weeks  Status  New    Target Date  12/26/17      PT LONG TERM GOAL #6   Title  Pt will report being able to stand on sidelines at football practice and game with AFO and LRAD/or no AD with intermittent seated rest breaks and move away from players quickly without LOB    Time  10    Period  Weeks    Status  New    Target Date  12/26/17            Plan - 12/10/17 2050    Clinical Impression Statement  Pt's LLE demonstrates decreased strength and decr. muscle endurance as evidenced by tremoring in LLE after performing resisted Lt hip flexion and extension with use of green theraband  at beginning of session.  Pt declined performing quadruped activity due to c/o back pain after performing this exercise in previous session.  Pt demonstrates decreased strength in Lt plantarflexors and hamstrings resulting in Lt genu recurvatum in Lt SLS.    Rehab Potential  Good    Clinical Impairments Affecting Rehab Potential  will have to have cervical laminectomy soon    PT Frequency  3x / week    PT Duration  Other (comment)    PT Treatment/Interventions  ADLs/Self Care Home Management;Cryotherapy;Electrical Stimulation;Moist Heat;DME Instruction;Gait Scientist, forensic;Therapeutic activities;Functional mobility training;Therapeutic exercise;Balance training;Neuromuscular re-education;Patient/family education;Orthotic Fit/Training;Passive range of motion;Taping;Aquatic Therapy    PT Next Visit Plan   side stepping with theraband for resistance (first, prior to fatigue):  continue to focus on weight shift and WB through LLE for gait/stairs, narrow BOS.  Bioness LLE with treadmill; gait without AD.  Tall and half kneeling as able; weight shifting from pelvis    PT Home Exercise Plan  [id number]  ; plan to add aquatic exercise pictures to HEP as pt states he plans to go to [employer] and Agree with Plan of Care  Patient       Patient will benefit from skilled therapeutic intervention in order to improve the following deficits and impairments:  Abnormal gait, Decreased balance, Decreased coordination, Decreased strength, Difficulty walking, Increased muscle spasms, Impaired sensation, Pain, Impaired tone, Decreased range of motion  Visit Diagnosis: Muscle weakness (generalized)  Other abnormalities of gait and mobility     Problem List Patient Active Problem List   Diagnosis Date Noted  . Paraplegia ([facility]) 11/07/2017  . Quadriplegia, unspecified ([facility]) 11/07/2017  . Leukocytosis   . Neuropathic pain   . Drug induced constipation   . Urinary retention   . Class 1 obesity due  to excess calories with serious comorbidity and body mass index (BMI) of 33.0 to 33.9 in adult   . Myelopathy ([facility]) 08/01/2017  . Thoracic spinal stenosis 07/31/2017    Alda Lea, PT 12/10/2017, 9:04 PM  [facility] [address] [place], [place], [address] Phone: [phone]   Fax:  [phone]  Name: Scott Lane MRN: [id number] Date of Birth: 10/19/1985

## 2017-12-11 ENCOUNTER — Ambulatory Visit: Payer: 59 | Admitting: Neurology

## 2017-12-11 ENCOUNTER — Encounter: Payer: Self-pay | Admitting: Physical Therapy

## 2017-12-11 ENCOUNTER — Ambulatory Visit: Payer: 59 | Admitting: Physical Therapy

## 2017-12-11 DIAGNOSIS — R2689 Other abnormalities of gait and mobility: Secondary | ICD-10-CM | POA: Diagnosis not present

## 2017-12-11 DIAGNOSIS — M6281 Muscle weakness (generalized): Secondary | ICD-10-CM | POA: Diagnosis not present

## 2017-12-11 DIAGNOSIS — R2681 Unsteadiness on feet: Secondary | ICD-10-CM | POA: Diagnosis not present

## 2017-12-11 DIAGNOSIS — G8222 Paraplegia, incomplete: Secondary | ICD-10-CM | POA: Diagnosis not present

## 2017-12-11 DIAGNOSIS — M546 Pain in thoracic spine: Secondary | ICD-10-CM | POA: Diagnosis not present

## 2017-12-11 DIAGNOSIS — R208 Other disturbances of skin sensation: Secondary | ICD-10-CM | POA: Diagnosis not present

## 2017-12-11 DIAGNOSIS — R29818 Other symptoms and signs involving the nervous system: Secondary | ICD-10-CM | POA: Diagnosis not present

## 2017-12-11 NOTE — Therapy (Signed)
[facility] [address] [place], [place], [address] Phone: [phone]   Fax:  [phone]  Physical Therapy Treatment  Patient Details  Name: Scott Lane MRN: [id number] Date of Birth: 12-18-1985 Referring Provider: Delice Lesch, MD   Encounter Date: 12/11/2017  PT End of Session - 12/11/17 2004    Visit Number  39    Number of Visits  47    Date for PT Re-Evaluation  12/26/17    Authorization Type  [place] Family Deductible.  VL: MN    PT Start Time  1520    PT Stop Time  1606    PT Time Calculation (min)  46 min    Equipment Utilized During Treatment  --   pool noodle used   Activity Tolerance  Patient tolerated treatment well    Behavior During Therapy  WFL for tasks assessed/performed       Past Medical History:  Diagnosis Date  . Chicken pox     Past Surgical History:  Procedure Laterality Date  . LUMBAR LAMINECTOMY/DECOMPRESSION MICRODISCECTOMY N/A 08/01/2017   Procedure: Thoracic nine, ten, eleven, twelve laminectomy;  Surgeon: Eustace Moore, MD;  Location: [facility];  Service: Neurosurgery;  Laterality: N/A;  . none    . WISDOM TOOTH EXTRACTION     bilateral lower.    There were no vitals filed for this visit.  Subjective Assessment - 12/11/17 1958    Subjective  Pt amb. from bleachers on side of pool to pool entrance (steps) with use of SPC; states he is doing well today. Pt entered pool by step descension modified independently with use of hand rail    Patient Stated Goals  To be more independent at home and improve LE strength    Currently in Pain?  No/denies                   Adult Aquatic Therapy - 12/11/17 2000      Aquatic Therapy Subjective   Subjective  Pt used SPC to amb. from pool side to steps for entrance - approx. 36' ; pt entered pool iwith use of 2 hand rails using a step by step sequence for descension      Treatment   Gait  Pt amb. in 3'-5' depth of water  without UE support on side of pool - forward, back and side directions 2 laps each direction: using buoyancy of water for support for balance    Exercises  Pt performed Lt hamstring stretch (runner's stretch ) x 30 sec hold x 2 reps for LLE stretching; Lt heel cord stretch 30 sec hold 2 reps ;  Pt performed standing Lt hip flexion, extension and abduction with Lt foot dorsiflexed  10 reps each using viscosity of water for resistance.    these stretches performed at end of session also    Specific Exercises  Hip/Low Back    Balance  Standing with feet together for NBOS - used dumbbells moving UE's in horizontal abduction/adduction and then protraction/rectraction for core stabilization 10 reps each direction       Pt performed standing knee flexion on each leg - upper leg and knee blocked against pool wall to prevent substitution of hip flexors 10 reps with cues to flex as much as possible, then 10 reps with each LE - with increased speed for increased resistance of water (using Viscosity for resistance) Pt performed bil. Heel raises 10 reps; unilateral heel raise 10 reps each leg  Amb. Sideways on tiptoes with knees flexed 25' x 2 reps; amb. Forwards and backwards 25' x 1 rep with knees flexed and on tiptoes With use of noodle for assist with balance  Jogging in place - progressed to jogging 25' across pool 1/2 jumping jacks x 10 reps; lunges 5 reps without UE support; buoyancy of water used for off loading body decompressing spine    Pt stood with back against pool wall - used noodle - pushed noodle down toward pool floor with each leg for hip extensor strengthening and for  Improved SLS - 10 reps each leg; same position - knee extended with foot on noodle - knee flexion/extension 10 reps each leg for quad & hamstring Strengthening and for improved SLS on contralateral leg           PT Short Term Goals - 11/21/17 0824      PT SHORT TERM GOAL #1   Title  Pt will improve standing  balance as indicated by increased BERG balance score to >/= 44/56    Baseline  44/56    Time  5    Period  Weeks    Status  Achieved      PT SHORT TERM GOAL #2   Title  Pt will participate in initial fitting and training of AFO for indoor/outdoor surfaces with cane    Baseline  performed assessment with bracing but pt is not open to using right now    Time  5    Period  Weeks    Status  Achieved      PT SHORT TERM GOAL #3   Title  Pt will improve five time sit <> stand from arm chair to </= 15 seconds with supervision without use of UE to demonstrate increased functional LE strength    Baseline  --    Time  5    Period  Weeks    Status  Achieved      PT SHORT TERM GOAL #4   Title  Pt will improve gait velocity with RW to >/= 1.8 ft/sec with cane and AFO    Baseline  1.3 with cane, no AFO    Time  5    Period  Weeks    Status  Not Met      PT SHORT TERM GOAL #5   Title  Pt will demonstrate ability to ambulate safely carry baby carrier over indoor surfaces with cane and supervision and up/down stairs with one rail    Baseline  min A up/down stairs with one rail without carrying carrier    Time  5    Period  Weeks    Status  Not Met        PT Long Term Goals - 10/09/17 1727      PT LONG TERM GOAL #1   Title  Pt will be independent with balance and LE strengthening HEP    Time  10    Period  Weeks    Status  Revised    Target Date  12/26/17      PT LONG TERM GOAL #2   Title  Pt will improve standing balance and BERG >/= 50/56 to decrease falls risk    Time  10    Period  Weeks    Status  Revised    Target Date  12/26/17      PT LONG TERM GOAL #3   Title  Pt will improve gait velocity to >/= 2.5 ft/sec with  appropriate AFO/Bioness and LRAD or no AD    Time  10    Period  Weeks    Status  Revised    Target Date  12/26/17      PT LONG TERM GOAL #4   Title  Pt will ambulate x 1000 over outdoor surfaces (paved and grassy) MOD I with most appropriate AFO and LRAD or  no AD; will negotiate 12 stairs, one rails, alternating sequence MOD I    Time  10    Period  Weeks    Status  Revised    Target Date  12/26/17      PT LONG TERM GOAL #5   Title  Pt will demonstrate ability to transition floor <> stand MOD I and perform infant care activities (carry, load carrier, push stroller, in the floor to play or change diaper, etc) with AFO and LRAD/or no AD    Time  10    Period  Weeks    Status  New    Target Date  12/26/17      PT LONG TERM GOAL #6   Title  Pt will report being able to stand on sidelines at football practice and game with AFO and LRAD/or no AD with intermittent seated rest breaks and move away from players quickly without LOB    Time  10    Period  Weeks    Status  New    Target Date  12/26/17            Plan - 12/11/17 2005    Clinical Impression Statement  Pt continues to have incr. tone & spasticity in LLE resulting in decreased fluidity of movement and difficulty performing quick left knee flexion and extension;  pt reported difficulty with rapid marching in place, due to his report of being unable to control the rapid descent of LLE with eccentric hip flexion in standing.  Pt tolerating aquatic therapy very well and demonstrated ability to amb. and perform slow marching forward and backward across pool without use of pool noodle today for balance (he has requested use of noodle in previous sessions to assist with balance with this exercise).      Rehab Potential  Good    PT Frequency  3x / week    PT Duration  Other (comment)    PT Treatment/Interventions  ADLs/Self Care Home Management;Cryotherapy;Electrical Stimulation;Moist Heat;DME Instruction;Gait Scientist, forensic;Therapeutic activities;Functional mobility training;Therapeutic exercise;Balance training;Neuromuscular re-education;Patient/family education;Orthotic Fit/Training;Passive range of motion;Taping;Aquatic Therapy    PT Next Visit Plan   side stepping with theraband  for resistance (first, prior to fatigue):  (we did this ex. in pool on Wed.)  - side stepping on tip toes ;  continue to focus on weight shift and WB through LLE for gait/stairs, narrow BOS.  Bioness LLE with treadmill; gait without AD.  Tall and half kneeling as able; weight shifting from pelvis    PT Home Exercise Plan  [id number]  ; plan to add aquatic exercise pictures to HEP as pt states he plans to go to [employer] and Agree with Plan of Care  Patient       Patient will benefit from skilled therapeutic intervention in order to improve the following deficits and impairments:  Abnormal gait, Decreased balance, Decreased coordination, Decreased strength, Difficulty walking, Increased muscle spasms, Impaired sensation, Pain, Impaired tone, Decreased range of motion  Visit Diagnosis: Muscle weakness (generalized)  Other abnormalities of gait and mobility  Unsteadiness  Problem List Patient Active Problem List   Diagnosis Date Noted  . Paraplegia ([facility]) 11/07/2017  . Quadriplegia, unspecified ([facility]) 11/07/2017  . Leukocytosis   . Neuropathic pain   . Drug induced constipation   . Urinary retention   . Class 1 obesity due to excess calories with serious comorbidity and body mass index (BMI) of 33.0 to 33.9 in adult   . Myelopathy ([facility]) 08/01/2017  . Thoracic spinal stenosis 07/31/2017    [id number], [id number], PT 12/11/2017, 8:12 PM  [facility] [address] [place], [place], [address] Phone: [phone]   Fax:  [phone]  Name: Scott Lane MRN: [id number] Date of Birth: 07/10/1985

## 2017-12-12 ENCOUNTER — Encounter: Payer: Self-pay | Admitting: Physical Therapy

## 2017-12-12 ENCOUNTER — Ambulatory Visit: Payer: 59 | Admitting: Physical Therapy

## 2017-12-12 DIAGNOSIS — G8222 Paraplegia, incomplete: Secondary | ICD-10-CM | POA: Diagnosis not present

## 2017-12-12 DIAGNOSIS — R2689 Other abnormalities of gait and mobility: Secondary | ICD-10-CM

## 2017-12-12 DIAGNOSIS — M546 Pain in thoracic spine: Secondary | ICD-10-CM | POA: Diagnosis not present

## 2017-12-12 DIAGNOSIS — R2681 Unsteadiness on feet: Secondary | ICD-10-CM | POA: Diagnosis not present

## 2017-12-12 DIAGNOSIS — R208 Other disturbances of skin sensation: Secondary | ICD-10-CM

## 2017-12-12 DIAGNOSIS — R29818 Other symptoms and signs involving the nervous system: Secondary | ICD-10-CM | POA: Diagnosis not present

## 2017-12-12 DIAGNOSIS — M6281 Muscle weakness (generalized): Secondary | ICD-10-CM

## 2017-12-12 NOTE — Therapy (Signed)
[facility] [address] [place], [place], [address] Phone: [phone]   Fax:  [phone]  Physical Therapy Treatment  Patient Details  Name: Scott Lane MRN: [id number] Date of Birth: Nov 14, 1985 Referring Provider: Delice Lesch, MD   Encounter Date: 12/12/2017  PT End of Session - 12/12/17 0917    Visit Number  40    Number of Visits  98    Date for PT Re-Evaluation  12/26/17    Authorization Type  [place] [employer] Deductible.  VL: MN    PT Start Time  0800    PT Stop Time  0845    PT Time Calculation (min)  45 min    Equipment Utilized During Treatment  Gait belt    Activity Tolerance  Patient tolerated treatment well    Behavior During Therapy  WFL for tasks assessed/performed       Past Medical History:  Diagnosis Date  . Chicken pox     Past Surgical History:  Procedure Laterality Date  . LUMBAR LAMINECTOMY/DECOMPRESSION MICRODISCECTOMY N/A 08/01/2017   Procedure: Thoracic nine, ten, eleven, twelve laminectomy;  Surgeon: Eustace Moore, MD;  Location: [facility];  Service: Neurosurgery;  Laterality: N/A;  . none    . WISDOM TOOTH EXTRACTION     bilateral lower.    There were no vitals filed for this visit.  Subjective Assessment - 12/12/17 0806    Subjective  Pt reports no new complaints and enjoyed his pool workout yesterday.     Patient Stated Goals  To be more independent at home and improve LE strength    Currently in Pain?  No/denies    Pain Score  0-No pain                       [facility] Adult PT Treatment/Exercise - 12/12/17 0902      Transfers   Transfers  Sit to Stand;Stand to Sit;Stand Pivot Transfers    Sit to Stand  6: Modified independent (Device/Increase time);With upper extremity assist   using SPC   Stand to Sit  6: Modified independent (Device/Increase time);With upper extremity assist   uses [facility]     Ambulation/Gait   Ambulation/Gait  Yes    Ambulation/Gait  Assistance  4: Min guard    Ambulation/Gait Assistance Details  Pt performs all gait trials with bioness applied to L LE. Pt performs forward walking on treadmill for 8 min duration on .9 speed with therapist providing manual facilitation to L LE for optimizing hip extension during terminal stance, knee flexion during pre swing, and DF during initial contact. Pt progressively dec UE support as tolerated. Progressed Pt to retrograde walking on treadmill to dec L LE knee hyperextension for 6 min duration on .4 speed with therapist providing the same manual facilitation. Mirror was utilized to provide Pt with visual feedback. Pt progresses to overground walking on level surface to assess carryover and ambulates 115 feet with no AD and therapist providing min guard providing VC's to increase stride length and inc L LE hip extension in terminal stance. Pt performs x1 trial of 230 ft with 2nd helper using poles for Pt B UE's to faciliate arm swing during gait to optimize extremity sequencing with ambulation     Ambulation Distance (Feet)  230 Feet   x1 trial of 115 ft with rest break; x1 trial of 230 ft   Assistive device  None    Gait Pattern  Decreased arm  swing - right;Decreased arm swing - left;Step-through pattern;Decreased step length - right;Decreased step length - left;Decreased stride length;Decreased dorsiflexion - left;Decreased dorsiflexion - right;Decreased trunk rotation    Ambulation Surface  Level      Modalities   Modalities  Electrical Stimulation      Electrical Stimulation   Electrical Stimulation Location  L anterior tibialis and L hamstring    Electrical Stimulation Action  closed chain ankle DF, knee flexion, hip extension     Electrical Stimulation Parameters  see saved parameters     Electrical Stimulation Goals  Strength;Neuromuscular facilitation             PT Education - 12/12/17 [phone]    Education provided  Yes    Education Details  optimal gait pattern with arm  swing facilitation, POC after next surgery     Person(s) Educated  Patient    Methods  Explanation    Comprehension  Verbalized understanding       PT Short Term Goals - 11/21/17 0824      PT SHORT TERM GOAL #1   Title  Pt will improve standing balance as indicated by increased BERG balance score to >/= 44/56    Baseline  44/56    Time  5    Period  Weeks    Status  Achieved      PT SHORT TERM GOAL #2   Title  Pt will participate in initial fitting and training of AFO for indoor/outdoor surfaces with cane    Baseline  performed assessment with bracing but pt is not open to using right now    Time  5    Period  Weeks    Status  Achieved      PT SHORT TERM GOAL #3   Title  Pt will improve five time sit <> stand from arm chair to </= 15 seconds with supervision without use of UE to demonstrate increased functional LE strength    Baseline  --    Time  5    Period  Weeks    Status  Achieved      PT SHORT TERM GOAL #4   Title  Pt will improve gait velocity with RW to >/= 1.8 ft/sec with cane and AFO    Baseline  1.3 with cane, no AFO    Time  5    Period  Weeks    Status  Not Met      PT SHORT TERM GOAL #5   Title  Pt will demonstrate ability to ambulate safely carry baby carrier over indoor surfaces with cane and supervision and up/down stairs with one rail    Baseline  min A up/down stairs with one rail without carrying carrier    Time  5    Period  Weeks    Status  Not Met        PT Long Term Goals - 12/12/17 1129      PT LONG TERM GOAL #1   Title  Pt will be independent with balance and LE strengthening HEP    Time  10    Period  Weeks    Status  Revised    Target Date  12/26/17      PT LONG TERM GOAL #2   Title  Pt will improve standing balance and BERG >/= 50/56 to decrease falls risk    Time  10    Period  Weeks    Status  Revised    Target  Date  12/26/17      PT LONG TERM GOAL #3   Title  Pt will improve gait velocity to >/= 2.5 ft/sec with  appropriate AFO/Bioness and LRAD or no AD    Time  10    Period  Weeks    Status  Revised    Target Date  12/26/17      PT LONG TERM GOAL #4   Title  Pt will ambulate x 1000 over outdoor surfaces (paved and grassy) MOD I with most appropriate AFO and LRAD or no AD; will negotiate 12 stairs, one rails, alternating sequence MOD I    Time  10    Period  Weeks    Status  Revised    Target Date  12/26/17      PT LONG TERM GOAL #5   Title  Pt will demonstrate ability to transition floor <> stand MOD I and perform infant care activities (carry, load carrier, push stroller, in the floor to play or change diaper, etc) with LRAD/    Time  10    Period  Weeks    Status  Revised    Target Date  12/26/17      PT LONG TERM GOAL #6   Title  Pt will report being able to stand on sidelines at football practice and game with AFO and LRAD/or no AD with intermittent seated rest breaks and move away from players quickly without LOB    Baseline  Deferred - 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 Pt tolerated session well and therapist incorporated arm swing using UE poles and will continue to progress Pt towards optimal gait pattern with dec assistance. Pt demonstrates difficulty with diagonal weight shifting during gait and demonstrates increased lateral weight shift which dec step length and stride length bilaterally. Pt will continue to benefit from skilled PT to further progress Pt towards achieving optimal gait pattern and progress towards achieving therapy goals.     Rehab  Potential  Good    PT Frequency  3x / week    PT Duration  Other (comment)    PT Treatment/Interventions  ADLs/Self Care Home Management;Cryotherapy;Electrical Stimulation;Moist Heat;DME Instruction;Gait Scientist, forensic;Therapeutic activities;Functional mobility training;Therapeutic exercise;Balance training;Neuromuscular re-education;Patient/family education;Orthotic Fit/Training;Passive range of motion;Taping;Aquatic Therapy    PT Next Visit Plan   side stepping with theraband for resistance (first, prior to fatigue):  (we did this ex. in pool on Wed.)  - side stepping on tip toes ;  continue to focus on weight shift and WB through LLE for gait/stairs, narrow BOS.  Bioness LLE with treadmill; gait without AD.  Tall and half kneeling as able; weight shifting from pelvis    PT Home Exercise Plan  [id number]  ; plan to add aquatic exercise pictures to HEP as pt states he plans to go to [facility] and Agree with Plan of Care  Patient       Patient will benefit from skilled therapeutic intervention in order to improve the following  deficits and impairments:  Abnormal gait, Decreased balance, Decreased coordination, Decreased strength, Difficulty walking, Increased muscle spasms, Impaired sensation, Pain, Impaired tone, Decreased range of motion  Visit Diagnosis: Muscle weakness (generalized)  Other abnormalities of gait and mobility  Unsteadiness  Other symptoms and signs involving the nervous system  Other disturbances of skin sensation  Paraplegia, incomplete [facility])     Problem List Patient Active Problem List   Diagnosis Date Noted  . Paraplegia ([place]) 11/07/2017  . Quadriplegia, unspecified ([place]) 11/07/2017  . Leukocytosis   . Neuropathic pain   . Drug induced constipation   . Urinary retention   . Class 1 obesity due to excess calories with serious comorbidity and body mass index (BMI) of 33.0 to 33.9 in adult   . Myelopathy ([place]) 08/01/2017  . Thoracic spinal  stenosis 07/31/2017    Floreen Comber, SPT 12/12/2017, 9:25 AM  [facility] [address] [place], [place], [address] Phone: [phone]   Fax:  [phone]  Name: ENRICO EADDY MRN: [id number] Date of Birth: 07-29-1985

## 2017-12-16 ENCOUNTER — Other Ambulatory Visit: Payer: Self-pay | Admitting: Neurological Surgery

## 2017-12-16 ENCOUNTER — Encounter: Payer: Self-pay | Admitting: Physical Therapy

## 2017-12-16 ENCOUNTER — Ambulatory Visit: Payer: 59 | Admitting: Physical Therapy

## 2017-12-16 DIAGNOSIS — M6281 Muscle weakness (generalized): Secondary | ICD-10-CM

## 2017-12-16 DIAGNOSIS — G8222 Paraplegia, incomplete: Secondary | ICD-10-CM | POA: Diagnosis not present

## 2017-12-16 DIAGNOSIS — R2681 Unsteadiness on feet: Secondary | ICD-10-CM

## 2017-12-16 DIAGNOSIS — R208 Other disturbances of skin sensation: Secondary | ICD-10-CM

## 2017-12-16 DIAGNOSIS — R2689 Other abnormalities of gait and mobility: Secondary | ICD-10-CM

## 2017-12-16 DIAGNOSIS — M4804 Spinal stenosis, thoracic region: Secondary | ICD-10-CM | POA: Diagnosis not present

## 2017-12-16 DIAGNOSIS — M546 Pain in thoracic spine: Secondary | ICD-10-CM | POA: Diagnosis not present

## 2017-12-16 DIAGNOSIS — R29818 Other symptoms and signs involving the nervous system: Secondary | ICD-10-CM

## 2017-12-16 NOTE — Therapy (Signed)
[facility] [address] [place], [place], [address] Phone: [phone]   Fax:  [phone]  Physical Therapy Treatment  Patient Details  Name: Scott Lane MRN: [id number] Date of Birth: 1985/09/26 Referring Provider: Delice Lesch, MD   Encounter Date: 12/16/2017  PT End of Session - 12/16/17 0910    Visit Number  41    Number of Visits  47    Date for PT Re-Evaluation  12/26/17    Authorization Type  [place] Family Deductible.  VL: MN    PT Start Time  0800    PT Stop Time  0845    PT Time Calculation (min)  45 min    Equipment Utilized During Treatment  Gait belt    Activity Tolerance  Patient tolerated treatment well    Behavior During Therapy  WFL for tasks assessed/performed       Past Medical History:  Diagnosis Date  . Chicken pox     Past Surgical History:  Procedure Laterality Date  . LUMBAR LAMINECTOMY/DECOMPRESSION MICRODISCECTOMY N/A 08/01/2017   Procedure: Thoracic nine, ten, eleven, twelve laminectomy;  Surgeon: Eustace Moore, MD;  Location: [facility];  Service: Neurosurgery;  Laterality: N/A;  . none    . WISDOM TOOTH EXTRACTION     bilateral lower.    There were no vitals filed for this visit.  Subjective Assessment - 12/16/17 0806    Subjective  Pt reports no new complaints and continues to perform his daily stretching routine. He reports he continues to have difficulty descending curbs with his cane requiring him to hold onto his car for increased upper extremity assist. Pt reports he as an appointment this afternoon with his doctor and will follow up with a date for his next surgery.     Patient Stated Goals  To be more independent at home and improve LE strength    Currently in Pain?  No/denies                       [facility] Adult PT Treatment/Exercise - 12/16/17 0855      Transfers   Transfers  Sit to Stand;Stand to Sit    Sit to Stand  6: Modified independent  (Device/Increase time);With upper extremity assist    Stand to Sit  6: Modified independent (Device/Increase time);With upper extremity assist      Ambulation/Gait   Ambulation/Gait  Yes    Ambulation/Gait Assistance  4: Min guard    Ambulation/Gait Assistance Details  supervision with SPC- step through sequence. Min guard for gait trials with no AD    Ambulation Distance (Feet)  230 Feet   230'; 230'; 115'   Assistive device  None;Straight cane   SPC progressing to no AD   Gait Pattern  Decreased arm swing - left;Step-through pattern;Decreased step length - right;Decreased step length - left;Decreased stride length;Decreased dorsiflexion - left;Decreased dorsiflexion - right;Decreased trunk rotation;Decreased stance time - left    Ambulation Surface  Level    Curb  4: Min assist    Curb Details (indicate cue type and reason)  Pt performed multiple trials ascending/ descending curb with SPC demonstrating proper sequencing. Pt continues to have difficulty with proper weight shift onto L LE demonstrating posterior COM relative to knee joint creating hyper extension moment to LLE with poor stability. Pt attempts to descend leading with SPC and R LE requiring min A 2/2 LOB episode. Pt performs additional attempt with therapist facilitating L  LE weight at pelvis level.     Gait Comments  Gait assessed indoors on level surfaces for 230' with Pt demonstrating dec L LE weight shift, and dec L hip/knee flexion during preswing to terminal swing. PT provided verbal cues for correct timing of L LE knee flexion with Pt performing multiple trials using counter top for support. During initial attempts, Pt initiated knee flexion too late during terminal swing simulating marching. Pt able to demonstrate good carry over with proper sequencing and timing during following attempts with less cueing required. Pt progressed to 66' with SPC on level surface focusing on correct sequencing of L LE hip and knee flexion with  cues to increase R LE step length and L LE stance time to optimize gait pattern. Pt then progressed to 29' with SPC ambulating while holding basket with 10# to simulate baby carrier.       Balance   Balance Assessed  Yes      Dynamic Standing Balance   Dynamic Standing - Balance Support  Right upper extremity supported    Dynamic Standing - Level of Assistance  4: Min assist    Dynamic Standing - Balance Activities  Other (comment)   Standing on R LE while tapping dots with L LE             PT Education - 12/16/17 0910    Education provided  Yes    Education Details  L LE weight shift during gait with arm swing; proper sequencing during ascending/ descending curb; POC    Person(s) Educated  Patient    Methods  Explanation    Comprehension  Verbalized understanding       PT Short Term Goals - 11/21/17 0824      PT SHORT TERM GOAL #1   Title  Pt will improve standing balance as indicated by increased BERG balance score to >/= 44/56    Baseline  44/56    Time  5    Period  Weeks    Status  Achieved      PT SHORT TERM GOAL #2   Title  Pt will participate in initial fitting and training of AFO for indoor/outdoor surfaces with cane    Baseline  performed assessment with bracing but pt is not open to using right now    Time  5    Period  Weeks    Status  Achieved      PT SHORT TERM GOAL #3   Title  Pt will improve five time sit <> stand from arm chair to </= 15 seconds with supervision without use of UE to demonstrate increased functional LE strength    Baseline  --    Time  5    Period  Weeks    Status  Achieved      PT SHORT TERM GOAL #4   Title  Pt will improve gait velocity with RW to >/= 1.8 ft/sec with cane and AFO    Baseline  1.3 with cane, no AFO    Time  5    Period  Weeks    Status  Not Met      PT SHORT TERM GOAL #5   Title  Pt will demonstrate ability to ambulate safely carry baby carrier over indoor surfaces with cane and supervision and up/down  stairs with one rail    Baseline  min A up/down stairs with one rail without carrying carrier    Time  5    Period  Weeks    Status  Not Met        PT Long Term Goals - 12/16/17 7829      PT LONG TERM GOAL #1   Title  Pt will be independent with balance and LE strengthening HEP    Time  10    Period  Weeks    Status  On-going    Target Date  12/26/17      PT LONG TERM GOAL #2   Title  Pt will improve standing balance and BERG >/= 50/56 to decrease falls risk    Baseline  44/56    Time  10    Period  Weeks    Status  Revised    Target Date  12/26/17      PT LONG TERM GOAL #3   Title  Pt will improve gait velocity to >/= 2.5 ft/sec with LRAD     Baseline  1.3 ft/sec - improved but not to goal level    Time  10    Period  Weeks    Status  Revised    Target Date  12/26/17      PT LONG TERM GOAL #4   Title  Pt will ambulate x 1000 over outdoor surfaces + curb (paved and grassy) MOD I with LRAD; will negotiate 12 stairs, one rail + cane, alternating sequence MOD I    Time  10    Period  Weeks    Status  Revised    Target Date  12/26/17      PT LONG TERM GOAL #5   Title  Pt will demonstrate ability to transition floor <> stand MOD I and perform infant care activities (carry, load carrier, push stroller, in the floor to play or change diaper, etc) with LRAD    Baseline  pt self reports that he is performing at home    Status  Achieved      PT LONG TERM GOAL #6   Title  Pt will report being able to stand on sidelines at football practice and game with AFO and LRAD/or no AD with intermittent seated rest breaks and move away from players quickly without LOB    Baseline  Deferred - 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 Pt requires verbal cues for proper timing to initiate knee and hip flexion during pre swing to terminal swing with good carry over. Pt demonstrates difficulty descending curbs due to dec LLE weight shifting to faciliate R LE step down.  Pt attempted to descend curb with R LE and SPC; however, unable to fully shift weight to his L LE requiring min guard for steadying. Pt continues to demonstrate dec confidence with L LE WB during functional mobility requiring cueing for reinforcement with little carry over between sessions. Pt will continue to benefit from skilled PT to address proper weight shifting during functional mobility tasks and dynamic balance to further progress towards goals.     Rehab Potential  Good    PT Frequency  3x / week    PT Duration  Other (comment)    PT Treatment/Interventions  ADLs/Self Care Home Management;Cryotherapy;Electrical Stimulation;Moist Heat;DME Instruction;Gait Scientist, forensic;Therapeutic activities;Functional mobility training;Therapeutic exercise;Balance training;Neuromuscular re-education;Patient/family education;Orthotic Fit/Training;Passive range of motion;Taping;Aquatic Therapy    PT Next Visit Plan  How did appointment with Dr. Ronnald Ramp go?  For pool - descending stairs.  standing on R LE tapping cones with LLE progressing to standing on block with R LE to simulate curb, curb practice, dynamic standing balance on compliant surface, ambulation with walking poles for arm swing, STS from ball for inc L LE weight shift, tall kneeling with wt shift on mat or on side, squats with ball on wall, eccentric quad on step, bridging, side stepping with theraband for resistance (first, prior to fatigue):  (we did this ex. in pool on Wed.)  - side stepping on tip toes ;  continue to focus on weight shift and WB through LLE for gait/stairs, narrow BOS.   Tall and half kneeling as able; weight shifting from pelvis     PT Home Exercise Plan  [id number]  ; plan to add aquatic exercise pictures to HEP as pt states he plans to go to [employer] and Agree with Plan of Care  Patient       Patient will benefit from skilled therapeutic intervention in order to improve the following deficits and impairments:  Abnormal gait, Decreased balance, Decreased coordination, Decreased strength, Difficulty walking, Increased muscle spasms, Impaired sensation, Pain, Impaired tone, Decreased range of motion  Visit Diagnosis: Muscle weakness (generalized)  Unsteadiness  Other abnormalities of gait and mobility  Other symptoms and signs involving the nervous system  Other disturbances of skin sensation  Paraplegia, incomplete ([place])     Problem List Patient Active Problem List   Diagnosis Date Noted  . Paraplegia ([place]) 11/07/2017  . Quadriplegia, unspecified ([place]) 11/07/2017  . Leukocytosis   . Neuropathic pain   . Drug induced constipation   . Urinary retention   . Class 1 obesity due to excess calories with serious comorbidity and body mass index (BMI) of 33.0 to 33.9 in adult   . Myelopathy ([place]) 08/01/2017  . Thoracic spinal stenosis 07/31/2017    Floreen Comber, SPT 12/16/2017, 9:24 AM  [facility] [address] [place], [place], [address] Phone: [phone]   Fax:  [phone]  Name: Scott Lane MRN: [id number] Date of Birth: 1985-11-16

## 2017-12-18 ENCOUNTER — Encounter: Payer: Self-pay | Admitting: Physical Therapy

## 2017-12-18 ENCOUNTER — Ambulatory Visit: Payer: 59 | Admitting: Physical Therapy

## 2017-12-18 DIAGNOSIS — G8222 Paraplegia, incomplete: Secondary | ICD-10-CM | POA: Diagnosis not present

## 2017-12-18 DIAGNOSIS — R2689 Other abnormalities of gait and mobility: Secondary | ICD-10-CM

## 2017-12-18 DIAGNOSIS — R208 Other disturbances of skin sensation: Secondary | ICD-10-CM | POA: Diagnosis not present

## 2017-12-18 DIAGNOSIS — M546 Pain in thoracic spine: Secondary | ICD-10-CM | POA: Diagnosis not present

## 2017-12-18 DIAGNOSIS — M6281 Muscle weakness (generalized): Secondary | ICD-10-CM

## 2017-12-18 DIAGNOSIS — R29818 Other symptoms and signs involving the nervous system: Secondary | ICD-10-CM | POA: Diagnosis not present

## 2017-12-18 DIAGNOSIS — R2681 Unsteadiness on feet: Secondary | ICD-10-CM | POA: Diagnosis not present

## 2017-12-18 NOTE — Therapy (Signed)
[facility] [address] [place], [place], [address] Phone: [phone]   Fax:  [phone]  Physical Therapy Treatment  Patient Details  Name: Scott Lane MRN: [id number] Date of Birth: July 01, 1985 Referring Provider: Delice Lesch, MD   Encounter Date: 12/18/2017  PT End of Session - 12/18/17 2022    Visit Number  42    Number of Visits  28    Date for PT Re-Evaluation  12/26/17    Authorization Type  [employer].  VL: MN    PT Start Time  1520    PT Stop Time  1605    PT Time Calculation (min)  45 min       Past Medical History:  Diagnosis Date  . Chicken pox     Past Surgical History:  Procedure Laterality Date  . LUMBAR LAMINECTOMY/DECOMPRESSION MICRODISCECTOMY N/A 08/01/2017   Procedure: Thoracic nine, ten, eleven, twelve laminectomy;  Surgeon: Eustace Moore, MD;  Location: [facility];  Service: Neurosurgery;  Laterality: N/A;  . none    . WISDOM TOOTH EXTRACTION     bilateral lower.    There were no vitals filed for this visit.  Subjective Assessment - 12/18/17 2020    Subjective  Pt states his surgery is scheduled for next Friday, 12-27-17:  states he will be at aquatic therapy next week for final time prior to surgery; wants to return after surgery when able - informed pt to ask MD for order and clearance when able to return to pool after surgery     Patient Stated Goals  To be more independent at home and improve LE strength    Currently in Pain?  No/denies      AQUATIC THERAPY SESSION;  Floreen Comber, SPT present during session;   POOL TEMP 87.5 degrees  Pt amb. With [facility] from bleachers to pool steps modified independently; entered pool with use of bil. Hand rails using step by step sequence in descension Pt exited pool at end of session by step ascension - used bil. Hand rails (reported LLE was very fatigued) using a step by step sequence with ascension  Pt performed stretches for bil.  LE's - runner's stretch and heel cord stretches - 30 sec hold - 2 reps each on each leg  Pt performed hip abduction 10 reps with UE support on pool wall for assistance with balance - RLE and LLE; 2nd set of 10 reps on each leg performed with  Increased speed to use current of water for resistance  Pt performed hip extension RLE and LLE - 10 reps with minimal UE support; pt then performed hip flexion/extension with knee flexed to 90 degrees - each knee held in flexion and moved leg forward and back for hip strengthening and for improved SLS on opposite limb  Pt performed jumping 10 reps without UE support; jumping jacks 10 reps without UE support; pt performed skaters approx. 10' to Rt side and then 10' to left side (lateral sidestepping with push off for plyometric exercise - recommended by Cara)  Braiding approx. 40' x 2 reps across pool without use of pool noodle for support (first time pt has performed this activity without UE support due to balance deficits)   Pt performed bil. Knee flexion - facing pool wall to prevent hip flexor compensation - 10 reps RLE and LLE; then 2nd set on LLE with increased speed  Pt performed Lt step down exercise on pool step with bil. UE  support for Lt quad eccentric strengthening and to facilitate anterior weight shift with forward progression over LLE - 10 reps  Lunges performed from bottom pool step - pt held onto bil. Hand rails - Rt foot on step with Lt foot forward on pool floor - lunging forward over LLE for quad strengthening and for weight shift forward Over LLE in descension   Pt performed hip strengthening exercises in supine position - noodle under arms for support with PT supporting pt and providing lift under pelvis for more flotation; pt performed hip abduction/adduction 10 reps; then 10 reps with hip abdct/adduction with alternate scissoring of LE's for coordination; hip flexion/extension 10 reps (bicyling LE's); ended with LE's adducted in  isometric exercise "don't let me move you" with lateral rotation with pt resisting with isometric contraction in LE's - for core stabilization  Pt performed stretches again at end of session - Runner's stretch, heel cord stretch and hamstring stretch with contract/relax for hamstring stretch with support of his LE provided by PT's leg to maintain placement on pool wall - 30 sec hold 1 rep for runner's stretch and heel cord stretch; 5 reps of contract/relax for bil. Hamstring stretching                      PT Short Term Goals - 11/21/17 7062      PT SHORT TERM GOAL #1   Title  Pt will improve standing balance as indicated by increased BERG balance score to >/= 44/56    Baseline  44/56    Time  5    Period  Weeks    Status  Achieved      PT SHORT TERM GOAL #2   Title  Pt will participate in initial fitting and training of AFO for indoor/outdoor surfaces with cane    Baseline  performed assessment with bracing but pt is not open to using right now    Time  5    Period  Weeks    Status  Achieved      PT SHORT TERM GOAL #3   Title  Pt will improve five time sit <> stand from arm chair to </= 15 seconds with supervision without use of UE to demonstrate increased functional LE strength    Baseline  --    Time  5    Period  Weeks    Status  Achieved      PT SHORT TERM GOAL #4   Title  Pt will improve gait velocity with RW to >/= 1.8 ft/sec with cane and AFO    Baseline  1.3 with cane, no AFO    Time  5    Period  Weeks    Status  Not Met      PT SHORT TERM GOAL #5   Title  Pt will demonstrate ability to ambulate safely carry baby carrier over indoor surfaces with cane and supervision and up/down stairs with one rail    Baseline  min A up/down stairs with one rail without carrying carrier    Time  5    Period  Weeks    Status  Not Met        PT Long Term Goals - 12/16/17 3762      PT LONG TERM GOAL #1   Title  Pt will be independent with balance and LE  strengthening HEP    Time  10    Period  Weeks    Status  On-going  Target Date  12/26/17      PT LONG TERM GOAL #2   Title  Pt will improve standing balance and BERG >/= 50/56 to decrease falls risk    Baseline  44/56    Time  10    Period  Weeks    Status  Revised    Target Date  12/26/17      PT LONG TERM GOAL #3   Title  Pt will improve gait velocity to >/= 2.5 ft/sec with LRAD     Baseline  1.3 ft/sec - improved but not to goal level    Time  10    Period  Weeks    Status  Revised    Target Date  12/26/17      PT LONG TERM GOAL #4   Title  Pt will ambulate x 1000 over outdoor surfaces + curb (paved and grassy) MOD I with LRAD; will negotiate 12 stairs, one rail + cane, alternating sequence MOD I    Time  10    Period  Weeks    Status  Revised    Target Date  12/26/17      PT LONG TERM GOAL #5   Title  Pt will demonstrate ability to transition floor <> stand MOD I and perform infant care activities (carry, load carrier, push stroller, in the floor to play or change diaper, etc) with LRAD    Baseline  pt self reports that he is performing at home    Status  Achieved      PT LONG TERM GOAL #6   Title  Pt will report being able to stand on sidelines at football practice and game with AFO and LRAD/or no AD with intermittent seated rest breaks and move away from players quickly without LOB    Baseline  Deferred - 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 UE support on hand rails on pool steps with performing step up exercise with LLE.  Overall pt demonstrating increased balance (performed braiding 30' across pool without use of noodle for UE support for assistance with balance for first time today.  Pt continues to report inability to  determine how hard LLE is coming down onto pool floor with plyometic exercises and especially with jogging in place.  Improvement noted with cues for smaller ROM and to maintain bil. knee flexion for more "gentle jogging".      Rehab Potential  Good    Clinical Impairments Affecting Rehab Potential  will have to have cervical laminectomy soon    PT Frequency  3x / week    PT Duration  Other (comment)    PT Next Visit Plan  How did appointment with Dr. Ronnald Ramp go?  For pool - descending stairs.  standing on R LE tapping cones with LLE progressing to standing on block with R LE to simulate curb, curb practice, dynamic standing balance on compliant surface, ambulation with walking poles for arm swing, STS from ball for inc L LE weight shift, tall kneeling with wt shift on mat or on side, squats with ball on wall, eccentric quad on step, bridging, side stepping with theraband for resistance (first, prior to fatigue):  (we did  this ex. in pool on Wed.)  - side stepping on tip toes ;  continue to focus on weight shift and WB through LLE for gait/stairs, narrow BOS.   Tall and half kneeling as able; weight shifting from pelvis    PT Home Exercise Plan  [id number]  ; plan to add aquatic exercise pictures to HEP as pt states he plans to go to [facility] - aquatic HEP issued 12-18-17     Consulted and Agree with Plan of Care  Patient       Patient will benefit from skilled therapeutic intervention in order to improve the following deficits and impairments:  Abnormal gait, Decreased balance, Decreased coordination, Decreased strength, Difficulty walking, Increased muscle spasms, Impaired sensation, Pain, Impaired tone, Decreased range of motion  Visit Diagnosis: Muscle weakness (generalized)  Other abnormalities of gait and mobility  Unsteadiness     Problem List Patient Active Problem List   Diagnosis Date Noted  . Paraplegia ([place]) 11/07/2017  . Quadriplegia, unspecified ([place]) 11/07/2017  . Leukocytosis   .  Neuropathic pain   . Drug induced constipation   . Urinary retention   . Class 1 obesity due to excess calories with serious comorbidity and body mass index (BMI) of 33.0 to 33.9 in adult   . Myelopathy ([place]) 08/01/2017  . Thoracic spinal stenosis 07/31/2017    Alda Lea, PT 12/18/2017, 8:29 PM  [facility] [address] [place], [place], [address] Phone: [phone]   Fax:  [phone]  Name: Scott Lane MRN: [id number] Date of Birth: March 29, 1986

## 2017-12-19 NOTE — Pre-Procedure Instructions (Signed)
Scott Lane  12/19/2017      [facility] [place] - [address] [place] [address] Phone: [phone] Fax: [phone]    Your procedure is scheduled on Friday September 20.  [facility] to [facility] Admitting at 12:40 [address].  Call this number if you have problems the morning of surgery:  [phone]   Remember:  Do not eat or drink after midnight.    Take these medicines the morning of surgery with A SIP OF WATER: baclofen (lioresal)  7 days prior to surgery STOP taking any Aspirin(unless otherwise instructed by your surgeon), Aleve, Naproxen, Ibuprofen, Motrin, Advil, Goody's, BC's, all herbal medications, fish oil, and all vitamins     Do not wear jewelry, make-up or nail polish.  Do not wear lotions, powders, or perfumes, or deodorant.  Do not shave 48 hours prior to surgery.  Men may shave face and neck.  Do not bring valuables to the hospital.  [facility] is not responsible for any belongings or valuables.  Contacts, dentures or bridgework may not be worn into surgery.  Leave your suitcase in the car.  After surgery it may be brought to your room.  For patients admitted to the hospital, discharge time will be determined by your treatment team.  Patients discharged the day of surgery will not be allowed to drive home.   Special instructions:    [facility]- Preparing For Surgery  Before surgery, you can play an important role. Because skin is not sterile, your skin needs to be as free of germs as possible. You can reduce the number of germs on your skin by washing with CHG (chlorahexidine gluconate) Soap before surgery.  CHG is an antiseptic cleaner which kills germs and bonds with the skin to continue killing germs even after washing.    Oral Hygiene is also important to reduce your risk of infection.  Remember - BRUSH YOUR TEETH THE MORNING OF SURGERY WITH YOUR REGULAR TOOTHPASTE  Please  do not use if you have an allergy to CHG or antibacterial soaps. If your skin becomes reddened/irritated stop using the CHG.  Do not shave (including legs and underarms) for at least 48 hours prior to first CHG shower. It is OK to shave your face.  Please follow these instructions carefully.   1. Shower the NIGHT BEFORE SURGERY and the MORNING OF SURGERY with CHG.   2. If you chose to wash your hair, wash your hair first as usual with your normal shampoo.  3. After you shampoo, rinse your hair and body thoroughly to remove the shampoo.  4. Use CHG as you would any other liquid soap. You can apply CHG directly to the skin and wash gently with a scrungie or a clean washcloth.   5. Apply the CHG Soap to your body ONLY FROM THE NECK DOWN.  Do not use on open wounds or open sores. Avoid contact with your eyes, ears, mouth and genitals (private parts). Wash Face and genitals (private parts)  with your normal soap.  6. Wash thoroughly, paying special attention to the area where your surgery will be performed.  7. Thoroughly rinse your body with warm water from the neck down.  8. DO NOT shower/wash with your normal soap after using and rinsing off the CHG Soap.  9. Pat yourself dry with a CLEAN TOWEL.  10. Wear CLEAN PAJAMAS to bed the night before surgery, wear comfortable clothes  the morning of surgery  11. Place CLEAN SHEETS on your bed the night of your first shower and DO NOT SLEEP WITH PETS.    Day of Surgery:  Do not apply any deodorants/lotions.  Please wear clean clothes to the hospital/surgery center.   Remember to brush your teeth WITH YOUR REGULAR TOOTHPASTE.    Please read over the following fact sheets that you were given. Coughing and Deep Breathing, MRSA Information and Surgical Site Infection Prevention

## 2017-12-20 ENCOUNTER — Encounter (HOSPITAL_COMMUNITY)
Admission: RE | Admit: 2017-12-20 | Discharge: 2017-12-20 | Disposition: A | Discharge status: Home / Self Care | Payer: 59 | Source: Ambulatory Visit | Attending: Neurological Surgery | Admitting: Neurological Surgery

## 2017-12-20 ENCOUNTER — Encounter (HOSPITAL_COMMUNITY): Payer: Self-pay

## 2017-12-20 ENCOUNTER — Encounter: Payer: Self-pay | Admitting: Physical Therapy

## 2017-12-20 ENCOUNTER — Other Ambulatory Visit: Payer: Self-pay

## 2017-12-20 ENCOUNTER — Ambulatory Visit: Payer: 59 | Admitting: Physical Therapy

## 2017-12-20 DIAGNOSIS — R2689 Other abnormalities of gait and mobility: Secondary | ICD-10-CM | POA: Diagnosis not present

## 2017-12-20 DIAGNOSIS — R2681 Unsteadiness on feet: Secondary | ICD-10-CM | POA: Diagnosis not present

## 2017-12-20 DIAGNOSIS — M6281 Muscle weakness (generalized): Secondary | ICD-10-CM

## 2017-12-20 DIAGNOSIS — Z01812 Encounter for preprocedural laboratory examination: Secondary | ICD-10-CM | POA: Insufficient documentation

## 2017-12-20 DIAGNOSIS — R29818 Other symptoms and signs involving the nervous system: Secondary | ICD-10-CM | POA: Diagnosis not present

## 2017-12-20 DIAGNOSIS — R208 Other disturbances of skin sensation: Secondary | ICD-10-CM | POA: Diagnosis not present

## 2017-12-20 DIAGNOSIS — G8222 Paraplegia, incomplete: Secondary | ICD-10-CM

## 2017-12-20 DIAGNOSIS — M546 Pain in thoracic spine: Secondary | ICD-10-CM | POA: Diagnosis not present

## 2017-12-20 LAB — BASIC METABOLIC PANEL
Anion gap: 9 (ref 5–15)
BUN: 11 mg/dL (ref 6–20)
CALCIUM: 9.5 mg/dL (ref 8.9–10.3)
CO2: 27 mmol/L (ref 22–32)
CREATININE: 1.15 mg/dL (ref 0.61–1.24)
Chloride: 104 mmol/L (ref 98–111)
Glucose, Bld: 95 mg/dL (ref 70–99)
Potassium: 3.9 mmol/L (ref 3.5–5.1)
SODIUM: 140 mmol/L (ref 135–145)

## 2017-12-20 LAB — CBC WITH DIFFERENTIAL/PLATELET
Abs Immature Granulocytes: 0 10*3/uL (ref 0.0–0.1)
BASOS ABS: 0.1 10*3/uL (ref 0.0–0.1)
Basophils Relative: 1 %
EOS ABS: 0.1 10*3/uL (ref 0.0–0.7)
EOS PCT: 1 %
HEMATOCRIT: 48 % (ref 39.0–52.0)
Hemoglobin: 15.9 g/dL (ref 13.0–17.0)
Immature Granulocytes: 0 %
Lymphocytes Relative: 30 %
Lymphs Abs: 1.2 10*3/uL (ref 0.7–4.0)
MCH: 28.4 pg (ref 26.0–34.0)
MCHC: 33.1 g/dL (ref 30.0–36.0)
MCV: 85.9 fL (ref 78.0–100.0)
Monocytes Absolute: 0.6 10*3/uL (ref 0.1–1.0)
Monocytes Relative: 14 %
NEUTROS PCT: 54 %
Neutro Abs: 2.1 10*3/uL (ref 1.7–7.7)
PLATELETS: 255 10*3/uL (ref 150–400)
RBC: 5.59 MIL/uL (ref 4.22–5.81)
RDW: 12.8 % (ref 11.5–15.5)
WBC: 4 10*3/uL (ref 4.0–10.5)

## 2017-12-20 LAB — SURGICAL PCR SCREEN
MRSA, PCR: NEGATIVE
STAPHYLOCOCCUS AUREUS: NEGATIVE

## 2017-12-20 LAB — PROTIME-INR
INR: 0.97
PROTHROMBIN TIME: 12.8 s (ref 11.4–15.2)

## 2017-12-20 NOTE — Therapy (Signed)
[facility] [address] [place], [place], [address] Phone: [phone]   Fax:  [phone]  Physical Therapy Treatment  Patient Details  Name: Scott Lane MRN: [id number] Date of Birth: 1985-08-24 Referring Provider: Delice Lesch, MD   Encounter Date: 12/20/2017  PT End of Session - 12/20/17 0930    Visit Number  43    Number of Visits  88    Date for PT Re-Evaluation  12/26/17    Authorization Type  [place] [employer] Deductible.  VL: [place]    PT Start Time  0800    PT Stop Time  0840    PT Time Calculation (min)  40 min    Activity Tolerance  Patient tolerated treatment well    Behavior During Therapy  WFL for tasks assessed/performed       Past Medical History:  Diagnosis Date  . Chicken pox     Past Surgical History:  Procedure Laterality Date  . LUMBAR LAMINECTOMY/DECOMPRESSION MICRODISCECTOMY N/A 08/01/2017   Procedure: Thoracic nine, ten, eleven, twelve laminectomy;  Surgeon: Eustace Moore, MD;  Location: [facility];  Service: Neurosurgery;  Laterality: N/A;  . none    . WISDOM TOOTH EXTRACTION     bilateral lower.    There were no vitals filed for this visit.  Subjective Assessment - 12/20/17 0805    Subjective  Pt reports he enjoyed aquatic therapy and reports his legs felt "tired" afterwards. Pt reports no new complaints.     Patient Stated Goals  To be more independent at home and improve LE strength    Currently in Pain?  No/denies         [facility] PT Assessment - 12/20/17 0822      ROM / Strength   AROM / PROM / Strength  --      Transfers   Transfers  Sit to Stand;Stand to Sit;Stand Pivot Transfers    Sit to Stand  6: Modified independent (Device/Increase time);With upper extremity assist    Stand to Sit  6: Modified independent (Device/Increase time);With upper extremity assist    Stand Pivot Transfers  6: Modified independent (Device/Increase time)    Comments  increased time with no UE  assist during STS and stand pivot transfers from standard height chair      Ambulation/Gait   Ambulation/Gait  Yes    Ambulation/Gait Assistance  5: Supervision;6: Modified independent (Device/Increase time)    Ambulation/Gait Assistance Details  Pt ambulates 500 feet with supervision on level surfaces using SPC. Pt requires CGA when ambulating on uneven grassy surface and descending curbs 2/2 dec LLE weight bearing causing unsteadiness. Pt attempts to increase gait speed to simulate crossing busy intersection but unable to demonstrate significant changes in gait speed when assessed     Ambulation Distance (Feet)  500 Feet    Assistive device  Straight cane    Gait Pattern  Decreased arm swing - left;Step-through pattern;Decreased step length - right;Decreased step length - left;Decreased stride length;Decreased dorsiflexion - left;Decreased dorsiflexion - right;Decreased trunk rotation;Decreased stance time - left    Ambulation Surface  Level;Unlevel;Indoor;Outdoor;Grass;Paved    Gait velocity  1.42 ft/sec using SPC   Previously assessed to be 1.3 ft/sec   Stairs  Yes    Stairs Assistance  5: Supervision;6: Modified independent (Device/Increase time)    Stairs Assistance Details (indicate cue type and reason)  Pt requires supervision to perform 4 steps with SPC and single HR with step to pattern. Pt able  to perform x8 steps with B HR's, recirpocal stepping pattern, and Mod I    Stair Management Technique  One rail Right;Step to pattern;With cane;Two rails;Alternating pattern    Number of Stairs  12   3 sets of 4 steps    Height of Stairs  6    Curb  5: Supervision    Curb Details (indicate cue type and reason)  Pt continues to be fearful performing WB through LLE due to fear of buckling when performing curbs requiring close supervision      Standardized Balance Assessment   Standardized Balance Assessment  Berg Balance Test      Berg Balance Test   Sit to Stand  Able to stand without using  hands and stabilize independently    Standing Unsupported  Able to stand safely 2 minutes    Sitting with Back Unsupported but Feet Supported on Floor or Stool  Able to sit safely and securely 2 minutes    Stand to Sit  Sits safely with minimal use of hands    Transfers  Able to transfer safely, minor use of hands    Standing Unsupported with Eyes Closed  Able to stand 10 seconds safely    Standing Ubsupported with Feet Together  Able to place feet together independently and stand 1 minute safely    From Standing, Reach Forward with Outstretched Arm  Can reach confidently >25 cm (10")    From Standing Position, Pick up Object from Floor  Able to pick up shoe safely and easily    From Standing Position, Turn to Look Behind Over each Shoulder  Looks behind from both sides and weight shifts well    Turn 360 Degrees  Needs close supervision or verbal cueing    Standing Unsupported, Alternately Place Feet on Step/Stool  Able to complete >2 steps/needs minimal assist   able to complete in 39 seconds with close SPV to min guard   Standing Unsupported, One Foot in Front  Able to plae foot ahead of the other independently and hold 30 seconds    Standing on One Leg  Able to lift leg independently and hold equal to or more than 3 seconds    Total Score  47    Berg comment:  47/56 indicating moderate fall risk potential                    [facility] Adult PT Treatment/Exercise - 12/20/17 0822      Exercises   Exercises  Knee/Hip      Knee/Hip Exercises: Stretches   Active Hamstring Stretch  Both;3 reps;30 seconds             PT Education - 12/20/17 0929    Education provided  Yes    Education Details  POC, continuing to WB through L LE during gait, questions to ask doctors regarding timeline for return to therapy s/p cervical surgery     Person(s) Educated  Patient    Methods  Explanation    Comprehension  Verbalized understanding       PT Short Term Goals - 11/21/17 0824       PT SHORT TERM GOAL #1   Title  Pt will improve standing balance as indicated by increased BERG balance score to >/= 44/56    Baseline  44/56    Time  5    Period  Weeks    Status  Achieved      PT SHORT TERM GOAL #2  Title  Pt will participate in initial fitting and training of AFO for indoor/outdoor surfaces with cane    Baseline  performed assessment with bracing but pt is not open to using right now    Time  5    Period  Weeks    Status  Achieved      PT SHORT TERM GOAL #3   Title  Pt will improve five time sit <> stand from arm chair to </= 15 seconds with supervision without use of UE to demonstrate increased functional LE strength    Baseline  --    Time  5    Period  Weeks    Status  Achieved      PT SHORT TERM GOAL #4   Title  Pt will improve gait velocity with RW to >/= 1.8 ft/sec with cane and AFO    Baseline  1.3 with cane, no AFO    Time  5    Period  Weeks    Status  Not Met      PT SHORT TERM GOAL #5   Title  Pt will demonstrate ability to ambulate safely carry baby carrier over indoor surfaces with cane and supervision and up/down stairs with one rail    Baseline  min A up/down stairs with one rail without carrying carrier    Time  5    Period  Weeks    Status  Not Met        PT Long Term Goals - 12/20/17 3748      PT LONG TERM GOAL #1   Title  Pt will be independent with balance and LE strengthening HEP    Status  Achieved      PT LONG TERM GOAL #2   Title  Pt will improve standing balance and BERG >/= 50/56 to decrease falls risk    Baseline  Pt scored 47/56 indicating moderate fall risk potential- improved but not to goal level    Status  Not Met      PT LONG TERM GOAL #3   Title  Pt will improve gait velocity to >/= 2.5 ft/sec with LRAD     Baseline  1.426 ft/sec- improved but not to goal level    Status  Not Met      PT LONG TERM GOAL #4   Title  Pt will ambulate x 1000 over outdoor surfaces + curb (paved and grassy) MOD I with LRAD; will  negotiate 12 stairs, one rail + cane, alternating sequence MOD I    Baseline  Pt ambulates outside with SPC and Mod I on level surfaces with therapist providing supervision on uneven grass surface and curbs 2/2 to increased unsteadiness and weakness to L LE when descending curbs. Pt able to ambulate 500 feet, partially achieving this goal     Status  Partially Met      PT LONG TERM GOAL #5   Title  Pt will demonstrate ability to transition floor <> stand MOD I and perform infant care activities (carry, load carrier, push stroller, in the floor to play or change diaper, etc) with LRAD    Baseline  pt self reports that he is performing at home    Status  Achieved      PT LONG TERM GOAL #6   Title  Pt will report being able to stand on sidelines at football practice and game with AFO and LRAD/or no AD with intermittent seated rest breaks and move away from players  quickly without LOB    Baseline  Deferred - 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 Pt achieved his goal of verbalizing independence and compliancy with routinely performing his HEP at home to further progress with LE strengthening and functional mobility goals. Pt partially achieved his community ambulation goal demonstrating ability to walk 500 feet with SPC mod I on level surfaces and supervision on grass surfaces due to continued balance deficits creating instability on compliant surfaces. Pt continues to demonstrate difficulty descending curbs, ascending/descending stairs with single HR and SPC, and varying gait speed when cued. Pt reports he continues to have difficulty WB through LLE and is fearful of experiencing knee buckling leading to increased fall potential. Pt did not meet his gait speed goal of improving speed to >2.5 ft/sec  with ability to demonstrate 1.42 ft/sec. Pt did not meet his Berg goal of increasing score to >50/56 although his Berg score improved to 47/56 indicating change in fall risk category from previous attempt from significant to moderate fall risk potential. Overall out of the 4 goals reassessed, Pt achieved 1, partially met 1, and did not meet 2. Pt will continue to benefit from skilled PT to address his strength, balance, and functional mobility limitations    Rehab Potential  Good    Clinical Impairments Affecting Rehab Potential  will have to have cervical laminectomy soon    PT Frequency  3x / week    PT Duration  Other (comment)    PT Next Visit Plan  finalize HEP, standing on R LE tapping cones with LLE progressing to standing on block with R LE to simulate curb, curb practice, dynamic standing balance on compliant surface, ambulation with walking poles for arm swing, STS from ball for inc L LE weight shift, tall kneeling with wt shift on mat or on side, squats with ball on wall, eccentric quad on step, bridging, side stepping with theraband for resistance (first, prior to fatigue):  (we did this ex. in pool on Wed.)  - side stepping on tip toes ;  continue to focus on weight shift and WB through LLE for gait/stairs, narrow BOS.   Tall and half kneeling as able; weight shifting from pelvis    PT Home Exercise Plan  [email or web address]     Consulted and Agree with Plan of Care  Patient       Patient will benefit from skilled therapeutic intervention in order to improve the following deficits and impairments:  Abnormal gait, Decreased balance, Decreased coordination, Decreased strength, Difficulty walking, Increased muscle spasms, Impaired sensation, Pain, Impaired tone, Decreased range of motion  Visit Diagnosis: Muscle weakness (generalized)  Other abnormalities of gait and mobility  Unsteadiness  Other symptoms and signs involving the nervous system  Other disturbances of skin sensation  Paraplegia,  incomplete (HCC)     Problem List Patient Active Problem List   Diagnosis Date Noted  . Paraplegia (HCC) 11/07/2017  . Quadriplegia, unspecified (HCC) 11/07/2017  . Leukocytosis   . Neuropathic pain   . Drug induced constipation   . Urinary retention   . Class 1 obesity due to excess calories with serious comorbidity and body mass index (BMI) of 33.0 to 33.9 in adult   . Myelopathy (  [place]) 08/01/2017  . Thoracic spinal stenosis 07/31/2017    Floreen Comber, SPT 12/20/2017, 9:38 AM  [facility] [address] [place], [place], [address] Phone: [phone]   Fax:  [phone]  Name: ESIAH BAZINET MRN: [id number] Date of Birth: 07/04/1985

## 2017-12-23 ENCOUNTER — Encounter: Payer: Self-pay | Admitting: Physical Therapy

## 2017-12-23 ENCOUNTER — Ambulatory Visit: Payer: 59 | Admitting: Physical Therapy

## 2017-12-23 DIAGNOSIS — R208 Other disturbances of skin sensation: Secondary | ICD-10-CM | POA: Diagnosis not present

## 2017-12-23 DIAGNOSIS — R2681 Unsteadiness on feet: Secondary | ICD-10-CM | POA: Diagnosis not present

## 2017-12-23 DIAGNOSIS — R29818 Other symptoms and signs involving the nervous system: Secondary | ICD-10-CM | POA: Diagnosis not present

## 2017-12-23 DIAGNOSIS — M6281 Muscle weakness (generalized): Secondary | ICD-10-CM

## 2017-12-23 DIAGNOSIS — R2689 Other abnormalities of gait and mobility: Secondary | ICD-10-CM | POA: Diagnosis not present

## 2017-12-23 DIAGNOSIS — G8222 Paraplegia, incomplete: Secondary | ICD-10-CM | POA: Diagnosis not present

## 2017-12-23 DIAGNOSIS — M546 Pain in thoracic spine: Secondary | ICD-10-CM | POA: Diagnosis not present

## 2017-12-23 NOTE — Therapy (Signed)
[facility] [address] [place], [place], [address] Phone: [phone]   Fax:  [phone]  Physical Therapy Treatment  Patient Details  Name: Scott Lane MRN: [id number] Date of Birth: Aug 13, 1985 Referring Provider: Delice Lesch, MD   Encounter Date: 12/23/2017  PT End of Session - 12/23/17 0901    Visit Number  59    Number of Visits  57    Date for PT Re-Evaluation  12/26/17    Authorization Type  [place] [employer] Deductible.  VL: MN    PT Start Time  0800    PT Stop Time  0845    PT Time Calculation (min)  45 min    Activity Tolerance  Patient tolerated treatment well    Behavior During Therapy  WFL for tasks assessed/performed       Past Medical History:  Diagnosis Date  . Chicken pox     Past Surgical History:  Procedure Laterality Date  . LUMBAR LAMINECTOMY/DECOMPRESSION MICRODISCECTOMY N/A 08/01/2017   Procedure: Thoracic nine, ten, eleven, twelve laminectomy;  Surgeon: Eustace Moore, MD;  Location: [facility];  Service: Neurosurgery;  Laterality: N/A;  . none    . WISDOM TOOTH EXTRACTION     bilateral lower.    There were no vitals filed for this visit.  Subjective Assessment - 12/23/17 0807    Subjective  Pt reports he did not see his doctor regarding follow up with PT s/p his cervical surgery. Pt reports no pain prior to session.    Patient Stated Goals  To be more independent at home and improve LE strength    Currently in Pain?  No/denies       Skilled Physical Therapy Intervention:   Therapist instructed patient on modifications to his existing HEP to assist with further progressing him towards his therapy goals s/p upper thoracic surgery on 9/20. Pt verbalizes and demonstrates all of the exercises below:  Access Code: [email or web address]  URL: [email or web address]  Date: 12/23/2017    Exercises  Single Leg Sit to Stand with Arms Extended - 10 reps - 2 sets - 1x daily - 7x weekly   Sidelying Reverse Clamshell - 10 reps - 1 sets - 1x daily - 7x weekly  Diaphragmatic Breathing at 90/90 Supported - 1 sets - 3 minute hold - 1x daily - 7x weekly  Single Knee to Chest Stretch - 2 sets - 20 second hold - 1x daily - 7x weekly  Supine Double Knee to Chest - 2 sets - 20 second hold - 1x daily - 7x weekly  Standing Hip Flexor Stretch with Foot Elevated - 3 sets - 30 second hold - 1x daily - 7x weekly  Gastroc Stretch on Wall - 3 sets - 30 second hold - 1x daily - 5x weekly  Standing Hip Extension - 10 reps - 3 sets - 1x daily - 5x weekly  Supine Hamstring Stretch with Strap - 10 reps - 3 sets - 30 hold - 1x daily - 7x weekly  Mini Squat with Counter Support - 10 reps - 3 sets - 1x daily - 5x weekly  Standing Knee Flexion - 10 reps - 3 sets - 1x daily - 5x weekly  Supine Bilateral Hamstring Sets - 10 reps - 1 sets - 7 hold - 1x daily - 5x weekly      [facility] Adult PT Treatment/Exercise - 12/23/17 0906      Therapeutic Activites    Therapeutic Activities  Other Therapeutic Activities  Other Therapeutic Activities  Educated patient on how to perform log roll to protect upper thoracic spine s/p surgery on 9/20 with patient verbalizing and demonstrating understanding. Provided education regarding expectations following upper thoracic surgery and to avoid any unnecessary movements that may place stress on upper thoracic spine. Provided education on contributing factors that may be contributing to his lower back tightness that may be arising from his prior laminectomy 2/2 scar tissue formation.          PT Education - 12/23/17 0859    Education provided  Yes    Education Details  Follow up s/p surgery and POC, log roll to protect spine s/p surgery with patient demonstrating recall from prior lumbar surgery, modifications of HEP exercises, education on lower back tightness and contributing factors from prior surgery    Person(s) Educated  Patient    Methods  Explanation     Comprehension  Verbalized understanding       PT Short Term Goals - 11/21/17 0824      PT SHORT TERM GOAL #1   Title  Pt will improve standing balance as indicated by increased BERG balance score to >/= 44/56    Baseline  44/56    Time  5    Period  Weeks    Status  Achieved      PT SHORT TERM GOAL #2   Title  Pt will participate in initial fitting and training of AFO for indoor/outdoor surfaces with cane    Baseline  performed assessment with bracing but pt is not open to using right now    Time  5    Period  Weeks    Status  Achieved      PT SHORT TERM GOAL #3   Title  Pt will improve five time sit <> stand from arm chair to </= 15 seconds with supervision without use of UE to demonstrate increased functional LE strength    Baseline  --    Time  5    Period  Weeks    Status  Achieved      PT SHORT TERM GOAL #4   Title  Pt will improve gait velocity with RW to >/= 1.8 ft/sec with cane and AFO    Baseline  1.3 with cane, no AFO    Time  5    Period  Weeks    Status  Not Met      PT SHORT TERM GOAL #5   Title  Pt will demonstrate ability to ambulate safely carry baby carrier over indoor surfaces with cane and supervision and up/down stairs with one rail    Baseline  min A up/down stairs with one rail without carrying carrier    Time  5    Period  Weeks    Status  Not Met        PT Long Term Goals - 12/20/17 2778      PT LONG TERM GOAL #1   Title  Pt will be independent with balance and LE strengthening HEP    Status  Achieved      PT LONG TERM GOAL #2   Title  Pt will improve standing balance and BERG >/= 50/56 to decrease falls risk    Baseline  Pt scored 47/56 indicating moderate fall risk potential- improved but not to goal level    Status  Not Met      PT LONG TERM GOAL #3   Title  Pt will improve gait velocity  to >/= 2.5 ft/sec with LRAD     Baseline  1.426 ft/sec- improved but not to goal level    Status  Not Met      PT LONG TERM GOAL #4   Title  Pt  will ambulate x 1000 over outdoor surfaces + curb (paved and grassy) MOD I with LRAD; will negotiate 12 stairs, one rail + cane, alternating sequence MOD I    Baseline  Pt ambulates outside with SPC and Mod I on level surfaces with therapist providing supervision on uneven grass surface and curbs 2/2 to increased unsteadiness and weakness to L LE when descending curbs. Pt able to ambulate 500 feet, partially achieving this goal     Status  Partially Met      PT LONG TERM GOAL #5   Title  Pt will demonstrate ability to transition floor <> stand MOD I and perform infant care activities (carry, load carrier, push stroller, in the floor to play or change diaper, etc) with LRAD    Baseline  pt self reports that he is performing at home    Status  Achieved      PT LONG TERM GOAL #6   Title  Pt will report being able to stand on sidelines at football practice and game with AFO and LRAD/or no AD with intermittent seated rest breaks and move away from players quickly without LOB    Baseline  Deferred - 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 Pt able to demonstrate and verbalize understanding of all exercises and to work within his tolerance s/p surgery making sure to avoid any twisting and rotation movements that may increase pain and to follow any movement restrictions given to him by his surgeon. Pt demonstrates recall of log roll performance from prior lumbar surgery to avoid any unnecessary stress to cervical spine in preparation for surgery. Pt reports he will follow up with PT s/p surgery and verbalizes understanding of POC moving forward. Pt will continue to benefit from skilled PT to address functional deficits and progress  towards achieving therapy goals.     Rehab Potential  Good    Clinical Impairments Affecting Rehab Potential  will have to have upper thoracic laminectomy 9/20    PT Frequency  3x / week    PT Duration  Other (comment)    PT Next Visit Plan  review any questions prior to surgery 9/20    PT Home Exercise Plan  [email or web address]     Consulted and Agree with Plan of Care  Patient       Patient will benefit from skilled therapeutic intervention in order to improve the following deficits and impairments:  Abnormal gait, Decreased balance, Decreased coordination, Decreased strength, Difficulty walking, Increased muscle spasms, Impaired sensation, Pain, Impaired tone, Decreased range of motion  Visit Diagnosis: Muscle weakness (generalized)  Other abnormalities of gait and mobility  Unsteadiness  Other symptoms and signs involving the nervous system  Other disturbances of skin sensation  Paraplegia, incomplete ([place])     Problem List Patient Active Problem List   Diagnosis Date Noted  . Paraplegia ([place]) 11/07/2017  . Quadriplegia, unspecified ([place]) 11/07/2017  .  Leukocytosis   . Neuropathic pain   . Drug induced constipation   . Urinary retention   . Class 1 obesity due to excess calories with serious comorbidity and body mass index (BMI) of 33.0 to 33.9 in adult   . Myelopathy ([place]) 08/01/2017  . Thoracic spinal stenosis 07/31/2017    Floreen Comber, SPT 12/23/2017, 9:11 AM  [facility] [address] [place], [place], [address] Phone: [phone]   Fax:  [phone]  Name: Scott Lane MRN: [id number] Date of Birth: [birth year]

## 2017-12-23 NOTE — Patient Instructions (Signed)
Access Code: [email or web address]  URL: [email or web address]  Date: 12/23/2017   Exercises  Single Leg Sit to Stand with Arms Extended - 10 reps - 2 sets - 1x daily - 7x weekly  Sidelying Reverse Clamshell - 10 reps - 1 sets - 1x daily - 7x weekly  Diaphragmatic Breathing at 90/90 Supported - 1 sets - 3 minute hold - 1x daily - 7x weekly  Single Knee to Chest Stretch - 2 sets - 20 second hold - 1x daily - 7x weekly  Supine Double Knee to Chest - 2 sets - 20 second hold - 1x daily - 7x weekly  Standing Hip Flexor Stretch with Foot Elevated - 3 sets - 30 second hold - 1x daily - 7x weekly  Gastroc Stretch on Wall - 3 sets - 30 second hold - 1x daily - 5x weekly  Standing Hip Extension - 10 reps - 3 sets - 1x daily - 5x weekly  Supine Hamstring Stretch with Strap - 10 reps - 3 sets - 30 hold - 1x daily - 7x weekly  Mini Squat with Counter Support - 10 reps - 3 sets - 1x daily - 5x weekly  Standing Knee Flexion - 10 reps - 3 sets - 1x daily - 5x weekly  Supine Bilateral Hamstring Sets - 10 reps - 1 sets - 7 hold - 1x daily - 5x weekly

## 2017-12-25 ENCOUNTER — Ambulatory Visit: Payer: 59 | Admitting: Physical Therapy

## 2017-12-25 DIAGNOSIS — M546 Pain in thoracic spine: Secondary | ICD-10-CM | POA: Diagnosis not present

## 2017-12-25 DIAGNOSIS — R208 Other disturbances of skin sensation: Secondary | ICD-10-CM | POA: Diagnosis not present

## 2017-12-25 DIAGNOSIS — M6281 Muscle weakness (generalized): Secondary | ICD-10-CM | POA: Diagnosis not present

## 2017-12-25 DIAGNOSIS — R29818 Other symptoms and signs involving the nervous system: Secondary | ICD-10-CM | POA: Diagnosis not present

## 2017-12-25 DIAGNOSIS — G8222 Paraplegia, incomplete: Secondary | ICD-10-CM | POA: Diagnosis not present

## 2017-12-25 DIAGNOSIS — R2681 Unsteadiness on feet: Secondary | ICD-10-CM | POA: Diagnosis not present

## 2017-12-25 DIAGNOSIS — R2689 Other abnormalities of gait and mobility: Secondary | ICD-10-CM

## 2017-12-25 NOTE — Therapy (Signed)
[facility] [address] [place], [place], [address] Phone: [phone]   Fax:  [phone]  Physical Therapy Treatment  Patient Details  Name: Scott Lane MRN: [id number] Date of Birth: 1986/03/27 Referring Provider: Delice Lesch, MD   Encounter Date: 12/25/2017  PT End of Session - 12/25/17 2120    Visit Number  45    Number of Visits  47    Date for PT Re-Evaluation  12/26/17    Authorization Type  [employer] Deductible.  VL: MN    PT Start Time  1520    PT Stop Time  1605    PT Time Calculation (min)  45 min       Past Medical History:  Diagnosis Date  . Chicken pox     Past Surgical History:  Procedure Laterality Date  . LUMBAR LAMINECTOMY/DECOMPRESSION MICRODISCECTOMY N/A 08/01/2017   Procedure: Thoracic nine, ten, eleven, twelve laminectomy;  Surgeon: Eustace Moore, MD;  Location: [facility];  Service: Neurosurgery;  Laterality: N/A;  . none    . WISDOM TOOTH EXTRACTION     bilateral lower.    There were no vitals filed for this visit.  Subjective Assessment - 12/25/17 2118    Subjective  Pt states he has surgery on Friday, 9-20 but still plans on coming to PT that morning; states he looked over the water exercises given for HEP - has no questions about them at this time    Patient Stated Goals  To be more independent at home and improve LE strength    Currently in Pain?  No/denies       Aquatic Therapy:    Floreen Comber, SPT present during PT session  Pool temperature 87 degrees  Pt entered and exited pool by step negotiation with use of bil. Hand rails with ascension and with descension - modified independently   Pt performed stretches for bil. LE's - runner's stretch, hamstring, and  heel cord stretches - 60 sec hold eahc stretch -1 rep each on each leg - at beginning of session  Pt performed hip abduction 10 reps with UE support on pool wall for assistance with balance - RLE and LLE; 2nd  set of 10 reps on each leg; 2nd set performed with  Increased speed to use current of water for resistance  Pt performed hip extension RLE and LLE - 10 reps with minimal UE support; pt then performed hip flexion/extension with knee flexed to 90 degrees - each knee held in flexion and moved leg forward and back for hip strengthening and for improved SLS on opposite limb Pt performed forward amb. Approx. 30' on tip toes with knees slightly flexed; "monster walk" (LE's abducted with high marching) approx. 30' across pool using buoyancy of water for support   Pt performed jumping 10 reps without UE support; jumping jacks 10 reps without UE support;   Braiding approx. 30' x 1rep across pool - cue to perform quickly and perform slight upward movement with the crossover   Pt performed bil. Knee flexion - facing pool wall to prevent hip flexor compensation - 10 reps RLE and LLE; then 2nd set on each leg with increased speed  Pt performed Lt step up exercise for Lt quad strengthening - use of bil. UE support needed due to quad weakness Pt performed Lt step down exercise on pool step with bil. UE support for Lt quad eccentric strengthening and to facilitate anterior weight shift with forward progression over  LLE - 10 reps; this exercise much easier in today's session than in previous session on 12-18-17  Pt performed hip strengthening exercises in supine position - noodle under arms for support with PT supporting pt and providing lift under pelvis for more flotation; pt performed hip abduction/adduction 10 reps; then 10 reps with hip abdct/adduction with alternate scissoring of LE's for coordination;   Pt performed stretches again at end of session - Runner's stretch, heel cord stretch and hamstring stretch with contract/relax for hamstring stretch with support of his LE provided by Floreen Comber, SPT's  leg to maintain placement on pool wall - 30 sec hold 1 rep for runner's stretch and heel cord stretch;  contract/relax for bil. Hamstring stretching                                          PT Short Term Goals - 11/21/17 3335      PT SHORT TERM GOAL #1   Title  Pt will improve standing balance as indicated by increased BERG balance score to >/= 44/56    Baseline  44/56    Time  5    Period  Weeks    Status  Achieved      PT SHORT TERM GOAL #2   Title  Pt will participate in initial fitting and training of AFO for indoor/outdoor surfaces with cane    Baseline  performed assessment with bracing but pt is not open to using right now    Time  5    Period  Weeks    Status  Achieved      PT SHORT TERM GOAL #3   Title  Pt will improve five time sit <> stand from arm chair to </= 15 seconds with supervision without use of UE to demonstrate increased functional LE strength    Baseline  --    Time  5    Period  Weeks    Status  Achieved      PT SHORT TERM GOAL #4   Title  Pt will improve gait velocity with RW to >/= 1.8 ft/sec with cane and AFO    Baseline  1.3 with cane, no AFO    Time  5    Period  Weeks    Status  Not Met      PT SHORT TERM GOAL #5   Title  Pt will demonstrate ability to ambulate safely carry baby carrier over indoor surfaces with cane and supervision and up/down stairs with one rail    Baseline  min A up/down stairs with one rail without carrying carrier    Time  5    Period  Weeks    Status  Not Met        PT Long Term Goals - 12/20/17 4562      PT LONG TERM GOAL #1   Title  Pt will be independent with balance and LE strengthening HEP    Status  Achieved      PT LONG TERM GOAL #2   Title  Pt will improve standing balance and BERG >/= 50/56 to decrease falls risk    Baseline  Pt scored 47/56 indicating moderate fall risk potential- improved but not to goal level    Status  Not Met      PT LONG TERM GOAL #3   Title  Pt will improve gait velocity  to >/= 2.5 ft/sec with LRAD     Baseline  1.426 ft/sec- improved but  not to goal level    Status  Not Met      PT LONG TERM GOAL #4   Title  Pt will ambulate x 1000 over outdoor surfaces + curb (paved and grassy) MOD I with LRAD; will negotiate 12 stairs, one rail + cane, alternating sequence MOD I    Baseline  Pt ambulates outside with SPC and Mod I on level surfaces with therapist providing supervision on uneven grass surface and curbs 2/2 to increased unsteadiness and weakness to L LE when descending curbs. Pt able to ambulate 500 feet, partially achieving this goal     Status  Partially Met      PT LONG TERM GOAL #5   Title  Pt will demonstrate ability to transition floor <> stand MOD I and perform infant care activities (carry, load carrier, push stroller, in the floor to play or change diaper, etc) with LRAD    Baseline  pt self reports that he is performing at home    Status  Achieved      PT LONG TERM GOAL #6   Title  Pt will report being able to stand on sidelines at football practice and game with AFO and LRAD/or no AD with intermittent seated rest breaks and move away from players quickly without LOB    Baseline  Deferred - 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  Pt demonstrates decreased Lt concentric quad strength as he has difficulty ascending step with LLE and requires bil. UE support due to quad weakness.  Performance of LLE step down exercise significantly improved and much easier today compared to performance in last week's session (initial attempt of this exercise was in last week's session).                                                              Rehab Potential  Good    Clinical  Impairments Affecting Rehab Potential  will have to have upper thoracic laminectomy 9/20    PT Duration  Other (comment)    PT Treatment/Interventions  ADLs/Self Care Home Management;Cryotherapy;Electrical Stimulation;Moist Heat;DME Instruction;Gait Scientist, forensic;Therapeutic activities;Functional mobility training;Therapeutic exercise;Balance training;Neuromuscular re-education;Patient/family education;Orthotic Fit/Training;Passive range of motion;Taping;Aquatic Therapy    PT Next Visit Plan  review any questions prior to surgery 9/20    Consulted and Agree with Plan of Care  Patient       Patient will benefit from skilled therapeutic intervention in order to improve the following deficits and impairments:  Abnormal gait, Decreased balance, Decreased coordination, Decreased strength, Difficulty walking, Increased muscle spasms, Impaired sensation, Pain, Impaired tone, Decreased range of motion  Visit Diagnosis: Muscle weakness (generalized)  Other abnormalities of gait and mobility  Unsteadiness     Problem List Patient Active Problem List   Diagnosis  Date Noted  . Paraplegia ([place]) 11/07/2017  . Quadriplegia, unspecified ([place]) 11/07/2017  . Leukocytosis   . Neuropathic pain   . Drug induced constipation   . Urinary retention   . Class 1 obesity due to excess calories with serious comorbidity and body mass index (BMI) of 33.0 to 33.9 in adult   . Myelopathy ([place]) 08/01/2017  . Thoracic spinal stenosis 07/31/2017    Alda Lea, PT 12/25/2017, 9:26 PM  [facility] [address] [place], [place], [address] Phone: [phone]   Fax:  [phone]  Name: Scott Lane MRN: [id number] Date of Birth: Jun 05, 1985

## 2017-12-27 ENCOUNTER — Inpatient Hospital Stay (HOSPITAL_COMMUNITY)
Admission: RE | Admit: 2017-12-27 | Discharge: 2017-12-29 | DRG: 519 | Disposition: A | Discharge status: Home / Self Care | Payer: 59 | Attending: Neurological Surgery | Admitting: Neurological Surgery

## 2017-12-27 ENCOUNTER — Inpatient Hospital Stay (HOSPITAL_COMMUNITY): Payer: 59

## 2017-12-27 ENCOUNTER — Other Ambulatory Visit: Payer: Self-pay

## 2017-12-27 ENCOUNTER — Encounter: Payer: Self-pay | Admitting: Neurological Surgery

## 2017-12-27 ENCOUNTER — Telehealth: Payer: Self-pay | Admitting: Physical Therapy

## 2017-12-27 ENCOUNTER — Encounter: Payer: Self-pay | Admitting: Physical Therapy

## 2017-12-27 ENCOUNTER — Inpatient Hospital Stay (HOSPITAL_COMMUNITY)
Admission: RE | Disposition: A | Discharge status: Home / Self Care | Payer: Self-pay | Source: Home / Self Care | Attending: Neurological Surgery

## 2017-12-27 ENCOUNTER — Ambulatory Visit: Payer: 59 | Admitting: Physical Therapy

## 2017-12-27 ENCOUNTER — Inpatient Hospital Stay (HOSPITAL_COMMUNITY): Payer: 59 | Admitting: Anesthesiology

## 2017-12-27 ENCOUNTER — Encounter (HOSPITAL_COMMUNITY): Payer: Self-pay | Admitting: Neurological Surgery

## 2017-12-27 DIAGNOSIS — R29818 Other symptoms and signs involving the nervous system: Secondary | ICD-10-CM

## 2017-12-27 DIAGNOSIS — Z419 Encounter for procedure for purposes other than remedying health state, unspecified: Secondary | ICD-10-CM

## 2017-12-27 DIAGNOSIS — Z9889 Other specified postprocedural states: Secondary | ICD-10-CM

## 2017-12-27 DIAGNOSIS — M6281 Muscle weakness (generalized): Secondary | ICD-10-CM

## 2017-12-27 DIAGNOSIS — R2689 Other abnormalities of gait and mobility: Secondary | ICD-10-CM

## 2017-12-27 DIAGNOSIS — G959 Disease of spinal cord, unspecified: Secondary | ICD-10-CM | POA: Diagnosis not present

## 2017-12-27 DIAGNOSIS — Z981 Arthrodesis status: Secondary | ICD-10-CM | POA: Diagnosis not present

## 2017-12-27 DIAGNOSIS — M4804 Spinal stenosis, thoracic region: Principal | ICD-10-CM | POA: Diagnosis present

## 2017-12-27 DIAGNOSIS — Z8249 Family history of ischemic heart disease and other diseases of the circulatory system: Secondary | ICD-10-CM | POA: Diagnosis not present

## 2017-12-27 DIAGNOSIS — G8222 Paraplegia, incomplete: Secondary | ICD-10-CM | POA: Diagnosis not present

## 2017-12-27 DIAGNOSIS — R208 Other disturbances of skin sensation: Secondary | ICD-10-CM | POA: Diagnosis not present

## 2017-12-27 DIAGNOSIS — Z79899 Other long term (current) drug therapy: Secondary | ICD-10-CM

## 2017-12-27 DIAGNOSIS — R2681 Unsteadiness on feet: Secondary | ICD-10-CM

## 2017-12-27 DIAGNOSIS — G992 Myelopathy in diseases classified elsewhere: Secondary | ICD-10-CM | POA: Diagnosis not present

## 2017-12-27 DIAGNOSIS — M4714 Other spondylosis with myelopathy, thoracic region: Secondary | ICD-10-CM | POA: Diagnosis present

## 2017-12-27 DIAGNOSIS — M546 Pain in thoracic spine: Secondary | ICD-10-CM | POA: Diagnosis not present

## 2017-12-27 HISTORY — PX: THORACIC DISCECTOMY: SHX6113

## 2017-12-27 SURGERY — THORACIC DISCECTOMY
Anesthesia: General | Site: Back

## 2017-12-27 MED ORDER — CHLORHEXIDINE GLUCONATE CLOTH 2 % EX PADS: 6.0000 | MEDICATED_PAD | Freq: Once | CUTANEOUS | Status: DC

## 2017-12-27 MED ORDER — THROMBIN (RECOMBINANT) 20000 UNITS EX SOLR
CUTANEOUS | Status: AC
  Filled 2017-12-27: qty 20000

## 2017-12-27 MED ORDER — METHOCARBAMOL 500 MG PO TABS
500.0000 mg | ORAL_TABLET | Freq: Four times a day (QID) | ORAL | Status: DC | PRN
  Administered 2017-12-27 – 2017-12-28 (×2): 500 mg via ORAL
  Filled 2017-12-27 (×3): qty 1

## 2017-12-27 MED ORDER — LACTATED RINGERS IV SOLN
INTRAVENOUS | Status: DC
  Administered 2017-12-27: 13:00:00 via INTRAVENOUS

## 2017-12-27 MED ORDER — HYDROMORPHONE HCL 1 MG/ML IJ SOLN
INTRAMUSCULAR | Status: AC
  Filled 2017-12-27: qty 1

## 2017-12-27 MED ORDER — BUPIVACAINE HCL (PF) 0.25 % IJ SOLN
INTRAMUSCULAR | Status: DC | PRN
  Administered 2017-12-27: 9 mL

## 2017-12-27 MED ORDER — DEXAMETHASONE SODIUM PHOSPHATE 10 MG/ML IJ SOLN
INTRAMUSCULAR | Status: AC
  Filled 2017-12-27: qty 1

## 2017-12-27 MED ORDER — ACETAMINOPHEN 325 MG PO TABS: 650.0000 mg | ORAL_TABLET | ORAL | Status: DC | PRN

## 2017-12-27 MED ORDER — SODIUM CHLORIDE 0.9 % IV SOLN
INTRAVENOUS | Status: DC | PRN
  Administered 2017-12-27: 17:00:00

## 2017-12-27 MED ORDER — CELECOXIB 200 MG PO CAPS
200.0000 mg | ORAL_CAPSULE | Freq: Two times a day (BID) | ORAL | Status: DC
  Administered 2017-12-27 – 2017-12-29 (×4): 200 mg via ORAL
  Filled 2017-12-27 (×5): qty 1

## 2017-12-27 MED ORDER — BACITRACIN ZINC 500 UNIT/GM EX OINT
TOPICAL_OINTMENT | CUTANEOUS | Status: DC | PRN
  Administered 2017-12-27: 1 via TOPICAL

## 2017-12-27 MED ORDER — SODIUM CHLORIDE 0.9% FLUSH
3.0000 mL | Freq: Two times a day (BID) | INTRAVENOUS | Status: DC
  Administered 2017-12-28 (×2): 3 mL via INTRAVENOUS

## 2017-12-27 MED ORDER — LIDOCAINE 2% (20 MG/ML) 5 ML SYRINGE
INTRAMUSCULAR | Status: DC | PRN
  Administered 2017-12-27: 30 mg via INTRAVENOUS

## 2017-12-27 MED ORDER — SENNA 8.6 MG PO TABS
1.0000 | ORAL_TABLET | Freq: Two times a day (BID) | ORAL | Status: DC
  Administered 2017-12-27 – 2017-12-29 (×4): 8.6 mg via ORAL
  Filled 2017-12-27 (×4): qty 1

## 2017-12-27 MED ORDER — MENTHOL 3 MG MT LOZG
1.0000 | LOZENGE | OROMUCOSAL | Status: DC | PRN
  Filled 2017-12-27: qty 9

## 2017-12-27 MED ORDER — TAMSULOSIN HCL 0.4 MG PO CAPS
0.4000 mg | ORAL_CAPSULE | Freq: Every day | ORAL | Status: DC
  Administered 2017-12-27 – 2017-12-29 (×3): 0.4 mg via ORAL
  Filled 2017-12-27 (×4): qty 1

## 2017-12-27 MED ORDER — ROCURONIUM BROMIDE 50 MG/5ML IV SOSY
PREFILLED_SYRINGE | INTRAVENOUS | Status: DC | PRN
  Administered 2017-12-27: 50 mg via INTRAVENOUS
  Administered 2017-12-27: 20 mg via INTRAVENOUS
  Administered 2017-12-27: 30 mg via INTRAVENOUS

## 2017-12-27 MED ORDER — CEFAZOLIN SODIUM-DEXTROSE 2-4 GM/100ML-% IV SOLN
2.0000 g | INTRAVENOUS | Status: AC
  Administered 2017-12-27: 2 g via INTRAVENOUS

## 2017-12-27 MED ORDER — BACLOFEN 10 MG PO TABS
20.0000 mg | ORAL_TABLET | Freq: Three times a day (TID) | ORAL | Status: DC
  Administered 2017-12-27 – 2017-12-29 (×4): 20 mg via ORAL
  Filled 2017-12-27: qty 2
  Filled 2017-12-27: qty 1
  Filled 2017-12-27: qty 2
  Filled 2017-12-27: qty 1
  Filled 2017-12-27: qty 2

## 2017-12-27 MED ORDER — FENTANYL CITRATE (PF) 250 MCG/5ML IJ SOLN
INTRAMUSCULAR | Status: AC
  Filled 2017-12-27: qty 5

## 2017-12-27 MED ORDER — ONDANSETRON HCL 4 MG/2ML IJ SOLN
INTRAMUSCULAR | Status: AC
  Filled 2017-12-27: qty 2

## 2017-12-27 MED ORDER — THROMBIN 5000 UNITS EX SOLR
OROMUCOSAL | Status: DC | PRN
  Administered 2017-12-27: 17:00:00 via TOPICAL

## 2017-12-27 MED ORDER — 0.9 % SODIUM CHLORIDE (POUR BTL) OPTIME
TOPICAL | Status: DC | PRN
  Administered 2017-12-27: 1000 mL

## 2017-12-27 MED ORDER — HYDROMORPHONE HCL 1 MG/ML IJ SOLN
1.0000 mg | INTRAMUSCULAR | Status: DC | PRN
  Administered 2017-12-27 – 2017-12-28 (×3): 1 mg via INTRAVENOUS
  Filled 2017-12-27 (×3): qty 1

## 2017-12-27 MED ORDER — PHENOL 1.4 % MT LIQD: 1.0000 | OROMUCOSAL | Status: DC | PRN

## 2017-12-27 MED ORDER — MIDAZOLAM HCL 5 MG/5ML IJ SOLN
INTRAMUSCULAR | Status: DC | PRN
  Administered 2017-12-27: 2 mg via INTRAVENOUS

## 2017-12-27 MED ORDER — LACTATED RINGERS IV SOLN
INTRAVENOUS | Status: DC | PRN
  Administered 2017-12-27 (×2): via INTRAVENOUS

## 2017-12-27 MED ORDER — DEXAMETHASONE 4 MG PO TABS
4.0000 mg | ORAL_TABLET | Freq: Four times a day (QID) | ORAL | Status: DC
  Administered 2017-12-28 – 2017-12-29 (×4): 4 mg via ORAL
  Filled 2017-12-27 (×5): qty 1

## 2017-12-27 MED ORDER — ACETAMINOPHEN 10 MG/ML IV SOLN
INTRAVENOUS | Status: AC
  Filled 2017-12-27: qty 100

## 2017-12-27 MED ORDER — HYDROMORPHONE HCL 1 MG/ML IJ SOLN
0.2500 mg | INTRAMUSCULAR | Status: DC | PRN
  Administered 2017-12-27 (×4): 0.5 mg via INTRAVENOUS

## 2017-12-27 MED ORDER — HYDROCODONE-ACETAMINOPHEN 7.5-325 MG PO TABS
1.0000 | ORAL_TABLET | ORAL | Status: DC | PRN
  Administered 2017-12-27 – 2017-12-29 (×9): 1 via ORAL
  Filled 2017-12-27 (×9): qty 1

## 2017-12-27 MED ORDER — BUPIVACAINE HCL (PF) 0.25 % IJ SOLN
INTRAMUSCULAR | Status: AC
  Filled 2017-12-27: qty 30

## 2017-12-27 MED ORDER — ONDANSETRON HCL 4 MG PO TABS: 4.0000 mg | ORAL_TABLET | Freq: Four times a day (QID) | ORAL | Status: DC | PRN

## 2017-12-27 MED ORDER — HYDROCODONE-ACETAMINOPHEN 7.5-325 MG PO TABS: 1.0000 | ORAL_TABLET | Freq: Four times a day (QID) | ORAL | Status: DC

## 2017-12-27 MED ORDER — DEXAMETHASONE SODIUM PHOSPHATE 4 MG/ML IJ SOLN
4.0000 mg | Freq: Four times a day (QID) | INTRAMUSCULAR | Status: DC
  Administered 2017-12-27 – 2017-12-28 (×3): 4 mg via INTRAVENOUS
  Filled 2017-12-27 (×3): qty 1

## 2017-12-27 MED ORDER — THROMBIN 5000 UNITS EX SOLR
CUTANEOUS | Status: AC
  Filled 2017-12-27: qty 5000

## 2017-12-27 MED ORDER — PROPOFOL 10 MG/ML IV BOLUS
INTRAVENOUS | Status: AC
  Filled 2017-12-27: qty 20

## 2017-12-27 MED ORDER — THROMBIN 5000 UNITS EX SOLR
CUTANEOUS | Status: AC
  Filled 2017-12-27: qty 10000

## 2017-12-27 MED ORDER — MIDAZOLAM HCL 2 MG/2ML IJ SOLN
INTRAMUSCULAR | Status: AC
  Filled 2017-12-27: qty 2

## 2017-12-27 MED ORDER — BACITRACIN ZINC 500 UNIT/GM EX OINT
TOPICAL_OINTMENT | CUTANEOUS | Status: AC
  Filled 2017-12-27: qty 28.35

## 2017-12-27 MED ORDER — CEFAZOLIN SODIUM-DEXTROSE 2-4 GM/100ML-% IV SOLN
INTRAVENOUS | Status: AC
  Filled 2017-12-27: qty 100

## 2017-12-27 MED ORDER — SODIUM CHLORIDE 0.9% FLUSH: 3.0000 mL | INTRAVENOUS | Status: DC | PRN

## 2017-12-27 MED ORDER — FENTANYL CITRATE (PF) 100 MCG/2ML IJ SOLN
INTRAMUSCULAR | Status: DC | PRN
  Administered 2017-12-27: 150 ug via INTRAVENOUS

## 2017-12-27 MED ORDER — ROCURONIUM BROMIDE 50 MG/5ML IV SOSY
PREFILLED_SYRINGE | INTRAVENOUS | Status: AC
  Filled 2017-12-27: qty 10

## 2017-12-27 MED ORDER — ACETAMINOPHEN 650 MG RE SUPP: 650.0000 mg | RECTAL | Status: DC | PRN

## 2017-12-27 MED ORDER — POTASSIUM CHLORIDE IN NACL 20-0.9 MEQ/L-% IV SOLN
INTRAVENOUS | Status: DC
  Administered 2017-12-27: 22:00:00 via INTRAVENOUS

## 2017-12-27 MED ORDER — METHOCARBAMOL 1000 MG/10ML IJ SOLN
500.0000 mg | Freq: Four times a day (QID) | INTRAVENOUS | Status: DC | PRN
  Filled 2017-12-27 (×2): qty 5

## 2017-12-27 MED ORDER — CEFAZOLIN SODIUM-DEXTROSE 2-4 GM/100ML-% IV SOLN
2.0000 g | Freq: Three times a day (TID) | INTRAVENOUS | Status: AC
  Administered 2017-12-27 – 2017-12-28 (×2): 2 g via INTRAVENOUS
  Filled 2017-12-27 (×4): qty 100

## 2017-12-27 MED ORDER — SUGAMMADEX SODIUM 200 MG/2ML IV SOLN
INTRAVENOUS | Status: DC | PRN
  Administered 2017-12-27: 300 mg via INTRAVENOUS

## 2017-12-27 MED ORDER — HYDROMORPHONE HCL 1 MG/ML IJ SOLN
INTRAMUSCULAR | Status: AC
  Administered 2017-12-27: 20:00:00
  Filled 2017-12-27: qty 1

## 2017-12-27 MED ORDER — DEXAMETHASONE SODIUM PHOSPHATE 10 MG/ML IJ SOLN
10.0000 mg | INTRAMUSCULAR | Status: AC
  Administered 2017-12-27: 10 mg via INTRAVENOUS

## 2017-12-27 MED ORDER — LIDOCAINE 2% (20 MG/ML) 5 ML SYRINGE
INTRAMUSCULAR | Status: AC
  Filled 2017-12-27: qty 10

## 2017-12-27 MED ORDER — ONDANSETRON HCL 4 MG/2ML IJ SOLN
INTRAMUSCULAR | Status: DC | PRN
  Administered 2017-12-27: 4 mg via INTRAVENOUS

## 2017-12-27 MED ORDER — THROMBIN 20000 UNITS EX SOLR
CUTANEOUS | Status: DC | PRN
  Administered 2017-12-27: 18:00:00 via TOPICAL

## 2017-12-27 MED ORDER — PROPOFOL 10 MG/ML IV BOLUS
INTRAVENOUS | Status: DC | PRN
  Administered 2017-12-27: 200 mg via INTRAVENOUS

## 2017-12-27 MED ORDER — SODIUM CHLORIDE 0.9 % IV SOLN: 250.0000 mL | INTRAVENOUS | Status: DC

## 2017-12-27 MED ORDER — ONDANSETRON HCL 4 MG/2ML IJ SOLN
4.0000 mg | Freq: Four times a day (QID) | INTRAMUSCULAR | Status: DC | PRN
  Administered 2017-12-27 – 2017-12-28 (×3): 4 mg via INTRAVENOUS
  Filled 2017-12-27 (×3): qty 2

## 2017-12-27 MED ORDER — ACETAMINOPHEN 10 MG/ML IV SOLN
INTRAVENOUS | Status: DC | PRN
  Administered 2017-12-27: 1000 mg via INTRAVENOUS

## 2017-12-27 SURGICAL SUPPLY — 58 items
ADH SKN CLS APL DERMABOND .7 (GAUZE/BANDAGES/DRESSINGS) ×1
APL SKNCLS STERI-STRIP NONHPOA (GAUZE/BANDAGES/DRESSINGS) ×1
BAG DECANTER FOR FLEXI CONT (MISCELLANEOUS) ×2 IMPLANT
BENZOIN TINCTURE PRP APPL 2/3 (GAUZE/BANDAGES/DRESSINGS) ×2 IMPLANT
BUR MATCHSTICK NEURO 3.0 LAGG (BURR) ×2 IMPLANT
CANISTER SUCT 3000ML PPV (MISCELLANEOUS) ×2 IMPLANT
CARTRIDGE OIL MAESTRO DRILL (MISCELLANEOUS) ×1 IMPLANT
DERMABOND ADVANCED (GAUZE/BANDAGES/DRESSINGS) ×1
DERMABOND ADVANCED .7 DNX12 (GAUZE/BANDAGES/DRESSINGS) IMPLANT
DIFFUSER DRILL AIR PNEUMATIC (MISCELLANEOUS) ×2 IMPLANT
DRAPE LAPAROTOMY 100X72X124 (DRAPES) ×2 IMPLANT
DRAPE MICROSCOPE LEICA (MISCELLANEOUS) ×2 IMPLANT
DRAPE POUCH INSTRU U-SHP 10X18 (DRAPES) ×2 IMPLANT
DRAPE SURG 17X23 STRL (DRAPES) ×2 IMPLANT
DRSG OPSITE POSTOP 4X8 (GAUZE/BANDAGES/DRESSINGS) ×1 IMPLANT
DURAPREP 26ML APPLICATOR (WOUND CARE) ×2 IMPLANT
ELECT BLADE 4.0 EZ CLEAN MEGAD (MISCELLANEOUS) ×2
ELECT REM PT RETURN 9FT ADLT (ELECTROSURGICAL) ×2
ELECTRODE BLDE 4.0 EZ CLN MEGD (MISCELLANEOUS) IMPLANT
ELECTRODE REM PT RTRN 9FT ADLT (ELECTROSURGICAL) ×1 IMPLANT
EVACUATOR 1/8 PVC DRAIN (DRAIN) ×1 IMPLANT
GAUZE 4X4 16PLY RFD (DISPOSABLE) IMPLANT
GLOVE BIO SURGEON STRL SZ7 (GLOVE) IMPLANT
GLOVE BIO SURGEON STRL SZ8 (GLOVE) ×2 IMPLANT
GLOVE BIOGEL PI IND STRL 6.5 (GLOVE) IMPLANT
GLOVE BIOGEL PI IND STRL 7.0 (GLOVE) IMPLANT
GLOVE BIOGEL PI IND STRL 8 (GLOVE) IMPLANT
GLOVE BIOGEL PI INDICATOR 6.5 (GLOVE) ×1
GLOVE BIOGEL PI INDICATOR 7.0 (GLOVE)
GLOVE BIOGEL PI INDICATOR 8 (GLOVE) ×1
GLOVE SURG SS PI 6.0 STRL IVOR (GLOVE) ×3 IMPLANT
GOWN STRL REUS W/ TWL LRG LVL3 (GOWN DISPOSABLE) IMPLANT
GOWN STRL REUS W/ TWL XL LVL3 (GOWN DISPOSABLE) ×1 IMPLANT
GOWN STRL REUS W/TWL 2XL LVL3 (GOWN DISPOSABLE) IMPLANT
GOWN STRL REUS W/TWL LRG LVL3 (GOWN DISPOSABLE) ×2
GOWN STRL REUS W/TWL XL LVL3 (GOWN DISPOSABLE) ×2
HEMOSTAT POWDER KIT SURGIFOAM (HEMOSTASIS) ×1 IMPLANT
KIT BASIN OR (CUSTOM PROCEDURE TRAY) ×2 IMPLANT
KIT TURNOVER KIT B (KITS) ×2 IMPLANT
NDL HYPO 25X1 1.5 SAFETY (NEEDLE) ×1 IMPLANT
NDL SPNL 20GX3.5 QUINCKE YW (NEEDLE) IMPLANT
NEEDLE HYPO 25X1 1.5 SAFETY (NEEDLE) ×2 IMPLANT
NEEDLE SPNL 20GX3.5 QUINCKE YW (NEEDLE) IMPLANT
NS IRRIG 1000ML POUR BTL (IV SOLUTION) ×2 IMPLANT
OIL CARTRIDGE MAESTRO DRILL (MISCELLANEOUS) ×2
PACK LAMINECTOMY NEURO (CUSTOM PROCEDURE TRAY) ×2 IMPLANT
PAD ARMBOARD 7.5X6 YLW CONV (MISCELLANEOUS) ×6 IMPLANT
RUBBERBAND STERILE (MISCELLANEOUS) ×4 IMPLANT
SPONGE SURGIFOAM ABS GEL 100 (HEMOSTASIS) ×1 IMPLANT
SPONGE SURGIFOAM ABS GEL SZ50 (HEMOSTASIS) ×2 IMPLANT
STRIP CLOSURE SKIN 1/2X4 (GAUZE/BANDAGES/DRESSINGS) ×2 IMPLANT
SUT VIC AB 0 CT1 18XCR BRD8 (SUTURE) ×1 IMPLANT
SUT VIC AB 0 CT1 8-18 (SUTURE) ×4
SUT VIC AB 2-0 CP2 18 (SUTURE) ×2 IMPLANT
SUT VIC AB 3-0 SH 8-18 (SUTURE) ×3 IMPLANT
TOWEL GREEN STERILE (TOWEL DISPOSABLE) ×2 IMPLANT
TOWEL GREEN STERILE FF (TOWEL DISPOSABLE) ×2 IMPLANT
WATER STERILE IRR 1000ML POUR (IV SOLUTION) ×2 IMPLANT

## 2017-12-27 NOTE — Anesthesia Preprocedure Evaluation (Addendum)
Anesthesia Evaluation  Patient identified by MRN, date of birth, ID band Patient awake    Reviewed: Allergy & Precautions, H&P , NPO status , Patient's Chart, lab work & pertinent test results  Airway Mallampati: II  TM Distance: >3 FB Neck ROM: Full    Dental no notable dental hx. (+) Teeth Intact, Dental Advisory Given   Pulmonary neg pulmonary ROS,    Pulmonary exam normal breath sounds clear to auscultation       Cardiovascular negative cardio ROS   Rhythm:Regular Rate:Normal     Neuro/Psych negative neurological ROS  negative psych ROS   GI/Hepatic negative GI ROS, Neg liver ROS,   Endo/Other  negative endocrine ROS  Renal/GU negative Renal ROS  negative genitourinary   Musculoskeletal   Abdominal   Peds  Hematology negative hematology ROS (+)   Anesthesia Other Findings   Reproductive/Obstetrics negative OB ROS                            Anesthesia Physical Anesthesia Plan  ASA: II  Anesthesia Plan: General   Post-op Pain Management:    Induction: Intravenous  PONV Risk Score and Plan: 3 and Ondansetron, Dexamethasone and Midazolam  Airway Management Planned: Oral ETT  Additional Equipment:   Intra-op Plan:   Post-operative Plan: Extubation in OR  Informed Consent: I have reviewed the patients History and Physical, chart, labs and discussed the procedure including the risks, benefits and alternatives for the proposed anesthesia with the patient or authorized representative who has indicated his/her understanding and acceptance.     Dental advisory given  Plan Discussed with: CRNA  Anesthesia Plan Comments:         Anesthesia Quick Evaluation  

## 2017-12-27 NOTE — Transfer of Care (Signed)
Immediate Anesthesia Transfer of Care Note  Patient: Scott Lane M Dorn  Procedure(s) Performed: Laminectomy and Foraminotomy - Thoracic one - Thoracic three (N/A Back)  Patient Location: PACU  Anesthesia Type:General  Level of Consciousness: awake and alert   Airway & Oxygen Therapy: Patient Spontanous Breathing  Post-op Assessment: Report given to RN and Post -op Vital signs reviewed and stable  Post vital signs: Reviewed and stable  Last Vitals:  Vitals Value Taken Time  BP 183/88 12/27/2017  7:21 PM  Temp    Pulse 102 12/27/2017  7:22 PM  Resp 14 12/27/2017  7:22 PM  SpO2 97 % 12/27/2017  7:22 PM  Vitals shown include unvalidated device data.  Last Pain:  Vitals:   12/27/17 1305  TempSrc:   PainSc: 0-No pain      Patients Stated Pain Goal: 0 (12/27/17 1305)  Complications: No apparent anesthesia complications

## 2017-12-27 NOTE — H&P (Signed)
Subjective:   Patient is a 32 y.o. male admitted for TL for stenosis. The patient first presented to me with complaints of weakness in his legs he underwent a thoracic laminectomy T10-T12.  He had known spinal stenosis T1-T3 and he comes in today for planned staged decompressive thoracic laminectomy. onset of symptoms was several weeks ago. The pain is described as aching and occurs intermittently. The pain is rated mild, and is located in the back. The symptoms have been progressive. Symptoms are exacerbated by none, and are relieved by none.  Previous work up includes MRI which shows significant spinal stenosis T1-T3.  Past Medical History:  Diagnosis Date  . Chicken pox     Past Surgical History:  Procedure Laterality Date  . LUMBAR LAMINECTOMY/DECOMPRESSION MICRODISCECTOMY N/A 08/01/2017   Procedure: Thoracic nine, ten, eleven, twelve laminectomy;  Surgeon: Tia Alert, MD;  Location: [facility];  Service: Neurosurgery;  Laterality: N/A;  . none    . WISDOM TOOTH EXTRACTION     bilateral lower.    No Known Allergies  Social History   Tobacco Use  . Smoking status: Never Smoker  . Smokeless tobacco: Never Used  Substance Use Topics  . Alcohol use: Yes    Comment: occasional on weekend    Family History  Problem Relation Age of Onset  . Hypertension Mother   . Diabetes Mother   . Hypertension Father   . Lung cancer Father        smoker  . Hypertension Brother   . Hypertension Maternal Grandmother   . Lung cancer Maternal Grandmother        non smoker  . Hypertension Paternal Grandmother    Prior to Admission medications   Medication Sig Start Date End Date Taking? Authorizing Provider  B Complex Vitamins (B COMPLEX PO) Take 1 tablet by mouth daily.   Yes [provider]  baclofen (LIORESAL) 20 MG tablet Take 20 mg by mouth 3 (three) times daily.    Yes [provider]  lidocaine (LIDODERM) 5 % Place 1 patch onto the skin daily as needed (pain). Remove &  Discard patch within 12 hours or as directed by MD   Yes [provider]  Multiple Vitamins-Minerals (ONE DAILY MULTIVITAMIN MEN PO) Take 1 tablet by mouth daily.   Yes [provider]  tiZANidine (ZANAFLEX) 4 MG tablet Take 1 tablet (4 mg total) by mouth 3 (three) times daily. Patient not taking: Reported on 12/18/2017 11/07/17   Levert Feinstein, MD     Review of Systems  Positive ROS: neg  All other systems have been reviewed and were otherwise negative with the exception of those mentioned in the HPI and as above.  Objective: Vital signs in last 24 hours:    General Appearance: Alert, cooperative, no distress, appears stated age Head: Normocephalic, without obvious abnormality, atraumatic Eyes: PERRL, conjunctiva/corneas clear, EOM's intact      Neck: Supple, symmetrical, trachea midline, Back: Symmetric, no curvature, ROM normal, no CVA tenderness Lungs:  respirations unlabored Heart: Regular rate and rhythm Abdomen: Soft, non-tender Extremities: Extremities normal, atraumatic, no cyanosis or edema Pulses: 2+ and symmetric all extremities Skin: Skin color, texture, turgor normal, no rashes or lesions  NEUROLOGIC:  Mental status: Alert and oriented x4, no aphasia, good attention span, fund of knowledge and memory  Motor Exam - grossly normal Sensory Exam - grossly normal Reflexes: 3+ Coordination - grossly normal Gait -spastic Balance - grossly normal Cranial Nerves: I: smell Not tested  II:  visual acuity  OS: nl    OD: nl  II: visual fields Full to confrontation  II: pupils Equal, round, reactive to light  III,VII: ptosis None  III,IV,VI: extraocular muscles  Full ROM  V: mastication Normal  V: facial light touch sensation  Normal  V,VII: corneal reflex  Present  VII: facial muscle function - upper  Normal  VII: facial muscle function - lower Normal  VIII: hearing Not tested  IX: soft palate elevation  Normal  IX,X: gag reflex Present  XI: trapezius  strength  5/5  XI: sternocleidomastoid strength 5/5  XI: neck flexion strength  5/5  XII: tongue strength  Normal    Data Review Lab Results  Component Value Date   WBC 4.0 12/20/2017   HGB 15.9 12/20/2017   HCT 48.0 12/20/2017   MCV 85.9 12/20/2017   PLT 255 12/20/2017   Lab Results  Component Value Date   NA 140 12/20/2017   K 3.9 12/20/2017   CL 104 12/20/2017   CO2 27 12/20/2017   BUN 11 12/20/2017   CREATININE 1.15 12/20/2017   GLUCOSE 95 12/20/2017   Lab Results  Component Value Date   INR 0.97 12/20/2017    Assessment:   Cervical neck pain with herniated nucleus pulposus/ spondylosis/ stenosis at T1-T3. Estimated body mass index is 33.91 kg/m as calculated from the following:   Height as of 12/20/17: 6' (1.829 m).   Weight as of 12/20/17: 113.4 kg.  Patient has failed conservative therapy. Planned surgery : Decompressive thoracic laminectomy T1-T3  Plan:   I explained the condition and procedure to the patient and answered any questions.  Patient wishes to proceed with procedure as planned. Understands risks/ benefits/ and expected or typical outcomes.  Teran Knittle S 12/27/2017 12:45 PM

## 2017-12-27 NOTE — Therapy (Signed)
[facility] [address] [place], [place], [address] Phone: [phone]   Fax:  [phone]  Physical Therapy Treatment & Discharge Summary   Patient Details  Name: Scott Lane MRN: [id number] Date of Birth: January 24, 1986 Referring Provider: Delice Lesch, MD   Encounter Date: 12/27/2017  PT End of Session - 12/27/17 1202    Visit Number  84    Number of Visits  29    Date for PT Re-Evaluation  12/26/17    Authorization Type  [employer] Deductible.  VL: MN    PT Start Time  0800    PT Stop Time  0845    PT Time Calculation (min)  45 min    Activity Tolerance  Patient tolerated treatment well    Behavior During Therapy  WFL for tasks assessed/performed       Past Medical History:  Diagnosis Date  . Chicken pox     Past Surgical History:  Procedure Laterality Date  . LUMBAR LAMINECTOMY/DECOMPRESSION MICRODISCECTOMY N/A 08/01/2017   Procedure: Thoracic nine, ten, eleven, twelve laminectomy;  Surgeon: Eustace Moore, MD;  Location: [facility];  Service: Neurosurgery;  Laterality: N/A;  . none    . WISDOM TOOTH EXTRACTION     bilateral lower.    There were no vitals filed for this visit.  Subjective Assessment - 12/27/17 0809    Subjective  Pt reports no new complaints and reduced stiffness in L knee. Pt reports he felt really good after the pool workout on Wednesday.     Patient Stated Goals  To be more independent at home and improve LE strength    Currently in Pain?  No/denies       [facility] Adult PT Treatment/Exercise - 12/27/17 0814      Transfers   Transfers  Sit to Stand;Stand to Sit;Stand Pivot Transfers    Sit to Stand  6: Modified independent (Device/Increase time);With upper extremity assist    Stand to Sit  6: Modified independent (Device/Increase time);With upper extremity assist    Stand Pivot Transfers  6: Modified independent (Device/Increase time)      Dynamic Standing Balance   Dynamic  Standing - Balance Support  Right upper extremity supported;Left upper extremity supported   decreasing UE support to singe UE using finger tips   Dynamic Standing - Level of Assistance  5: Stand by assistance    Dynamic Standing - Balance Activities  Rocker board    Rocker board comments:  Progressed patient to alternating forward cone tapping for x10 reps/ each LE to increase lateral weight shifts and SLS duration for strengthening during dynamic balance. Further progressed to LE cone tapping crossing midline for 1x10 reps each LE for increased challenge. Pt able to decrease UE as tolerated. Pt challenged on circular rocker board that moves in all directions for 1x15 seconds using finger tip UE support in // bars.        Exercises   Exercises  Knee/Hip;Lumbar      Knee/Hip Exercises: Stretches   Active Hamstring Stretch  Both;30 seconds;2 reps   LE on stool using gait belt to limit excessive trunk flexion   Other Knee/Hip Stretches  Pt performs alternating glute stretches in supine with single leg knee to chest for 3x30 seconds to increase LE flexibility       Knee/Hip Exercises: Machines for Strengthening   Other Machine  Pt performs LE strengthening on leg press machine for 3x10 reps using 80# for resistance. Pt  scores RPE on Modified Borg scale as 10/20. Pt attempts to perform L LE single leg press using 50# but demonstrates observable weakness with inability to tolerate for >3 reps.       Knee/Hip Exercises: Supine   Bridges  Strengthening;Both;2 sets;10 reps   with LE on bosu ball to increase challenge    Bridges Limitations  Pt demonstrates increased hip musculature weakness demonstrated by inability to stabilize during pelvic rises          PT Education - 12/27/17 1201    Education provided  Yes    Education Details  POC moving forward s/p surgery this afternoon & continuation of revised HEP at home to further progress with strengthening and flexibility    Person(s) Educated   Patient    Methods  Explanation    Comprehension  Verbalized understanding       PT Short Term Goals - 11/21/17 0824      PT SHORT TERM GOAL #1   Title  Pt will improve standing balance as indicated by increased BERG balance score to >/= 44/56    Baseline  44/56    Time  5    Period  Weeks    Status  Achieved      PT SHORT TERM GOAL #2   Title  Pt will participate in initial fitting and training of AFO for indoor/outdoor surfaces with cane    Baseline  performed assessment with bracing but pt is not open to using right now    Time  5    Period  Weeks    Status  Achieved      PT SHORT TERM GOAL #3   Title  Pt will improve five time sit <> stand from arm chair to </= 15 seconds with supervision without use of UE to demonstrate increased functional LE strength    Baseline  --    Time  5    Period  Weeks    Status  Achieved      PT SHORT TERM GOAL #4   Title  Pt will improve gait velocity with RW to >/= 1.8 ft/sec with cane and AFO    Baseline  1.3 with cane, no AFO    Time  5    Period  Weeks    Status  Not Met      PT SHORT TERM GOAL #5   Title  Pt will demonstrate ability to ambulate safely carry baby carrier over indoor surfaces with cane and supervision and up/down stairs with one rail    Baseline  min A up/down stairs with one rail without carrying carrier    Time  5    Period  Weeks    Status  Not Met        PT Long Term Goals - 12/20/17 9628      PT LONG TERM GOAL #1   Title  Pt will be independent with balance and LE strengthening HEP    Status  Achieved      PT LONG TERM GOAL #2   Title  Pt will improve standing balance and BERG >/= 50/56 to decrease falls risk    Baseline  Pt scored 47/56 indicating moderate fall risk potential- improved but not to goal level    Status  Not Met      PT LONG TERM GOAL #3   Title  Pt will improve gait velocity to >/= 2.5 ft/sec with LRAD     Baseline  1.426 ft/sec-  improved but not to goal level    Status  Not Met       PT LONG TERM GOAL #4   Title  Pt will ambulate x 1000 over outdoor surfaces + curb (paved and grassy) MOD I with LRAD; will negotiate 12 stairs, one rail + cane, alternating sequence MOD I    Baseline  Pt ambulates outside with SPC and Mod I on level surfaces with therapist providing supervision on uneven grass surface and curbs 2/2 to increased unsteadiness and weakness to L LE when descending curbs. Pt able to ambulate 500 feet, partially achieving this goal     Status  Partially Met      PT LONG TERM GOAL #5   Title  Pt will demonstrate ability to transition floor <> stand MOD I and perform infant care activities (carry, load carrier, push stroller, in the floor to play or change diaper, etc) with LRAD    Baseline  pt self reports that he is performing at home    Status  Achieved      PT LONG TERM GOAL #6   Title  Pt will report being able to stand on sidelines at football practice and game with AFO and LRAD/or no AD with intermittent seated rest breaks and move away from players quickly without LOB    Baseline  Deferred - 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 Pt continues to demonstrate hip musculature and knee weakness more prominent on L LE > R LE during strengthening exercises assessed today. Pt demonstrates improved L LE weight bearing with increased confidence when performing alternating cone tapping on rocker board with decreased UE assist while maintaining bilateral slight knee flexion to prevent locking out his knees. Overall, patient has achieved 2/5 LTG's and will continue to benefit from continuation of physical therapy s/p cervical spinal surgery to further progress towards functional independence. Pt's gait speed using SPC is 1.42  ft/sec indicating limited community ambulator with recurrent fall risk potential. Pt did not achieve his LTG of improving his gait speed to >2.5 ft/sec using LRAD. Pt's berg score of 47/56 indicates he is at moderate fall risk potential and did not achieve LTG of >/=50/56 to decrease fall risk. Lastly, Pt will benefit from continuation of PT to improve functional independence with community ambulation with special consideration to uneven surfaces and stairs.     Rehab Potential  Good    Clinical Impairments Affecting Rehab Potential  will have to have upper thoracic laminectomy 9/20    PT Duration  Other (comment)    PT Treatment/Interventions  ADLs/Self Care Home Management;Cryotherapy;Electrical Stimulation;Moist Heat;DME Instruction;Gait Scientist, forensic;Therapeutic activities;Functional mobility training;Therapeutic exercise;Balance training;Neuromuscular re-education;Patient/family education;Orthotic Fit/Training;Passive range of motion;Taping;Aquatic Therapy    PT Next Visit Plan  discharged from PT on 9/20    PT [place]     Consulted and Agree with Plan of Care  Patient       Patient will benefit from skilled therapeutic intervention in order to improve the following deficits and impairments:  Abnormal gait, Decreased balance, Decreased coordination, Decreased strength, Difficulty walking, Increased muscle spasms, Impaired sensation, Pain, Impaired tone, Decreased range of motion  Visit Diagnosis: Muscle weakness (generalized)  Other abnormalities  of gait and mobility  Unsteadiness  Other symptoms and signs involving the nervous system  Problem List Patient Active Problem List   Diagnosis Date Noted  . Paraplegia ([facility]) 11/07/2017  . Quadriplegia, unspecified ([place]) 11/07/2017  . Leukocytosis   . Neuropathic pain   . Drug induced constipation   . Urinary retention   . Class 1 obesity due to excess calories with serious comorbidity and body mass index  (BMI) of 33.0 to 33.9 in adult   . Myelopathy ([place]) 08/01/2017  . Thoracic spinal stenosis 07/31/2017     PHYSICAL THERAPY DISCHARGE SUMMARY  Visits from Start of Care: 46/47 visits   Current functional level related to goals / functional outcomes: Refer to impression statement regarding progress with LTG's and current functional status.    Remaining deficits: Pt continues to demonstrate decreased LE strength on L>R LLE. Pt demonstrates improvements with L LE weight shifting with reduced knee buckling episodes. Pt continues to demonstrate difficulty with eccentric quads during stair and curb negotiation and dynamic standing balance deficits which may increase his fall risk potential.   Education / Equipment: Therapist provided HEP education for revised exercises to perform s/p spinal surgery.  Plan: Patient agrees to discharge.  Patient goals were partially met. Patient is being discharged due to a change in medical status.  ?????         Floreen Comber, SPT 12/27/2017, 12:14 PM  [facility] [address] [place], [place], [address] Phone: [phone]   Fax:  [phone]  Name: Scott Lane MRN: [id number] Date of Birth: 1986-02-09

## 2017-12-27 NOTE — Op Note (Signed)
12/27/2017  6:53 PM  PATIENT:  Orene DesanctisLee M Sonier  32 y.o. male  PRE-OPERATIVE DIAGNOSIS: Thoracic spinal stenosis T1-T3 with myelopathy  POST-OPERATIVE DIAGNOSIS:  same  PROCEDURE: Decompressive thoracic laminectomy, medial facetectomy and foraminotomies T1-T3  SURGEON:  Marikay Alaravid Jones, MD  ASSISTANTS: None  ANESTHESIA:   General  EBL: 200 ml  Total I/O In: 1800 [I.V.:1800] Out: 200 [Blood:200]  BLOOD ADMINISTERED: none  DRAINS: Medium Hemovac  SPECIMEN:  none  INDICATION FOR PROCEDURE: This patient presented with thoracic myelopathy. Imaging showed spinal stenosis T1-T2 3 T3-4. The patient tried conservative measures without relief. Pain was debilitating. Recommended T1-T3 laminectomy. Patient understood the risks, benefits, and alternatives and potential outcomes and wished to proceed.  PROCEDURE DETAILS: The patient was brought to the operating room. Generalized endotracheal anesthesia was induced. The patient was affixed a 3 point [place] headrest and rolled into the prone position on chest rolls. All pressure points were padded. The posterior cervical region and upper thoracic region was cleaned and prepped with DuraPrep and then draped in the usual sterile fashion. 7 cc of local anesthesia was injected and a dorsal midline incision made in the posterior thoracic region and carried down to the thoracic fascia. The fascia was opened and the paraspinous musculature was taken down to expose T1-T3. Intraoperative fluoroscopy confirmed my level and then the dissection was carried out over the lateral facets.  I removed the spinous processes of T1-T2 and T3.  T1 and T3 had less stenosis than T2-3 and therefore started at those levels.  Used a high-speed drill to perform a laminectomy bilaterally.  I drilled the lamina down to an eggshell in order to try to decompress the canal was gently has a possibly could.  The spinal cord was severely compressed especially at T2-3.  I then gently  decompressed the central canal with the 2 mm gold cervical Kerrison punch from T1 to T3-4. Medial facetectomies were performed.  Undercut the lateral recess to decompress the lateral part of the cord.  I dried the surgical bed with Surgifoam in the lateral gutters.  I irrigated with saline solution containing bacitracin. I placed a medium Hemovac drain through separate stab incision, and lined the dura with Gelfoam. After hemostasis was achieved I closed the muscle and the fascia with 0 Vicryl, subcutaneous tissue with 2-0 Vicryl, and the subcuticular tissue with 3-0 Vicryl. The skin was closed with benzoin and Steri-Strips. A sterile dressing was applied, the patient was turned to the supine position and taken out of the headrest, awakened from general anesthesia and transferred to the recovery room in stable condition. At the end of the procedure all sponge, needle and instrument counts were correct.     PLAN OF CARE: Admit for overnight observation  PATIENT DISPOSITION:  PACU - hemodynamically stable.   Delay start of Pharmacological VTE agent (>24hrs) due to surgical blood loss or risk of bleeding:  yes

## 2017-12-27 NOTE — Anesthesia Procedure Notes (Signed)
Procedure Name: Intubation Date/Time: 12/27/2017 4:38 PM Performed by: Eligha Bridegroom, CRNA Pre-anesthesia Checklist: Emergency Drugs available, Suction available, Patient identified, Timeout performed and Patient being monitored Patient Re-evaluated:Patient Re-evaluated prior to induction Oxygen Delivery Method: Circle system utilized Preoxygenation: Pre-oxygenation with 100% oxygen Induction Type: IV induction Ventilation: Mask ventilation without difficulty Laryngoscope Size: Mac and 4 Grade View: Grade II Tube type: Oral Tube size: 7.5 mm Number of attempts: 1 Airway Equipment and Method: Stylet Placement Confirmation: ETT inserted through vocal cords under direct vision,  positive ETCO2 and breath sounds checked- equal and bilateral Secured at: 23 cm Tube secured with: Tape

## 2017-12-27 NOTE — Anesthesia Postprocedure Evaluation (Signed)
Anesthesia Post Note  Patient: Scott Lane  Procedure(s) Performed: Laminectomy and Foraminotomy - Thoracic one - Thoracic three (N/A Back)     Patient location during evaluation: PACU Anesthesia Type: General Level of consciousness: awake and alert Pain management: pain level controlled Vital Signs Assessment: post-procedure vital signs reviewed and stable Respiratory status: spontaneous breathing, nonlabored ventilation and respiratory function stable Cardiovascular status: blood pressure returned to baseline and stable Postop Assessment: no apparent nausea or vomiting Anesthetic complications: no    Last Vitals:  Vitals:   12/27/17 2000 12/27/17 2015  BP: (!) 152/99 137/90  Pulse: 98 96  Resp: 12 14  Temp:  36.7 C  SpO2: 99% 100%    Last Pain:  Vitals:   12/27/17 2015  TempSrc:   PainSc: 5     LLE Motor Response: Purposeful movement;Responds to commands (12/27/17 2015) LLE Sensation: Full sensation (12/27/17 2015) RLE Motor Response: Purposeful movement;Responds to commands (12/27/17 2015) RLE Sensation: Full sensation (12/27/17 2015)      Beryle Lathehomas E [name]

## 2017-12-27 NOTE — Telephone Encounter (Signed)
Hello Dr. Yetta BarreJones, We are discharging Scott Lane today in preparation for his surgery today.  How long of a recovery time do you anticipate he will need before he can return to outpatient therapy?  He also has been participating in aquatic therapy once a week - how long will he need for incision healing before he can return to aquatic therapy?  We have a long wait list of referrals and I am hoping to block a re-eval slot for him so he doesn't have to wait a month or more to return once cleared for activity.  Thank you! Scott Lane, PT, DPT 12/27/17    8:25 AM

## 2017-12-28 ENCOUNTER — Encounter (HOSPITAL_COMMUNITY): Payer: Self-pay | Admitting: Neurological Surgery

## 2017-12-28 DIAGNOSIS — Z8249 Family history of ischemic heart disease and other diseases of the circulatory system: Secondary | ICD-10-CM | POA: Diagnosis not present

## 2017-12-28 DIAGNOSIS — M4714 Other spondylosis with myelopathy, thoracic region: Secondary | ICD-10-CM | POA: Diagnosis present

## 2017-12-28 DIAGNOSIS — Z79899 Other long term (current) drug therapy: Secondary | ICD-10-CM | POA: Diagnosis not present

## 2017-12-28 DIAGNOSIS — Z981 Arthrodesis status: Secondary | ICD-10-CM | POA: Diagnosis not present

## 2017-12-28 DIAGNOSIS — M4804 Spinal stenosis, thoracic region: Secondary | ICD-10-CM | POA: Diagnosis present

## 2017-12-28 MED ORDER — HYDROXYZINE HCL 50 MG/ML IM SOLN: 50.0000 mg | Freq: Four times a day (QID) | INTRAMUSCULAR | Status: DC | PRN

## 2017-12-28 NOTE — Evaluation (Signed)
Physical Therapy Evaluation Patient Details Name: Scott Lane MRN: [id number] DOB: 1985-04-15 Today's Date: 12/28/2017   History of Present Illness  Pt is 32 yo male with PMH: T9-12 laminectomy who returns for T1-3 laminectomy. He has been going to outpt PT since first surgery and has been progressing ambulation with a cane.   Clinical Impression  Pt admitted with above diagnosis. Pt currently with functional limitations due to the deficits listed below (see PT Problem List). Pt has had nausea, vomiting, and pain this morning. Pt ambulated 200' with RW and min-guard A. Plans to return to OPPT next Wednesday.  Pt will benefit from skilled PT to increase their independence and safety with mobility to allow discharge to the venue listed below.       Follow Up Recommendations Outpatient PT    Equipment Recommendations  None recommended by PT    Recommendations for Other Services       Precautions / Restrictions Precautions Precautions: Back Precaution Comments: reviewed precautions with patient Restrictions Weight Bearing Restrictions: No      Mobility  Bed Mobility Overal bed mobility: Needs Assistance Bed Mobility: Rolling;Sidelying to Sit;Sit to Sidelying Rolling: Supervision Sidelying to sit: Supervision     Sit to sidelying: Supervision General bed mobility comments: supervision to ensure log roll technique and adherance to precautions   Transfers Overall transfer level: Needs assistance Equipment used: Rolling walker (2 wheeled) Transfers: Sit to/from Stand Sit to Stand: Min guard;+2 safety/equipment         General transfer comment: +2 safety initally but progressing to supervison of +1  Ambulation/Gait Ambulation/Gait assistance: +2 safety/equipment;Min guard Gait Distance (Feet): 200 Feet Assistive device: Rolling walker (2 wheeled) Gait Pattern/deviations: Step-through pattern;Decreased weight shift to left Gait velocity: decreased Gait velocity  interpretation: 1.31 - 2.62 ft/sec, indicative of limited community ambulator General Gait Details: vc's for erect posture and abdominal activation. Pt had nausea and vomiting when sitting EOB but improved while ambulating.   Stairs            Wheelchair Mobility    Modified Rankin (Stroke Patients Only)       Balance Overall balance assessment: Needs assistance Sitting-balance support: No upper extremity supported;Feet supported Sitting balance-Leahy Scale: Fair     Standing balance support: During functional activity;Bilateral upper extremity supported Standing balance-Leahy Scale: Poor Standing balance comment: reliant on UE support                             Pertinent Vitals/Pain Pain Assessment: 0-10 Pain Score: 7  Pain Location: upper back Pain Descriptors / Indicators: Aching;Discomfort;Operative site guarding;Grimacing Pain Intervention(s): Limited activity within patient's tolerance;Monitored during session;Premedicated before session    Home Living Family/patient expects to be discharged to:: Private residence Living Arrangements: Spouse/significant other;Children Available Help at Discharge: Family;Available 24 hours/day Type of Home: House Home Access: Stairs to enter Entrance Stairs-Rails: Doctor, general practice of Steps: 1 Home Layout: Two level;1/2 bath on main level;Bed/bath upstairs Home Equipment: Shower seat - built in;Hand held Careers information officer - 2 wheels;Gilmer Mor - single point Additional Comments: wife currently on maternity leave until mid October    Prior Function Level of Independence: Independent with assistive device(s)         Comments: works as a Pharmacist, hospital, was independent ADLs and attending OP PT. Used RW in home and [facility] in community     Hand Dominance   Dominant Hand: Right    Extremity/Trunk Assessment  Upper Extremity Assessment Upper Extremity Assessment: Defer to OT evaluation     Lower Extremity Assessment Lower Extremity Assessment: LLE deficits/detail LLE Deficits / Details: L foot drop noted (though pt reports has improved since first surgery, has 2/5 df), knee ext 3+/5, knee flex 3+/5, hip flex 3/5 LLE Sensation: decreased proprioception LLE Coordination: decreased fine motor    Cervical / Trunk Assessment Cervical / Trunk Assessment: Other exceptions Cervical / Trunk Exceptions: s/p throacic sx   Communication   Communication: No difficulties  Cognition Arousal/Alertness: Awake/alert Behavior During Therapy: WFL for tasks assessed/performed Overall Cognitive Status: Within Functional Limits for tasks assessed                                        General Comments General comments (skin integrity, edema, etc.): very motivated to get back to work and gym    Exercises     Assessment/Plan    PT Assessment Patient needs continued PT services  PT Problem List Decreased strength;Decreased activity tolerance;Decreased balance;Decreased mobility;Pain       PT Treatment Interventions DME instruction;Gait training;Stair training;Functional mobility training;Therapeutic activities;Therapeutic exercise;Balance training;Patient/family education;Neuromuscular re-education    PT Goals (Current goals can be found in the Care Plan section)  Acute Rehab PT Goals Patient Stated Goal: to get home and back to outpatient PT  PT Goal Formulation: With patient Time For Goal Achievement: 01/04/18 Potential to Achieve Goals: Good    Frequency Min 5X/week   Barriers to discharge        Co-evaluation PT/OT/SLP Co-Evaluation/Treatment: Yes Reason for Co-Treatment: Complexity of the patient's impairments (multi-system involvement);To address functional/ADL transfers PT goals addressed during session: Mobility/safety with mobility;Balance;Proper use of DME;Strengthening/ROM OT goals addressed during session: ADL's and self-care;Other  (comment)(mobility)       AM-PAC PT "6 Clicks" Daily Activity  Outcome Measure Difficulty turning over in bed (including adjusting bedclothes, sheets and blankets)?: A Little Difficulty moving from lying on back to sitting on the side of the bed? : A Little Difficulty sitting down on and standing up from a chair with arms (e.g., wheelchair, bedside commode, etc,.)?: A Little Help needed moving to and from a bed to chair (including a wheelchair)?: A Little Help needed walking in hospital room?: A Little Help needed climbing 3-5 steps with a railing? : A Little 6 Click Score: 18    End of Session Equipment Utilized During Treatment: Gait belt Activity Tolerance: Patient tolerated treatment well Patient left: in bed;with call bell/phone within reach Nurse Communication: Mobility status PT Visit Diagnosis: Unsteadiness on feet (R26.81);Pain;Muscle weakness (generalized) (M62.81) Pain - part of body: (thoracic spine)    Time: [id number] PT Time Calculation (min) (ACUTE ONLY): 50 min   Charges:   PT Evaluation $PT Eval Moderate Complexity: 1 Mod PT Treatments $Gait Training: 8-22 mins        Lyanne CoVictoria Aubrii Sharpless, PT  Acute Rehab Services  Pager [phone] Office [phone]   Lawana ChambersVictoria L Aryaa Bunting 12/28/2017, 2:06 PM

## 2017-12-28 NOTE — Progress Notes (Signed)
Postop day 1.  Overall pain controlled.  Patient states that his legs feel better.  Beginning to mobilize.  Has been able to void small amounts.  Afebrile.  Vital signs are stable.  Urine output good.  Drain output moderate.  Patient with spastic weakness in both lower extremities improved from preop.  Dressing clean and dry.  Abdomen soft.  Progressing well.  Continue efforts at mobilization.  Possible discharge home tomorrow.

## 2017-12-28 NOTE — Accreditation Note (Signed)
Patient transferred to [id number]. [facility].  Pt still unable to void. I&O cath done @0012  with 550 urine outputl Bladder scan done at 0515 - result 274 ml. Patient also c/o nausea everytime the patient get OOB- zofran x2 given. Gait unsteady, dragging left foot.however denies numbness x e extremities. Assist x2 person w walker assist.  Hemovac output 120 ml at 2200 and  Another 120 ml at 0400. All personal belongings  With patient during transfer.

## 2017-12-28 NOTE — Evaluation (Signed)
Occupational Therapy Evaluation Patient Details Name: Scott Lane MRN: [id number] DOB: [birth year] Today's Date: 12/28/2017    History of Present Illness Pt is 32 yo male with PMH: T9-12 laminectomy who returns for T1-3 laminectomy. He has been going to outpt PT since first surgery and has been progressing ambulation with a cane.    Clinical Impression   PTA patient independent and working (using walker/cane for mobility), attending outpatient PT.  Limited by pain/nausea, back precautions, impaired balance, and decreased activity tolerance. Currently requires setup for UB ADL, min guard for LB ADL, grooming with supervision standing at sink, min guard +2 safety for transfers and mobility--progressing to close supervision of +1, and bed mobility with supervision. Reviewed precautions, safety, mobility, ADL compensatory techniques/DME recommendations.  He will benefit from continued OT services while admitted in order to maximize independence with ADLs and mobility in order to return to PLOF while adhering to precautions.  Will continue to follow while admitted, but not further services after d/c.     Follow Up Recommendations  No OT follow up;Supervision - Intermittent    Equipment Recommendations  None recommended by OT    Recommendations for Other Services       Precautions / Restrictions Precautions Precautions: Back Precaution Comments: reviewed precautions with patient Restrictions Weight Bearing Restrictions: No      Mobility Bed Mobility Overal bed mobility: Needs Assistance Bed Mobility: Rolling;Sidelying to Sit;Sit to Sidelying Rolling: Supervision Sidelying to sit: Supervision     Sit to sidelying: Supervision General bed mobility comments: supervision to ensure log roll technique and adherance to precautions   Transfers Overall transfer level: Needs assistance Equipment used: Rolling walker (2 wheeled) Transfers: Sit to/from Stand Sit to Stand: Min guard;+2  safety/equipment         General transfer comment: +2 safety initally but progressing to supervison of +1    Balance Overall balance assessment: Needs assistance Sitting-balance support: No upper extremity supported;Feet supported Sitting balance-Leahy Scale: Fair     Standing balance support: During functional activity;Bilateral upper extremity supported Standing balance-Leahy Scale: Poor Standing balance comment: reliant on B UE support, leaning hips against sink with 0 hand support during grooming tasks                            ADL either performed or assessed with clinical judgement   ADL Overall ADL's : Needs assistance/impaired     Grooming: Supervision/safety;Standing   Upper Body Bathing: Set up;Sitting   Lower Body Bathing: Minimal assistance;Sit to/from stand;Cueing for back precautions Lower Body Bathing Details (indicate cue type and reason): reviewed use of long sponge for bathing for increased ease and precatuions  Upper Body Dressing : Set up;Sitting   Lower Body Dressing: Sit to/from stand;Cueing for back precautions;Min guard Lower Body Dressing Details (indicate cue type and reason): able to complete figure 4 technique with supervision, min guard for standing  Toilet Transfer: Min guard;Ambulation;RW(simulated in room, increased time and effort )   Toileting- Clothing Manipulation and Hygiene: Supervision/safety       Functional mobility during ADLs: Min guard;Rolling walker General ADL Comments: Patient limited by pain and nausea (vomiting once sitting EOB), provided education on positioning and ADL compensatory techniques. +2 assist available, but not necessary as patients pain is much improved compared to yesterday     Vision Patient Visual Report: No change from baseline Vision Assessment?: No apparent visual deficits     Perception     Praxis  Pertinent Vitals/Pain Pain Assessment: 0-10 Pain Score: 7  Pain Descriptors /  Indicators: Aching;Discomfort;Operative site guarding;Grimacing Pain Intervention(s): Limited activity within patient's tolerance;Repositioned     Hand Dominance Right   Extremity/Trunk Assessment Upper Extremity Assessment Upper Extremity Assessment: Overall WFL for tasks assessed   Lower Extremity Assessment Lower Extremity Assessment: Defer to PT evaluation   Cervical / Trunk Assessment Cervical / Trunk Assessment: Other exceptions Cervical / Trunk Exceptions: s/p throacic sx    Communication Communication Communication: No difficulties   Cognition Arousal/Alertness: Awake/alert Behavior During Therapy: WFL for tasks assessed/performed Overall Cognitive Status: Within Functional Limits for tasks assessed                                     General Comments  highly motivated    Exercises     Shoulder Instructions      Home Living Family/patient expects to be discharged to:: Private residence Living Arrangements: Spouse/significant other;Children Available Help at Discharge: Family;Available 24 hours/day(spouse on maternity leave ) Type of Home: House Home Access: Stairs to enter [employer] of Steps: 1 Entrance Stairs-Rails: Right;Left Home Layout: Two level;1/2 bath on main level;Bed/bath upstairs Alternate Level Stairs-Number of Steps: 15 steps to second floor Alternate Level Stairs-Rails: Right Bathroom Shower/Tub: Chief Strategy Officer: Standard     Home Equipment: Shower seat - built in;Hand held Careers information officer - 2 wheels;Cane - single point          Prior Functioning/Environment Level of Independence: Independent with assistive device(s)        Comments: works as a Pharmacist, hospital, was independent ADLs and attending OP PT         OT Problem List: Decreased activity tolerance;Impaired balance (sitting and/or standing);Decreased knowledge of use of DME or AE;Decreased knowledge of  precautions;Pain      OT Treatment/Interventions: Self-care/ADL training;Therapeutic exercise;Energy conservation;DME and/or AE instruction;Patient/family education;Balance training;Therapeutic activities    OT Goals(Current goals can be found in the care plan section) Acute Rehab OT Goals Patient Stated Goal: to get home and back to outpatient PT  OT Goal Formulation: With patient Time For Goal Achievement: 01/11/18 Potential to Achieve Goals: Good  OT Frequency: Min 2X/week   Barriers to D/C:            Co-evaluation PT/OT/SLP Co-Evaluation/Treatment: Yes Reason for Co-Treatment: For patient/therapist safety;To address functional/ADL transfers   OT goals addressed during session: ADL's and self-care;Other (comment)(mobility)      AM-PAC PT "6 Clicks" Daily Activity     Outcome Measure Help from another person eating meals?: None Help from another person taking care of personal grooming?: None Help from another person toileting, which includes using toliet, bedpan, or urinal?: None Help from another person bathing (including washing, rinsing, drying)?: A Little Help from another person to put on and taking off regular upper body clothing?: None Help from another person to put on and taking off regular lower body clothing?: A Little 6 Click Score: 22   End of Session Equipment Utilized During Treatment: Gait belt;Rolling walker Nurse Communication: Mobility status  Activity Tolerance: Patient tolerated treatment well Patient left: in bed;with call bell/phone within reach  OT Visit Diagnosis: Unsteadiness on feet (R26.81);Other abnormalities of gait and mobility (R26.89);Pain Pain - part of body: (back)                Time: 1610-9604 OT Time Calculation (min): 49 min Charges:  OT General Charges $OT Visit: 1 Visit OT Evaluation $OT Eval Low Complexity: 1 Low  Chancy Milroy, [place] Acute Rehabilitation Services Pager [phone] Office [phone]   Chancy Milroy 12/28/2017, 12:29 PM

## 2017-12-29 MED ORDER — HYDROCODONE-ACETAMINOPHEN 7.5-325 MG PO TABS: 1.0000 | ORAL_TABLET | ORAL | 0 refills | Status: DC | PRN

## 2017-12-29 NOTE — Progress Notes (Signed)
Subjective: Patient reports feeling better  Objective: Vital signs in last 24 hours: Temp:  [97.9 F (36.6 C)-98.2 F (36.8 C)] 98.2 F (36.8 C) (09/22 1321) Pulse Rate:  [69-101] 74 (09/22 1321) Resp:  [16-18] 16 (09/22 1321) BP: (114-147)/(69-85) 118/75 (09/22 1321) SpO2:  [94 %-100 %] 96 % (09/22 1321)  Intake/Output from previous day: 09/21 0701 - 09/22 0700 In: -  Out: 820 [Urine:650; Drains:170] Intake/Output this shift: No intake/output data recorded.  Physical Exam: Spastic gait, but walking with walker  Dressing CDI.  Lab Results: No results for input(s): WBC, HGB, HCT, PLT in the last 72 hours. BMET No results for input(s): NA, K, CL, CO2, GLUCOSE, BUN, CREATININE, CALCIUM in the last 72 hours.  Studies/Results: Dg Thoracic Spine 1 View  Result Date: 12/27/2017 CLINICAL DATA:  DECOMPRESSIVE THORACIC LAMINECTOMY T1-T3 EXAM: OPERATIVE THORACIC SPINE 1 VIEW(S) COMPARISON:  Thoracic spine CT 07/31/2017 FINDINGS: A single spot fluoro image obtained portably at 1719 hours is submitted. The tip of a surgical clamp is superimposed on the C7 vertebral body. Endotracheal tube evident. IMPRESSION: Intraoperative localization. Electronically Signed   By: Kennith CenterEric  Mansell M.D.   On: 12/27/2017 17:33   Dg C-arm 1-60 Min  Result Date: 12/27/2017 CLINICAL DATA:  DECOMPRESSIVE THORACIC LAMINECTOMY T1-T3 EXAM: OPERATIVE THORACIC SPINE 1 VIEW(S) COMPARISON:  Thoracic spine CT 07/31/2017 FINDINGS: A single spot fluoro image obtained portably at 1719 hours is submitted. The tip of a surgical clamp is superimposed on the C7 vertebral body. Endotracheal tube evident. IMPRESSION: Intraoperative localization. Electronically Signed   By: Kennith CenterEric  Mansell M.D.   On: 12/27/2017 17:33    Assessment/Plan: D/C drain.  Discharge home.    LOS: 2 days    Dorian HeckleSTERN,Betzy Barbier D, MD 12/29/2017, 1:37 PM

## 2017-12-29 NOTE — Discharge Summary (Signed)
Physician Discharge Summary  Patient ID: Scott Lane MRN: [id number] DOB/AGE: [birth year] 32 y.o.  Admit date: 12/27/2017 Discharge date: 12/29/2017  Admission Diagnoses:Thoracic spinal stenosis T1-T3 with myelopathy  Discharge Diagnoses: Thoracic spinal stenosis T1-T3 with myelopathy Active Problems:   S/P lumbar laminectomy   Discharged Condition: good  Hospital Course: Patient was admitted on same day as procedure basis for Decompressive thoracic laminectomy, medial facetectomy and foraminotomies T1-T3.  He did well with this surgery and was discharged home on POD 2.  Consults: None  Significant Diagnostic Studies: None  Treatments: surgery: Patient was admitted on same day as procedure basis for Decompressive thoracic laminectomy, medial facetectomy and foraminotomies T1-T3  Discharge Exam: Blood pressure 118/75, pulse 74, temperature 98.2 F (36.8 C), temperature source Oral, resp. rate 16, height 6' (1.829 m), weight 113.4 kg, SpO2 96 %. Neurologic: Alert and oriented X 3, normal strength and tone. Normal symmetric reflexes. Normal coordination and gait Wound:CDI  Disposition: Home  Discharge Instructions    Diet - low sodium heart healthy   Complete by:  As directed    Increase activity slowly   Complete by:  As directed      Allergies as of 12/29/2017   No Known Allergies     Medication List    TAKE these medications   B COMPLEX PO Take 1 tablet by mouth daily.   baclofen 20 MG tablet Commonly known as:  LIORESAL Take 20 mg by mouth 3 (three) times daily.   HYDROcodone-acetaminophen 7.5-325 MG tablet Commonly known as:  NORCO Take 1-2 tablets by mouth every 4 (four) hours as needed for moderate pain.   lidocaine 5 % Commonly known as:  LIDODERM Place 1 patch onto the skin daily as needed (pain). Remove & Discard patch within 12 hours or as directed by MD   ONE DAILY MULTIVITAMIN MEN PO Take 1 tablet by mouth daily.   tiZANidine 4 MG  tablet Commonly known as:  ZANAFLEX Take 1 tablet (4 mg total) by mouth 3 (three) times daily.        Signed: Dorian HeckleSTERN,Jaedin Regina D, MD 12/29/2017, 1:39 PM

## 2017-12-29 NOTE — Progress Notes (Signed)
Patient discharged home with spouse. Hemovac and IV discontinued. Discharge information and prescription given. Patient and family questions asked and answered. Patient transported from unit via wheelchair with staff. Lawson RadarHeather M Lasonya Hubner

## 2017-12-29 NOTE — Progress Notes (Signed)
Physical Therapy Treatment Patient Details Name: Scott Lane MRN: [id number] DOB: [birth year] Today's Date: 12/29/2017    History of Present Illness Pt is 32 yo male with PMH: T9-12 laminectomy who returns for T1-3 laminectomy. He has been going to outpt PT since first surgery and has been progressing ambulation with a cane.     PT Comments    Continuing work on functional mobility and activity tolerance;  Session focused on walking and stair training, and while pt is moving slowly, overall he is managing well   Follow Up Recommendations  Outpatient PT     Equipment Recommendations  None recommended by PT    Recommendations for Other Services       Precautions / Restrictions Precautions Precautions: Back Precaution Comments: reviewed precautions with patient    Mobility  Bed Mobility Overal bed mobility: Needs Assistance Bed Mobility: Rolling;Sidelying to Sit Rolling: Supervision Sidelying to sit: Supervision     Sit to sidelying: Supervision General bed mobility comments: supervision to ensure log roll technique and adherance to precautions   Transfers Overall transfer level: Needs assistance Equipment used: Rolling walker (2 wheeled) Transfers: Sit to/from Stand Sit to Stand: Min guard         General transfer comment: Stood slowly from low bed; Cues for hand placement and safety  Ambulation/Gait Ambulation/Gait assistance: Min guard Gait Distance (Feet): 150 Feet Assistive device: Rolling walker (2 wheeled) Gait Pattern/deviations: Step-through pattern;Decreased weight shift to left Gait velocity: decreased   General Gait Details: Cues to self-monitor for activity tolerance; slow moving, and dependent on UE support on RW   Stairs Stairs: Yes Stairs assistance: Min guard Stair Management: Two rails;Step to pattern;Forwards Number of Stairs: 12 General stair comments: Cues for sequence; Overall good performance on steps   Wheelchair Mobility     Modified Rankin (Stroke Patients Only)       Balance     Sitting balance-Leahy Scale: Fair       Standing balance-Leahy Scale: Poor Standing balance comment: reliant on UE support                            Cognition Arousal/Alertness: Awake/alert Behavior During Therapy: WFL for tasks assessed/performed Overall Cognitive Status: Within Functional Limits for tasks assessed                                        Exercises      General Comments General comments (skin integrity, edema, etc.): very motivated to get back to work and gym      Pertinent Vitals/Pain Pain Assessment: Faces Faces Pain Scale: Hurts even more Pain Location: upper back Pain Descriptors / Indicators: Aching;Discomfort;Operative site guarding;Grimacing Pain Intervention(s): Monitored during session    Home Living                      Prior Function            PT Goals (current goals can now be found in the care plan section) Acute Rehab PT Goals Patient Stated Goal: to get home and back to outpatient PT  PT Goal Formulation: With patient Time For Goal Achievement: 01/04/18 Potential to Achieve Goals: Good Progress towards PT goals: Progressing toward goals    Frequency    Min 5X/week      PT Plan Current plan remains appropriate  Co-evaluation              AM-PAC PT "6 Clicks" Daily Activity  Outcome Measure  Difficulty turning over in bed (including adjusting bedclothes, sheets and blankets)?: A Little Difficulty moving from lying on back to sitting on the side of the bed? : A Little Difficulty sitting down on and standing up from a chair with arms (e.g., wheelchair, bedside commode, etc,.)?: A Little Help needed moving to and from a bed to chair (including a wheelchair)?: A Little Help needed walking in hospital room?: A Little Help needed climbing 3-5 steps with a railing? : A Little 6 Click Score: 18    End of Session  Equipment Utilized During Treatment: Gait belt Activity Tolerance: Patient tolerated treatment well Patient left: Other (comment)(Washing up at sink) Nurse Communication: Mobility status;Other (comment)(ended session with pt washing up at sink) PT Visit Diagnosis: Unsteadiness on feet (R26.81);Pain;Muscle weakness (generalized) (M62.81) Pain - part of body: (thoracic spine)     Time: 1610-9604 PT Time Calculation (min) (ACUTE ONLY): 26 min  Charges:  $Gait Training: 23-37 mins                     Van Clines, [place]  Acute Rehabilitation Services Pager [phone] Office [phone]    Scott Lane 12/29/2017, 12:51 PM

## 2017-12-29 NOTE — Progress Notes (Signed)
OT Cancellation Note  Patient Details Name: Scott Lane MRN: [id number] DOB: 12-23-1985   Cancelled Treatment:    Reason Eval/Treat Not Completed: Patient declined, no reason specified. Pt declined further needs to practice tub transfer.  Plans to dc home today and reports "I think I'll be okay".    Chancy Milroyhristie S Yalitza Teed, OT Acute Rehabilitation Services Pager [phone] Office [phone]   Chancy MilroyChristie S Ellwyn Ergle 12/29/2017, 2:54 PM

## 2017-12-30 MED FILL — Thrombin (Recombinant) For Soln 20000 Unit: CUTANEOUS | Qty: 1 | Status: AC

## 2017-12-31 ENCOUNTER — Other Ambulatory Visit: Payer: Self-pay | Admitting: *Deleted

## 2017-12-31 ENCOUNTER — Telehealth: Payer: Self-pay | Admitting: Physical Therapy

## 2017-12-31 NOTE — Telephone Encounter (Signed)
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  If you feel he is ready to begin outpatient therapy, would you please enter a new PT eval and treat order into Epic and any activity restrictions he may have post op?  Thank you, Dierdre HighmanAudra F Chelcie Estorga, PT, DPT 12/31/17    1:47 PM

## 2017-12-31 NOTE — Patient Outreach (Addendum)
[facility] Care Management  12/31/2017  Scott Lane 04-25-1985 [id number]  Subjective: Telephone call to patient's home  / mobile number, no answer, left HIPAA compliant voicemail message, and requested call Lane.    Objective:Per KPN (Knowledge Performance Now, point of care tool) and chart review, patient hospitalized 12/27/17 - 12/29/17 for Thoracic spinal stenosis T1-T3 with myelopathy, status post Decompressive thoracic laminectomy, medial facetectomy and foraminotomies T1-T3 on 12/27/17.   Patient hospitalized inpatient rehab 08/05/17 - 08/12/17 forThoracic stenosis with myelopathy, status post decompressive thoracic laminectomyon 08/01/17. Patient also hospitalized 07/31/17 -08/05/17 for thoracic stenosis with myelopathy. [facility] completed last transition of care follow up on 08/19/17.      Assessment:Received [facility] Transition of care referral on 12/30/17. Transition of care follow up pending patient contact.       Plan: RNCM will send unsuccessful outreach  letter, [facility] pamph[facility], will call patient for 2nd telephone outreach attempt, transition of care follow up, and proceed with case closure, within 10 business days if no return call.       Amila Callies H. Gardiner Barefootooper RN, BSN, CCM [facility] Telephonic CM Phone: [phone] Fax: [phone]

## 2018-01-01 ENCOUNTER — Ambulatory Visit: Payer: 59 | Admitting: Physical Therapy

## 2018-01-01 ENCOUNTER — Encounter: Payer: Self-pay | Admitting: *Deleted

## 2018-01-01 ENCOUNTER — Other Ambulatory Visit: Payer: Self-pay | Admitting: *Deleted

## 2018-01-01 NOTE — Patient Outreach (Addendum)
[facility]) Care Management  01/01/2018  Scott Lane 09-26-1985 [id number]   Subjective: Received voicemail message from Scott Lane, states he is returning my call, and requested call back. Telephone call to patient's home / mobile number, spoke with patient, and HIPAA verified.  Discussed [facility] Care Management [facility] Transition of care follow up, patient voiced understanding, and is in agreement to follow up.    Patient states he is doing better, has a follow up appointment with surgeon on 01/13/18, trying to get order for outpatient physical therapy sent to [facility], and prevent further delay in his rehab.   States he has left a message with Dr. Yetta Lane office regarding outpatient physical therapy order and has not received a call back.  States his physical therapy appointment was cancelled  today because the provider had not received an order from Dr. Yetta Lane, and stated they will also cancel next appointment on 01/03/18, if order not received.   States MD did not send the order to physical therapy provider upon hospital discharge and provider has also reached out to Dr. Yetta Lane office to obtain order, and has not received a response.  RNCM advised will follow up with Dr. Yetta Lane' office [phone]) to verify status of outpatient physical therapy order and call patient back with an update.  Patient states he is able to manage self care and has assistance as needed.  States he is currently at work.  Patient voices understanding of medical diagnosis, surgery, and treatment plan.  States he is accessing the following [facility] benefits: outpatient pharmacy, hospital indemnity, and  family medical leave act (FMLA) not needed at this time.  Patient states he does not have any education material, transition of care, care coordination, disease management, disease monitoring, transportation, community resource, or pharmacy needs at this time. States he is very appreciative of the follow up and is in  agreement to receive [facility] Care Management information.  Telephone call to Dr. Yetta Lane office, spoke with Alvis Lemmings, advised of my conversation with patient regarding order for outpatient physical therapy, patient unable to receive follow up care without MD order, transferred to Dr. Yetta Lane' secretary Aretta Nip) advised of my conversation with patient, states she is unable to assist, no message in patient's chart regarding this issue from patient or physical therapy provider, transferred to Dr. Yetta Lane medical assistant Lurena Joiner), left voicemail, and requested call back.  Telephone call to Dr. Yetta Lane office, spoke with Alvis Lemmings, advised of my conversation with Scott Lane (Dr. Yetta Lane' secretary), unable to speak with Lurena Joiner, left voice mail previously, requested to speak with someone that could assist, transferred to Rebecca's voicemail, and left voicemail message, and requested call back.  Telephone call to patient's home  / mobile number, no answer, left HIPAA compliant voicemail message, and requested call back.    Objective:Per KPN (Knowledge Performance Now, point of care tool) and chart review, patient hospitalized 12/27/17 - 12/29/17 for Thoracic spinal stenosis T1-T3 with myelopathy, status post Decompressive thoracic laminectomy, medial facetectomy and foraminotomies T1-T3 on 12/27/17.   Patient hospitalized inpatient rehab 08/05/17 - 08/12/17 forThoracic stenosis with myelopathy, status post decompressive thoracic laminectomyon 08/01/17. Patient also hospitalized 07/31/17 -08/05/17 for thoracic stenosis with myelopathy. [facility] Care Management completed last transition of care follow up on 08/19/17.      Assessment:Received [facility] Transition of care referral on 12/30/17.Transition of care follow up completed, no care management needs, and will proceed with case closure.      Plan:RNCM will send patient successful outreach letter, [facility] pamphlet,  and magnet. RNCM will complete case closure due to follow up  completed / no care management needs.        Scott Lane Scott Lane, BSN, CCM [facility] Care Management [facility] Telephonic CM Phone: [phone] Fax: [phone]

## 2018-01-01 NOTE — Patient Outreach (Addendum)
[facility]) Care Management  01/01/2018  DAVIDSON PALMIERI 1985-04-25 [id number]   Subjective: Received voicemail message from Donivan Scull, states he is returning call, and requested call back. Telephone call to patient's home / mobile number, spoke with patient, and HIPAA verified.  Discussed [facility] Care Management [facility]MR Transition of care follow up, patient voiced understanding, and is in agreement to follow up.   Patient states he has received a call from Lurena Joiner at Dr. Yetta Barre office, the outpatient physical therapy order was sent to the wrong provider (sent [facility] on [address]) , MD is aware, and it is being sent to the correct provider [facility] on [address] [place]).  Patient states he does not have any education material, transition of care, care coordination, disease management, disease monitoring, transportation, community resource, or pharmacy needs at this time. States he is very appreciative of the follow up and is in agreement to receive [facility] Care Management information.     Objective:Per KPN (Knowledge Performance Now, point of care tool) and chart review, patient hospitalized 12/27/17 - 12/29/17 forThoracic spinal stenosis T1-T3 with myelopathy, status postDecompressive thoracic laminectomy, medial facetectomy and foraminotomies T1-T3on 12/27/17. Patient hospitalized inpatient rehab 08/05/17 - 08/12/17 forThoracic stenosis with myelopathy, status post decompressive thoracic laminectomyon 08/01/17. Patient also hospitalized 07/31/17 -08/05/17 for thoracic stenosis with myelopathy. [facility] Care Management completed last transition of care follow up on 08/19/17.    Assessment:Received UMR Transition of care referral on 12/30/17.Transition of care follow up completed, no care management needs, and case will remain closed.      Plan:Case remains closed.       Shalaunda Weatherholtz H. Gardiner Barefoot, BSN,  CCM [facility] Care Management [facility] Telephonic CM Phone: [phone] Fax: [phone]

## 2018-01-02 ENCOUNTER — Encounter: Payer: Self-pay | Admitting: Family Medicine

## 2018-01-03 ENCOUNTER — Other Ambulatory Visit: Payer: Self-pay

## 2018-01-03 ENCOUNTER — Ambulatory Visit: Payer: 59 | Admitting: Physical Therapy

## 2018-01-03 ENCOUNTER — Encounter: Payer: Self-pay | Admitting: Physical Therapy

## 2018-01-03 DIAGNOSIS — R208 Other disturbances of skin sensation: Secondary | ICD-10-CM | POA: Diagnosis not present

## 2018-01-03 DIAGNOSIS — G8222 Paraplegia, incomplete: Secondary | ICD-10-CM

## 2018-01-03 DIAGNOSIS — M6281 Muscle weakness (generalized): Secondary | ICD-10-CM | POA: Diagnosis not present

## 2018-01-03 DIAGNOSIS — R2681 Unsteadiness on feet: Secondary | ICD-10-CM

## 2018-01-03 DIAGNOSIS — R29818 Other symptoms and signs involving the nervous system: Secondary | ICD-10-CM

## 2018-01-03 DIAGNOSIS — M546 Pain in thoracic spine: Secondary | ICD-10-CM | POA: Diagnosis not present

## 2018-01-03 DIAGNOSIS — R2689 Other abnormalities of gait and mobility: Secondary | ICD-10-CM

## 2018-01-03 NOTE — Patient Instructions (Addendum)
Flexibility: Upper Trapezius Stretch    Gently grasp right side of head while reaching behind back with other hand. Tilt head away until a gentle stretch is felt. Hold ____ seconds. Repeat ___5_ times per set. Do __2__ sets per session. Do _1__ sessions per day.  http://orth.exer.us/340   Copyright  VHI. All rights reserved.   Setup  Begin sitting in an upright position. Movement  Gently squeeze your shoulder blades together, relax, and then repeat. Tip  Make sure to maintain good posture during the exercise.

## 2018-01-03 NOTE — Therapy (Signed)
[facility] [address] [place], [place], [address] Phone: [phone]   Fax:  [phone]  Physical Therapy Evaluation  Patient Details  Name: Scott Lane MRN: [id number] Date of Birth: 1985-09-20 Referring Provider (PT): Marikay Alar, MD   Encounter Date: 01/03/2018  PT End of Session - 01/03/18 1557    Visit Number  1    Number of Visits  17    Date for PT Re-Evaluation  03/04/18    Authorization Type  [employer] Ded[employer]ible.  VL: MN    PT Start Time  0845    PT Stop Time  0930    PT Time Calculation (min)  45 min    Activity Tolerance  Patient limited by pain;Patient limited by fatigue    Behavior During Therapy  [facility] for tasks assessed/performed       Past Medical History:  Diagnosis Date  . Chicken pox     Past Surgical History:  Procedure Laterality Date  . LUMBAR LAMINECTOMY/DECOMPRESSION MICRODISCECTOMY N/A 08/01/2017   Procedure: Thoracic nine, ten, eleven, twelve laminectomy;  Surgeon: Tia Alert, MD;  Location: [facility] OR;  Service: Neurosurgery;  Laterality: N/A;  . none    . THORACIC DISCECTOMY N/A 12/27/2017   Procedure: Laminectomy and Foraminotomy - Thoracic one - Thoracic three;  Surgeon: Tia Alert, MD;  Location: [facility] OR;  Service: Neurosurgery;  Laterality: N/A;  . WISDOM TOOTH EXTRACTION     bilateral lower.    There were no vitals filed for this visit.   Subjective Assessment - 01/03/18 0857    Subjective  Pt reports he is currently ambulating with rolling walker because his cane is causing increased pain to mid thoracic region. Pt reports s/p surgery he was throwing up with inability to walk 2/2 pain and increased drainage and did not get discharged until Sunday evening. Pt returned to work Tuesday and reported earlier in the week, supine> sit caused him to throw up 2/2 incision sharp pain. Pt reports since Tuesday the pain has reduced to a dull pain. Pt reports feeling  difference in his LE's indicating he has greater control with reduction in stiffness. Pt reports reduction in clonus.     Patient is accompained by:  Family member   Wife T   Pertinent History  Patient enjoys maintaining a very active life style     Limitations  Standing;Walking;Lifting;Other (comment)   soreness in upper back region causes pain with inability for patient to ambulate with cane   Patient Stated Goals  Ambulation with no AD to get back to prior level of function.     Currently in Pain?  Yes    Pain Score  4     Pain Location  Back    Pain Orientation  Upper;Mid    Pain Descriptors / Indicators  Aching   Pt reports "uncomfortable pain"   Pain Type  Surgical pain    Pain Onset  In the past 7 days    Pain Frequency  Intermittent   eases with pain medication   Aggravating Factors   when moving from supine>sit increases pain/soreness between shoulder blades     Pain Relieving Factors  pain medication          [facility] PT Assessment - 01/03/18 0903      Assessment   Medical Diagnosis  Thoracic stenosis with myelopathy, s/p decompression laminectomy    Referring Provider (PT)  Marikay Alar, MD    Onset Date/Surgical Date  12/27/17   Referral date: 01/01/2018   Next MD Visit  01/13/2018    Prior Therapy  CIR after surgery      Precautions   Precautions  Fall      Restrictions   Weight Bearing Restrictions  No      Balance Screen   Has the patient fallen in the past 6 months  No    Has the patient had a decrease in activity level because of a fear of falling?   Yes    Is the patient reluctant to leave their home because of a fear of falling?   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      Leisure  works out regularly; former [employer]  Would like to return to driving      Cognition   Overall Cognitive Status  Within Functional Limits for tasks assessed      Sensation   Light Touch  Impaired Detail    Light Touch Impaired Details  Impaired LLE      Coordination   Gross Motor Movements are Fluid and Coordinated  No    Finger Nose Finger Test  R=L intact    Heel Shin Test  R WNL; L impaired 2/2 weakness and dec hip external rotation range of motion      Posture/Postural Control   Posture/Postural Control  Postural limitations    Postural Limitations  Flexed trunk      ROM / Strength   AROM / PROM / Strength  Strength;AROM      AROM   Overall AROM   Deficits    Overall AROM Comments  Pt demonstrates dec hip external ROM on L LE when attempting to perform L heel to shin       Strength   Overall Strength  Deficits    Overall Strength Comments  R LE: Overall, 4/5 when grossly assessed in sitting. L LE: Overall 4/5 with exception to L hip flexion and knee flexion assessed to be 4-       Transfers   Transfers  Sit to Stand;Stand to [employer] Transfers    Sit to Stand  5: Supervision    Stand to Sit  5: Supervision    Stand Pivot Transfers  5: Supervision    Comments  Pt requires increased time with inability to perform STS without UE assist from a standard height chair.       Ambulation/Gait   Ambulation/Gait  Yes    Ambulation/Gait Assistance  5: Supervision    Ambulation/Gait Assistance Details  Pt requires supervision with ambulation using RW    Ambulation Distance (Feet)  40 Feet    Assistive device  Rolling walker  Gait Pattern  Step-through pattern;Decreased stride length;Decreased hip/knee flexion - right;Decreased hip/knee flexion - left;Decreased weight shift to left;Decreased trunk rotation;Wide base of support    Ambulation Surface  Level;Indoor    Gait velocity  1.92 ft/sec using RW      Berg Balance Test   Sit to Stand  Able to stand using hands after several tries    Standing Unsupported  Able to stand safely 2 minutes    Sitting with Back Unsupported but Feet Supported on Floor or Stool  Able to sit safely and securely 2 minutes    Stand to Sit  Sits safely with minimal use of hands    Transfers  Able to transfer safely, definite need of hands    Standing Unsupported with Eyes Closed  Able to stand 10 seconds with supervision    Standing Ubsupported with Feet Together  Able to place feet together independently and stand for 1 minute with supervision    From Standing, Reach Forward with Outstretched Arm  Can reach confidently >25 cm (10")    From Standing Position, Pick up Object from Floor  Unable to pick up shoe, but reaches 2-5 cm (1-2") from shoe and balances independently   Requires L UE assist   From Standing Position, Turn to Look Behind Over each Shoulder  Looks behind one side only/other side shows less weight shift    Turn 360 Degrees  Able to turn 360 degrees safely but slowly    Standing Unsupported, Alternately Place Feet on Step/Stool  Able to complete >2 steps/needs minimal assist    Standing Unsupported, One Foot in Front  Needs help to step but can hold 15 seconds    Standing on One Leg  Able to lift leg independently and hold equal to or more than 3 seconds    Total Score  38    Berg comment:  38/56 indicating significant fall risk potential                Objective measurements completed on examination: See above findings.              PT Education - 01/03/18 1553    Education provided  Yes    Education Details  Therapist provided education regarding  proper body mechanics and posture at work to reduce mid thoracic tightness, application of ice for pain reduction, follow up with aquatic therapy timeline, and clinical findings during evaluation with education regarding POC.     Person(s) Educated  Patient;Spouse    Methods  Explanation    Comprehension  Verbalized understanding       PT Short Term Goals - 01/03/18 1608      PT SHORT TERM GOAL #1   Title  Pt will improve standing balance as indicated by increased BERG balance score to >/= 42/56    Baseline  9/27: 38/56 indicative of significant fall risk potential     Time  4    Period  Weeks    Status  New    Target Date  02/02/18      PT SHORT TERM GOAL #2   Title  Pt will participate in FGA assessment to establish fall risk potential with dynamic functional mobility     Time  4    Period  Weeks    Status  New    Target Date  02/02/18      PT SHORT TERM GOAL #3   Title  Pt will improve gait speed to >/=2.6 ft/sec  with LRAD indicating improvement with functional mobility placing him in the community ambulator category     Baseline  9/27: 1.92 ft/sec    Time  4    Period  Weeks    Status  New    Target Date  02/02/18      PT SHORT TERM GOAL #4   Title  Pt will perform 12 steps with single HR and SPC with reciprocal stepping pattern and supervision-min A indicating improvement with functional mobility and LE strength     Time  4    Period  Weeks    Status  New    Target Date  02/02/18      PT SHORT TERM GOAL #5   Title  Pt will be able to ambulate 500 ft in the community over uneven surfaces, grass, ramps, and curbs with supervision using LRAD for improvement in community ambulation distances    Baseline  Currently ambulating with RW     Time  4    Period  Weeks    Status  New    Target Date  02/02/18        PT Long Term Goals - 01/03/18 1612      PT LONG TERM GOAL #1   Title  Pt will be independent with balance and LE strengthening HEP to further improve  functional independence     Time  8    Period  Weeks    Status  New    Target Date  03/04/18      PT LONG TERM GOAL #2   Title  Pt will improve standing balance and BERG >/= 50/56 to decrease falls risk    Baseline  9/27 eval: 38/56    Time  8    Period  Weeks    Status  New    Target Date  03/04/18      PT LONG TERM GOAL #3   Title  Pt will improve gait velocity to >/= 3.0 ft/sec with LRAD indicating improvement in functional mobility     Baseline  9/27: 1.92 ft/sec    Time  8    Period  Weeks    Status  New    Target Date  03/04/18      PT LONG TERM GOAL #4   Title  Pt will ambulate x 1000 over outdoor surfaces + curb (paved and grassy) MOD I with LRAD to improve functional mobility with community distances    Time  8    Period  Weeks    Status  New    Target Date  03/04/18      PT LONG TERM GOAL #5   Title  Pt will negotiate 12 steps with alternating stepping MOD I using single HR and SPC to improve functional mobility     Time  8    Period  Weeks    Status  New    Target Date  03/04/18      PT LONG TERM GOAL #6   Title  Pt will demonstrate an FGA score of >/=19/30 to decrease fall risk potential with functional mobility using LRAD     Time  8    Period  Weeks    Status  New    Target Date  03/04/18             Plan - 01/03/18 1558    Clinical Impression Statement  Pt is a 32 year old male referred to Neuro OPPT for evaluation  of LE weakness due to thoracic myelopathy s/p second decompressive laminectomy; pt has been participating in outpatient PT following first surgery and had reached modified independent level with cane. Pt's PMH is significant for the following: No significant PMH.  The following deficits were noted during pt's exam: Impaired L LE sensation, impaired strength bilaterally L> R, and observable balance deficits. Pt's Berg balance score of 38/56 indicates he is considered a significant risk for falls and his gait speed of 1.92 ft/sec using a RW  places him in the category of limited community ambulator. Pt would benefit from skilled PT to address these impairments and functional limitations to maximize functional independence and reduce falls risk.    History and Personal Factors relevant to plan of care:  This is patient's second spinal surgery this year; with therapy pt had reached modified independent with cane and working full time. Has experienced slight deline in function since surgery.  He has a 73 month old baby girl that he must provide safe care for. He lives in a two level home with full flight to bed/ bathroom.     Clinical Presentation  Evolving    Clinical Presentation due to:  This is patient's second spinal surgery this year; with therapy pt had reached modified independent with cane and working full time. Has experienced slight deline in function since surgery.  He has a 48 month old baby girl that he must provide safe care for. He lives in a two level home with full flight to bed/ bathroom.     Clinical Decision Making  Moderate    Rehab Potential  Good    Clinical Impairments Affecting Rehab Potential  Pt currently reports pain and tightness to mid thoracic region near surgical incision which is limiting his functional mobility     PT Frequency  2x / week    PT Duration  8 weeks    PT Treatment/Interventions  ADLs/Self Care Home Management;Cryotherapy;Electrical Stimulation;Moist Heat;DME Instruction;Gait Network engineer;Therapeutic activities;Functional mobility training;Therapeutic exercise;Balance training;Neuromuscular re-education;Patient/family education;Orthotic Fit/Training;Passive range of motion;Taping;Aquatic Therapy    PT Next Visit Plan  FGA, balance, strengthening, reassess signs and symptoms     PT Home Exercise Plan  TBD    Consulted and Agree with Plan of Care  Patient;Family member/caregiver    Family Member Consulted  wife - "T"       Patient will benefit from skilled therapeutic intervention in  order to improve the following deficits and impairments:  Abnormal gait, Decreased balance, Decreased coordination, Decreased strength, Difficulty walking, Increased muscle spasms, Impaired sensation, Pain, Impaired tone, Decreased range of motion  Visit Diagnosis: Muscle weakness (generalized)  Other abnormalities of gait and mobility  Other disturbances of skin sensation  Other symptoms and signs involving the nervous system  Unsteadiness  Paraplegia, incomplete (HCC)  Pain in thoracic spine     Problem List Patient Active Problem List   Diagnosis Date Noted  . S/P lumbar laminectomy 12/27/2017  . Paraplegia (HCC) 11/07/2017  . Quadriplegia, unspecified (HCC) 11/07/2017  . Leukocytosis   . Neuropathic pain   . Drug induced constipation   . Urinary retention   . Class 1 obesity due to excess calories with serious comorbidity and body mass index (BMI) of 33.0 to 33.9 in adult   . Myelopathy (HCC) 08/01/2017  . Thoracic spinal stenosis 07/31/2017    Carney Living, SPT 01/03/2018, 4:19 PM  [facility] [address] [place], [place], [address] Phone: [phone]   Fax:  [phone]  Name: Scott Lane MRN: [id number] Date of Birth: 1986/01/25

## 2018-01-06 ENCOUNTER — Encounter: Payer: Self-pay | Admitting: Physical Therapy

## 2018-01-06 ENCOUNTER — Ambulatory Visit: Payer: 59 | Admitting: Physical Therapy

## 2018-01-06 DIAGNOSIS — M6281 Muscle weakness (generalized): Secondary | ICD-10-CM | POA: Diagnosis not present

## 2018-01-06 DIAGNOSIS — M546 Pain in thoracic spine: Secondary | ICD-10-CM

## 2018-01-06 DIAGNOSIS — R2681 Unsteadiness on feet: Secondary | ICD-10-CM | POA: Diagnosis not present

## 2018-01-06 DIAGNOSIS — R29818 Other symptoms and signs involving the nervous system: Secondary | ICD-10-CM | POA: Diagnosis not present

## 2018-01-06 DIAGNOSIS — R2689 Other abnormalities of gait and mobility: Secondary | ICD-10-CM | POA: Diagnosis not present

## 2018-01-06 DIAGNOSIS — R208 Other disturbances of skin sensation: Secondary | ICD-10-CM

## 2018-01-06 DIAGNOSIS — G8222 Paraplegia, incomplete: Secondary | ICD-10-CM

## 2018-01-06 NOTE — Patient Instructions (Signed)
Application of Kinesiotape  Ice for 15-20 minutes 3-4 times a day  Gentle AROM of neck into rotation and flexion/extension  Gentle scapular retraction in supine

## 2018-01-06 NOTE — Therapy (Addendum)
[facility] [address] [place], [place], [address] Phone: [phone]   Fax:  [phone]  Physical Therapy Treatment  Patient Details  Name: Scott Lane MRN: [id number] Date of Birth: 01-Aug-1985 Referring Provider (PT): Marikay Alar, MD   Encounter Date: 01/06/2018  PT End of Session - 01/06/18 0917    Visit Number  2    Number of Visits  17    Date for PT Re-Evaluation  03/04/18    Authorization Type  [place] Family Deductible.  VL: [place]    PT Start Time  0805    PT Stop Time  0847    PT Time Calculation (min)  42 min    Activity Tolerance  Patient limited by pain;Patient limited by fatigue    Behavior During Therapy  [facility] for tasks assessed/performed       Past Medical History:  Diagnosis Date  . Chicken pox     Past Surgical History:  Procedure Laterality Date  . LUMBAR LAMINECTOMY/DECOMPRESSION MICRODISCECTOMY N/A 08/01/2017   Procedure: Thoracic nine, ten, eleven, twelve laminectomy;  Surgeon: Tia Alert, MD;  Location: [facility] OR;  Service: Neurosurgery;  Laterality: N/A;  . none    . THORACIC DISCECTOMY N/A 12/27/2017   Procedure: Laminectomy and Foraminotomy - Thoracic one - Thoracic three;  Surgeon: Tia Alert, MD;  Location: [facility] OR;  Service: Neurosurgery;  Laterality: N/A;  . WISDOM TOOTH EXTRACTION     bilateral lower.    There were no vitals filed for this visit.  Subjective Assessment - 01/06/18 0809    Subjective  Still having significant discomfort in upper, mid back.  Not resting well.  Medication is not affecting the discomfort.  Ambulating with RW this morning.    Patient is accompained by:  Family member   Wife T   Pertinent History  Patient enjoys maintaining a very active life style     Limitations  Standing;Walking;Lifting;Other (comment)   soreness in upper back region causes pain with inability for patient to ambulate with cane   Patient Stated Goals  Ambulation with no AD  to get back to prior level of function.     Currently in Pain?  Yes    Pain Score  4     Pain Location  Back    Pain Orientation  Upper;Mid    Pain Descriptors / Indicators  Discomfort    Pain Type  Surgical pain    Pain Onset  In the past 7 days         [facility] PT Assessment - 01/06/18 0810      Functional Gait  Assessment   Gait assessed   Yes    Gait Level Surface  Walks 20 ft, slow speed, abnormal gait pattern, evidence for imbalance or deviates 10-15 in outside of the 12 in walkway width. Requires more than 7 sec to ambulate 20 ft.    Change in Gait Speed  Makes only minor adjustments to walking speed, or accomplishes a change in speed with significant gait deviations, deviates 10-15 in outside the 12 in walkway width, or changes speed but loses balance but is able to recover and continue walking.    Gait with Horizontal Head Turns  Performs head turns smoothly with slight change in gait velocity (eg, minor disruption to smooth gait path), deviates 6-10 in outside 12 in walkway width, or uses an assistive device.    Gait with Vertical Head Turns  Performs task with slight change  in gait velocity (eg, minor disruption to smooth gait path), deviates 6 - 10 in outside 12 in walkway width or uses assistive device    Gait and Pivot Turn  Turns slowly, requires verbal cueing, or requires several small steps to catch balance following turn and stop    Step Over Obstacle  Is able to step over one shoe box (4.5 in total height) but must slow down and adjust steps to clear box safely. May require verbal cueing.    Gait with Narrow Base of Support  Ambulates less than 4 steps heel to toe or cannot perform without assistance.    Gait with Eyes Closed  Cannot walk 20 ft without assistance, severe gait deviations or imbalance, deviates greater than 15 in outside 12 in walkway width or will not attempt task.    Ambulating Backwards  Walks 20 ft, slow speed, abnormal gait pattern, evidence for imbalance,  deviates 10-15 in outside 12 in walkway width.    Steps  Two feet to a stair, must use rail.    Total Score  10    FGA comment:  10/30                   [facility] Adult PT Treatment/Exercise - 01/06/18 0848      Manual Therapy   Manual Therapy  Taping    Kinesiotex  Edema;Inhibit Muscle      Kinesiotix   Edema  in H position: placed left and right lateral to thoracic incision and horizontally across shoulders    Inhibit Muscle   in H position: placed left and right lateral to thoracic incision and horizontally across shoulders             PT Education - 01/06/18 0916    Education provided  Yes    Education Details  FGA results, kinesiotape purpose and application, ice, gentle AROM    Person(s) Educated  Patient    Methods  Explanation;Demonstration    Comprehension  Verbalized understanding       PT Short Term Goals - 01/06/18 1304      PT SHORT TERM GOAL #1   Title  Pt will improve standing balance as indicated by increased BERG balance score to >/= 42/56  (ALL STG due by 02/02/18)    Baseline  9/27: 38/56 indicative of significant fall risk potential     Time  4    Period  Weeks    Status  New      PT SHORT TERM GOAL #2   Title  Pt will participate in FGA assessment to establish fall risk potential with dynamic functional mobility     Baseline  10/30    Time  4    Period  Weeks    Status  Achieved      PT SHORT TERM GOAL #3   Title  Pt will improve gait speed to >/=2.6 ft/sec with LRAD indicating improvement with functional mobility placing him in the community ambulator category     Baseline  9/27: 1.92 ft/sec    Time  4    Period  Weeks    Status  New      PT SHORT TERM GOAL #4   Title  Pt will perform 12 steps with single HR and SPC with reciprocal stepping pattern and supervision-min A indicating improvement with functional mobility and LE strength     Time  4    Period  Weeks    Status  New  PT SHORT TERM GOAL #5   Title  Pt will be  able to ambulate 500 ft in the community over uneven surfaces, grass, ramps, and curbs with supervision using LRAD for improvement in community ambulation distances    Baseline  Currently ambulating with RW     Time  4    Period  Weeks    Status  New        PT Long Term Goals - 01/06/18 1304      PT LONG TERM GOAL #1   Title  Pt will be independent with balance and LE strengthening HEP to further improve functional independence (ALL LTG due by 03/04/18)    Time  8    Period  Weeks    Status  New      PT LONG TERM GOAL #2   Title  Pt will improve standing balance and BERG >/= 50/56 to decrease falls risk    Baseline  9/27 eval: 38/56    Time  8    Period  Weeks    Status  New      PT LONG TERM GOAL #3   Title  Pt will improve gait velocity to >/= 3.0 ft/sec with LRAD indicating improvement in functional mobility     Baseline  9/27: 1.92 ft/sec    Time  8    Period  Weeks    Status  New      PT LONG TERM GOAL #4   Title  Pt will ambulate x 1000 over outdoor surfaces + curb (paved and grassy) MOD I with LRAD to improve functional mobility with community distances    Time  8    Period  Weeks    Status  New      PT LONG TERM GOAL #5   Title  Pt will negotiate 12 steps with alternating stepping MOD I using single HR and SPC to improve functional mobility     Time  8    Period  Weeks    Status  New      PT LONG TERM GOAL #6   Title  Pt will demonstrate an FGA score of >/=19/30 to decrease fall risk potential with functional mobility using LRAD     Baseline  10/30    Time  8    Period  Weeks    Status  New            Plan - 01/06/18 1610    Clinical Impression Statement  Initiated assessment of falls risk and balance during gait with FGA.  Pt demonstrates increased falls risk during gait and continues to require use of RW for safe ambulation.  Performed Kinesiotaping on either side of incision for edema management, improve blood flow, decrease pain and muscle  inhibition.  Pt reported improvement in discomfort after taping.  Continued to recommend other ways to manage pain.     Rehab Potential  Good    Clinical Impairments Affecting Rehab Potential  Pt currently reports pain and tightness to mid thoracic region near surgical incision which is limiting his functional mobility     PT Frequency  2x / week    PT Duration  8 weeks    PT Treatment/Interventions  ADLs/Self Care Home Management;Cryotherapy;Electrical Stimulation;Moist Heat;DME Instruction;Gait Network engineer;Therapeutic activities;Functional mobility training;Therapeutic exercise;Balance training;Neuromuscular re-education;Patient/family education;Orthotic Fit/Training;Passive range of motion;Taping;Aquatic Therapy    PT Next Visit Plan  How is pain with tape?  re-tape.  Continue gentle ROM of neck and shoulders, postural  training and decreasing UE reliance on RW.  gait training.    PT Home Exercise Plan  TBD    Consulted and Agree with Plan of Care  Patient    Family Member Consulted  --       Patient will benefit from skilled therapeutic intervention in order to improve the following deficits and impairments:  Abnormal gait, Decreased balance, Decreased coordination, Decreased strength, Difficulty walking, Increased muscle spasms, Impaired sensation, Pain, Impaired tone, Decreased range of motion  Visit Diagnosis: Muscle weakness (generalized)  Other abnormalities of gait and mobility  Other disturbances of skin sensation  Other symptoms and signs involving the nervous system  Unsteadiness  Paraplegia, incomplete (HCC)  Pain in thoracic spine     Problem List Patient Active Problem List   Diagnosis Date Noted  . S/P lumbar laminectomy 12/27/2017  . Paraplegia (HCC) 11/07/2017  . Quadriplegia, unspecified (HCC) 11/07/2017  . Leukocytosis   . Neuropathic pain   . Drug induced constipation   . Urinary retention   . Class 1 obesity due to excess calories with  serious comorbidity and body mass index (BMI) of 33.0 to 33.9 in adult   . Myelopathy (HCC) 08/01/2017  . Thoracic spinal stenosis 07/31/2017    Dierdre Highman, PT, DPT 01/06/18    1:05 PM    [facility] [address] [place], [place], [address] Phone: [phone]   Fax:  [phone]  Name: MIRANDA GARBER MRN: [id number] Date of Birth: 12/07/1985

## 2018-01-10 ENCOUNTER — Encounter: Payer: Self-pay | Admitting: Physical Therapy

## 2018-01-10 ENCOUNTER — Ambulatory Visit: Payer: 59 | Attending: Physical Medicine & Rehabilitation | Admitting: Physical Therapy

## 2018-01-10 DIAGNOSIS — R2681 Unsteadiness on feet: Secondary | ICD-10-CM | POA: Diagnosis not present

## 2018-01-10 DIAGNOSIS — M6281 Muscle weakness (generalized): Secondary | ICD-10-CM | POA: Diagnosis not present

## 2018-01-10 DIAGNOSIS — M546 Pain in thoracic spine: Secondary | ICD-10-CM | POA: Diagnosis not present

## 2018-01-10 DIAGNOSIS — R2689 Other abnormalities of gait and mobility: Secondary | ICD-10-CM | POA: Diagnosis not present

## 2018-01-10 DIAGNOSIS — R29818 Other symptoms and signs involving the nervous system: Secondary | ICD-10-CM | POA: Diagnosis not present

## 2018-01-10 NOTE — Therapy (Signed)
[facility] [address] [place], [place], [address] Phone: [phone]   Fax:  [phone]  Physical Therapy Treatment  Patient Details  Name: Scott Lane MRN: [id number] Date of Birth: [birth year] Referring Provider (PT): Marikay Alar, MD   Encounter Date: 01/10/2018  PT End of Session - 01/10/18 1116    Visit Number  3    Number of Visits  17    Date for PT Re-Evaluation  03/04/18    Authorization Type  [place] Family Deductible.  VL: [place]    PT Start Time  0800    PT Stop Time  0845    PT Time Calculation (min)  45 min    Equipment Utilized During Treatment  Gait belt    Activity Tolerance  Patient tolerated treatment well    Behavior During Therapy  WFL for tasks assessed/performed       Past Medical History:  Diagnosis Date  . Chicken pox     Past Surgical History:  Procedure Laterality Date  . LUMBAR LAMINECTOMY/DECOMPRESSION MICRODISCECTOMY N/A 08/01/2017   Procedure: Thoracic nine, ten, eleven, twelve laminectomy;  Surgeon: Tia Alert, MD;  Location: [facility] OR;  Service: Neurosurgery;  Laterality: N/A;  . none    . THORACIC DISCECTOMY N/A 12/27/2017   Procedure: Laminectomy and Foraminotomy - Thoracic one - Thoracic three;  Surgeon: Tia Alert, MD;  Location: [facility] OR;  Service: Neurosurgery;  Laterality: N/A;  . WISDOM TOOTH EXTRACTION     bilateral lower.    There were no vitals filed for this visit.  Subjective Assessment - 01/10/18 0803    Subjective  Pt reports he is feeling a lot better and walks occassionally at home without his cane indicating he is working on his balance. Pt reports he wants to focus on walking. Pt reports certain movements trigger pain in his scapular region.     Patient is accompained by:  Family member   Wife T   Pertinent History  Patient enjoys maintaining a very active life style     Limitations  Standing;Walking;Lifting;Other (comment)   soreness in upper back  region causes pain with inability for patient to ambulate with cane   How long can you stand comfortably?  8-10 minutes    How long can you walk comfortably?  uses cane and RW household distance, RW for community distances    Diagnostic tests  MRI     Patient Stated Goals  Ambulation with no AD to get back to prior level of function.     Currently in Pain?  No/denies    Pain Onset  In the past 7 days                       [facility] Adult PT Treatment/Exercise - 01/10/18 1105      Transfers   Transfers  Sit to Stand;Stand to [employer] Transfers    Sit to Stand  6: Modified independent (Device/Increase time)    Stand to Sit  6: Modified independent (Device/Increase time)    Stand Pivot Transfers  6: Modified independent (Device/Increase time)      Ambulation/Gait   Ambulation/Gait  Yes    Ambulation/Gait Assistance  5: Supervision    Ambulation/Gait Assistance Details  Pt performs x1 gait trial using his SPC on level surface for 115 feet with therapist providing multimodal cues to increase L stance duration to promote equal step lengths 2/2 to L step length> R step  length. Pt verbalizes understanding however requires reinforcement for carry over.     Ambulation Distance (Feet)  115 Feet    Assistive device  Straight cane    Gait Pattern  Step-through pattern;Decreased stride length;Decreased hip/knee flexion - right;Decreased hip/knee flexion - left;Decreased weight shift to left;Decreased trunk rotation;Wide base of support;Decreased stance time - left;Decreased step length - right;Decreased arm swing - left    Ambulation Surface  Level;Indoor      Exercises   Exercises  Shoulder;Neck      Neck Exercises: Theraband   Scapula Retraction  10 reps    Scapula Retraction Limitations  Pt performs 1x8 reps of gentle scapular retraction/ protraction standing with UE's on wall to increase scapular stabilization.       Neck Exercises: Standing   Wall Push Ups  5 reps     Other Standing Exercises  Pt performs 1x10 reps of alternating scapular protraction against the wall to increase serratus anterior activation to promote increased stabilization.       Knee/Hip Exercises: Aerobic   Tread Mill  Pt performs treadmill at 1.0 MPH progressing to 1.2 MPH with bilateral UE assist progressing to single UE assist for 7 minute duration. Therapist provided multimodal cues for equal step length, adequate step width, and reciprocal arm swing to optimize gait pattern. Pt demonstrates slight L circumduction during fatigue onset with inability to self correct and L step length> R step length 2/2 to dec L stance duration      Manual Therapy   Manual Therapy  Taping    Kinesiotex  Edema;Inhibit Muscle      Kinesiotix   Edema  in H position: placed left and right lateral to thoracic incision and horizontally across shoulders    Inhibit Muscle   in H position: placed left and right lateral to thoracic incision and horizontally across shoulders             PT Education - 01/10/18 1115    Education provided  Yes    Education Details  Therapist provided education regarding benefits of aerobic exercising at the gym to build up his activity tolerance, continuing to perform daily stretches and HEP at home, and POC moving forward.     Person(s) Educated  Patient    Methods  Explanation    Comprehension  Verbalized understanding       PT Short Term Goals - 01/06/18 1304      PT SHORT TERM GOAL #1   Title  Pt will improve standing balance as indicated by increased BERG balance score to >/= 42/56  (ALL STG due by 02/02/18)    Baseline  9/27: 38/56 indicative of significant fall risk potential     Time  4    Period  Weeks    Status  New      PT SHORT TERM GOAL #2   Title  Pt will participate in FGA assessment to establish fall risk potential with dynamic functional mobility     Baseline  10/30    Time  4    Period  Weeks    Status  Achieved      PT SHORT TERM GOAL #3    Title  Pt will improve gait speed to >/=2.6 ft/sec with LRAD indicating improvement with functional mobility placing him in the community ambulator category     Baseline  9/27: 1.92 ft/sec    Time  4    Period  Weeks    Status  New  PT SHORT TERM GOAL #4   Title  Pt will perform 12 steps with single HR and SPC with reciprocal stepping pattern and supervision-min A indicating improvement with functional mobility and LE strength     Time  4    Period  Weeks    Status  New      PT SHORT TERM GOAL #5   Title  Pt will be able to ambulate 500 ft in the community over uneven surfaces, grass, ramps, and curbs with supervision using LRAD for improvement in community ambulation distances    Baseline  Currently ambulating with RW     Time  4    Period  Weeks    Status  New        PT Long Term Goals - 01/06/18 1304      PT LONG TERM GOAL #1   Title  Pt will be independent with balance and LE strengthening HEP to further improve functional independence (ALL LTG due by 03/04/18)    Time  8    Period  Weeks    Status  New      PT LONG TERM GOAL #2   Title  Pt will improve standing balance and BERG >/= 50/56 to decrease falls risk    Baseline  9/27 eval: 38/56    Time  8    Period  Weeks    Status  New      PT LONG TERM GOAL #3   Title  Pt will improve gait velocity to >/= 3.0 ft/sec with LRAD indicating improvement in functional mobility     Baseline  9/27: 1.92 ft/sec    Time  8    Period  Weeks    Status  New      PT LONG TERM GOAL #4   Title  Pt will ambulate x 1000 over outdoor surfaces + curb (paved and grassy) MOD I with LRAD to improve functional mobility with community distances    Time  8    Period  Weeks    Status  New      PT LONG TERM GOAL #5   Title  Pt will negotiate 12 steps with alternating stepping MOD I using single HR and SPC to improve functional mobility     Time  8    Period  Weeks    Status  New      PT LONG TERM GOAL #6   Title  Pt will  demonstrate an FGA score of >/=19/30 to decrease fall risk potential with functional mobility using LRAD     Baseline  10/30    Time  8    Period  Weeks    Status  New            Plan - 01/10/18 1117    Clinical Impression Statement  Today's treatment session focused on re applying Kinesiotape on either side of incision to promote blood flow, edema management, and decrease pain. Pt performed scapular protraction against wall for support to improve scapular stabilization. Pt ambulates on treadmill at 1.0-1.2 MPH working on optimizing gait sequencing with decreasing UE assist. Pt reports fatigue onset at 7 minute mark requiring standing rest break before overground walking with SPC. Pt will continue to benefit from skilled PT to address strength and balance deficts and improve functional independence.     Rehab Potential  Good    Clinical Impairments Affecting Rehab Potential  Pt currently reports pain and tightness to mid thoracic region near  surgical incision which is limiting his functional mobility     PT Frequency  2x / week    PT Duration  8 weeks    PT Treatment/Interventions  ADLs/Self Care Home Management;Cryotherapy;Electrical Stimulation;Moist Heat;DME Instruction;Gait Network engineer;Therapeutic activities;Functional mobility training;Therapeutic exercise;Balance training;Neuromuscular re-education;Patient/family education;Orthotic Fit/Training;Passive range of motion;Taping;Aquatic Therapy    PT Next Visit Plan  How is pain with tape?  re-tape. gait training with SPC and balance without AD, step length on ramp promoting increased L LE weight bearing, postural training,  Continue gentle ROM of neck and shoulders, aerobic exercise, core strengthening.     PT Home Exercise Plan  TBD    Consulted and Agree with Plan of Care  Patient       Patient will benefit from skilled therapeutic intervention in order to improve the following deficits and impairments:  Abnormal gait,  Decreased balance, Decreased coordination, Decreased strength, Difficulty walking, Increased muscle spasms, Impaired sensation, Pain, Impaired tone, Decreased range of motion  Visit Diagnosis: Muscle weakness (generalized)  Other abnormalities of gait and mobility  Pain in thoracic spine  Unsteadiness     Problem List Patient Active Problem List   Diagnosis Date Noted  . S/P lumbar laminectomy 12/27/2017  . Paraplegia (HCC) 11/07/2017  . Quadriplegia, unspecified (HCC) 11/07/2017  . Leukocytosis   . Neuropathic pain   . Drug induced constipation   . Urinary retention   . Class 1 obesity due to excess calories with serious comorbidity and body mass index (BMI) of 33.0 to 33.9 in adult   . Myelopathy (HCC) 08/01/2017  . Thoracic spinal stenosis 07/31/2017    Carney Living, SPT 01/10/2018, 11:22 AM  [facility] [address] [place], [place], [address] Phone: [phone]   Fax:  [phone]  Name: REED DADY MRN: [id number] Date of Birth: [birth year]

## 2018-01-13 ENCOUNTER — Encounter: Payer: Self-pay | Admitting: Physical Therapy

## 2018-01-13 ENCOUNTER — Ambulatory Visit: Payer: 59 | Admitting: Physical Therapy

## 2018-01-13 DIAGNOSIS — M6281 Muscle weakness (generalized): Secondary | ICD-10-CM

## 2018-01-13 DIAGNOSIS — R2681 Unsteadiness on feet: Secondary | ICD-10-CM | POA: Diagnosis not present

## 2018-01-13 DIAGNOSIS — R2689 Other abnormalities of gait and mobility: Secondary | ICD-10-CM

## 2018-01-13 DIAGNOSIS — R29818 Other symptoms and signs involving the nervous system: Secondary | ICD-10-CM | POA: Diagnosis not present

## 2018-01-13 DIAGNOSIS — M546 Pain in thoracic spine: Secondary | ICD-10-CM | POA: Diagnosis not present

## 2018-01-13 NOTE — Therapy (Signed)
[facility] [address] [place], [place], [address] Phone: [phone]   Fax:  [phone]  Physical Therapy Treatment  Patient Details  Name: Scott Lane MRN: [id number] Date of Birth: 03-27-1986 Referring Provider (PT): Marikay Alar, MD   Encounter Date: 01/13/2018  PT End of Session - 01/13/18 1928    Visit Number  4    Number of Visits  17    Date for PT Re-Evaluation  03/04/18    Authorization Type  [place] Family Deductible.  VL: [place]    PT Start Time  0805    PT Stop Time  0847    PT Time Calculation (min)  42 min       Past Medical History:  Diagnosis Date  . Chicken pox     Past Surgical History:  Procedure Laterality Date  . LUMBAR LAMINECTOMY/DECOMPRESSION MICRODISCECTOMY N/A 08/01/2017   Procedure: Thoracic nine, ten, eleven, twelve laminectomy;  Surgeon: Tia Alert, MD;  Location: [facility] OR;  Service: Neurosurgery;  Laterality: N/A;  . none    . THORACIC DISCECTOMY N/A 12/27/2017   Procedure: Laminectomy and Foraminotomy - Thoracic one - Thoracic three;  Surgeon: Tia Alert, MD;  Location: [facility] OR;  Service: Neurosurgery;  Laterality: N/A;  . WISDOM TOOTH EXTRACTION     bilateral lower.    There were no vitals filed for this visit.  Subjective Assessment - 01/13/18 1906    Subjective  Pt states the pain in his right scapular region is improving - reports the tape helped, but he took it off because he has a doctor's appt today after PT; wants to return to the pool for aquatic therapy; pt using [facility] for assistance with ambulation    Patient Stated Goals  Ambulation with no AD to get back to prior level of function.     Currently in Pain?  Yes    Pain Score  2     Pain Location  Back    Pain Orientation  Right;Mid;Upper    Pain Descriptors / Indicators  Tightness;Discomfort    Pain Type  Surgical pain    Pain Onset  1 to 4 weeks ago    Pain Frequency  Intermittent                        [facility] Adult PT Treatment/Exercise - 01/13/18 0824      Ambulation/Gait   Ambulation/Gait  Yes    Ambulation/Gait Assistance  5: Supervision    Ambulation/Gait Assistance Details  cues to increase weight shift onto LLE     Ambulation Distance (Feet)  100 Feet    Assistive device  Straight cane    Gait Pattern  Step-through pattern;Decreased stride length;Decreased hip/knee flexion - right;Decreased hip/knee flexion - left;Decreased weight shift to left;Decreased trunk rotation;Wide base of support;Decreased stance time - left;Decreased step length - right;Decreased arm swing - left    Ambulation Surface  Level;Indoor      Exercises   Exercises  Shoulder    Other Exercises   Pt performed scapula retraction with elbows flexed at 90 degrees - 5 reps; horizontal abduction 5 reps; stretch for Rt upper back and trapezius in which pt horizonntally adducted RUE and applied overpressure with LUE - 10 sec hold x 3 reps       Knee/Hip Exercises: Stretches   Active Hamstring Stretch  Left;5 reps;3 reps;30 seconds   with contract/relax with pt in supine  Quad Stretch  Both;2 reps;20 seconds   in tall kneeling - sitting back on heels    Gastroc Stretch  Both;2 reps;30 seconds   heels off edge of step with bil. UE support   Other Knee/Hip Stretches  Pt performed single knee to chest RLE x 15 sec hold x 2 reps:       Knee/Hip Exercises: Machines for Strengthening   Cybex Leg Press  bil. LE's 80# 15 reps:  LLE only 40# 2 sets 10 reps       Knee/Hip Exercises: Standing   Heel Raises  Both;1 set;10 reps    Forward Step Up  Left;1 set;10 reps;Hand Hold: 2;Step Height: 4"    Step Down  Left;1 set;10 reps;Hand Hold: 2;Step Height: 4"    Other Standing Knee Exercises  unilateral heel raise LLE only 10 reps with bil. UE support      Knee/Hip Exercises: Prone   Hamstring Curl  1 set;10 reps;2 seconds   LLE in standing      NeuroRe-ed: Tall kneeling - blue  physioball in front for support; moving RLE up/back and laterally 5 reps each for increased weight shift onto LLE  Sitting back on heels for quad stretching - 20 sec hold x 2 reps   Balance Exercises - 01/13/18 1923      Balance Exercises: Standing   Other Standing Exercises  alternate stepping over and back of balance beam 5 reps each leg for improved SLS - with UE support       Tap ups to 1st step 5 reps each with minimal UE support on each rail  2# weight on LLE - tapping 2nd step 10 reps with UE support; tapping 3rd step (weight removed) with LLE for increased balance and  For Lt hip flexor strengthening     PT Short Term Goals - 01/06/18 1304      PT SHORT TERM GOAL #1   Title  Pt will improve standing balance as indicated by increased BERG balance score to >/= 42/56  (ALL STG due by 02/02/18)    Baseline  9/27: 38/56 indicative of significant fall risk potential     Time  4    Period  Weeks    Status  New      PT SHORT TERM GOAL #2   Title  Pt will participate in FGA assessment to establish fall risk potential with dynamic functional mobility     Baseline  10/30    Time  4    Period  Weeks    Status  Achieved      PT SHORT TERM GOAL #3   Title  Pt will improve gait speed to >/=2.6 ft/sec with LRAD indicating improvement with functional mobility placing him in the community ambulator category     Baseline  9/27: 1.92 ft/sec    Time  4    Period  Weeks    Status  New      PT SHORT TERM GOAL #4   Title  Pt will perform 12 steps with single HR and SPC with reciprocal stepping pattern and supervision-min A indicating improvement with functional mobility and LE strength     Time  4    Period  Weeks    Status  New      PT SHORT TERM GOAL #5   Title  Pt will be able to ambulate 500 ft in the community over uneven surfaces, grass, ramps, and curbs with supervision using LRAD for improvement in  community ambulation distances    Baseline  Currently ambulating with RW      Time  4    Period  Weeks    Status  New        PT Long Term Goals - 01/06/18 1304      PT LONG TERM GOAL #1   Title  Pt will be independent with balance and LE strengthening HEP to further improve functional independence (ALL LTG due by 03/04/18)    Time  8    Period  Weeks    Status  New      PT LONG TERM GOAL #2   Title  Pt will improve standing balance and BERG >/= 50/56 to decrease falls risk    Baseline  9/27 eval: 38/56    Time  8    Period  Weeks    Status  New      PT LONG TERM GOAL #3   Title  Pt will improve gait velocity to >/= 3.0 ft/sec with LRAD indicating improvement in functional mobility     Baseline  9/27: 1.92 ft/sec    Time  8    Period  Weeks    Status  New      PT LONG TERM GOAL #4   Title  Pt will ambulate x 1000 over outdoor surfaces + curb (paved and grassy) MOD I with LRAD to improve functional mobility with community distances    Time  8    Period  Weeks    Status  New      PT LONG TERM GOAL #5   Title  Pt will negotiate 12 steps with alternating stepping MOD I using single HR and SPC to improve functional mobility     Time  8    Period  Weeks    Status  New      PT LONG TERM GOAL #6   Title  Pt will demonstrate an FGA score of >/=19/30 to decrease fall risk potential with functional mobility using LRAD     Baseline  10/30    Time  8    Period  Weeks    Status  New            Plan - 01/13/18 1928    Clinical Impression Statement  Treatment session focused on strengthening exercises for LLE; pt continues to have Lt quad weakness with pt requiring bil. UE support for concentric strengthening with step up exercise and also for eccentric strengthening with step down exercise.  Pt continues to have significant Lt plantarflexor weakness with decreased push off noted in stance phase of gait.  Pt has decreased muscle endurance of LLE (hip and knee musc.) as pt fatigues quickly after approx. 8-10 reps.  Pt declined re-application of  kinesiotape today due to MD visit scheduled after PT appt.  Rehab Potential  Good    Clinical Impairments Affecting Rehab Potential  Pt currently reports pain and tightness to mid thoracic region near surgical incision which is limiting his functional mobility     PT Frequency  2x / week    PT Duration  8 weeks    PT Treatment/Interventions  ADLs/Self Care Home Management;Cryotherapy;Electrical Stimulation;Moist Heat;DME Instruction;Gait Network engineer;Therapeutic activities;Functional mobility training;Therapeutic exercise;Balance training;Neuromuscular re-education;Patient/family education;Orthotic Fit/Training;Passive range of motion;Taping;Aquatic Therapy    PT Next Visit Plan  re-tape?:   gait training with SPC and balance without AD, step length on ramp promoting increased L LE weight bearing, postural training,  Continue gentle ROM of neck and shoulders, aerobic exercise, core strengthening.     PT Home Exercise Plan  TBD    Consulted and Agree with Plan of Care  Patient       Patient will benefit from skilled therapeutic intervention in order to improve the following deficits and impairments:  Abnormal gait, Decreased balance, Decreased coordination, Decreased strength, Difficulty walking, Increased muscle spasms, Impaired sensation, Pain, Impaired tone, Decreased range of motion  Visit Diagnosis: Muscle weakness (generalized)  Other abnormalities of gait and mobility  Unsteadiness     Problem List Patient Active Problem List   Diagnosis Date Noted  . S/P lumbar laminectomy 12/27/2017  . Paraplegia (HCC) 11/07/2017  . Quadriplegia, unspecified (HCC) 11/07/2017  . Leukocytosis   . Neuropathic pain   . Drug induced constipation   . Urinary retention   . Class 1 obesity due to excess calories with serious comorbidity and body mass index (BMI) of  33.0 to 33.9 in adult   . Myelopathy (HCC) 08/01/2017  . Thoracic spinal stenosis 07/31/2017    Kary Kos, PT 01/13/2018, 7:40 PM  [facility] [address] [place], [place], [address] Phone: [phone]   Fax:  [phone]  Name: Scott Lane MRN: [id number] Date of Birth: 1985/09/12

## 2018-01-17 ENCOUNTER — Ambulatory Visit: Payer: 59 | Admitting: Physical Therapy

## 2018-01-17 ENCOUNTER — Encounter: Payer: Self-pay | Admitting: Physical Therapy

## 2018-01-17 DIAGNOSIS — R2681 Unsteadiness on feet: Secondary | ICD-10-CM | POA: Diagnosis not present

## 2018-01-17 DIAGNOSIS — R2689 Other abnormalities of gait and mobility: Secondary | ICD-10-CM | POA: Diagnosis not present

## 2018-01-17 DIAGNOSIS — M546 Pain in thoracic spine: Secondary | ICD-10-CM | POA: Diagnosis not present

## 2018-01-17 DIAGNOSIS — M6281 Muscle weakness (generalized): Secondary | ICD-10-CM | POA: Diagnosis not present

## 2018-01-17 DIAGNOSIS — R29818 Other symptoms and signs involving the nervous system: Secondary | ICD-10-CM | POA: Diagnosis not present

## 2018-01-17 NOTE — Patient Instructions (Signed)
Wall Slide With Cervical Neutral / Upward Scapular Rotation: Standing    Elbows bent, little fingers on wall, thumbs out. Slide up forming a Y.  Keep head and pelvis aligned. Slide only as far as possible without pain then gently lift arms off wall slightly and perform scapular retraction. Lower arms back down and repeat.  Complete 2x5 reps   http://ss.exer.us/224   Copyright  VHI. All rights reserved.

## 2018-01-17 NOTE — Therapy (Signed)
[facility] [address] [place], [place], [address] Phone: [phone]   Fax:  [phone]  Physical Therapy Treatment  Patient Details  Name: Scott Lane MRN: [id number] Date of Birth: 03/08/1986 Referring Provider (PT): Marikay Alar, MD   Encounter Date: 01/17/2018  PT End of Session - 01/17/18 1002    Visit Number  5    Number of Visits  17    Date for PT Re-Evaluation  03/04/18    Authorization Type  [place] Family Deductible.  VL: MN    PT Start Time  0801    PT Stop Time  0845    PT Time Calculation (min)  44 min    Equipment Utilized During Treatment  Gait belt    Activity Tolerance  Patient tolerated treatment well    Behavior During Therapy  WFL for tasks assessed/performed       Past Medical History:  Diagnosis Date  . Chicken pox     Past Surgical History:  Procedure Laterality Date  . LUMBAR LAMINECTOMY/DECOMPRESSION MICRODISCECTOMY N/A 08/01/2017   Procedure: Thoracic nine, ten, eleven, twelve laminectomy;  Surgeon: Tia Alert, MD;  Location: [facility] OR;  Service: Neurosurgery;  Laterality: N/A;  . none    . THORACIC DISCECTOMY N/A 12/27/2017   Procedure: Laminectomy and Foraminotomy - Thoracic one - Thoracic three;  Surgeon: Tia Alert, MD;  Location: [facility] OR;  Service: Neurosurgery;  Laterality: N/A;  . WISDOM TOOTH EXTRACTION     bilateral lower.    There were no vitals filed for this visit.  Subjective Assessment - 01/17/18 0825    Subjective  Pt reports he is ambulating better using his SPC and continuing to feel stiffness in thoracic region. Reports his exercises are going well at home.     Patient is accompained by:  Family member   Wife T   Pertinent History  Patient enjoys maintaining a very active life style     Limitations  Standing;Walking;Lifting;Other (comment)   soreness in upper back region causes pain with inability for patient to ambulate with cane   How long can you  stand comfortably?  8-10 minutes    How long can you walk comfortably?  uses cane and RW household distance, RW for community distances    Diagnostic tests  MRI     Patient Stated Goals  Ambulation with no AD to get back to prior level of function.     Currently in Pain?  No/denies                       [facility] Adult PT Treatment/Exercise - 01/17/18 0952      Transfers   Transfers  Sit to Stand;Stand to Sit;Stand Pivot Transfers    Sit to Stand  6: Modified independent (Device/Increase time)    Stand to Sit  6: Modified independent (Device/Increase time)    Stand Pivot Transfers  6: Modified independent (Device/Increase time)      Exercises   Exercises  Shoulder      Knee/Hip Exercises: Stretches   Active Hamstring Stretch  Left;5 reps;3 reps;30 seconds      Knee/Hip Exercises: Standing   Other Standing Knee Exercises  Pt performs 1x10 reps of eccentric quads from 4 inch step with R LE. Pt attempts to perform with L LE but demonstrates observable reliance on UE's on // bars for assistance. Pt attempts to perform on 2 inch step; however, reports fear of L  knee buckling with inability to confidently flex L knee in closed chain during exercise. Therapist attempts to have patient stand on 2 in block with R LE elevated off ground with patient working on slight knee bends with controlled knee extension. Therapist provided tactile feedback for proper posture and technique. Pt then performs 1x10 squats with finger tip support on // bars and R LE on foam pad to improve L LE WB. Therapist provided manual facilitation at pelvis to initiate L sided weight shift.      Shoulder Exercises: Standing   Other Standing Exercises  Pt performed 2x5 reps of scapular Y wall exercise with 2 second lift off at the top for increased scapular reaction activation. Therapist provided multimodal cues to improve posture and technique with exercise performance      Manual Therapy   Manual Therapy  Taping     Kinesiotex  Edema;Inhibit Muscle      Kinesiotix   Edema  in H position: placed left and right lateral to thoracic incision and horizontally across shoulders    Inhibit Muscle   in H position: placed left and right lateral to thoracic incision and horizontally across shoulders             PT Education - 01/17/18 1001    Education provided  Yes    Education Details  Therapist provided education regarding continuation of HEP exercises at home with incorporation of Y scapular wall exercise with patient verbalizing and demonstrating understanding.     Person(s) Educated  Patient    Methods  Explanation    Comprehension  Verbalized understanding;Returned demonstration       PT Short Term Goals - 01/06/18 1304      PT SHORT TERM GOAL #1   Title  Pt will improve standing balance as indicated by increased BERG balance score to >/= 42/56  (ALL STG due by 02/02/18)    Baseline  9/27: 38/56 indicative of significant fall risk potential     Time  4    Period  Weeks    Status  New      PT SHORT TERM GOAL #2   Title  Pt will participate in FGA assessment to establish fall risk potential with dynamic functional mobility     Baseline  10/30    Time  4    Period  Weeks    Status  Achieved      PT SHORT TERM GOAL #3   Title  Pt will improve gait speed to >/=2.6 ft/sec with LRAD indicating improvement with functional mobility placing him in the community ambulator category     Baseline  9/27: 1.92 ft/sec    Time  4    Period  Weeks    Status  New      PT SHORT TERM GOAL #4   Title  Pt will perform 12 steps with single HR and SPC with reciprocal stepping pattern and supervision-min A indicating improvement with functional mobility and LE strength     Time  4    Period  Weeks    Status  New      PT SHORT TERM GOAL #5   Title  Pt will be able to ambulate 500 ft in the community over uneven surfaces, grass, ramps, and curbs with supervision using LRAD for improvement in community  ambulation distances    Baseline  Currently ambulating with RW     Time  4    Period  Weeks    Status  New        PT Long Term Goals - 01/06/18 1304      PT LONG TERM GOAL #1   Title  Pt will be independent with balance and LE strengthening HEP to further improve functional independence (ALL LTG due by 03/04/18)    Time  8    Period  Weeks    Status  New      PT LONG TERM GOAL #2   Title  Pt will improve standing balance and BERG >/= 50/56 to decrease falls risk    Baseline  9/27 eval: 38/56    Time  8    Period  Weeks    Status  New      PT LONG TERM GOAL #3   Title  Pt will improve gait velocity to >/= 3.0 ft/sec with LRAD indicating improvement in functional mobility     Baseline  9/27: 1.92 ft/sec    Time  8    Period  Weeks    Status  New      PT LONG TERM GOAL #4   Title  Pt will ambulate x 1000 over outdoor surfaces + curb (paved and grassy) MOD I with LRAD to improve functional mobility with community distances    Time  8    Period  Weeks    Status  New      PT LONG TERM GOAL #5   Title  Pt will negotiate 12 steps with alternating stepping MOD I using single HR and SPC to improve functional mobility     Time  8    Period  Weeks    Status  New      PT LONG TERM GOAL #6   Title  Pt will demonstrate an FGA score of >/=19/30 to decrease fall risk potential with functional mobility using LRAD     Baseline  10/30    Time  8    Period  Weeks    Status  New            Plan - 01/17/18 1002    Clinical Impression Statement  Today's skilled treatment session focused on scapular strengthening and LE strengthening with focus on L LE loading in closed chain. Pt tolerated session well and continues to demonstrate increased challenge with L LE loading in closed chain 2/2 to weakness and fear of buckling episodes. Pt demonstrates ability to perform exercises with correct technique with therapist providing constant multimodal cues and tactile faciliation due to patient  relying heavily on UE assist to perform LE strengthening. Pt will continue to benefit from skilled PT to address strength and balance deficits to improve independence with functional mobility.     Rehab Potential  Good    Clinical Impairments Affecting Rehab Potential  Pt currently reports pain and tightness to mid thoracic region near surgical incision which is limiting his functional mobility     PT Frequency  2x / week    PT Duration  8 weeks    PT Treatment/Interventions  ADLs/Self Care Home Management;Cryotherapy;Electrical Stimulation;Moist Heat;DME Instruction;Gait Network engineer;Therapeutic activities;Functional mobility training;Therapeutic exercise;Balance training;Neuromuscular re-education;Patient/family education;Orthotic Fit/Training;Passive range of motion;Taping;Aquatic Therapy    PT Next Visit Plan  re-tape?:   gait training with SPC and balance without AD, step length on ramp promoting increased L LE weight bearing, postural training,  Continue gentle ROM of neck and shoulders, aerobic exercise, core strengthening.     PT Home Exercise Plan  TBD    Consulted and Agree  with Plan of Care  Patient       Patient will benefit from skilled therapeutic intervention in order to improve the following deficits and impairments:  Abnormal gait, Decreased balance, Decreased coordination, Decreased strength, Difficulty walking, Increased muscle spasms, Impaired sensation, Pain, Impaired tone, Decreased range of motion  Visit Diagnosis: Muscle weakness (generalized)  Other abnormalities of gait and mobility  Unsteadiness     Problem List Patient Active Problem List   Diagnosis Date Noted  . S/P lumbar laminectomy 12/27/2017  . Paraplegia (HCC) 11/07/2017  . Quadriplegia, unspecified (HCC) 11/07/2017  . Leukocytosis   . Neuropathic pain   . Drug induced constipation   . Urinary retention   . Class 1 obesity due to excess calories with serious comorbidity and body mass  index (BMI) of 33.0 to 33.9 in adult   . Myelopathy (HCC) 08/01/2017  . Thoracic spinal stenosis 07/31/2017    Carney Living, SPT 01/17/2018, 10:06 AM  [facility] [address] [place], [place], [address] Phone: [phone]   Fax:  [phone]  Name: HERSEL MCMEEN MRN: [id number] Date of Birth: [birth year]

## 2018-01-20 ENCOUNTER — Encounter

## 2018-01-20 ENCOUNTER — Encounter: Payer: Self-pay | Admitting: Rehabilitation

## 2018-01-20 ENCOUNTER — Ambulatory Visit: Payer: 59 | Admitting: Rehabilitation

## 2018-01-20 ENCOUNTER — Telehealth: Payer: Self-pay | Admitting: Neurology

## 2018-01-20 DIAGNOSIS — R29818 Other symptoms and signs involving the nervous system: Secondary | ICD-10-CM | POA: Diagnosis not present

## 2018-01-20 DIAGNOSIS — R2689 Other abnormalities of gait and mobility: Secondary | ICD-10-CM

## 2018-01-20 DIAGNOSIS — M546 Pain in thoracic spine: Secondary | ICD-10-CM | POA: Diagnosis not present

## 2018-01-20 DIAGNOSIS — R2681 Unsteadiness on feet: Secondary | ICD-10-CM

## 2018-01-20 DIAGNOSIS — M6281 Muscle weakness (generalized): Secondary | ICD-10-CM | POA: Diagnosis not present

## 2018-01-20 NOTE — Therapy (Signed)
[facility] [address] [place], [place], [address] Phone: [phone]   Fax:  [phone]  Physical Therapy Treatment  Patient Details  Name: Scott Lane MRN: [id number] Date of Birth: Oct 14, 1985 Referring Provider (PT): Marikay Alar, MD   Encounter Date: 01/20/2018  PT End of Session - 01/20/18 0853    Visit Number  6    Number of Visits  17    Date for PT Re-Evaluation  03/04/18    Authorization Type  [employer] Deductible.  VL: MN    PT Start Time  0849    PT Stop Time  0931    PT Time Calculation (min)  42 min    Equipment Utilized During Treatment  Gait belt    Activity Tolerance  Patient tolerated treatment well    Behavior During Therapy  WFL for tasks assessed/performed       Past Medical History:  Diagnosis Date  . Chicken pox     Past Surgical History:  Procedure Laterality Date  . LUMBAR LAMINECTOMY/DECOMPRESSION MICRODISCECTOMY N/A 08/01/2017   Procedure: Thoracic nine, ten, eleven, twelve laminectomy;  Surgeon: Tia Alert, MD;  Location: [facility] OR;  Service: Neurosurgery;  Laterality: N/A;  . none    . THORACIC DISCECTOMY N/A 12/27/2017   Procedure: Laminectomy and Foraminotomy - Thoracic one - Thoracic three;  Surgeon: Tia Alert, MD;  Location: [facility] OR;  Service: Neurosurgery;  Laterality: N/A;  . WISDOM TOOTH EXTRACTION     bilateral lower.    There were no vitals filed for this visit.  Subjective Assessment - 01/20/18 0852    Subjective  Pt reports wanting to be taped again today. Brought his own tape.      Pertinent History  Patient enjoys maintaining a very active life style     Limitations  Standing;Walking;Lifting;Other (comment)    How long can you stand comfortably?  8-10 minutes    How long can you walk comfortably?  uses cane and RW household distance, RW for community distances    Diagnostic tests  MRI     Patient Stated Goals  Ambulation with no AD to get back to  prior level of function.     Currently in Pain?  No/denies                       [facility] Adult PT Treatment/Exercise - 01/20/18 0850      Transfers   Transfers  Sit to Stand;Stand to Sit;Stand Pivot Transfers    Comments  Performed sit<>stand in varying ways in order to improve LLE weight bearing/strengthening.  Performed first from standard arm chair x 5 reps without UE support.  Note R lateral weight shift despite cues.  Therefore progressed to having RLE placed slightly anterior to LLE in order increase demand on LLE.  Pt able to perform x 5 reps with min A with cues for increased foward flexion for controlled descent.  Ended session with RLE placed on balance bubble performing sit<>stand x 2 reps with hands on lap for support and min A to complete task.        Ambulation/Gait   Ambulation/Gait  Yes    Ambulation/Gait Assistance  4: Min guard    Ambulation/Gait Assistance Details  Performed short bouts of gait during session without cane.  Requires min/guard A with verbal and tactile cues to reduce lateral trunk flexion when in L stance phase of gait.     Ambulation Distance (  Feet)  20 Feet   x 2 reps, 10'    Assistive device  None    Gait Pattern  Step-through pattern;Decreased stride length;Decreased hip/knee flexion - right;Decreased hip/knee flexion - left;Decreased weight shift to left;Decreased trunk rotation;Wide base of support;Decreased stance time - left;Decreased step length - right;Decreased arm swing - left    Ambulation Surface  Level;Indoor      Neuro Re-ed    Neuro Re-ed Details   Tapping balance bubble with RLE to address LLE SLS x 10 reps with intermittent BUE>single UE support.  Single instance of L knee buckle but able to self recover.        Exercises   Exercises  Other Exercises    Other Exercises   Performed step downs from 4" step with BUE support x 5 reps.  Note heavy reliance on UEs causing forward flexed posture, therefore switched to 2" step with  improvement in technique noted and less reliance on UEs and pt able to use single UE x 10 reps total.   Also provided cues for improved LLE power when performing concentric portion of step up.  Following step ups pt reporting increased tightness in L quad, therefore he attempted to stretch in standing, however unable to grasp L foot safely, therefore had pt perform prone quad stretch (strap wasn't long enough therefore PT assisted) x 30 secs progressing into contract relax x 2 sets of 5 sec holds with increased ROM into passive stretch.  Also had pt perform childs pose x 30 secs.  Pt reports relief therefore continued with R half kneeling tasks; transitions from tall kneeling to R half kneeling x 3 reps, R half kneeling elevating LUE x 5 reps, then elevating BUE x 5 reps with min A and tactile cues at L pelvis for improved stance control.  Pt moderately fatigued following tall kneel/half kneel tasks.        Knee/Hip Exercises: Aerobic   Elliptical  Level 1.5 with BUE support x 2 mins with cues for posture and improved control in LLE.  Pt moderately fatigued following task.              PT Education - 01/20/18 0853    Education provided  Yes    Education Details  importance of stretching quadricep muscle throughout the day.     Person(s) Educated  Patient    Methods  Explanation    Comprehension  Verbalized understanding       PT Short Term Goals - 01/06/18 1304      PT SHORT TERM GOAL #1   Title  Pt will improve standing balance as indicated by increased BERG balance score to >/= 42/56  (ALL STG due by 02/02/18)    Baseline  9/27: 38/56 indicative of significant fall risk potential     Time  4    Period  Weeks    Status  New      PT SHORT TERM GOAL #2   Title  Pt will participate in FGA assessment to establish fall risk potential with dynamic functional mobility     Baseline  10/30    Time  4    Period  Weeks    Status  Achieved      PT SHORT TERM GOAL #3   Title  Pt will  improve gait speed to >/=2.6 ft/sec with LRAD indicating improvement with functional mobility placing him in the community ambulator category     Baseline  9/27: 1.92 ft/sec  Time  4    Period  Weeks    Status  New      PT SHORT TERM GOAL #4   Title  Pt will perform 12 steps with single HR and SPC with reciprocal stepping pattern and supervision-min A indicating improvement with functional mobility and LE strength     Time  4    Period  Weeks    Status  New      PT SHORT TERM GOAL #5   Title  Pt will be able to ambulate 500 ft in the community over uneven surfaces, grass, ramps, and curbs with supervision using LRAD for improvement in community ambulation distances    Baseline  Currently ambulating with RW     Time  4    Period  Weeks    Status  New        PT Long Term Goals - 01/06/18 1304      PT LONG TERM GOAL #1   Title  Pt will be independent with balance and LE strengthening HEP to further improve functional independence (ALL LTG due by 03/04/18)    Time  8    Period  Weeks    Status  New      PT LONG TERM GOAL #2   Title  Pt will improve standing balance and BERG >/= 50/56 to decrease falls risk    Baseline  9/27 eval: 38/56    Time  8    Period  Weeks    Status  New      PT LONG TERM GOAL #3   Title  Pt will improve gait velocity to >/= 3.0 ft/sec with LRAD indicating improvement in functional mobility     Baseline  9/27: 1.92 ft/sec    Time  8    Period  Weeks    Status  New      PT LONG TERM GOAL #4   Title  Pt will ambulate x 1000 over outdoor surfaces + curb (paved and grassy) MOD I with LRAD to improve functional mobility with community distances    Time  8    Period  Weeks    Status  New      PT LONG TERM GOAL #5   Title  Pt will negotiate 12 steps with alternating stepping MOD I using single HR and SPC to improve functional mobility     Time  8    Period  Weeks    Status  New      PT LONG TERM GOAL #6   Title  Pt will demonstrate an FGA score  of >/=19/30 to decrease fall risk potential with functional mobility using LRAD     Baseline  10/30    Time  8    Period  Weeks    Status  New            Plan - 01/20/18 0853    Clinical Impression Statement  Skilled session continues to focus on LLE strengthening in closed chain activity.  Pt continues to demonstrate more fluid movement in LLE, however has marked weakness that continues to limit pts safety with walking without device.  Also continues to demonstrate moderate fatigue following more challenging exercises.  Pt will continue to benefit from skilled PT to work towards goals.      Rehab Potential  Good    Clinical Impairments Affecting Rehab Potential  Pt currently reports pain and tightness to mid thoracic region near surgical incision which is  limiting his functional mobility     PT Frequency  2x / week    PT Duration  8 weeks    PT Treatment/Interventions  ADLs/Self Care Home Management;Cryotherapy;Electrical Stimulation;Moist Heat;DME Instruction;Gait Network engineer;Therapeutic activities;Functional mobility training;Therapeutic exercise;Balance training;Neuromuscular re-education;Patient/family education;Orthotic Fit/Training;Passive range of motion;Taping;Aquatic Therapy    PT Next Visit Plan  re-tape as needed:   gait training with SPC and balance without AD, step length on ramp promoting increased L LE weight bearing, postural training,  Continue gentle ROM of neck and shoulders, aerobic exercise, core strengthening.     PT Home Exercise Plan  TBD    Consulted and Agree with Plan of Care  Patient       Patient will benefit from skilled therapeutic intervention in order to improve the following deficits and impairments:  Abnormal gait, Decreased balance, Decreased coordination, Decreased strength, Difficulty walking, Increased muscle spasms, Impaired sensation, Pain, Impaired tone, Decreased range of motion  Visit Diagnosis: Muscle weakness  (generalized)  Other abnormalities of gait and mobility  Unsteadiness     Problem List Patient Active Problem List   Diagnosis Date Noted  . S/P lumbar laminectomy 12/27/2017  . Paraplegia (HCC) 11/07/2017  . Quadriplegia, unspecified (HCC) 11/07/2017  . Leukocytosis   . Neuropathic pain   . Drug induced constipation   . Urinary retention   . Class 1 obesity due to excess calories with serious comorbidity and body mass index (BMI) of 33.0 to 33.9 in adult   . Myelopathy (HCC) 08/01/2017  . Thoracic spinal stenosis 07/31/2017    Harriet Butte, PT, MPT [facility] [address] [place], [place], [address] Phone: [phone]   Fax:  [phone] 01/20/18, 9:52 AM  Name: Scott Lane MRN: [id number] Date of Birth: 06-02-1985

## 2018-01-20 NOTE — Telephone Encounter (Signed)
We can offer 03/11/18 at 11:00am

## 2018-01-20 NOTE — Telephone Encounter (Signed)
Patient was on the schedule for 11/27. He states that Dr. Terrace Arabia wanted him to be taken off the schedule and see how he felt after his back surgery, he says that he was told if he wanted to continue to have injections he could get his original apt date back. Dr. Terrace Arabia is out of office that day and first available is now the end of December. Please advise.

## 2018-01-22 NOTE — Telephone Encounter (Signed)
I called and offered the patient this date he was ok with it. I scheduled it.Marland Kitchen

## 2018-01-23 ENCOUNTER — Encounter: Payer: Self-pay | Admitting: Rehabilitation

## 2018-01-23 ENCOUNTER — Ambulatory Visit: Payer: 59 | Admitting: Rehabilitation

## 2018-01-23 DIAGNOSIS — M546 Pain in thoracic spine: Secondary | ICD-10-CM | POA: Diagnosis not present

## 2018-01-23 DIAGNOSIS — M6281 Muscle weakness (generalized): Secondary | ICD-10-CM | POA: Diagnosis not present

## 2018-01-23 DIAGNOSIS — R29818 Other symptoms and signs involving the nervous system: Secondary | ICD-10-CM | POA: Diagnosis not present

## 2018-01-23 DIAGNOSIS — R2681 Unsteadiness on feet: Secondary | ICD-10-CM

## 2018-01-23 DIAGNOSIS — R2689 Other abnormalities of gait and mobility: Secondary | ICD-10-CM | POA: Diagnosis not present

## 2018-01-23 NOTE — Therapy (Signed)
[facility] [address] [place], [place], [address] Phone: [phone]   Fax:  [phone]  Physical Therapy Treatment  Patient Details  Name: Scott Lane MRN: [id number] Date of Birth: [birth year] Referring Provider (PT): Marikay Alar, MD   Encounter Date: 01/23/2018  PT End of Session - 01/23/18 0852    Visit Number  7    Number of Visits  17    Date for PT Re-Evaluation  03/04/18    Authorization Type  [employer] [employer] Deductible.  VL: MN    PT Start Time  0847    PT Stop Time  0930    PT Time Calculation (min)  43 min    Equipment Utilized During Treatment  Gait belt    Activity Tolerance  Patient tolerated treatment well    Behavior During Therapy  WFL for tasks assessed/performed       Past Medical History:  Diagnosis Date  . Chicken pox     Past Surgical History:  Procedure Laterality Date  . LUMBAR LAMINECTOMY/DECOMPRESSION MICRODISCECTOMY N/A 08/01/2017   Procedure: Thoracic nine, ten, eleven, twelve laminectomy;  Surgeon: Tia Alert, MD;  Location: [facility] OR;  Service: Neurosurgery;  Laterality: N/A;  . none    . THORACIC DISCECTOMY N/A 12/27/2017   Procedure: Laminectomy and Foraminotomy - Thoracic one - Thoracic three;  Surgeon: Tia Alert, MD;  Location: [facility] OR;  Service: Neurosurgery;  Laterality: N/A;  . WISDOM TOOTH EXTRACTION     bilateral lower.    There were no vitals filed for this visit.  Subjective Assessment - 01/23/18 0851    Subjective  Pt reports doing well today, no changes since last visit.     Pertinent History  Patient enjoys maintaining a very active life style     Limitations  Standing;Walking;Lifting;Other (comment)    How long can you stand comfortably?  8-10 minutes    How long can you walk comfortably?  uses cane and RW household distance, RW for community distances    Diagnostic tests  MRI     Patient Stated Goals  Ambulation with no AD to get back to prior  level of function.     Currently in Pain?  No/denies                       [facility] Adult PT Treatment/Exercise - 01/23/18 0904      Neuro Re-ed    Neuro Re-ed Details   Gait without device x 230' in order to work on improved postural control with improved R LE stepping.  Note marked improvement in fluidity of LLE movement, R step length as well as decreased lateral trunk lean.  Provided light tactile cues at trunk to maintain upright posture.  Foward stepping over black balance beam with RLE to increase LLE WB with emphasis on L quad and L glute activation.  Transitioned to R stepping with forward loading, again for improved L hip extension and knee extension.  maintaining balance on small rocker board x 2 sets of 20 secs.  Moving board laterally with emphasis on L lateral weight shift with active push through LLE x 15 reps.  Keeping LLE in stance on rocker board advancing RLE from floor to chair seat.  This was too difficult and pt had HEAVY reliance on UEs, therefore switched chair to cone to have pt tap with RLE.  Pt able to do with BUE>single UE support x 15 reps.  Supine  bridging with BLEs on physioball x 10 reps (arms clasped over chest), BLE bridging with hamstring curl on physioabll x 10 reps.  Light tactile cues for improved stability in pelvis with exercise.  Standing facing elevated hi/low mat with BUE support on mat moving RLE into "curtsey" position and back to midline x 5 reps progressing to single UE support on mat and single UE support on ball performing same task x 3 reps.  Light tactile support at L knee to prevent buckle, no buckling evident during task.  Had only single rep of knee buckle during session.               PT Education - 01/23/18 [phone]    Education provided  Yes    Education Details  participating in [place] next week with rep    Person(s) Educated  Patient    Methods  Explanation    Comprehension  Verbalized understanding       PT Short Term  Goals - 01/06/18 1304      PT SHORT TERM GOAL #1   Title  Pt will improve standing balance as indicated by increased BERG balance score to >/= 42/56  (ALL STG due by 02/02/18)    Baseline  9/27: 38/56 indicative of significant fall risk potential     Time  4    Period  Weeks    Status  New      PT SHORT TERM GOAL #2   Title  Pt will participate in FGA assessment to establish fall risk potential with dynamic functional mobility     Baseline  10/30    Time  4    Period  Weeks    Status  Achieved      PT SHORT TERM GOAL #3   Title  Pt will improve gait speed to >/=2.6 ft/sec with LRAD indicating improvement with functional mobility placing him in the community ambulator category     Baseline  9/27: 1.92 ft/sec    Time  4    Period  Weeks    Status  New      PT SHORT TERM GOAL #4   Title  Pt will perform 12 steps with single HR and SPC with reciprocal stepping pattern and supervision-min A indicating improvement with functional mobility and LE strength     Time  4    Period  Weeks    Status  New      PT SHORT TERM GOAL #5   Title  Pt will be able to ambulate 500 ft in the community over uneven surfaces, grass, ramps, and curbs with supervision using LRAD for improvement in community ambulation distances    Baseline  Currently ambulating with RW     Time  4    Period  Weeks    Status  New        PT Long Term Goals - 01/06/18 1304      PT LONG TERM GOAL #1   Title  Pt will be independent with balance and LE strengthening HEP to further improve functional independence (ALL LTG due by 03/04/18)    Time  8    Period  Weeks    Status  New      PT LONG TERM GOAL #2   Title  Pt will improve standing balance and BERG >/= 50/56 to decrease falls risk    Baseline  9/27 eval: 38/56    Time  8    Period  Weeks  Status  New      PT LONG TERM GOAL #3   Title  Pt will improve gait velocity to >/= 3.0 ft/sec with LRAD indicating improvement in functional mobility     Baseline   9/27: 1.92 ft/sec    Time  8    Period  Weeks    Status  New      PT LONG TERM GOAL #4   Title  Pt will ambulate x 1000 over outdoor surfaces + curb (paved and grassy) MOD I with LRAD to improve functional mobility with community distances    Time  8    Period  Weeks    Status  New      PT LONG TERM GOAL #5   Title  Pt will negotiate 12 steps with alternating stepping MOD I using single HR and SPC to improve functional mobility     Time  8    Period  Weeks    Status  New      PT LONG TERM GOAL #6   Title  Pt will demonstrate an FGA score of >/=19/30 to decrease fall risk potential with functional mobility using LRAD     Baseline  10/30    Time  8    Period  Weeks    Status  New            Plan - 01/23/18 1200    Clinical Impression Statement  Skilled session continues to focus on LLE strengthening in open and closed chain NMR exercises.  Note marked improvement in gait today without device with less lateral trunk lean with L stance phase of gait.     Rehab Potential  Good    Clinical Impairments Affecting Rehab Potential  Pt currently reports pain and tightness to mid thoracic region near surgical incision which is limiting his functional mobility     PT Frequency  2x / week    PT Duration  8 weeks    PT Treatment/Interventions  ADLs/Self Care Home Management;Cryotherapy;Electrical Stimulation;Moist Heat;DME Instruction;Gait Network engineer;Therapeutic activities;Functional mobility training;Therapeutic exercise;Balance training;Neuromuscular re-education;Patient/family education;Orthotic Fit/Training;Passive range of motion;Taping;Aquatic Therapy    PT Next Visit Plan  re-tape as needed:   gait training with SPC and balance without AD, step length on ramp promoting increased L LE weight bearing, postural training,  Continue gentle ROM of neck and shoulders, aerobic exercise, core strengthening.     PT Home Exercise Plan  TBD    Consulted and Agree with Plan of Care   Patient       Patient will benefit from skilled therapeutic intervention in order to improve the following deficits and impairments:  Abnormal gait, Decreased balance, Decreased coordination, Decreased strength, Difficulty walking, Increased muscle spasms, Impaired sensation, Pain, Impaired tone, Decreased range of motion  Visit Diagnosis: Muscle weakness (generalized)  Other abnormalities of gait and mobility  Unsteadiness     Problem List Patient Active Problem List   Diagnosis Date Noted  . S/P lumbar laminectomy 12/27/2017  . Paraplegia (HCC) 11/07/2017  . Quadriplegia, unspecified (HCC) 11/07/2017  . Leukocytosis   . Neuropathic pain   . Drug induced constipation   . Urinary retention   . Class 1 obesity due to excess calories with serious comorbidity and body mass index (BMI) of 33.0 to 33.9 in adult   . Myelopathy (HCC) 08/01/2017  . Thoracic spinal stenosis 07/31/2017    Harriet Butte, PT, MPT [facility] [address] [place], [place], [address] Phone:  [phone]   Fax:  [phone] 01/23/18, 12:02 PM  Name: Scott Lane MRN: [id number] Date of Birth: [birth year]

## 2018-01-29 ENCOUNTER — Ambulatory Visit: Payer: 59 | Admitting: Rehabilitative and Restorative Service Providers"

## 2018-01-29 ENCOUNTER — Encounter: Payer: Self-pay | Admitting: Rehabilitative and Restorative Service Providers"

## 2018-01-29 DIAGNOSIS — M6281 Muscle weakness (generalized): Secondary | ICD-10-CM | POA: Diagnosis not present

## 2018-01-29 DIAGNOSIS — R29818 Other symptoms and signs involving the nervous system: Secondary | ICD-10-CM | POA: Diagnosis not present

## 2018-01-29 DIAGNOSIS — R2689 Other abnormalities of gait and mobility: Secondary | ICD-10-CM | POA: Diagnosis not present

## 2018-01-29 DIAGNOSIS — R2681 Unsteadiness on feet: Secondary | ICD-10-CM

## 2018-01-29 DIAGNOSIS — M546 Pain in thoracic spine: Secondary | ICD-10-CM | POA: Diagnosis not present

## 2018-01-29 NOTE — Therapy (Signed)
[facility] [address] [place], [place], [address] Phone: [phone]   Fax:  [phone]  Physical Therapy Treatment  Patient Details  Name: Scott Lane MRN: [id number] Date of Birth: 1985/06/19 Referring Provider (PT): Marikay Alar, MD   Encounter Date: 01/29/2018  PT End of Session - 01/29/18 0934    Visit Number  8    Number of Visits  17    Date for PT Re-Evaluation  03/04/18    Authorization Type  [employer] Deductible.  VL: MN    PT Start Time  0930    PT Stop Time  1015    PT Time Calculation (min)  45 min    Equipment Utilized During Treatment  Gait belt    Activity Tolerance  Patient tolerated treatment well    Behavior During Therapy  WFL for tasks assessed/performed       Past Medical History:  Diagnosis Date  . Chicken pox     Past Surgical History:  Procedure Laterality Date  . LUMBAR LAMINECTOMY/DECOMPRESSION MICRODISCECTOMY N/A 08/01/2017   Procedure: Thoracic nine, ten, eleven, twelve laminectomy;  Surgeon: Tia Alert, MD;  Location: [facility] OR;  Service: Neurosurgery;  Laterality: N/A;  . none    . THORACIC DISCECTOMY N/A 12/27/2017   Procedure: Laminectomy and Foraminotomy - Thoracic one - Thoracic three;  Surgeon: Tia Alert, MD;  Location: [facility] OR;  Service: Neurosurgery;  Laterality: N/A;  . WISDOM TOOTH EXTRACTION     bilateral lower.    There were no vitals filed for this visit.  Subjective Assessment - 01/29/18 2019    Subjective  The patient worked out at the gym this morning before coming to PT (nustep and seated elliptical).    Pertinent History  Patient enjoys maintaining a very active life style     Patient Stated Goals  Ambulation with no AD to get back to prior level of function.     Currently in Pain?  No/denies                       [facility] Adult PT Treatment/Exercise - 01/29/18 1010      Ambulation/Gait   Ambulation/Gait  Yes    Ambulation/Gait Assistance  4: Min guard    Ambulation/Gait Assistance Details  Patient ambulated into clinic with SPC at slow pace, patient performed treadmill x 5 minutes at 0% incline/ 0. increasing up to 6% incline/0.9 mph with UE support and CGA.  PT encouraging patient to maintain heel contact throughout mid stance until initiation of swing phase.  Gait with one forearm crutch on level surface emphasizing speed of movement (used crutch to have more support while increasing speed).      Ambulation Distance (Feet)  345 Feet    Assistive device  None;Straight cane;R Forearm Crutch    Gait Pattern  Step-through pattern;Decreased stride length;Decreased hip/knee flexion - right;Decreased hip/knee flexion - left;Decreased weight shift to left;Decreased trunk rotation;Wide base of support;Decreased stance time - left;Decreased step length - right;Decreased arm swing - left    Ambulation Surface  Level;Indoor      Neuro Re-ed    Neuro Re-ed Details   left leg weight shifting activities near parallel bars performing towel slide laterally with R leg and tactile cues L knee, sitting on elevated mat performing R LE marching and LE extension to use L LE to stabilize through ground,, attempted seated bridges with marching (unable at this time).  Exercises   Exercises  Other Exercises    Other Exercises   Supine bridging adding marching x 4 reps x 3 sets, physioball bridge + hip and knee flexion, hamstring stretching (due to cramping with bridges).   Standing with elbows supported on countertop + knee flexion + alternating hip extension with PT providing tactile cues and support for L knee. Standing heel cord stretch L LE with UE support.  Leg press machine 30# and 40# L LE only working on control t/o movement.               PT Short Term Goals - 01/06/18 1304      PT SHORT TERM GOAL #1   Title  Pt will improve standing balance as indicated by increased BERG balance score to >/= 42/56  (ALL  STG due by 02/02/18)    Baseline  9/27: 38/56 indicative of significant fall risk potential     Time  4    Period  Weeks    Status  New      PT SHORT TERM GOAL #2   Title  Pt will participate in FGA assessment to establish fall risk potential with dynamic functional mobility     Baseline  10/30    Time  4    Period  Weeks    Status  Achieved      PT SHORT TERM GOAL #3   Title  Pt will improve gait speed to >/=2.6 ft/sec with LRAD indicating improvement with functional mobility placing him in the community ambulator category     Baseline  9/27: 1.92 ft/sec    Time  4    Period  Weeks    Status  New      PT SHORT TERM GOAL #4   Title  Pt will perform 12 steps with single HR and SPC with reciprocal stepping pattern and supervision-min A indicating improvement with functional mobility and LE strength     Time  4    Period  Weeks    Status  New      PT SHORT TERM GOAL #5   Title  Pt will be able to ambulate 500 ft in the community over uneven surfaces, grass, ramps, and curbs with supervision using LRAD for improvement in community ambulation distances    Baseline  Currently ambulating with RW     Time  4    Period  Weeks    Status  New        PT Long Term Goals - 01/06/18 1304      PT LONG TERM GOAL #1   Title  Pt will be independent with balance and LE strengthening HEP to further improve functional independence (ALL LTG due by 03/04/18)    Time  8    Period  Weeks    Status  New      PT LONG TERM GOAL #2   Title  Pt will improve standing balance and BERG >/= 50/56 to decrease falls risk    Baseline  9/27 eval: 38/56    Time  8    Period  Weeks    Status  New      PT LONG TERM GOAL #3   Title  Pt will improve gait velocity to >/= 3.0 ft/sec with LRAD indicating improvement in functional mobility     Baseline  9/27: 1.92 ft/sec    Time  8    Period  Weeks    Status  New  PT LONG TERM GOAL #4   Title  Pt will ambulate x 1000 over outdoor surfaces + curb  (paved and grassy) MOD I with LRAD to improve functional mobility with community distances    Time  8    Period  Weeks    Status  New      PT LONG TERM GOAL #5   Title  Pt will negotiate 12 steps with alternating stepping MOD I using single HR and SPC to improve functional mobility     Time  8    Period  Weeks    Status  New      PT LONG TERM GOAL #6   Title  Pt will demonstrate an FGA score of >/=19/30 to decrease fall risk potential with functional mobility using LRAD     Baseline  10/30    Time  8    Period  Weeks    Status  New            Plan - 01/29/18 2027    Clinical Impression Statement  PT emphasized gait speed, L LE strengthening, L LE stretching to improve gait mechanics.  The patient is motivated and has started gym routine working on reciprocal LE actiities, general strength/conditioning.      PT Treatment/Interventions  ADLs/Self Care Home Management;Cryotherapy;Electrical Stimulation;Moist Heat;DME Instruction;Gait Network engineer;Therapeutic activities;Functional mobility training;Therapeutic exercise;Balance training;Neuromuscular re-education;Patient/family education;Orthotic Fit/Training;Passive range of motion;Taping;Aquatic Therapy    PT Next Visit Plan  L heel cord stretch *emphasize for home*, L LE weight shifting, L LE strengthening, gait speed/treadmill + consider faster gait with forearm crutch in clinic, postural training, gentle ROM neck/shoulders.    Consulted and Agree with Plan of Care  Patient       Patient will benefit from skilled therapeutic intervention in order to improve the following deficits and impairments:  Abnormal gait, Decreased balance, Decreased coordination, Decreased strength, Difficulty walking, Increased muscle spasms, Impaired sensation, Pain, Impaired tone, Decreased range of motion  Visit Diagnosis: Muscle weakness (generalized)  Other abnormalities of gait and mobility  Unsteadiness  Other symptoms and signs  involving the nervous system     Problem List Patient Active Problem List   Diagnosis Date Noted  . S/P lumbar laminectomy 12/27/2017  . Paraplegia (HCC) 11/07/2017  . Quadriplegia, unspecified (HCC) 11/07/2017  . Leukocytosis   . Neuropathic pain   . Drug induced constipation   . Urinary retention   . Class 1 obesity due to excess calories with serious comorbidity and body mass index (BMI) of 33.0 to 33.9 in adult   . Myelopathy (HCC) 08/01/2017  . Thoracic spinal stenosis 07/31/2017    Loray Akard, PT 01/29/2018, 8:30 PM  [facility] [address] [place], [place], [address] Phone: [phone]   Fax:  [phone]  Name: AJ CRUNKLETON MRN: [id number] Date of Birth: [birth year]

## 2018-01-30 ENCOUNTER — Ambulatory Visit: Payer: 59 | Admitting: Physical Therapy

## 2018-01-31 ENCOUNTER — Ambulatory Visit: Payer: 59 | Admitting: Physical Therapy

## 2018-01-31 ENCOUNTER — Encounter: Payer: Self-pay | Admitting: Physical Therapy

## 2018-01-31 DIAGNOSIS — R2689 Other abnormalities of gait and mobility: Secondary | ICD-10-CM | POA: Diagnosis not present

## 2018-01-31 DIAGNOSIS — R2681 Unsteadiness on feet: Secondary | ICD-10-CM | POA: Diagnosis not present

## 2018-01-31 DIAGNOSIS — R29818 Other symptoms and signs involving the nervous system: Secondary | ICD-10-CM | POA: Diagnosis not present

## 2018-01-31 DIAGNOSIS — M546 Pain in thoracic spine: Secondary | ICD-10-CM | POA: Diagnosis not present

## 2018-01-31 DIAGNOSIS — M6281 Muscle weakness (generalized): Secondary | ICD-10-CM | POA: Diagnosis not present

## 2018-01-31 NOTE — Therapy (Signed)
[facility] [address] [place], [place], [address] Phone: [phone]   Fax:  [phone]  Physical Therapy Treatment  Patient Details  Name: Scott Lane MRN: [id number] Date of Birth: [birth year] Referring Provider (PT): Sherley Bounds, MD   Encounter Date: 01/31/2018  PT End of Session - 01/31/18 0948    Visit Number  9    Number of Visits  17    Date for PT Re-Evaluation  03/04/18    Authorization Type  [employer] Family Deductible.  VL: MN    PT Start Time  0810    PT Stop Time  0845    PT Time Calculation (min)  35 min    Equipment Utilized During Treatment  Gait belt    Activity Tolerance  Patient tolerated treatment well    Behavior During Therapy  WFL for tasks assessed/performed       Past Medical History:  Diagnosis Date  . Chicken pox     Past Surgical History:  Procedure Laterality Date  . LUMBAR LAMINECTOMY/DECOMPRESSION MICRODISCECTOMY N/A 08/01/2017   Procedure: Thoracic nine, ten, eleven, twelve laminectomy;  Surgeon: Eustace Moore, MD;  Location: [facility];  Service: Neurosurgery;  Laterality: N/A;  . none    . THORACIC DISCECTOMY N/A 12/27/2017   Procedure: Laminectomy and Foraminotomy - Thoracic one - Thoracic three;  Surgeon: Eustace Moore, MD;  Location: [facility];  Service: Neurosurgery;  Laterality: N/A;  . WISDOM TOOTH EXTRACTION     bilateral lower.    There were no vitals filed for this visit.  Subjective Assessment - 01/31/18 0811    Subjective  Reports going to a nutritionist and this week he began to eat more healthy. Reports working out 3x/wk at [employer]. Reports HEP exercises are going well.     Patient is accompained by:  Family member   Wife T   Pertinent History  Patient enjoys maintaining a very active life style     Limitations  Standing;Walking;Lifting;Other (comment)    How long can you stand comfortably?  8-10 minutes    How long can you walk comfortably?  uses cane and  RW household distance, RW for community distances    Diagnostic tests  MRI     Patient Stated Goals  Ambulation with no AD to get back to prior level of function.     Currently in Pain?  No/denies         [facility] PT Assessment - 01/31/18 0813      Ambulation/Gait   Ambulation/Gait  Yes    Gait velocity  1.70 ft/sec with SPC; 1.36 ft/sec without AD      Berg Balance Test   Sit to Stand  Able to stand without using hands and stabilize independently    Standing Unsupported  Able to stand safely 2 minutes    Sitting with Back Unsupported but Feet Supported on Floor or Stool  Able to sit safely and securely 2 minutes    Stand to Sit  Sits safely with minimal use of hands    Transfers  Able to transfer safely, minor use of hands    Standing Unsupported with Eyes Closed  Able to stand 10 seconds with supervision    Standing Ubsupported with Feet Together  Able to place feet together independently and stand for 1 minute with supervision    From Standing, Reach Forward with Outstretched Arm  Can reach confidently >25 cm (10")    From Standing Position,  Pick up Object from [place] to pick up shoe, needs supervision    From Standing Position, Turn to Look Behind Over each Shoulder  Looks behind from both sides and weight shifts well    Turn 360 Degrees  Able to turn 360 degrees safely but slowly    Standing Unsupported, Alternately Place Feet on Step/Stool  Able to complete >2 steps/needs minimal assist    Standing Unsupported, One Foot in Front  Able to place foot tandem independently and hold 30 seconds    Standing on One Leg  Able to lift leg independently and hold 5-10 seconds    Total Score  47    Berg comment:  47/56 indicative of moderate fall risk potential       Functional Gait  Assessment   Gait assessed   Yes    Gait Level Surface  Walks 20 ft, slow speed, abnormal gait pattern, evidence for imbalance or deviates 10-15 in outside of the 12 in walkway width. Requires more than 7  sec to ambulate 20 ft.    Change in Gait Speed  Makes only minor adjustments to walking speed, or accomplishes a change in speed with significant gait deviations, deviates 10-15 in outside the 12 in walkway width, or changes speed but loses balance but is able to recover and continue walking.    Gait with Horizontal Head Turns  Performs head turns with moderate changes in gait velocity, slows down, deviates 10-15 in outside 12 in walkway width but recovers, can continue to walk.    Gait with Vertical Head Turns  Performs task with slight change in gait velocity (eg, minor disruption to smooth gait path), deviates 6 - 10 in outside 12 in walkway width or uses assistive device    Gait and Pivot Turn  Turns slowly, requires verbal cueing, or requires several small steps to catch balance following turn and stop    Step Over Obstacle  Is able to step over one shoe box (4.5 in total height) but must slow down and adjust steps to clear box safely. May require verbal cueing.    Gait with Narrow Base of Support  Ambulates less than 4 steps heel to toe or cannot perform without assistance.    Gait with Eyes Closed  Walks 20 ft, slow speed, abnormal gait pattern, evidence for imbalance, deviates 10-15 in outside 12 in walkway width. Requires more than 9 sec to ambulate 20 ft.    Ambulating Backwards  Walks 20 ft, slow speed, abnormal gait pattern, evidence for imbalance, deviates 10-15 in outside 12 in walkway width.    Steps  Alternating feet, must use rail.    Total Score  11    FGA comment:  11/30                   [facility] Adult PT Treatment/Exercise - 01/31/18 0813      Ambulation/Gait   Ambulation/Gait Assistance  5: Supervision    Ambulation/Gait Assistance Details  Pt ambulates outdoors on uneven surfaces, negotiates curbs, and demonstrates ability to vary gait speed when cued using his [facility] with therapist providing SPV.     Ambulation Distance (Feet)  500 Feet    Assistive device  Straight  cane    Gait Pattern  Step-through pattern;Decreased stride length;Decreased hip/knee flexion - right;Decreased hip/knee flexion - left;Decreased weight shift to left;Decreased trunk rotation;Wide base of support;Decreased stance time - left;Decreased step length - right;Decreased arm swing - left    Ambulation  Surface  Outdoor;Paved;Grass    Stairs  Yes    Stairs Assistance  5: Supervision    Stairs Assistance Details (indicate cue type and reason)  Pt performs 12 steps with bilateral hand rails with therapist providing supervision. Pt demonstrates increased reliance on UE's 2/2 to B LE weakness L > R    Stair Management Technique  Two rails;Alternating pattern    Number of Stairs  12    Height of Stairs  6    Curb  5: Supervision    Curb Details (indicate cue type and reason)  Pt performs curb with SPC with proper technique and therapist providing supervision             PT Education - 01/31/18 0947    Education provided  Yes    Education Details  Therapist provided education regarding clinical findings for STG reassessment and to continue performing HEP stretches at home.     Person(s) Educated  Patient    Methods  Explanation    Comprehension  Verbalized understanding       PT Short Term Goals - 01/31/18 0949      PT SHORT TERM GOAL #1   Title  Pt will improve standing balance as indicated by increased BERG balance score to >/= 42/56  (ALL STG due by 02/02/18)    Baseline  10/25: 47/56 indicative of moderate fall risk potential     Time  4    Period  Weeks    Status  Achieved      PT SHORT TERM GOAL #2   Title  Pt will participate in FGA assessment to establish fall risk potential with dynamic functional mobility     Baseline  10/25: 11/30    Time  4    Period  Weeks    Status  Achieved      PT SHORT TERM GOAL #3   Title  Pt will improve gait speed to >/=2.6 ft/sec with LRAD indicating improvement with functional mobility placing him in the community ambulator  category     Baseline  10/25: 1.70 ft/sec with SPC indicative of limited community ambulator; 1.36 ft/sec with no AD indicative of limited community ambulator     Time  4    Period  Weeks    Status  Not Met      PT SHORT TERM GOAL #4   Title  Pt will perform 12 steps with single HR and SPC with reciprocal stepping pattern and supervision-min A indicating improvement with functional mobility and LE strength     Baseline  10/25: 12 steps with reciprocal stepping and bilateral HR's with supervision     Time  4    Period  Weeks    Status  Partially Met      PT SHORT TERM GOAL #5   Title  Pt will be able to ambulate 500 ft in the community over uneven surfaces, grass, ramps, and curbs with supervision using LRAD for improvement in community ambulation distances    Baseline  10/25: ambulated 500 feet with SPC and supervision     Time  4    Period  Weeks    Status  Achieved        PT Long Term Goals - 01/31/18 8366      PT LONG TERM GOAL #1   Title  Pt will be independent with balance and LE strengthening HEP to further improve functional independence (ALL LTG due by 03/04/18)    Time  8    Period  Weeks    Status  New      PT LONG TERM GOAL #2   Title  Pt will improve standing balance and BERG >/= 50/56 to decrease falls risk    Baseline  9/27 eval: 38/56    Time  8    Period  Weeks    Status  New      PT LONG TERM GOAL #3   Title  Pt will improve gait velocity to >/= 2.62 ft/sec with LRAD indicating improvement in functional mobility     Baseline  10/25: 1.7 ft/sec with SPC; 1.36 ft/sec with no AD    Time  8    Period  Weeks    Status  Revised      PT LONG TERM GOAL #4   Title  Pt will ambulate x 1000 over outdoor surfaces + curb (paved and grassy) MOD I with LRAD to improve functional mobility with community distances    Time  8    Period  Weeks    Status  New      PT LONG TERM GOAL #5   Title  Pt will negotiate 12 steps with alternating stepping MOD I using single HR  and SPC to improve functional mobility     Time  8    Period  Weeks    Status  New      PT LONG TERM GOAL #6   Title  Pt will demonstrate an FGA score of >/=19/30 to decrease fall risk potential with functional mobility using LRAD     Baseline  10/30    Time  8    Period  Weeks    Status  New            Plan - 01/31/18 0881    Clinical Impression Statement  Skilled session focused on reassessment of patient STG's. Patient tolerated session well and overall has met 3/5 STG's, partially met 1/5 and did not meet 1/5. Pt demonstrates improvement in balance, dynamic gait, and outdoor ambulation for community distances with reduced fall risk potential. Pt did not meet his gait speed goal of >/=2.6 ft/ sec indicated by his gait speed with an AD to be 1.70 ft/sec and with no AD 1.36 ft/sec. Therapist revised long term gait speed goal to accurately represent patient presentation. Pt continues to demonstrate increased reliance on UE's during stair negotiation trials 2/2 to LE weakness with fear of L knee buckling. Pt will continue to benefit from skilled PT to address strength, balance, and functional mobility deficits.      PT Treatment/Interventions  ADLs/Self Care Home Management;Cryotherapy;Electrical Stimulation;Moist Heat;DME Instruction;Gait Scientist, forensic;Therapeutic activities;Functional mobility training;Therapeutic exercise;Balance training;Neuromuscular re-education;Patient/family education;Orthotic Fit/Training;Passive range of motion;Taping;Aquatic Therapy    PT Next Visit Plan  L heel cord stretch *emphasize for home*, bioness with strengthening HEP, pulsed lounges, LAQ on machine at gym, L LE weight shifting, L LE strengthening, gait speed/treadmill + consider faster gait with forearm crutch in clinic, postural training, gentle ROM neck/shoulders.    Consulted and Agree with Plan of Care  Patient       Patient will benefit from skilled therapeutic intervention in order to  improve the following deficits and impairments:  Abnormal gait, Decreased balance, Decreased coordination, Decreased strength, Difficulty walking, Increased muscle spasms, Impaired sensation, Pain, Impaired tone, Decreased range of motion  Visit Diagnosis: Muscle weakness (generalized)  Other abnormalities of gait and mobility  Unsteadiness     Problem List Patient  Active Problem List   Diagnosis Date Noted  . S/P lumbar laminectomy 12/27/2017  . Paraplegia ([place]) 11/07/2017  . Quadriplegia, unspecified ([place]) 11/07/2017  . Leukocytosis   . Neuropathic pain   . Drug induced constipation   . Urinary retention   . Class 1 obesity due to excess calories with serious comorbidity and body mass index (BMI) of 33.0 to 33.9 in adult   . Myelopathy ([place]) 08/01/2017  . Thoracic spinal stenosis 07/31/2017    Floreen Comber, SPT 01/31/2018, 10:04 AM  [facility] [address] [place], [place], [address] Phone: [phone]   Fax:  [phone]  Name: WYNTER GRAVE MRN: [id number] Date of Birth: [birth year]

## 2018-02-03 ENCOUNTER — Ambulatory Visit: Payer: 59 | Admitting: Rehabilitative and Restorative Service Providers"

## 2018-02-03 ENCOUNTER — Encounter: Payer: Self-pay | Admitting: Rehabilitative and Restorative Service Providers"

## 2018-02-03 DIAGNOSIS — R29818 Other symptoms and signs involving the nervous system: Secondary | ICD-10-CM | POA: Diagnosis not present

## 2018-02-03 DIAGNOSIS — R2689 Other abnormalities of gait and mobility: Secondary | ICD-10-CM | POA: Diagnosis not present

## 2018-02-03 DIAGNOSIS — M546 Pain in thoracic spine: Secondary | ICD-10-CM | POA: Diagnosis not present

## 2018-02-03 DIAGNOSIS — M6281 Muscle weakness (generalized): Secondary | ICD-10-CM | POA: Diagnosis not present

## 2018-02-03 DIAGNOSIS — R2681 Unsteadiness on feet: Secondary | ICD-10-CM | POA: Diagnosis not present

## 2018-02-03 NOTE — Therapy (Signed)
[facility] [address] [place], [place], [address] Phone: [phone]   Fax:  [phone]  Physical Therapy Treatment  Patient Details  Name: Scott Lane MRN: [id number] Date of Birth: 08/13/1985 Referring Provider (PT): David Jones, MD   Encounter Date: 02/03/2018  PT End of Session - 02/03/18 0946    Visit Number  10    Number of Visits  17    Date for PT Re-Evaluation  03/04/18    Authorization Type  [place] Family Deductible.  VL: MN    PT Start Time  0845    PT Stop Time  0930    PT Time Calculation (min)  45 min    Equipment Utilized During Treatment  Gait belt    Activity Tolerance  Patient tolerated treatment well    Behavior During Therapy  WFL for tasks assessed/performed       Past Medical History:  Diagnosis Date  . Chicken pox     Past Surgical History:  Procedure Laterality Date  . LUMBAR LAMINECTOMY/DECOMPRESSION MICRODISCECTOMY N/A 08/01/2017   Procedure: Thoracic nine, ten, eleven, twelve laminectomy;  Surgeon: Jones, David S, MD;  Location: [facility] OR;  Service: Neurosurgery;  Laterality: N/A;  . none    . THORACIC DISCECTOMY N/A 12/27/2017   Procedure: Laminectomy and Foraminotomy - Thoracic one - Thoracic three;  Surgeon: Jones, David S, MD;  Location: [facility] OR;  Service: Neurosurgery;  Laterality: N/A;  . WISDOM TOOTH EXTRACTION     bilateral lower.    There were no vitals filed for this visit.  Subjective Assessment - 02/03/18 0851    Subjective  Reports going to the gym on Friday and continues to experience L knee tightness. Reports his stretches are going well at home.     Patient is accompained by:  --   Wife T   Pertinent History  Patient enjoys maintaining a very active life style     Limitations  Standing;Walking;Lifting;Other (comment)    How long can you stand comfortably?  8-10 minutes    How long can you walk comfortably?  uses cane and RW household distance, RW for  community distances    Diagnostic tests  MRI     Patient Stated Goals  Ambulation with no AD to get back to prior level of function.     Currently in Pain?  No/denies          [facility] Adult PT Treatment/Exercise - 02/03/18 0938      Exercises   Exercises  Knee/Hip    Other Exercises   Pt performs half kneeling with L UE chair support for steadying. Pt demonstrates heavy reliance on L UE chair support for postural stability indicating reduced confidence to L LE for maintaining balance. Therapist performs isometric resistance to R UE with patient engaging core activation to maintain postural control. Pt then performs UE reaching with L UE for proper upright posture. Pt requires multiple rest breaks 2/2 to quick fatigue onset. Pt then attains tall kneeling position performing 1x5 reps of UE trunk rotation with therapist using resistance band for increased challenge. Pt requires rest break in between sets and verbal cues to perform trunk rotation with core stabilization exercise.       Lumbar Exercises: Supine   Bridge  5 reps   2 sets/ 5 reps   Bridge Limitations  Pt performs 2 sets/ 5 reps of hip bridging with R LE marching to increase WB through L LE. Pt requires rest   break in b/w each set 2/2 fatigue onset contributing to postural instability       Knee/Hip Exercises: Stretches   Passive Hamstring Stretch  3 reps;30 seconds;Left    Quad Stretch  Left;3 reps;30 seconds    ITB Stretch  Left;3 reps;30 seconds         PT Education - 02/03/18 0945    Education provided  Yes    Education Details  Therapist provided education regarding continuation of stretching exercises at home to improve LE flexibility and reduce pain. Therapist provided education for implication for trigger point dry needling to reduce muscle tightness to hamstring region.     Person(s) Educated  Patient    Methods  Explanation    Comprehension  Verbalized understanding       PT Short Term Goals - 01/31/18 0949       PT SHORT TERM GOAL #1   Title  Pt will improve standing balance as indicated by increased BERG balance score to >/= 42/56  (ALL STG due by 02/02/18)    Baseline  10/25: 47/56 indicative of moderate fall risk potential     Time  4    Period  Weeks    Status  Achieved      PT SHORT TERM GOAL #2   Title  Pt will participate in FGA assessment to establish fall risk potential with dynamic functional mobility     Baseline  10/25: 11/30    Time  4    Period  Weeks    Status  Achieved      PT SHORT TERM GOAL #3   Title  Pt will improve gait speed to >/=2.6 ft/sec with LRAD indicating improvement with functional mobility placing him in the community ambulator category     Baseline  10/25: 1.70 ft/sec with SPC indicative of limited community ambulator; 1.36 ft/sec with no AD indicative of limited community ambulator     Time  4    Period  Weeks    Status  Not Met      PT SHORT TERM GOAL #4   Title  Pt will perform 12 steps with single HR and SPC with reciprocal stepping pattern and supervision-min A indicating improvement with functional mobility and LE strength     Baseline  10/25: 12 steps with reciprocal stepping and bilateral HR's with supervision     Time  4    Period  Weeks    Status  Partially Met      PT SHORT TERM GOAL #5   Title  Pt will be able to ambulate 500 ft in the community over uneven surfaces, grass, ramps, and curbs with supervision using LRAD for improvement in community ambulation distances    Baseline  10/25: ambulated 500 feet with SPC and supervision     Time  4    Period  Weeks    Status  Achieved        PT Long Term Goals - 01/31/18 0973      PT LONG TERM GOAL #1   Title  Pt will be independent with balance and LE strengthening HEP to further improve functional independence (ALL LTG due by 03/04/18)    Time  8    Period  Weeks    Status  New      PT LONG TERM GOAL #2   Title  Pt will improve standing balance and BERG >/= 50/56 to decrease falls risk     Baseline  9/27 eval: 38/56  Time  8    Period  Weeks    Status  New      PT LONG TERM GOAL #3   Title  Pt will improve gait velocity to >/= 2.62 ft/sec with LRAD indicating improvement in functional mobility     Baseline  10/25: 1.7 ft/sec with SPC; 1.36 ft/sec with no AD    Time  8    Period  Weeks    Status  Revised      PT LONG TERM GOAL #4   Title  Pt will ambulate x 1000 over outdoor surfaces + curb (paved and grassy) MOD I with LRAD to improve functional mobility with community distances    Time  8    Period  Weeks    Status  New      PT LONG TERM GOAL #5   Title  Pt will negotiate 12 steps with alternating stepping MOD I using single HR and SPC to improve functional mobility     Time  8    Period  Weeks    Status  New      PT LONG TERM GOAL #6   Title  Pt will demonstrate an FGA score of >/=19/30 to decrease fall risk potential with functional mobility using LRAD     Baseline  10/30    Time  8    Period  Weeks    Status  New            Plan - 02/03/18 0953    Clinical Impression Statement  Skilled session focused on LE stretching to improve flexibility and reduce L knee tightness, and postural stability exercises with core stabilization. Patient demonstrates significant L LE tightness restricting ROM and increasing patient discomfort. Pt reports mild relief of symptoms with LE stretching reporting he will routinely perform stretches at home. Pt demonstrates decreased postural control indicated by increased reliance on UE support in half kneeling position. Pt will continue to benefit from LE strengthening, flexibility, and core stabilization exercises to improve functional mobility.     PT Treatment/Interventions  ADLs/Self Care Home Management;Cryotherapy;Electrical Stimulation;Moist Heat;DME Instruction;Gait training;Stair training;Therapeutic activities;Functional mobility training;Therapeutic exercise;Balance training;Neuromuscular re-education;Patient/family  education;Orthotic Fit/Training;Passive range of motion;Taping;Aquatic Therapy;Dry needling    PT Next Visit Plan  dry needling recert ?? L heel cord stretch *emphasize for home*, core stabilization, eccentric quads, phyiso ball core/ LE marching, standing with R LE on block performing UE movements with resistance to inc L LE WB, step ups, bioness with strengthening HEP, pulsed lounges, LAQ on machine at gym, L LE weight shifting, L LE strengthening, gait speed/treadmill + consider faster gait with forearm crutch in clinic, postural training, gentle ROM neck/shoulders.    Consulted and Agree with Plan of Care  Patient       Patient will benefit from skilled therapeutic intervention in order to improve the following deficits and impairments:  Abnormal gait, Decreased balance, Decreased coordination, Decreased strength, Difficulty walking, Increased muscle spasms, Impaired sensation, Pain, Impaired tone, Decreased range of motion  Visit Diagnosis: Muscle weakness (generalized)  Other abnormalities of gait and mobility     Problem List Patient Active Problem List   Diagnosis Date Noted  . S/P lumbar laminectomy 12/27/2017  . Paraplegia (HCC) 11/07/2017  . Quadriplegia, unspecified (HCC) 11/07/2017  . Leukocytosis   . Neuropathic pain   . Drug induced constipation   . Urinary retention   . Class 1 obesity due to excess calories with serious comorbidity and body mass index (BMI) of 33.0   to 33.9 in adult   . Myelopathy (HCC) 08/01/2017  . Thoracic spinal stenosis 07/31/2017    Cara Poole, SPT 02/03/2018, 9:58 AM  [facility] [address] [place], [place], [address] Phone: [phone]   Fax:  [phone]  Name: Tyquavious M Dung MRN: [id number] Date of Birth: 12/30/1985   

## 2018-02-05 ENCOUNTER — Ambulatory Visit: Payer: 59 | Admitting: Physical Therapy

## 2018-02-05 DIAGNOSIS — R2681 Unsteadiness on feet: Secondary | ICD-10-CM | POA: Diagnosis not present

## 2018-02-05 DIAGNOSIS — R2689 Other abnormalities of gait and mobility: Secondary | ICD-10-CM | POA: Diagnosis not present

## 2018-02-05 DIAGNOSIS — M546 Pain in thoracic spine: Secondary | ICD-10-CM | POA: Diagnosis not present

## 2018-02-05 DIAGNOSIS — M6281 Muscle weakness (generalized): Secondary | ICD-10-CM

## 2018-02-05 DIAGNOSIS — R29818 Other symptoms and signs involving the nervous system: Secondary | ICD-10-CM | POA: Diagnosis not present

## 2018-02-06 ENCOUNTER — Ambulatory Visit: Payer: 59 | Admitting: Physical Therapy

## 2018-02-06 NOTE — Therapy (Signed)
[facility] [address] [place], [place], [address] Phone: [phone]   Fax:  [phone]  Physical Therapy Treatment  Patient Details  Name: Scott Lane MRN: [id number] Date of Birth: 11-10-1985 Referring Provider (PT): Sherley Bounds, MD   Encounter Date: 02/05/2018  PT End of Session - 02/06/18 1035    Visit Number  11    Number of Visits  17    Date for PT Re-Evaluation  03/04/18    Authorization Type  [place] Family Deductible.  VL: MN    PT Start Time  1545    PT Stop Time  1630    PT Time Calculation (min)  45 min       Past Medical History:  Diagnosis Date  . Chicken pox     Past Surgical History:  Procedure Laterality Date  . LUMBAR LAMINECTOMY/DECOMPRESSION MICRODISCECTOMY N/A 08/01/2017   Procedure: Thoracic nine, ten, eleven, twelve laminectomy;  Surgeon: Eustace Moore, MD;  Location: [facility];  Service: Neurosurgery;  Laterality: N/A;  . none    . THORACIC DISCECTOMY N/A 12/27/2017   Procedure: Laminectomy and Foraminotomy - Thoracic one - Thoracic three;  Surgeon: Eustace Moore, MD;  Location: [facility];  Service: Neurosurgery;  Laterality: N/A;  . WISDOM TOOTH EXTRACTION     bilateral lower.    There were no vitals filed for this visit.  Subjective Assessment - 02/06/18 1035    Subjective  Pt states his walking has improved since 2nd surgery; is glad to be returning to aquatic therapy    Patient Stated Goals  Ambulation with no AD to get back to prior level of function.     Currently in Pain?  No/denies        Aquatic Therapy: pool temp 87 degrees  Patient seen for aquatic therapy today.  Treatment took place in water 3.5-4 feet deep depending upon activity.  Pt entered and exited the pool via Step negotiation with use of hand rail. Pt performed RLE and LLE stretches for hamstring and heel cord at beginning and end of session 1 rep 30 sec hold each; runner's stretch and forefoot On pool  wall for gastroc stretch;  Pt performed hip extension, flexion, and abduction with flotation ankle cuff weight 10 reps each leg Marching with cuff weights on each leg - 30' across pool  Ai Chi postures to facilitate turning with weight shift laterally and anteriorly/posteriorly - pt had LOB with the anterior/posterior weight shift pt performed ballistic movements using buoyancy of water for support and to assist with LOB: pt performed jumping x 10 reps Single limb hopping on each leg without UE support 10 reps Jogging across pool 30' x 2 reps without UE support; backwards 30' x 1 rep without UE support  Pt performed resistive amb. In water - 4 reps forward (30') and then sideways resisted with use of resistive tubing x 2 reps for strengthening and for perturnbations With balance                        PT Short Term Goals - 01/31/18 0949      PT SHORT TERM GOAL #1   Title  Pt will improve standing balance as indicated by increased BERG balance score to >/= 42/56  (ALL STG due by 02/02/18)    Baseline  10/25: 47/56 indicative of moderate fall risk potential     Time  4    Period  Weeks  Status  Achieved      PT SHORT TERM GOAL #2   Title  Pt will participate in FGA assessment to establish fall risk potential with dynamic functional mobility     Baseline  10/25: 11/30    Time  4    Period  Weeks    Status  Achieved      PT SHORT TERM GOAL #3   Title  Pt will improve gait speed to >/=2.6 ft/sec with LRAD indicating improvement with functional mobility placing him in the community ambulator category     Baseline  10/25: 1.70 ft/sec with SPC indicative of limited community ambulator; 1.36 ft/sec with no AD indicative of limited community ambulator     Time  4    Period  Weeks    Status  Not Met      PT SHORT TERM GOAL #4   Title  Pt will perform 12 steps with single HR and SPC with reciprocal stepping pattern and supervision-min A indicating improvement with  functional mobility and LE strength     Baseline  10/25: 12 steps with reciprocal stepping and bilateral HR's with supervision     Time  4    Period  Weeks    Status  Partially Met      PT SHORT TERM GOAL #5   Title  Pt will be able to ambulate 500 ft in the community over uneven surfaces, grass, ramps, and curbs with supervision using LRAD for improvement in community ambulation distances    Baseline  10/25: ambulated 500 feet with SPC and supervision     Time  4    Period  Weeks    Status  Achieved        PT Long Term Goals - 01/31/18 0630      PT LONG TERM GOAL #1   Title  Pt will be independent with balance and LE strengthening HEP to further improve functional independence (ALL LTG due by 03/04/18)    Time  8    Period  Weeks    Status  New      PT LONG TERM GOAL #2   Title  Pt will improve standing balance and BERG >/= 50/56 to decrease falls risk    Baseline  9/27 eval: 38/56    Time  8    Period  Weeks    Status  New      PT LONG TERM GOAL #3   Title  Pt will improve gait velocity to >/= 2.62 ft/sec with LRAD indicating improvement in functional mobility     Baseline  10/25: 1.7 ft/sec with SPC; 1.36 ft/sec with no AD    Time  8    Period  Weeks    Status  Revised      PT LONG TERM GOAL #4   Title  Pt will ambulate x 1000 over outdoor surfaces + curb (paved and grassy) MOD I with LRAD to improve functional mobility with community distances    Time  8    Period  Weeks    Status  New      PT LONG TERM GOAL #5   Title  Pt will negotiate 12 steps with alternating stepping MOD I using single HR and SPC to improve functional mobility     Time  8    Period  Weeks    Status  New      PT LONG TERM GOAL #6   Title  Pt will demonstrate an  FGA score of >/=19/30 to decrease fall risk potential with functional mobility using LRAD     Baseline  10/30    Time  8    Period  Weeks    Status  New            Plan - 02/06/18 1036    Clinical Impression Statement   Aquatic therapy focused on strengthening and stretching LLE, ballistic movements for coordination, and dynamic balance.  Pt demonstrates decreased trunk control as evidenced by some mild LOB with Ai Chi postures which require trunk stabilization.  Use of ankle flotation cuff weight used for increased buoyancy for Lt hip flexor and quad stretch.  LLE was trembling at end of session due to muscle fatigue.      Rehab Potential  Good    Clinical Impairments Affecting Rehab Potential  Pt currently reports pain and tightness to mid thoracic region near surgical incision which is limiting his functional mobility     PT Frequency  2x / week    PT Duration  8 weeks    PT Treatment/Interventions  ADLs/Self Care Home Management;Cryotherapy;Electrical Stimulation;Moist Heat;DME Instruction;Gait Scientist, forensic;Therapeutic activities;Functional mobility training;Therapeutic exercise;Balance training;Neuromuscular re-education;Patient/family education;Orthotic Fit/Training;Passive range of motion;Taping;Aquatic Therapy;Dry needling    PT Next Visit Plan  dry needling recert ?? L heel cord stretch *emphasize for home*, core stabilization, eccentric quads, phyiso ball core/ LE marching, standing with R LE on block performing UE movements with resistance to inc L LE WB, step ups, bioness with strengthening HEP, pulsed lounges, LAQ on machine at gym, L LE weight shifting, L LE strengthening, gait speed/treadmill + consider faster gait with forearm crutch in clinic, postural training, gentle ROM neck/shoulders.    Consulted and Agree with Plan of Care  Patient       Patient will benefit from skilled therapeutic intervention in order to improve the following deficits and impairments:  Abnormal gait, Decreased balance, Decreased coordination, Decreased strength, Difficulty walking, Increased muscle spasms, Impaired sensation, Pain, Impaired tone, Decreased range of motion  Visit Diagnosis: Other abnormalities of  gait and mobility  Unsteadiness  Muscle weakness (generalized)     Problem List Patient Active Problem List   Diagnosis Date Noted  . S/P lumbar laminectomy 12/27/2017  . Paraplegia ([facility]) 11/07/2017  . Quadriplegia, unspecified ([facility]) 11/07/2017  . Leukocytosis   . Neuropathic pain   . Drug induced constipation   . Urinary retention   . Class 1 obesity due to excess calories with serious comorbidity and body mass index (BMI) of 33.0 to 33.9 in adult   . Myelopathy ([facility]) 08/01/2017  . Thoracic spinal stenosis 07/31/2017    Alda Lea, PT 02/06/2018, 10:44 AM  [facility] [address] [place], [place], [address] Phone: [phone]   Fax:  [phone]  Name: TREYLON HENARD MRN: [id number] Date of Birth: 1986-02-12

## 2018-02-07 ENCOUNTER — Ambulatory Visit: Payer: 59 | Attending: Physical Medicine & Rehabilitation | Admitting: Physical Therapy

## 2018-02-07 ENCOUNTER — Encounter: Payer: Self-pay | Admitting: Physical Therapy

## 2018-02-07 DIAGNOSIS — R2689 Other abnormalities of gait and mobility: Secondary | ICD-10-CM | POA: Diagnosis not present

## 2018-02-07 DIAGNOSIS — R208 Other disturbances of skin sensation: Secondary | ICD-10-CM | POA: Diagnosis not present

## 2018-02-07 DIAGNOSIS — R29818 Other symptoms and signs involving the nervous system: Secondary | ICD-10-CM | POA: Diagnosis not present

## 2018-02-07 DIAGNOSIS — G8222 Paraplegia, incomplete: Secondary | ICD-10-CM | POA: Diagnosis not present

## 2018-02-07 DIAGNOSIS — R2681 Unsteadiness on feet: Secondary | ICD-10-CM | POA: Diagnosis not present

## 2018-02-07 DIAGNOSIS — M6281 Muscle weakness (generalized): Secondary | ICD-10-CM | POA: Insufficient documentation

## 2018-02-07 DIAGNOSIS — M546 Pain in thoracic spine: Secondary | ICD-10-CM | POA: Diagnosis not present

## 2018-02-07 NOTE — Therapy (Signed)
[facility] [address] [place], [place], [address] Phone: [phone]   Fax:  [phone]  Physical Therapy Treatment  Patient Details  Name: Scott Lane MRN: [id number] Date of Birth: [birth year] Referring Provider (PT): Sherley Bounds, MD   Encounter Date: 02/07/2018  PT End of Session - 02/07/18 1030    Visit Number  12    Number of Visits  17    Date for PT Re-Evaluation  03/04/18    Authorization Type  [employer] Deductible.  VL: [place]    PT Start Time  0800    PT Stop Time  0845    PT Time Calculation (min)  45 min    Equipment Utilized During Treatment  Gait belt    Activity Tolerance  Patient tolerated treatment well    Behavior During Therapy  WFL for tasks assessed/performed       Past Medical History:  Diagnosis Date  . Chicken pox     Past Surgical History:  Procedure Laterality Date  . LUMBAR LAMINECTOMY/DECOMPRESSION MICRODISCECTOMY N/A 08/01/2017   Procedure: Thoracic nine, ten, eleven, twelve laminectomy;  Surgeon: Eustace Moore, MD;  Location: [facility];  Service: Neurosurgery;  Laterality: N/A;  . none    . THORACIC DISCECTOMY N/A 12/27/2017   Procedure: Laminectomy and Foraminotomy - Thoracic one - Thoracic three;  Surgeon: Eustace Moore, MD;  Location: [facility];  Service: Neurosurgery;  Laterality: N/A;  . WISDOM TOOTH EXTRACTION     bilateral lower.    There were no vitals filed for this visit.  Subjective Assessment - 02/07/18 0804    Subjective  Everything has been going well and he enjoyed the pool workout. Continues to experience tightness to L hamstring and L TFL and continues to stretch to improve flexibility.     Patient is accompained by:  --   Wife T   Pertinent History  Patient enjoys maintaining a very active life style     Limitations  Standing;Walking;Lifting;Other (comment)    How long can you stand comfortably?  8-10 minutes    How long can you walk comfortably?  uses  cane and RW household distance, RW for community distances    Diagnostic tests  MRI     Patient Stated Goals  Ambulation with no AD to get back to prior level of function.     Currently in Pain?  No/denies    Pain Onset  1 to 4 weeks ago        [facility] Adult PT Treatment/Exercise - 02/07/18 1024      Balance   Balance Assessed  Yes      Dynamic Standing Balance   Dynamic Standing - Comments  Pt attempts to perform alternating LE cone tapping while standing on compliant surface but unable to perform without heavy reliance of UE's on // bars. Pt performs alternating cone tapping on level surface with B UE's in his pockets to reduce heavy reliance on UE's during L weightshift to off load R LE. Pt progresses to double tapping each cone for x10 reps.       Neuro Re-ed    Neuro Re-ed Details   Pt performs SLS with R LE standing on 4 inch block in // bars to increase L LE WB. Pt then performs chest press "push outs" with 5.5# to increase WB through L LE with dec reliance on // bars for assist. Pt then performs diagonal chops in both directions with 5.5# weight with  incorporation of trunk rotation. Therapist cues patient to perform slight L knee flexion during SLS to prevent locking out knee. Pt requires prolonged rest break in between trials 2/2 to fatigue. Pt performs all UE movements for x12 reps.       Exercises   Exercises  Knee/Hip      Knee/Hip Exercises: Stretches   Passive Hamstring Stretch  3 reps;30 seconds;Left    ITB Stretch  Left;3 reps;30 seconds             PT Education - 02/07/18 1029    Education provided  Yes    Education Details  Therapist provided education regarding implications and potential side effects of trigger point dry needling to L hamstring region. Therapist provided education for patient to follow up if any abnormal symptoms or side effects were to occur.     Person(s) Educated  Patient    Methods  Explanation    Comprehension  Verbalized understanding        PT Short Term Goals - 01/31/18 0949      PT SHORT TERM GOAL #1   Title  Pt will improve standing balance as indicated by increased BERG balance score to >/= 42/56  (ALL STG due by 02/02/18)    Baseline  10/25: 47/56 indicative of moderate fall risk potential     Time  4    Period  Weeks    Status  Achieved      PT SHORT TERM GOAL #2   Title  Pt will participate in FGA assessment to establish fall risk potential with dynamic functional mobility     Baseline  10/25: 11/30    Time  4    Period  Weeks    Status  Achieved      PT SHORT TERM GOAL #3   Title  Pt will improve gait speed to >/=2.6 ft/sec with LRAD indicating improvement with functional mobility placing him in the community ambulator category     Baseline  10/25: 1.70 ft/sec with SPC indicative of limited community ambulator; 1.36 ft/sec with no AD indicative of limited community ambulator     Time  4    Period  Weeks    Status  Not Met      PT SHORT TERM GOAL #4   Title  Pt will perform 12 steps with single HR and SPC with reciprocal stepping pattern and supervision-min A indicating improvement with functional mobility and LE strength     Baseline  10/25: 12 steps with reciprocal stepping and bilateral HR's with supervision     Time  4    Period  Weeks    Status  Partially Met      PT SHORT TERM GOAL #5   Title  Pt will be able to ambulate 500 ft in the community over uneven surfaces, grass, ramps, and curbs with supervision using LRAD for improvement in community ambulation distances    Baseline  10/25: ambulated 500 feet with SPC and supervision     Time  4    Period  Weeks    Status  Achieved        PT Long Term Goals - 01/31/18 6734      PT LONG TERM GOAL #1   Title  Pt will be independent with balance and LE strengthening HEP to further improve functional independence (ALL LTG due by 03/04/18)    Time  8    Period  Weeks    Status  New  PT LONG TERM GOAL #2   Title  Pt will improve standing  balance and BERG >/= 50/56 to decrease falls risk    Baseline  9/27 eval: 38/56    Time  8    Period  Weeks    Status  New      PT LONG TERM GOAL #3   Title  Pt will improve gait velocity to >/= 2.62 ft/sec with LRAD indicating improvement in functional mobility     Baseline  10/25: 1.7 ft/sec with SPC; 1.36 ft/sec with no AD    Time  8    Period  Weeks    Status  Revised      PT LONG TERM GOAL #4   Title  Pt will ambulate x 1000 over outdoor surfaces + curb (paved and grassy) MOD I with LRAD to improve functional mobility with community distances    Time  8    Period  Weeks    Status  New      PT LONG TERM GOAL #5   Title  Pt will negotiate 12 steps with alternating stepping MOD I using single HR and SPC to improve functional mobility     Time  8    Period  Weeks    Status  New      PT LONG TERM GOAL #6   Title  Pt will demonstrate an FGA score of >/=19/30 to decrease fall risk potential with functional mobility using LRAD     Baseline  10/30    Time  8    Period  Weeks    Status  New            Plan - 02/07/18 1030    Clinical Impression Statement  Today's session focused on trigger point dry needling to L hamstring region, LE stretching to improve flexibility, and L LE WB with incorporation of dynamic UE movements. Pt tolerated session well and reports reduction in tightness to L hamstring region s/p treatment and inquires about receiving trigger point dry needling to L TFL and L quad region to reduce tightness. Pt continues to demonstrate difficulty performing L WB requiring heavy reliance on UE's for support to maintain stability. Pt reports he is fearful of L knee buckling leading to possible subsequent fall episode. Pt will continue to benefit from skilled PT to address strength, balance, and functional mobility deficits.     Rehab Potential  Good    Clinical Impairments Affecting Rehab Potential  Pt currently reports pain and tightness to mid thoracic region near  surgical incision which is limiting his functional mobility     PT Frequency  2x / week    PT Duration  8 weeks    PT Treatment/Interventions  ADLs/Self Care Home Management;Cryotherapy;Electrical Stimulation;Moist Heat;DME Instruction;Gait Scientist, forensic;Therapeutic activities;Functional mobility training;Therapeutic exercise;Balance training;Neuromuscular re-education;Patient/family education;Orthotic Fit/Training;Passive range of motion;Taping;Aquatic Therapy;Dry needling    PT Next Visit Plan  Thiago Ragsdale would like the aquatic appt on 11/6 at 3 PM. dry needling to L quad/ TFL,  L heel cord stretch *emphasize for home*, core stabilization, eccentric quads, phyiso ball core/ LE marching, standing with R LE on block performing UE movements with resistance to inc L LE WB, step ups, bioness with strengthening HEP, pulsed lounges, LAQ on machine at gym, L LE weight shifting, L LE strengthening, gait speed/treadmill + consider faster gait with forearm crutch in clinic, postural training, gentle ROM neck/shoulders.    Consulted and Agree with Plan of Care  Patient  Patient will benefit from skilled therapeutic intervention in order to improve the following deficits and impairments:  Abnormal gait, Decreased balance, Decreased coordination, Decreased strength, Difficulty walking, Increased muscle spasms, Impaired sensation, Pain, Impaired tone, Decreased range of motion  Visit Diagnosis: Unsteadiness  Muscle weakness (generalized)  Other abnormalities of gait and mobility     Problem List Patient Active Problem List   Diagnosis Date Noted  . S/P lumbar laminectomy 12/27/2017  . Paraplegia ([facility]) 11/07/2017  . Quadriplegia, unspecified ([facility]) 11/07/2017  . Leukocytosis   . Neuropathic pain   . Drug induced constipation   . Urinary retention   . Class 1 obesity due to excess calories with serious comorbidity and body mass index (BMI) of 33.0 to 33.9 in adult   . Myelopathy  ([facility]) 08/01/2017  . Thoracic spinal stenosis 07/31/2017    Floreen Comber, SPT 02/07/2018, 10:35 AM  [facility] [address] [place], [place], [address] Phone: [phone]   Fax:  [phone]  Name: AXZEL ROCKHILL MRN: [id number] Date of Birth: 1985/05/01

## 2018-02-10 ENCOUNTER — Encounter: Payer: Self-pay | Admitting: Physical Therapy

## 2018-02-10 ENCOUNTER — Ambulatory Visit: Payer: 59 | Admitting: Physical Therapy

## 2018-02-10 DIAGNOSIS — R208 Other disturbances of skin sensation: Secondary | ICD-10-CM | POA: Diagnosis not present

## 2018-02-10 DIAGNOSIS — M6281 Muscle weakness (generalized): Secondary | ICD-10-CM

## 2018-02-10 DIAGNOSIS — R2689 Other abnormalities of gait and mobility: Secondary | ICD-10-CM

## 2018-02-10 DIAGNOSIS — M546 Pain in thoracic spine: Secondary | ICD-10-CM | POA: Diagnosis not present

## 2018-02-10 DIAGNOSIS — R29818 Other symptoms and signs involving the nervous system: Secondary | ICD-10-CM | POA: Diagnosis not present

## 2018-02-10 DIAGNOSIS — R2681 Unsteadiness on feet: Secondary | ICD-10-CM | POA: Diagnosis not present

## 2018-02-10 DIAGNOSIS — G8222 Paraplegia, incomplete: Secondary | ICD-10-CM | POA: Diagnosis not present

## 2018-02-10 NOTE — Therapy (Signed)
[facility] [address] [place], [place], [address] Phone: [phone]   Fax:  [phone]  Physical Therapy Treatment  Patient Details  Name: Scott Lane MRN: [id number] Date of Birth: 04-28-1985 Referring Provider (PT): Sherley Bounds, MD   Encounter Date: 02/10/2018  PT End of Session - 02/10/18 1126    Visit Number  13    Number of Visits  17    Date for PT Re-Evaluation  03/04/18    Authorization Type  [place] Family Deductible.  VL: MN    PT Start Time  0845    PT Stop Time  0930    PT Time Calculation (min)  45 min    Equipment Utilized During Treatment  Gait belt    Activity Tolerance  Patient tolerated treatment well    Behavior During Therapy  WFL for tasks assessed/performed       Past Medical History:  Diagnosis Date  . Chicken pox     Past Surgical History:  Procedure Laterality Date  . LUMBAR LAMINECTOMY/DECOMPRESSION MICRODISCECTOMY N/A 08/01/2017   Procedure: Thoracic nine, ten, eleven, twelve laminectomy;  Surgeon: Eustace Moore, MD;  Location: [facility];  Service: Neurosurgery;  Laterality: N/A;  . none    . THORACIC DISCECTOMY N/A 12/27/2017   Procedure: Laminectomy and Foraminotomy - Thoracic one - Thoracic three;  Surgeon: Eustace Moore, MD;  Location: [facility];  Service: Neurosurgery;  Laterality: N/A;  . WISDOM TOOTH EXTRACTION     bilateral lower.    There were no vitals filed for this visit.  Subjective Assessment - 02/10/18 0851    Subjective  Pt reports everything has been going well with no change in status. Patient points to L anterior knee with complaint of tightness to region with no pain. Continues to perform stretches at home and indicates reduction in L hamstring tightness following previous dry needling treatment.     Patient is accompained by:  --   Wife T   Pertinent History  Patient enjoys maintaining a very active life style     Limitations   Standing;Walking;Lifting;Other (comment)    How long can you stand comfortably?  8-10 minutes    How long can you walk comfortably?  uses cane and RW household distance, RW for community distances    Diagnostic tests  MRI     Patient Stated Goals  Ambulation with no AD to get back to prior level of function.     Currently in Pain?  No/denies    Pain Onset  --          [facility] Adult PT Treatment/Exercise - 02/10/18 1117      Ambulation/Gait   Ambulation/Gait  Yes    Ambulation/Gait Assistance  4: Min assist    Ambulation/Gait Assistance Details  Pt performs x2 ambulation trials on level surface with no AD and therapist providing min A for safety consideration. Pt demonstrates dec L LE weight shift and increased activation of L peroneals during swing phase creating excessive eversion. Pt maintains UE's in mid guard and demonstrates x1 LOB episode 2/2 to his toe catching with delayed stepping response requiring therapist to steady patient.     Ambulation Distance (Feet)  115 Feet   x2 trials   Assistive device  None    Gait Pattern  Step-through pattern;Decreased stride length;Decreased hip/knee flexion - right;Decreased hip/knee flexion - left;Decreased weight shift to left;Decreased trunk rotation;Wide base of support;Decreased stance time - left;Decreased step length - right;Decreased  arm swing - left    Ambulation Surface  Level;Indoor      Exercises   Exercises  Knee/Hip      Knee/Hip Exercises: Standing   Wall Squat  Other (comment);3 sets   8 reps    Wall Squat Limitations  Pt performs 1x8 reps of wall squats with bioness upper thigh cuff providing electrical stimulation to L quad during exercise. Pt progresses to 1x8 reps with R heel lift to promote L LE weight shift. Pt then performs 1x8 reps with R LE on 2 inch block with therapist cueing to shift pelvis to his L LE to increase L LE WB. Pt requires prolonged rest break in between sets 2/2 to fatigue with fear of experiencing a knee  buckling moment.       Modalities   Modalities  Teacher, English as a foreign language Location  Bioness upper and lower thigh cuff applied to L LE. Only L upper thigh cuff utilized for quad activation     Electrical Stimulation Action  closed and open chain knee extension    Electrical Stimulation Parameters  see parameters on tablet 2. Therapist utilized quick fit electrodes when attempting to elicit DF; however, ,may need to assess with steering electrodes 2/2 to excessive eversion noted    Electrical Stimulation Goals  Strength;Neuromuscular facilitation             PT Education - 02/10/18 1125    Education provided  Yes    Education Details  Therapist provided education regarding the possibility for TPDN to L LE peroneals 2/2 to increased tightness during palpation. Therapist educated patient to begin walking in clinic with no AD to progress towards LTG's.     Person(s) Educated  Patient    Methods  Explanation    Comprehension  Verbalized understanding       PT Short Term Goals - 01/31/18 0949      PT SHORT TERM GOAL #1   Title  Pt will improve standing balance as indicated by increased BERG balance score to >/= 42/56  (ALL STG due by 02/02/18)    Baseline  10/25: 47/56 indicative of moderate fall risk potential     Time  4    Period  Weeks    Status  Achieved      PT SHORT TERM GOAL #2   Title  Pt will participate in FGA assessment to establish fall risk potential with dynamic functional mobility     Baseline  10/25: 11/30    Time  4    Period  Weeks    Status  Achieved      PT SHORT TERM GOAL #3   Title  Pt will improve gait speed to >/=2.6 ft/sec with LRAD indicating improvement with functional mobility placing him in the community ambulator category     Baseline  10/25: 1.70 ft/sec with SPC indicative of limited community ambulator; 1.36 ft/sec with no AD indicative of limited community ambulator     Time  4    Period   Weeks    Status  Not Met      PT SHORT TERM GOAL #4   Title  Pt will perform 12 steps with single HR and SPC with reciprocal stepping pattern and supervision-min A indicating improvement with functional mobility and LE strength     Baseline  10/25: 12 steps with reciprocal stepping and bilateral HR's with supervision     Time  4  Period  Weeks    Status  Partially Met      PT SHORT TERM GOAL #5   Title  Pt will be able to ambulate 500 ft in the community over uneven surfaces, grass, ramps, and curbs with supervision using LRAD for improvement in community ambulation distances    Baseline  10/25: ambulated 500 feet with SPC and supervision     Time  4    Period  Weeks    Status  Achieved        PT Long Term Goals - 01/31/18 8250      PT LONG TERM GOAL #1   Title  Pt will be independent with balance and LE strengthening HEP to further improve functional independence (ALL LTG due by 03/04/18)    Time  8    Period  Weeks    Status  New      PT LONG TERM GOAL #2   Title  Pt will improve standing balance and BERG >/= 50/56 to decrease falls risk    Baseline  9/27 eval: 38/56    Time  8    Period  Weeks    Status  New      PT LONG TERM GOAL #3   Title  Pt will improve gait velocity to >/= 2.62 ft/sec with LRAD indicating improvement in functional mobility     Baseline  10/25: 1.7 ft/sec with SPC; 1.36 ft/sec with no AD    Time  8    Period  Weeks    Status  Revised      PT LONG TERM GOAL #4   Title  Pt will ambulate x 1000 over outdoor surfaces + curb (paved and grassy) MOD I with LRAD to improve functional mobility with community distances    Time  8    Period  Weeks    Status  New      PT LONG TERM GOAL #5   Title  Pt will negotiate 12 steps with alternating stepping MOD I using single HR and SPC to improve functional mobility     Time  8    Period  Weeks    Status  New      PT LONG TERM GOAL #6   Title  Pt will demonstrate an FGA score of >/=19/30 to decrease fall  risk potential with functional mobility using LRAD     Baseline  10/30    Time  8    Period  Weeks    Status  New            Plan - 02/10/18 0926    Clinical Impression Statement  Skilled session focused on LE strengthening and gait with no AD with bioness upper thigh cuff providing stimulation to L quad for NMR. Pt tolerated session well and was able to progress wall squats to elicit increased L LE WB for strengthening. Pt requires prolonged rest breaks in between sets 2/2 to quick fatigue onset. Pt will continue to benefit from bioness stimulation to optimize gait sequence and promote increased safety with functional mobility.  Will return to use of steering electrode with Bioness to improve activation of anterior tibialis due to patient over use of fibularis muscles causing increased supination and eversion with ankle DF.  Pt may also benefit from TDN of this muscle group.  Pt will continue to benefit from skilled PT to address LE strength, balance, and functional mobility deficits.     Rehab Potential  Good  Clinical Impairments Affecting Rehab Potential  Pt currently reports pain and tightness to mid thoracic region near surgical incision which is limiting his functional mobility     PT Frequency  2x / week    PT Duration  8 weeks    PT Treatment/Interventions  ADLs/Self Care Home Management;Cryotherapy;Electrical Stimulation;Moist Heat;DME Instruction;Gait Scientist, forensic;Therapeutic activities;Functional mobility training;Therapeutic exercise;Balance training;Neuromuscular re-education;Patient/family education;Orthotic Fit/Training;Passive range of motion;Taping;Aquatic Therapy;Dry needling    PT Next Visit Plan  Friday - Dry needle - L quad and L fibularis longus/brevis (he tends to over supinate and evert ankle) & L TFL. gait without cane, increase weight shift and WB through LLE, Bioness L quad or hamstring, L anterior tib with steering electrode, core stabilization, eccentric  quads, phyiso ball core/ LE marching, standing with R LE on block performing UE movements with resistance to inc L LE WB, step ups, bioness with strengthening HEP, pulsed lounges, LAQ on machine at gym, L LE weight shifting, L LE strengthening, gait speed/treadmill + consider faster gait with forearm crutch in clinic, postural training, gentle ROM neck/shoulders.    Consulted and Agree with Plan of Care  Patient       Patient will benefit from skilled therapeutic intervention in order to improve the following deficits and impairments:  Abnormal gait, Decreased balance, Decreased coordination, Decreased strength, Difficulty walking, Increased muscle spasms, Impaired sensation, Pain, Impaired tone, Decreased range of motion  Visit Diagnosis: Muscle weakness (generalized)  Other abnormalities of gait and mobility     Problem List Patient Active Problem List   Diagnosis Date Noted  . S/P lumbar laminectomy 12/27/2017  . Paraplegia ([facility]) 11/07/2017  . Quadriplegia, unspecified ([facility]) 11/07/2017  . Leukocytosis   . Neuropathic pain   . Drug induced constipation   . Urinary retention   . Class 1 obesity due to excess calories with serious comorbidity and body mass index (BMI) of 33.0 to 33.9 in adult   . Myelopathy ([facility]) 08/01/2017  . Thoracic spinal stenosis 07/31/2017    Floreen Comber, SPT 02/10/2018, 11:30 AM  [facility] [address] [place], [place], [address] Phone: [phone]   Fax:  [phone]  Name: Scott Lane MRN: [id number] Date of Birth: 10/26/1985

## 2018-02-12 ENCOUNTER — Ambulatory Visit: Payer: 59 | Admitting: Physical Therapy

## 2018-02-12 DIAGNOSIS — M546 Pain in thoracic spine: Secondary | ICD-10-CM | POA: Diagnosis not present

## 2018-02-12 DIAGNOSIS — R2689 Other abnormalities of gait and mobility: Secondary | ICD-10-CM | POA: Diagnosis not present

## 2018-02-12 DIAGNOSIS — R2681 Unsteadiness on feet: Secondary | ICD-10-CM

## 2018-02-12 DIAGNOSIS — M6281 Muscle weakness (generalized): Secondary | ICD-10-CM

## 2018-02-12 DIAGNOSIS — G8222 Paraplegia, incomplete: Secondary | ICD-10-CM | POA: Diagnosis not present

## 2018-02-12 DIAGNOSIS — R29818 Other symptoms and signs involving the nervous system: Secondary | ICD-10-CM | POA: Diagnosis not present

## 2018-02-12 DIAGNOSIS — R208 Other disturbances of skin sensation: Secondary | ICD-10-CM | POA: Diagnosis not present

## 2018-02-12 NOTE — Therapy (Signed)
[facility] [address] [place], [place], [address] Phone: [phone]   Fax:  [phone]  Physical Therapy Treatment  Patient Details  Name: Scott Lane MRN: [id number] Date of Birth: Aug 14, 1985 Referring Provider (PT): Sherley Bounds, MD   Encounter Date: 02/12/2018  PT End of Session - 02/12/18 2000    Visit Number  14    Number of Visits  17    Date for PT Re-Evaluation  03/04/18    Authorization Type  [employer] Deductible.  VL: MN    PT Start Time  1500    PT Stop Time  1545    PT Time Calculation (min)  45 min       Past Medical History:  Diagnosis Date  . Chicken pox     Past Surgical History:  Procedure Laterality Date  . LUMBAR LAMINECTOMY/DECOMPRESSION MICRODISCECTOMY N/A 08/01/2017   Procedure: Thoracic nine, ten, eleven, twelve laminectomy;  Surgeon: Eustace Moore, MD;  Location: [facility];  Service: Neurosurgery;  Laterality: N/A;  . none    . THORACIC DISCECTOMY N/A 12/27/2017   Procedure: Laminectomy and Foraminotomy - Thoracic one - Thoracic three;  Surgeon: Eustace Moore, MD;  Location: [facility];  Service: Neurosurgery;  Laterality: N/A;  . WISDOM TOOTH EXTRACTION     bilateral lower.    There were no vitals filed for this visit.  Subjective Assessment - 02/12/18 1956    Subjective  Pt reports LLE felt really good after last aquatic therapy session last week    Pertinent History  Patient enjoys maintaining a very active life style     Limitations  Standing;Walking;Lifting;Other (comment)    Patient Stated Goals  Ambulation with no AD to get back to prior level of function.     Currently in Pain?  No/denies       Aquatic therapy - pool temp 87.2 degrees  Patient seen for aquatic therapy today.  Treatment took place in water 3.5-4 feet deep depending upon activity.  Pt entered and exited  the pool via step negotiation with use of hand rails (modified independently).  LLE stretches  - runner's stretch x 30 sec hold and gastroc stretch with bil. forefeet on pool wall - 30 sec hold x 1 rep Pt performed LLE strengthening with use of ankle cuff flotation weight - hip flexion, abduction and extension 10 reps each;  Lt hip flexion/extension with knee flexed at 90 degrees with UE support on noodle for assistance with balance  Pt performed marching in place  - slowly - for improved SLS on each leg 10 reps; 10 reps quickly to use viscosity of water for resistance and challenge with standing balance;  Pt performed resisted amb. With use of resistive tubing for added resistance and perturbations with dynamic balance - resisted amb. Forward, back and  side 35' x 2 reps each  Pt performed Lt iliotibial band stretch in standing with LLE behind RLE - pt holding with Rt hand on pool edge; Lt hip abducted and shifted left with upper body leaned toward Rt side  Pt performed ballistic movements - jumping, cross country skiing with use of bar bells for added resistance in water; jumping jacks with UE movement for increased  challenge with balance Pt performed Lt SLS activity - touching different colors of blue on pool floor with RLE - 10 reps   Pt requires buoyancy of water for off loading and for support with high level balance activities and ballistic  movements                         PT Short Term Goals - 01/31/18 0949      PT SHORT TERM GOAL #1   Title  Pt will improve standing balance as indicated by increased BERG balance score to >/= 42/56  (ALL STG due by 02/02/18)    Baseline  10/25: 47/56 indicative of moderate fall risk potential     Time  4    Period  Weeks    Status  Achieved      PT SHORT TERM GOAL #2   Title  Pt will participate in FGA assessment to establish fall risk potential with dynamic functional mobility     Baseline  10/25: 11/30    Time  4    Period  Weeks    Status  Achieved      PT SHORT TERM GOAL #3   Title  Pt will improve gait  speed to >/=2.6 ft/sec with LRAD indicating improvement with functional mobility placing him in the community ambulator category     Baseline  10/25: 1.70 ft/sec with SPC indicative of limited community ambulator; 1.36 ft/sec with no AD indicative of limited community ambulator     Time  4    Period  Weeks    Status  Not Met      PT SHORT TERM GOAL #4   Title  Pt will perform 12 steps with single HR and SPC with reciprocal stepping pattern and supervision-min A indicating improvement with functional mobility and LE strength     Baseline  10/25: 12 steps with reciprocal stepping and bilateral HR's with supervision     Time  4    Period  Weeks    Status  Partially Met      PT SHORT TERM GOAL #5   Title  Pt will be able to ambulate 500 ft in the community over uneven surfaces, grass, ramps, and curbs with supervision using LRAD for improvement in community ambulation distances    Baseline  10/25: ambulated 500 feet with SPC and supervision     Time  4    Period  Weeks    Status  Achieved        PT Long Term Goals - 01/31/18 5974      PT LONG TERM GOAL #1   Title  Pt will be independent with balance and LE strengthening HEP to further improve functional independence (ALL LTG due by 03/04/18)    Time  8    Period  Weeks    Status  New      PT LONG TERM GOAL #2   Title  Pt will improve standing balance and BERG >/= 50/56 to decrease falls risk    Baseline  9/27 eval: 38/56    Time  8    Period  Weeks    Status  New      PT LONG TERM GOAL #3   Title  Pt will improve gait velocity to >/= 2.62 ft/sec with LRAD indicating improvement in functional mobility     Baseline  10/25: 1.7 ft/sec with SPC; 1.36 ft/sec with no AD    Time  8    Period  Weeks    Status  Revised      PT LONG TERM GOAL #4   Title  Pt will ambulate x 1000 over outdoor surfaces + curb (paved and grassy) MOD I with  LRAD to improve functional mobility with community distances    Time  8    Period  Weeks     Status  New      PT LONG TERM GOAL #5   Title  Pt will negotiate 12 steps with alternating stepping MOD I using single HR and SPC to improve functional mobility     Time  8    Period  Weeks    Status  New      PT LONG TERM GOAL #6   Title  Pt will demonstrate an FGA score of >/=19/30 to decrease fall risk potential with functional mobility using LRAD     Baseline  10/30    Time  8    Period  Weeks    Status  New            Plan - 02/12/18 2001    Clinical Impression Statement  Pt improving with balance and LLE strength with less rest breaks needed during session today; also, LLE did not tremor due to decreased muscle strength and endurance as it tremored in last week's session.  Pt's balance and coordination improving with ballistic movements in the pool.     Pt reported moderate stretching of Lt IT band in pool with standing stretch    Clinical Impairments Affecting Rehab Potential  Pt currently reports pain and tightness to mid thoracic region near surgical incision which is limiting his functional mobility     PT Frequency  2x / week    PT Duration  8 weeks    PT Treatment/Interventions  ADLs/Self Care Home Management;Cryotherapy;Electrical Stimulation;Moist Heat;DME Instruction;Gait Scientist, forensic;Therapeutic activities;Functional mobility training;Therapeutic exercise;Balance training;Neuromuscular re-education;Patient/family education;Orthotic Fit/Training;Passive range of motion;Taping;Aquatic Therapy;Dry needling    PT Next Visit Plan  Friday - Dry needle - L quad and L fibularis longus/brevis (he tends to over supinate and evert ankle) & L TFL. gait without cane, increase weight shift and WB through LLE, Bioness L quad or hamstring, L anterior tib with steering electrode, core stabilization, eccentric quads, phyiso ball core/ LE marching, standing with R LE on block performing UE movements with resistance to inc L LE WB, step ups, bioness with strengthening HEP, pulsed  lounges, LAQ on machine at gym, L LE weight shifting, L LE strengthening, gait speed/treadmill + consider faster gait with forearm crutch in clinic, postural training, gentle ROM neck/shoulders.       Patient will benefit from skilled therapeutic intervention in order to improve the following deficits and impairments:  Abnormal gait, Decreased balance, Decreased coordination, Decreased strength, Difficulty walking, Increased muscle spasms, Impaired sensation, Pain, Impaired tone, Decreased range of motion  Visit Diagnosis: Muscle weakness (generalized)  Other abnormalities of gait and mobility  Unsteadiness     Problem List Patient Active Problem List   Diagnosis Date Noted  . S/P lumbar laminectomy 12/27/2017  . Paraplegia ([facility]) 11/07/2017  . Quadriplegia, unspecified ([facility]) 11/07/2017  . Leukocytosis   . Neuropathic pain   . Drug induced constipation   . Urinary retention   . Class 1 obesity due to excess calories with serious comorbidity and body mass index (BMI) of 33.0 to 33.9 in adult   . Myelopathy ([facility]) 08/01/2017  . Thoracic spinal stenosis 07/31/2017    Alda Lea, PT 02/12/2018, 8:26 PM  [facility] [address] [place], [place], [address] Phone: [phone]   Fax:  [phone]  Name: MIHCAEL LEDEE MRN: [id number] Date of Birth: [birth year]

## 2018-02-13 ENCOUNTER — Ambulatory Visit: Payer: 59 | Admitting: Rehabilitative and Restorative Service Providers"

## 2018-02-14 ENCOUNTER — Encounter: Payer: Self-pay | Admitting: Physical Therapy

## 2018-02-14 ENCOUNTER — Ambulatory Visit: Payer: 59 | Admitting: Physical Therapy

## 2018-02-14 DIAGNOSIS — R29818 Other symptoms and signs involving the nervous system: Secondary | ICD-10-CM | POA: Diagnosis not present

## 2018-02-14 DIAGNOSIS — R2689 Other abnormalities of gait and mobility: Secondary | ICD-10-CM

## 2018-02-14 DIAGNOSIS — G8222 Paraplegia, incomplete: Secondary | ICD-10-CM | POA: Diagnosis not present

## 2018-02-14 DIAGNOSIS — M6281 Muscle weakness (generalized): Secondary | ICD-10-CM

## 2018-02-14 DIAGNOSIS — R2681 Unsteadiness on feet: Secondary | ICD-10-CM

## 2018-02-14 DIAGNOSIS — R208 Other disturbances of skin sensation: Secondary | ICD-10-CM | POA: Diagnosis not present

## 2018-02-14 DIAGNOSIS — M546 Pain in thoracic spine: Secondary | ICD-10-CM | POA: Diagnosis not present

## 2018-02-14 NOTE — Therapy (Signed)
[facility] [address] [place], [place], [address] Phone: [phone]   Fax:  [phone]  Physical Therapy Treatment  Patient Details  Name: Scott Lane MRN: [id number] Date of Birth: [birth year] Referring Provider (PT): Sherley Bounds, MD   Encounter Date: 02/14/2018  PT End of Session - 02/14/18 1013    Visit Number  15    Number of Visits  17    Date for PT Re-Evaluation  03/04/18    Authorization Type  [place] Family Deductible.  VL: MN    PT Start Time  0806    PT Stop Time  0845    PT Time Calculation (min)  39 min    Activity Tolerance  Patient tolerated treatment well    Behavior During Therapy  WFL for tasks assessed/performed       Past Medical History:  Diagnosis Date  . Chicken pox     Past Surgical History:  Procedure Laterality Date  . LUMBAR LAMINECTOMY/DECOMPRESSION MICRODISCECTOMY N/A 08/01/2017   Procedure: Thoracic nine, ten, eleven, twelve laminectomy;  Surgeon: Eustace Moore, MD;  Location: [facility];  Service: Neurosurgery;  Laterality: N/A;  . none    . THORACIC DISCECTOMY N/A 12/27/2017   Procedure: Laminectomy and Foraminotomy - Thoracic one - Thoracic three;  Surgeon: Eustace Moore, MD;  Location: [employer] [facility];  Service: Neurosurgery;  Laterality: N/A;  . WISDOM TOOTH EXTRACTION     bilateral lower.    There were no vitals filed for this visit.  Subjective Assessment - 02/14/18 1012    Subjective  doing well - feels good after pool therapy; having some tightness in quad and L lower leg    Pertinent History  Patient enjoys maintaining a very active life style     Patient Stated Goals  Ambulation with no AD to get back to prior level of function.     Currently in Pain?  No/denies    Pain Score  0-No pain                       [facility] Adult PT Treatment/Exercise - 02/14/18 0001      Exercises   Exercises  Knee/Hip      Knee/Hip Exercises: Standing   Wall Squat  1  set;10 reps    Wall Squat Limitations  cueing for equal weight shift      Knee/Hip Exercises: Supine   Bridges  Strengthening;Both;2 sets;10 reps    Bridges Limitations  1st set with LE extended on physioball; 2nd set with physioball and HS curl     Straight Leg Raises  Left;10 reps    Straight Leg Raise with External Rotation  Left;10 reps      Manual Therapy   Manual Therapy  Soft tissue mobilization;Myofascial release    Manual therapy comments  skilled palpation with TDN    Soft tissue mobilization  STM to L quad and L posterior/lateral calf following DN treatment to facilitate muscle relaxation and reduced tightness/soreness    Myofascial Release  manual trigger point release to L quad and L posterior thigh; education on use of pipe/marshmallow stick/rolling pin to these arease for depper massage/sef myofascial release       Trigger Point Dry Needling - 02/14/18 1026    Consent Given?  Yes    Muscles Treated Lower Body  Quadriceps;Soleus    Quadriceps Response  Twitch response elicited;Palpable increased muscle length    Soleus Response  Twitch response  elicited;Palpable increased muscle length             PT Short Term Goals - 01/31/18 0949      PT SHORT TERM GOAL #1   Title  Pt will improve standing balance as indicated by increased BERG balance score to >/= 42/56  (ALL STG due by 02/02/18)    Baseline  10/25: 47/56 indicative of moderate fall risk potential     Time  4    Period  Weeks    Status  Achieved      PT SHORT TERM GOAL #2   Title  Pt will participate in FGA assessment to establish fall risk potential with dynamic functional mobility     Baseline  10/25: 11/30    Time  4    Period  Weeks    Status  Achieved      PT SHORT TERM GOAL #3   Title  Pt will improve gait speed to >/=2.6 ft/sec with LRAD indicating improvement with functional mobility placing him in the community ambulator category     Baseline  10/25: 1.70 ft/sec with SPC indicative of  limited community ambulator; 1.36 ft/sec with no AD indicative of limited community ambulator     Time  4    Period  Weeks    Status  Not Met      PT SHORT TERM GOAL #4   Title  Pt will perform 12 steps with single HR and SPC with reciprocal stepping pattern and supervision-min A indicating improvement with functional mobility and LE strength     Baseline  10/25: 12 steps with reciprocal stepping and bilateral HR's with supervision     Time  4    Period  Weeks    Status  Partially Met      PT SHORT TERM GOAL #5   Title  Pt will be able to ambulate 500 ft in the community over uneven surfaces, grass, ramps, and curbs with supervision using LRAD for improvement in community ambulation distances    Baseline  10/25: ambulated 500 feet with SPC and supervision     Time  4    Period  Weeks    Status  Achieved        PT Long Term Goals - 01/31/18 8938      PT LONG TERM GOAL #1   Title  Pt will be independent with balance and LE strengthening HEP to further improve functional independence (ALL LTG due by 03/04/18)    Time  8    Period  Weeks    Status  New      PT LONG TERM GOAL #2   Title  Pt will improve standing balance and BERG >/= 50/56 to decrease falls risk    Baseline  9/27 eval: 38/56    Time  8    Period  Weeks    Status  New      PT LONG TERM GOAL #3   Title  Pt will improve gait velocity to >/= 2.62 ft/sec with LRAD indicating improvement in functional mobility     Baseline  10/25: 1.7 ft/sec with SPC; 1.36 ft/sec with no AD    Time  8    Period  Weeks    Status  Revised      PT LONG TERM GOAL #4   Title  Pt will ambulate x 1000 over outdoor surfaces + curb (paved and grassy) MOD I with LRAD to improve functional mobility with community distances  Time  8    Period  Weeks    Status  New      PT LONG TERM GOAL #5   Title  Pt will negotiate 12 steps with alternating stepping MOD I using single HR and SPC to improve functional mobility     Time  8    Period   Weeks    Status  New      PT LONG TERM GOAL #6   Title  Pt will demonstrate an FGA score of >/=19/30 to decrease fall risk potential with functional mobility using LRAD     Baseline  10/30    Time  8    Period  Weeks    Status  New            Plan - 02/14/18 1030    Clinical Impression Statement  Jeremey doing well this morning. Reports soreness and tightness at L quad and posterior calf. Noted trigger points at quad (rectus femoris and VL as well as at soleus). TDN to these areas with good twitch repsonse and improved tension following treatment. Patient tolerable to all manaul therapy and ther ex following treatment with cueing provided during ther ex for equal weight shift and general muscle control. Making good progress.     Clinical Impairments Affecting Rehab Potential  Pt currently reports pain and tightness to mid thoracic region near surgical incision which is limiting his functional mobility     PT Frequency  2x / week    PT Duration  8 weeks    PT Treatment/Interventions  ADLs/Self Care Home Management;Cryotherapy;Electrical Stimulation;Moist Heat;DME Instruction;Gait Scientist, forensic;Therapeutic activities;Functional mobility training;Therapeutic exercise;Balance training;Neuromuscular re-education;Patient/family education;Orthotic Fit/Training;Passive range of motion;Taping;Aquatic Therapy;Dry needling    PT Next Visit Plan   gait without cane, increase weight shift and WB through LLE, Bioness L quad or hamstring, L anterior tib with steering electrode, core stabilization, eccentric quads, phyiso ball core/ LE marching, standing with R LE on block performing UE movements with resistance to inc L LE WB, step ups, bioness with strengthening HEP, pulsed lounges, LAQ on machine at gym, L LE weight shifting, L LE strengthening, gait speed/treadmill + consider faster gait with forearm crutch in clinic, postural training, gentle ROM neck/shoulders.    Consulted and Agree with Plan of  Care  Patient       Patient will benefit from skilled therapeutic intervention in order to improve the following deficits and impairments:  Abnormal gait, Decreased balance, Decreased coordination, Decreased strength, Difficulty walking, Increased muscle spasms, Impaired sensation, Pain, Impaired tone, Decreased range of motion  Visit Diagnosis: Muscle weakness (generalized)  Other abnormalities of gait and mobility  Unsteadiness     Problem List Patient Active Problem List   Diagnosis Date Noted  . S/P lumbar laminectomy 12/27/2017  . Paraplegia ([facility]) 11/07/2017  . Quadriplegia, unspecified ([facility]) 11/07/2017  . Leukocytosis   . Neuropathic pain   . Drug induced constipation   . Urinary retention   . Class 1 obesity due to excess calories with serious comorbidity and body mass index (BMI) of 33.0 to 33.9 in adult   . Myelopathy ([facility]) 08/01/2017  . Thoracic spinal stenosis 07/31/2017    Lanney Gins, PT, DPT Supplemental Physical Therapist 02/14/18 10:34 AM Pager: [phone] Office: [facility] [address] [place], [place], [address] Phone: [phone]   Fax:  [phone]  Name: Scott Lane MRN: [id number] Date of Birth: 12-15-1985

## 2018-02-17 ENCOUNTER — Encounter: Payer: Self-pay | Admitting: Physical Therapy

## 2018-02-17 ENCOUNTER — Ambulatory Visit: Payer: 59 | Admitting: Physical Therapy

## 2018-02-17 DIAGNOSIS — M546 Pain in thoracic spine: Secondary | ICD-10-CM

## 2018-02-17 DIAGNOSIS — G8222 Paraplegia, incomplete: Secondary | ICD-10-CM | POA: Diagnosis not present

## 2018-02-17 DIAGNOSIS — R2681 Unsteadiness on feet: Secondary | ICD-10-CM | POA: Diagnosis not present

## 2018-02-17 DIAGNOSIS — R208 Other disturbances of skin sensation: Secondary | ICD-10-CM

## 2018-02-17 DIAGNOSIS — M6281 Muscle weakness (generalized): Secondary | ICD-10-CM | POA: Diagnosis not present

## 2018-02-17 DIAGNOSIS — R2689 Other abnormalities of gait and mobility: Secondary | ICD-10-CM | POA: Diagnosis not present

## 2018-02-17 DIAGNOSIS — R29818 Other symptoms and signs involving the nervous system: Secondary | ICD-10-CM

## 2018-02-17 NOTE — Therapy (Addendum)
[facility] [address] [place], [place], [address] Phone: [phone]   Fax:  [phone]  Physical Therapy Treatment  Patient Details  Name: Scott Lane MRN: [id number] Date of Birth: October 08, 1985 Referring Provider (PT): Sherley Bounds, MD   Encounter Date: 02/17/2018  PT End of Session - 02/17/18 1322    Visit Number  16    Number of Visits  17    Date for PT Re-Evaluation  03/04/18    Authorization Type  [place] [employer] Deductible.  VL: MN    PT Start Time  0805    PT Stop Time  0845    PT Time Calculation (min)  40 min    Equipment Utilized During Treatment  Gait belt    Activity Tolerance  Patient tolerated treatment well    Behavior During Therapy  WFL for tasks assessed/performed       Past Medical History:  Diagnosis Date  . Chicken pox     Past Surgical History:  Procedure Laterality Date  . LUMBAR LAMINECTOMY/DECOMPRESSION MICRODISCECTOMY N/A 08/01/2017   Procedure: Thoracic nine, ten, eleven, twelve laminectomy;  Surgeon: Eustace Moore, MD;  Location: [facility];  Service: Neurosurgery;  Laterality: N/A;  . none    . THORACIC DISCECTOMY N/A 12/27/2017   Procedure: Laminectomy and Foraminotomy - Thoracic one - Thoracic three;  Surgeon: Eustace Moore, MD;  Location: [facility];  Service: Neurosurgery;  Laterality: N/A;  . WISDOM TOOTH EXTRACTION     bilateral lower.    There were no vitals filed for this visit.  Subjective Assessment - 02/17/18 0807    Subjective  Reports reduction in tightness s/p trigger point dry needling on Friday. Reports he continues to stretch with reduction in muscle tightness. Reports on Friday and was experiencing weakness and nerve pain to R LE but felt reduction in sx on Saturday. Believes this nerve pain could be attributed to the shoes he was wearing.    Patient is accompained by:  --   Wife T   Pertinent History  Patient enjoys maintaining a very active life style      Limitations  Standing;Walking;Lifting;Other (comment)    How long can you stand comfortably?  8-10 minutes    How long can you walk comfortably?  uses cane and RW household distance, RW for community distances    Diagnostic tests  MRI     Patient Stated Goals  Ambulation with no AD to get back to prior level of function.     Currently in Pain?  No/denies         [facility] PT Assessment - 02/17/18 1306      Berg Balance Test   Sit to Stand  Able to stand without using hands and stabilize independently    Standing Unsupported  Able to stand safely 2 minutes    Sitting with Back Unsupported but Feet Supported on Floor or Stool  Able to sit safely and securely 2 minutes    Stand to Sit  Sits safely with minimal use of hands    Transfers  Able to transfer safely, minor use of hands    Standing Unsupported with Eyes Closed  Able to stand 10 seconds safely    Standing Ubsupported with Feet Together  Able to place feet together independently and stand 1 minute safely    From Standing, Reach Forward with Outstretched Arm  Can reach confidently >25 cm (10")    From Standing Position, Pick up Object  from Floor  Able to pick up shoe safely and easily    From Standing Position, Turn to Look Behind Over each Shoulder  Looks behind from both sides and weight shifts well    Turn 360 Degrees  Able to turn 360 degrees safely but slowly    Standing Unsupported, Alternately Place Feet on Step/Stool  Able to stand independently and complete 8 steps >20 seconds    Standing Unsupported, One Foot in Front  Able to place foot tandem independently and hold 30 seconds    Standing on One Leg  Able to lift leg independently and hold 5-10 seconds    Total Score  52      Functional Gait  Assessment   Gait assessed   Yes    Gait Level Surface  Walks 20 ft, slow speed, abnormal gait pattern, evidence for imbalance or deviates 10-15 in outside of the 12 in walkway width. Requires more than 7 sec to ambulate 20 ft.     Change in Gait Speed  Makes only minor adjustments to walking speed, or accomplishes a change in speed with significant gait deviations, deviates 10-15 in outside the 12 in walkway width, or changes speed but loses balance but is able to recover and continue walking.    Gait with Horizontal Head Turns  Performs head turns with moderate changes in gait velocity, slows down, deviates 10-15 in outside 12 in walkway width but recovers, can continue to walk.    Gait with Vertical Head Turns  Performs task with moderate change in gait velocity, slows down, deviates 10-15 in outside 12 in walkway width but recovers, can continue to walk.    Gait and Pivot Turn  Turns slowly, requires verbal cueing, or requires several small steps to catch balance following turn and stop    Step Over Obstacle  Is able to step over one shoe box (4.5 in total height) but must slow down and adjust steps to clear box safely. May require verbal cueing.    Gait with Narrow Base of Support  Ambulates less than 4 steps heel to toe or cannot perform without assistance.    Gait with Eyes Closed  Walks 20 ft, slow speed, abnormal gait pattern, evidence for imbalance, deviates 10-15 in outside 12 in walkway width. Requires more than 9 sec to ambulate 20 ft.    Ambulating Backwards  Walks 20 ft, uses assistive device, slower speed, mild gait deviations, deviates 6-10 in outside 12 in walkway width.    Steps  Alternating feet, must use rail.    Total Score  11                   [facility] Adult PT Treatment/Exercise - 02/17/18 1306      Ambulation/Gait   Ambulation/Gait  Yes    Ambulation/Gait Assistance  5: Supervision    Ambulation Distance (Feet)  40 Feet   x2 trials   Assistive device  Straight cane;None    Gait Pattern  Step-through pattern;Decreased stride length;Decreased hip/knee flexion - right;Decreased hip/knee flexion - left;Decreased weight shift to left;Decreased trunk rotation;Wide base of support;Decreased  stance time - left;Decreased step length - right;Decreased arm swing - left    Ambulation Surface  Level;Indoor    Gait velocity  1.45 ft/sec with no AD; 1. 77 ft/sec with SPC. Both indicative of limited community ambulator     Stairs  Yes    Stairs Assistance  5: Supervision    Stair Management Technique  Two  rails;Alternating pattern    Number of Stairs  4   x3 trials= 12 steps total    Height of Stairs  6      Exercises   Exercises  Knee/Hip      Knee/Hip Exercises: Stretches   Passive Hamstring Stretch  3 reps;30 seconds;Left             PT Education - 02/17/18 1312    Education provided  Yes    Education Details  Therapist provided education regarding clinical findings from LTG reassessment and POC moving forward.     Person(s) Educated  Patient    Methods  Explanation    Comprehension  Verbalized understanding       PT Short Term Goals - 01/31/18 0949      PT SHORT TERM GOAL #1   Title  Pt will improve standing balance as indicated by increased BERG balance score to >/= 42/56  (ALL STG due by 02/02/18)    Baseline  10/25: 47/56 indicative of moderate fall risk potential     Time  4    Period  Weeks    Status  Achieved      PT SHORT TERM GOAL #2   Title  Pt will participate in FGA assessment to establish fall risk potential with dynamic functional mobility     Baseline  10/25: 11/30    Time  4    Period  Weeks    Status  Achieved      PT SHORT TERM GOAL #3   Title  Pt will improve gait speed to >/=2.6 ft/sec with LRAD indicating improvement with functional mobility placing him in the community ambulator category     Baseline  10/25: 1.70 ft/sec with SPC indicative of limited community ambulator; 1.36 ft/sec with no AD indicative of limited community ambulator     Time  4    Period  Weeks    Status  Not Met      PT SHORT TERM GOAL #4   Title  Pt will perform 12 steps with single HR and SPC with reciprocal stepping pattern and supervision-min A indicating  improvement with functional mobility and LE strength     Baseline  10/25: 12 steps with reciprocal stepping and bilateral HR's with supervision     Time  4    Period  Weeks    Status  Partially Met      PT SHORT TERM GOAL #5   Title  Pt will be able to ambulate 500 ft in the community over uneven surfaces, grass, ramps, and curbs with supervision using LRAD for improvement in community ambulation distances    Baseline  10/25: ambulated 500 feet with SPC and supervision     Time  4    Period  Weeks    Status  Achieved        PT Long Term Goals - 02/17/18 1257      PT LONG TERM GOAL #1   Title  Pt will be independent with balance and LE strengthening HEP to further improve functional independence (ALL LTG due by 03/04/18)    Time  8    Period  Weeks    Status  On-going      PT LONG TERM GOAL #2   Title  Pt will improve standing balance and BERG >/= 50/56 to decrease falls risk    Baseline  11/11: 52/56     Time  8    Period  Weeks  Status  Achieved      PT LONG TERM GOAL #3   Title  Pt will improve gait velocity to >/= 2.62 ft/sec with LRAD indicating improvement in functional mobility     Baseline  11/11: 1.45 ft/sec with no AD; 1.77 ft/sec with SPC     Time  8    Period  Weeks    Status  Not Met      PT LONG TERM GOAL #4   Title  Pt will ambulate x 1000 over outdoor surfaces + curb (paved and grassy) MOD I with LRAD to improve functional mobility with community distances    Time  8    Period  Weeks    Status  On-going      PT LONG TERM GOAL #5   Title  Pt will negotiate 12 steps with alternating stepping MOD I using single HR and SPC to improve functional mobility     Baseline  11/11; 12 steps alternating pattern with B HR's and therapist providing supervision    Time  8    Period  Weeks    Status  Partially Met      PT LONG TERM GOAL #6   Title  Pt will demonstrate an FGA score of >/=19/30 to decrease fall risk potential with functional mobility using LRAD      Baseline  11/11: 11/30 indicating high fall risk potential with no AD    Time  8    Period  Weeks    Status  Not Met      NEW GOALS FOR RECERTIFICATION: PT Short Term Goals - 02/17/18 2257      PT SHORT TERM GOAL #1   Title  = LTG      PT Long Term Goals - 02/17/18 1330      PT LONG TERM GOAL #1   Title  Pt will be independent with balance and LE strengthening HEP to further improve functional independence     Time  6    Period  Weeks    Status  On-going    Target Date  04/03/18      PT LONG TERM GOAL #2   Title  Pt will improve standing balance and BERG > 52/56 to decrease falls risk and improve functional mobility    Baseline  11/11: 52/56     Time  6    Period  Weeks    Status  New    Target Date  04/03/18      PT LONG TERM GOAL #3   Title  Pt will improve gait velocity to >/= 2.62 ft/sec with LRAD indicating improvement in functional mobility     Baseline  11/11: 1.45 ft/sec with no AD; 1.77 ft/sec with SPC     Time  6    Period  Weeks    Status  New    Target Date  04/03/18      PT LONG TERM GOAL #4   Title  Pt will ambulate x 1000 over outdoor surfaces + curb (paved and grassy) MOD I with LRAD to improve functional mobility with community distances    Baseline  11/11: Pt ambulates community distances with [facility]     Time  6    Period  Weeks    Status  On-going    Target Date  04/03/18      PT LONG TERM GOAL #5   Title  Pt will negotiate 12 steps with alternating stepping MOD I using single  HR and SPC to improve functional mobility     Baseline  11/11; 12 steps alternating pattern with B HR's and therapist providing supervision    Time  6    Period  Weeks    Status  New    Target Date  04/03/18      PT LONG TERM GOAL #6   Title  Pt will demonstrate an FGA score of >/=19/30 to decrease fall risk potential with functional mobility using LRAD     Baseline  11/11: 11/30 indicating high fall risk potential with no AD    Time  6    Period  Weeks    Status  New     Target Date  04/03/18            Plan - 02/17/18 1327    Clinical Impression Statement  Skilled session focused on assessing patient's progress with long term goals and recertifying patient for increased physical therapy visits to continue improving functional mobility. Overall patient has met 1/6 LTG's, did not meet 2/6 LTG's, partially met 1/6 LTG's, and 2/6 LTG's are ongoing. Pt's berg score has improved to 52/56 indicating lower fall risk potential. Pt's FGA score of 11/30 did not improve from previous assessment; however, this time patient was able to perform assessment with no AD in comparison to previously assessed with him using his SPC. Pt's gait speed has not improved to goal level and he is classified as a limited community ambulator both with and without an AD. Pt partially met his stair goal demonstrated by completing 12 steps with reciprocal stepping technique; however, he continues to require B HR's for assistance and therapist providing supervision. At this time, therapist recommends patient be recertified for more physical therapy visits to continue improving strength, balance, and functional mobility.     Clinical Decision Making  --    Rehab Potential  Good    Clinical Impairments Affecting Rehab Potential  --    PT Frequency  3x / week    PT Duration  6 weeks    PT Treatment/Interventions  ADLs/Self Care Home Management;Cryotherapy;Electrical Stimulation;Moist Heat;DME Instruction;Gait Scientist, forensic;Therapeutic activities;Functional mobility training;Therapeutic exercise;Balance training;Neuromuscular re-education;Patient/family education;Orthotic Fit/Training;Passive range of motion;Taping;Aquatic Therapy;Dry needling;Manual techniques    PT Next Visit Plan  SCHEDULE MORE LAND/AQUATIC VISITS 2X/WEEK THROUGH 12/26 (AP/CW/SD).  continue TDN; L LE strengthening/ bioness with steering electrode, dynamic standing balance and gait training without cane, postural training,  plyometric training (trampoline)    Consulted and Agree with Plan of Care  Patient       Patient will benefit from skilled therapeutic intervention in order to improve the following deficits and impairments:  Abnormal gait, Decreased balance, Decreased coordination, Decreased strength, Difficulty walking, Increased muscle spasms, Impaired sensation, Pain, Impaired tone, Decreased range of motion  Visit Diagnosis: Other abnormalities of gait and mobility  Muscle weakness (generalized)  Unsteadiness  Other symptoms and signs involving the nervous system  Other disturbances of skin sensation  Pain in thoracic spine  Paraplegia, incomplete [facility])     Problem List Patient Active Problem List   Diagnosis Date Noted  . S/P lumbar laminectomy 12/27/2017  . Paraplegia ([place]) 11/07/2017  . Quadriplegia, unspecified ([place]) 11/07/2017  . Leukocytosis   . Neuropathic pain   . Drug induced constipation   . Urinary retention   . Class 1 obesity due to excess calories with serious comorbidity and body mass index (BMI) of 33.0 to 33.9 in adult   . Myelopathy ([place]) 08/01/2017  . Thoracic  spinal stenosis 07/31/2017    Floreen Comber, SPT 02/17/2018, 1:23 PM  [facility] [address] [place], [place], [address] Phone: [phone]   Fax:  [phone]  Name: Scott Lane MRN: [id number] Date of Birth: Nov 23, 1985

## 2018-02-19 ENCOUNTER — Ambulatory Visit: Payer: 59 | Admitting: Physical Therapy

## 2018-02-19 DIAGNOSIS — M546 Pain in thoracic spine: Secondary | ICD-10-CM | POA: Diagnosis not present

## 2018-02-19 DIAGNOSIS — R208 Other disturbances of skin sensation: Secondary | ICD-10-CM | POA: Diagnosis not present

## 2018-02-19 DIAGNOSIS — R2681 Unsteadiness on feet: Secondary | ICD-10-CM | POA: Diagnosis not present

## 2018-02-19 DIAGNOSIS — R2689 Other abnormalities of gait and mobility: Secondary | ICD-10-CM | POA: Diagnosis not present

## 2018-02-19 DIAGNOSIS — M6281 Muscle weakness (generalized): Secondary | ICD-10-CM

## 2018-02-19 DIAGNOSIS — R29818 Other symptoms and signs involving the nervous system: Secondary | ICD-10-CM | POA: Diagnosis not present

## 2018-02-19 DIAGNOSIS — G8222 Paraplegia, incomplete: Secondary | ICD-10-CM | POA: Diagnosis not present

## 2018-02-20 ENCOUNTER — Ambulatory Visit: Payer: 59 | Admitting: Physical Therapy

## 2018-02-20 NOTE — Therapy (Signed)
[facility] [address] [place], [place], [address] Phone: [phone]   Fax:  [phone]  Physical Therapy Treatment  Patient Details  Name: Scott Lane MRN: [id number] Date of Birth: [birth year] Referring Provider (PT): Marikay Alar, MD   Encounter Date: 02/19/2018  PT End of Session - 02/20/18 2006    Visit Number  17    Number of Visits  28    Date for PT Re-Evaluation  04/03/18    Authorization Type  [place] Family Deductible.  VL: MN    PT Start Time  1505    PT Stop Time  1550    PT Time Calculation (min)  45 min       Past Medical History:  Diagnosis Date  . Chicken pox     Past Surgical History:  Procedure Laterality Date  . LUMBAR LAMINECTOMY/DECOMPRESSION MICRODISCECTOMY N/A 08/01/2017   Procedure: Thoracic nine, ten, eleven, twelve laminectomy;  Surgeon: Tia Alert, MD;  Location: [facility] OR;  Service: Neurosurgery;  Laterality: N/A;  . none    . THORACIC DISCECTOMY N/A 12/27/2017   Procedure: Laminectomy and Foraminotomy - Thoracic one - Thoracic three;  Surgeon: Tia Alert, MD;  Location: [facility] OR;  Service: Neurosurgery;  Laterality: N/A;  . WISDOM TOOTH EXTRACTION     bilateral lower.    There were no vitals filed for this visit.  Subjective Assessment - 02/20/18 1957    Subjective  Pt states he always feels better the day after aquatic therapy; states the morning after pool exercise he is able to walk much more easily and doesn't have to use his cane with household amb. (in the mornings after aquatic therapy)    Pertinent History  Patient enjoys maintaining a very active life style     Limitations  Standing;Walking;Lifting;Other (comment)    How long can you stand comfortably?  8-10 minutes    How long can you walk comfortably?  uses cane and RW household distance, RW for community distances    Diagnostic tests  MRI     Patient Stated Goals  Ambulation with no AD to get back to prior  level of function.     Currently in Pain?  No/denies       Aquatic therapy - pool temp. 87.2 degrees   Patient seen for aquatic therapy today.  Treatment took place in water 3.5-4 feet deep depending upon activity.  Pt entered and exited  the pool via step negotiation with use of bilateral hand rails modified independently.    LLE stretches - runner's stretch x 30 sec hold and gastroc stretch with bil. forefeet on pool wall - 30 sec hold x 1 rep Pt performed LLE strengthening with use of ankle cuff flotation weight - hip flexion, abduction and extension 10 reps each;  Lt hip flexion/extension with knee flexed at 90 degrees with UE support on noodle for assistance with balance; hip abduction in standing with knee flexed at 90 degrees With use of cuff weight  Pt performed marching in place  - slowly - for improved SLS on each leg 10 reps; 10 reps quickly to use viscosity of water for resistance and challenge with standing balance; Pt performed 1/2 jumping jacks with bar bell in each hand for added resistance 10 reps  Pt performed closed chain strengthening with use of noodle - standing with back against wall - pushing noodle down toward floor with each leg for hip extensor Strengthening and for improved SLS  on each leg   Pt performed kicking approx. 40' x 2 reps across pool - use of noodle for assistance with flotation as arms not used to increase LE AROM and strength  Pt performed sitting on pool noodle - with mod assist initially to maintain balance and then with min to CGA as needed for balance recovery  Pt performed Ai Chi postures - balancing, accepting with grace and soothing - all 10 reps each for core stabilization and improved balance with turning   Pt requires buoyancy of water for support and to prevent fall with high level balance and ballistic movements which are unable to be safely performed on land                 PT Short Term Goals - 02/17/18 2257       PT SHORT TERM GOAL #1   Title  = LTG        PT Long Term Goals - 02/17/18 1330      PT LONG TERM GOAL #1   Title  Pt will be independent with balance and LE strengthening HEP to further improve functional independence     Time  6    Period  Weeks    Status  On-going    Target Date  04/03/18      PT LONG TERM GOAL #2   Title  Pt will improve standing balance and BERG > 52/56 to decrease falls risk and improve functional mobility    Baseline  11/11: 52/56     Time  6    Period  Weeks    Status  New    Target Date  04/03/18      PT LONG TERM GOAL #3   Title  Pt will improve gait velocity to >/= 2.62 ft/sec with LRAD indicating improvement in functional mobility     Baseline  11/11: 1.45 ft/sec with no AD; 1.77 ft/sec with SPC     Time  6    Period  Weeks    Status  New    Target Date  04/03/18      PT LONG TERM GOAL #4   Title  Pt will ambulate x 1000 over outdoor surfaces + curb (paved and grassy) MOD I with LRAD to improve functional mobility with community distances    Baseline  11/11: Pt ambulates community distances with SPC     Time  6    Period  Weeks    Status  On-going    Target Date  04/03/18      PT LONG TERM GOAL #5   Title  Pt will negotiate 12 steps with alternating stepping MOD I using single HR and SPC to improve functional mobility     Baseline  11/11; 12 steps alternating pattern with B HR's and therapist providing supervision    Time  6    Period  Weeks    Status  New    Target Date  04/03/18      PT LONG TERM GOAL #6   Title  Pt will demonstrate an FGA score of >/=19/30 to decrease fall risk potential with functional mobility using LRAD     Baseline  11/11: 11/30 indicating high fall risk potential with no AD    Time  6    Period  Weeks    Status  New    Target Date  04/03/18            Plan - 02/20/18 2007  Clinical Impression Statement  Aquatic therapy session focused on strengthening exercises, balance and trunk/core stabilization  exercises.  Pt has decreased SLS on LLE and also decreased trunk control noted during Ai Chi postures and sitting balance activity on water noodle, with pt initially requiring mod assist to maintain balance but improving with practice and repetition with only min to CGA needed after multiple trials of performing this activity.   Pt improving with maintaining balance with ballistic exercises in water.      Rehab Potential  Good    Clinical Impairments Affecting Rehab Potential  Pt currently reports pain and tightness to mid thoracic region near surgical incision which is limiting his functional mobility     PT Frequency  3x / week    PT Duration  6 weeks    PT Treatment/Interventions  ADLs/Self Care Home Management;Cryotherapy;Electrical Stimulation;Moist Heat;DME Instruction;Gait Network engineer;Therapeutic activities;Functional mobility training;Therapeutic exercise;Balance training;Neuromuscular re-education;Patient/family education;Orthotic Fit/Training;Passive range of motion;Taping;Aquatic Therapy;Dry needling;Manual techniques    PT Next Visit Plan  SCHEDULE MORE LAND/AQUATIC VISITS 2X/WEEK THROUGH 12/26 (AP/CW/SD).  continue TDN; L LE strengthening/ bioness with steering electrode, dynamic standing balance and gait training without cane, postural training, plyometric training (trampoline)    PT Home Exercise Plan  Ai Chi postures to be given to pt on 02-21-18 - for pt to perform at [facility] in pool    Consulted and Agree with Plan of Care  Patient       Patient will benefit from skilled therapeutic intervention in order to improve the following deficits and impairments:  Abnormal gait, Decreased balance, Decreased coordination, Decreased strength, Difficulty walking, Increased muscle spasms, Impaired sensation, Pain, Impaired tone, Decreased range of motion  Visit Diagnosis: Other abnormalities of gait and mobility  Muscle weakness (generalized)  Unsteadiness     Problem  List Patient Active Problem List   Diagnosis Date Noted  . S/P lumbar laminectomy 12/27/2017  . Paraplegia ([facility]) 11/07/2017  . Quadriplegia, unspecified ([facility]) 11/07/2017  . Leukocytosis   . Neuropathic pain   . Drug induced constipation   . Urinary retention   . Class 1 obesity due to excess calories with serious comorbidity and body mass index (BMI) of 33.0 to 33.9 in adult   . Myelopathy ([facility]) 08/01/2017  . Thoracic spinal stenosis 07/31/2017    [id number], VHQIO [id number], PT 02/20/2018, 8:16 PM  [facility] [address] [place], [place], [address] Phone: [phone]   Fax:  [phone]  Name: Scott Lane MRN: [id number] Date of Birth: 06-03-1985

## 2018-02-21 ENCOUNTER — Encounter: Payer: Self-pay | Admitting: Physical Therapy

## 2018-02-21 ENCOUNTER — Ambulatory Visit: Payer: 59 | Admitting: Physical Therapy

## 2018-02-21 DIAGNOSIS — R2689 Other abnormalities of gait and mobility: Secondary | ICD-10-CM | POA: Diagnosis not present

## 2018-02-21 DIAGNOSIS — G8222 Paraplegia, incomplete: Secondary | ICD-10-CM | POA: Diagnosis not present

## 2018-02-21 DIAGNOSIS — R2681 Unsteadiness on feet: Secondary | ICD-10-CM | POA: Diagnosis not present

## 2018-02-21 DIAGNOSIS — M546 Pain in thoracic spine: Secondary | ICD-10-CM | POA: Diagnosis not present

## 2018-02-21 DIAGNOSIS — R29818 Other symptoms and signs involving the nervous system: Secondary | ICD-10-CM | POA: Diagnosis not present

## 2018-02-21 DIAGNOSIS — M6281 Muscle weakness (generalized): Secondary | ICD-10-CM

## 2018-02-21 DIAGNOSIS — R208 Other disturbances of skin sensation: Secondary | ICD-10-CM | POA: Diagnosis not present

## 2018-02-21 NOTE — Therapy (Signed)
[facility] [address] [place], [place], [address] Phone: [phone]   Fax:  [phone]  Physical Therapy Treatment  Patient Details  Name: Scott Lane MRN: [id number] Date of Birth: 08-09-1985 Referring Provider (PT): Marikay Alar, MD   Encounter Date: 02/21/2018  PT End of Session - 02/21/18 1032    Visit Number  18    Number of Visits  28    Date for PT Re-Evaluation  04/03/18    Authorization Type  [place] [employer] Deductible.  VL: MN    PT Start Time  0805    PT Stop Time  0845    PT Time Calculation (min)  40 min    Activity Tolerance  Patient tolerated treatment well    Behavior During Therapy  WFL for tasks assessed/performed       Past Medical History:  Diagnosis Date  . Chicken pox     Past Surgical History:  Procedure Laterality Date  . LUMBAR LAMINECTOMY/DECOMPRESSION MICRODISCECTOMY N/A 08/01/2017   Procedure: Thoracic nine, ten, eleven, twelve laminectomy;  Surgeon: Tia Alert, MD;  Location: [facility] OR;  Service: Neurosurgery;  Laterality: N/A;  . none    . THORACIC DISCECTOMY N/A 12/27/2017   Procedure: Laminectomy and Foraminotomy - Thoracic one - Thoracic three;  Surgeon: Tia Alert, MD;  Location: [facility] OR;  Service: Neurosurgery;  Laterality: N/A;  . WISDOM TOOTH EXTRACTION     bilateral lower.    There were no vitals filed for this visit.  Subjective Assessment - 02/21/18 0811    Subjective  Reports everything is going well. He continues to see improvement s/p aquatic therapy and is weight lifting at the gym. Pt reports shooting nerve pain spontaneously in R leg.     Patient is accompained by:  --   Wife T   Pertinent History  Patient enjoys maintaining a very active life style     Limitations  Standing;Walking;Lifting;Other (comment)    How long can you stand comfortably?  8-10 minutes    How long can you walk comfortably?  uses cane and RW household distance, RW for community  distances    Diagnostic tests  MRI     Patient Stated Goals  Ambulation with no AD to get back to prior level of function.     Currently in Pain?  No/denies         [facility] Adult PT Treatment/Exercise - 02/21/18 1028      Neuro Re-ed    Neuro Re-ed Details   Pt performs exercises with bioness upper and lower thigh cuff applied on training mode for NMR. Pt performs alternating step ups leading with L LE and descending with R LE on 4 inch step for 2x7 reps. Pt attempts 6 inch step but utilizes excessive tricep activation on the // bars with flexed trunk posture. Pt then performs lateral side stepping on 4 inch step with bioness. Pt demonstrates increased difficulty leading with L LE 2/2 to weakness. Pt then performs lateral side stepping with therapist cueing for proper posture and technique      Exercises   Exercises  Knee/Hip      Knee/Hip Exercises: Stretches   Passive Hamstring Stretch  3 reps;30 seconds;Left    ITB Stretch  Left;3 reps;30 seconds      Electrical Stimulation   Electrical Stimulation Location  Bioness upper and lower thigh cuff applied to L LE for quad and tibialis anterior activation with steering electrode  Electrical Stimulation Action  closed chain and open chain ankle dorsi flexion and quad activation     Electrical Stimulation Parameters  see tablet 2     Electrical Stimulation Goals  Strength;Neuromuscular facilitation             PT Education - 02/21/18 1031    Education provided  Yes    Education Details  Therapist provided education regarding implications for TPDN and to follow up with any concerns. Pt educated on continuing to perform strengthening exercises at the gym to improve proximal hip weakness.     Person(s) Educated  Patient    Methods  Explanation    Comprehension  Verbalized understanding       PT Short Term Goals - 02/17/18 2257      PT SHORT TERM GOAL #1   Title  = LTG        PT Long Term Goals - 02/17/18 1330      PT LONG  TERM GOAL #1   Title  Pt will be independent with balance and LE strengthening HEP to further improve functional independence     Time  6    Period  Weeks    Status  On-going    Target Date  04/03/18      PT LONG TERM GOAL #2   Title  Pt will improve standing balance and BERG > 52/56 to decrease falls risk and improve functional mobility    Baseline  11/11: 52/56     Time  6    Period  Weeks    Status  New    Target Date  04/03/18      PT LONG TERM GOAL #3   Title  Pt will improve gait velocity to >/= 2.62 ft/sec with LRAD indicating improvement in functional mobility     Baseline  11/11: 1.45 ft/sec with no AD; 1.77 ft/sec with SPC     Time  6    Period  Weeks    Status  New    Target Date  04/03/18      PT LONG TERM GOAL #4   Title  Pt will ambulate x 1000 over outdoor surfaces + curb (paved and grassy) MOD I with LRAD to improve functional mobility with community distances    Baseline  11/11: Pt ambulates community distances with SPC     Time  6    Period  Weeks    Status  On-going    Target Date  04/03/18      PT LONG TERM GOAL #5   Title  Pt will negotiate 12 steps with alternating stepping MOD I using single HR and SPC to improve functional mobility     Baseline  11/11; 12 steps alternating pattern with B HR's and therapist providing supervision    Time  6    Period  Weeks    Status  New    Target Date  04/03/18      PT LONG TERM GOAL #6   Title  Pt will demonstrate an FGA score of >/=19/30 to decrease fall risk potential with functional mobility using LRAD     Baseline  11/11: 11/30 indicating high fall risk potential with no AD    Time  6    Period  Weeks    Status  New    Target Date  04/03/18            Plan - 02/21/18 1032    Clinical Impression Statement  Skilled  session focused on trigger point dry needling and strengthening exercises with L LE upper and lower bioness cuffs for NMR. Pt tolerates session well, and reports reduction in glut med  tightness s/p dry needling treatment. Pt able to tolerate forward step ups to 4 inch step with bioness on training mode; however, demonstrates increased challenge with lateral step ups 2/2 to proximal hip weakness. Pt will continue to benefit from skilled PT to address strength, balance, and functional mobility deficits.     Rehab Potential  Good    Clinical Impairments Affecting Rehab Potential  Pt currently reports pain and tightness to mid thoracic region near surgical incision which is limiting his functional mobility     PT Frequency  3x / week    PT Duration  6 weeks    PT Treatment/Interventions  ADLs/Self Care Home Management;Cryotherapy;Electrical Stimulation;Moist Heat;DME Instruction;Gait Network engineer;Therapeutic activities;Functional mobility training;Therapeutic exercise;Balance training;Neuromuscular re-education;Patient/family education;Orthotic Fit/Training;Passive range of motion;Taping;Aquatic Therapy;Dry needling;Manual techniques    PT Next Visit Plan  SCHEDULE MORE LAND/AQUATIC VISITS 2X/WEEK THROUGH 12/26 (AP/CW/SD).  continue TDN; L LE strengthening/ bioness with steering electrode, dynamic standing balance and gait training without cane, postural training, plyometric training (trampoline)    PT Home Exercise Plan  Revied Ai Chi postures given to pt on 02-21-18 - for pt to perform at [facility] in pool    Consulted and Agree with Plan of Care  Patient       Patient will benefit from skilled therapeutic intervention in order to improve the following deficits and impairments:  Abnormal gait, Decreased balance, Decreased coordination, Decreased strength, Difficulty walking, Increased muscle spasms, Impaired sensation, Pain, Impaired tone, Decreased range of motion  Visit Diagnosis: Muscle weakness (generalized)  Other abnormalities of gait and mobility     Problem List Patient Active Problem List   Diagnosis Date Noted  . S/P lumbar laminectomy 12/27/2017  .  Paraplegia (HCC) 11/07/2017  . Quadriplegia, unspecified (HCC) 11/07/2017  . Leukocytosis   . Neuropathic pain   . Drug induced constipation   . Urinary retention   . Class 1 obesity due to excess calories with serious comorbidity and body mass index (BMI) of 33.0 to 33.9 in adult   . Myelopathy (HCC) 08/01/2017  . Thoracic spinal stenosis 07/31/2017    Carney Living, SPT 02/21/2018, 10:35 AM  [facility] [address] [place], [place], [address] Phone: [phone]   Fax:  [phone]  Name: Scott Lane MRN: [id number] Date of Birth: Jul 18, 1985

## 2018-02-24 ENCOUNTER — Ambulatory Visit: Payer: 59 | Admitting: Physical Therapy

## 2018-02-24 DIAGNOSIS — R29818 Other symptoms and signs involving the nervous system: Secondary | ICD-10-CM | POA: Diagnosis not present

## 2018-02-24 DIAGNOSIS — R2681 Unsteadiness on feet: Secondary | ICD-10-CM | POA: Diagnosis not present

## 2018-02-24 DIAGNOSIS — M6281 Muscle weakness (generalized): Secondary | ICD-10-CM

## 2018-02-24 DIAGNOSIS — M546 Pain in thoracic spine: Secondary | ICD-10-CM | POA: Diagnosis not present

## 2018-02-24 DIAGNOSIS — R2689 Other abnormalities of gait and mobility: Secondary | ICD-10-CM

## 2018-02-24 DIAGNOSIS — G8222 Paraplegia, incomplete: Secondary | ICD-10-CM | POA: Diagnosis not present

## 2018-02-24 DIAGNOSIS — R208 Other disturbances of skin sensation: Secondary | ICD-10-CM | POA: Diagnosis not present

## 2018-02-24 NOTE — Therapy (Signed)
[facility] [address] [place], [place], [address] Phone: [phone]   Fax:  [phone]  Physical Therapy Treatment  Patient Details  Name: Scott Lane MRN: [id number] Date of Birth: July 29, 1985 Referring Provider (PT): Marikay Alar, MD   Encounter Date: 02/24/2018  PT End of Session - 02/24/18 1859    Visit Number  19    Number of Visits  28    Date for PT Re-Evaluation  04/03/18    Authorization Type  [place] Family Deductible.  VL: MN    PT Start Time  0802    PT Stop Time  0848    PT Time Calculation (min)  46 min       Past Medical History:  Diagnosis Date  . Chicken pox     Past Surgical History:  Procedure Laterality Date  . LUMBAR LAMINECTOMY/DECOMPRESSION MICRODISCECTOMY N/A 08/01/2017   Procedure: Thoracic nine, ten, eleven, twelve laminectomy;  Surgeon: Tia Alert, MD;  Location: [facility] OR;  Service: Neurosurgery;  Laterality: N/A;  . none    . THORACIC DISCECTOMY N/A 12/27/2017   Procedure: Laminectomy and Foraminotomy - Thoracic one - Thoracic three;  Surgeon: Tia Alert, MD;  Location: [facility] OR;  Service: Neurosurgery;  Laterality: N/A;  . WISDOM TOOTH EXTRACTION     bilateral lower.    There were no vitals filed for this visit.  Subjective Assessment - 02/24/18 1850    Subjective  Pt states left leg feels tight today - says he went to [facility] yesterday and did weight machines for strengthening - has not exercised in the pool yet     Pertinent History  Patient enjoys maintaining a very active life style     Limitations  Standing;Walking;Lifting;Other (comment)    How long can you stand comfortably?  8-10 minutes    How long can you walk comfortably?  uses cane and RW household distance, RW for community distances    Diagnostic tests  MRI     Patient Stated Goals  Ambulation with no AD to get back to prior level of function.     Currently in Pain?  No/denies                        [facility] Adult PT Treatment/Exercise - 02/24/18 0824      Ambulation/Gait   Ambulation/Gait  Yes    Ambulation/Gait Assistance  5: Supervision    Ambulation/Gait Assistance Details  pt has decreased weight shift onto LLE     Ambulation Distance (Feet)  75 Feet    Assistive device  Straight cane    Gait Pattern  Step-through pattern;Decreased stride length;Decreased hip/knee flexion - right;Decreased hip/knee flexion - left;Decreased weight shift to left;Decreased trunk rotation;Wide base of support;Decreased stance time - left;Decreased step length - right;Decreased arm swing - left    Ambulation Surface  Level;Indoor      Knee/Hip Exercises: Programme researcher, broadcasting/film/video  Left;Right;1 rep;30 seconds   Runner's stretch   Gastroc Stretch  Both;2 reps;30 seconds   1 rep with feet on step: 1 rep w/Prostretch      Knee/Hip Exercises: Standing   Heel Raises  Both;1 set;10 reps    Forward Step Up  Left;1 set;10 reps;Hand Hold: 2;Step Height: 6"    Step Down  Left;1 set;10 reps;Hand Hold: 2;Step Height: 6"    Other Standing Knee Exercises  Rt step ups 10 reps with RUE support  Seated Lt hamstring stretch with use of belt for stretching - 30 sec hold  Lt plantarflexion strengthening exercise off side of high/low mat table - Rt foot on 4" step to increase weight bearing on LLE 10 reps; then 2nd set of 10 reps with step removed and Rt foot placed on balance bubble for incr. Weight bearing on LLE   Squats bil. LE's 10 reps inside //bars with bil. UE support:  LLE only (small ROM) for unilateral squat 3 reps with bil. UE support on // bars  NeuroRe-ed;  Amb. Forward and backward 10' x 4 reps inside // bars with bil. UE support Tall kneeling on mat - sitting back on bil. Heels for quad stretching - 5 reps;  Attempted to sit back toward Rt heel and then toward Lt heel, but pt Unable to shift hips to either side (foot) due to c/o tightness  Lt 1/2  kneeling - initially with UE support, progressing to no UE support; pt performed alternate UE flexion 5 reps each side       PT Short Term Goals - 02/17/18 2257      PT SHORT TERM GOAL #1   Title  = LTG        PT Long Term Goals - 02/17/18 1330      PT LONG TERM GOAL #1   Title  Pt will be independent with balance and LE strengthening HEP to further improve functional independence     Time  6    Period  Weeks    Status  On-going    Target Date  04/03/18      PT LONG TERM GOAL #2   Title  Pt will improve standing balance and BERG > 52/56 to decrease falls risk and improve functional mobility    Baseline  11/11: 52/56     Time  6    Period  Weeks    Status  New    Target Date  04/03/18      PT LONG TERM GOAL #3   Title  Pt will improve gait velocity to >/= 2.62 ft/sec with LRAD indicating improvement in functional mobility     Baseline  11/11: 1.45 ft/sec with no AD; 1.77 ft/sec with SPC     Time  6    Period  Weeks    Status  New    Target Date  04/03/18      PT LONG TERM GOAL #4   Title  Pt will ambulate x 1000 over outdoor surfaces + curb (paved and grassy) MOD I with LRAD to improve functional mobility with community distances    Baseline  11/11: Pt ambulates community distances with SPC     Time  6    Period  Weeks    Status  On-going    Target Date  04/03/18      PT LONG TERM GOAL #5   Title  Pt will negotiate 12 steps with alternating stepping MOD I using single HR and SPC to improve functional mobility     Baseline  11/11; 12 steps alternating pattern with B HR's and therapist providing supervision    Time  6    Period  Weeks    Status  New    Target Date  04/03/18      PT LONG TERM GOAL #6   Title  Pt will demonstrate an FGA score of >/=19/30 to decrease fall risk potential with functional mobility using LRAD     Baseline  11/11: 11/30 indicating high fall risk potential with no AD    Time  6    Period  Weeks    Status  New    Target Date  04/03/18             Plan - 02/24/18 1902    Clinical Impression Statement  Skilled session focused on stretching and strengthening exercises for LLE; pt unable to perform step up onto 6" step without bil. UE support due to quad weakness.  Pt also unable to perform plantarflexion on LLE in standing with bil. UE support, but able to perform in closed chain position off side of raised high low table.  Pt also demonstrates decreased muscle endurance LLE with pt reporting fatigue after performing few closed chain exercises.      Rehab Potential  Good    Clinical Impairments Affecting Rehab Potential  Pt currently reports pain and tightness to mid thoracic region near surgical incision which is limiting his functional mobility     PT Frequency  3x / week    PT Duration  6 weeks    PT Treatment/Interventions  ADLs/Self Care Home Management;Cryotherapy;Electrical Stimulation;Moist Heat;DME Instruction;Gait Network engineertraining;Stair training;Therapeutic activities;Functional mobility training;Therapeutic exercise;Balance training;Neuromuscular re-education;Patient/family education;Orthotic Fit/Training;Passive range of motion;Taping;Aquatic Therapy;Dry needling;Manual techniques    PT Next Visit Plan  SCHEDULE MORE LAND/AQUATIC VISITS 2X/WEEK THROUGH 12/26 (AP/CW/SD).  continue TDN; L LE strengthening/ bioness with steering electrode, dynamic standing balance and gait training without cane, postural training, plyometric training (trampoline)   AQUATIC visits scheduled for Dec. (by SD)   PT Home Exercise Plan  Reviewed Ai Chi postures given to pt on 02-21-18 - for pt to perform at [employer] in pool    Consulted and Agree with Plan of Care  Patient       Patient will benefit from skilled therapeutic intervention in order to improve the following deficits and impairments:  Abnormal gait, Decreased balance, Decreased coordination, Decreased strength, Difficulty walking, Increased muscle spasms, Impaired sensation, Pain, Impaired  tone, Decreased range of motion  Visit Diagnosis: Other abnormalities of gait and mobility  Muscle weakness (generalized)     Problem List Patient Active Problem List   Diagnosis Date Noted  . S/P lumbar laminectomy 12/27/2017  . Paraplegia (HCC) 11/07/2017  . Quadriplegia, unspecified (HCC) 11/07/2017  . Leukocytosis   . Neuropathic pain   . Drug induced constipation   . Urinary retention   . Class 1 obesity due to excess calories with serious comorbidity and body mass index (BMI) of 33.0 to 33.9 in adult   . Myelopathy (HCC) 08/01/2017  . Thoracic spinal stenosis 07/31/2017    [id number], BJYNW GNFAOZHilday, Zeba Luby Suzanne, PT 02/24/2018, 7:12 PM  [facility] [address] [place], [place], [address] Phone: [phone]   Fax:  [phone]  Name: Scott Lane MRN: [id number] Date of Birth: 1985-05-26

## 2018-02-27 ENCOUNTER — Ambulatory Visit: Payer: 59 | Admitting: Physical Therapy

## 2018-02-27 DIAGNOSIS — M6281 Muscle weakness (generalized): Secondary | ICD-10-CM | POA: Diagnosis not present

## 2018-02-27 DIAGNOSIS — M546 Pain in thoracic spine: Secondary | ICD-10-CM | POA: Diagnosis not present

## 2018-02-27 DIAGNOSIS — G8222 Paraplegia, incomplete: Secondary | ICD-10-CM | POA: Diagnosis not present

## 2018-02-27 DIAGNOSIS — R29818 Other symptoms and signs involving the nervous system: Secondary | ICD-10-CM

## 2018-02-27 DIAGNOSIS — R2689 Other abnormalities of gait and mobility: Secondary | ICD-10-CM | POA: Diagnosis not present

## 2018-02-27 DIAGNOSIS — R208 Other disturbances of skin sensation: Secondary | ICD-10-CM

## 2018-02-27 DIAGNOSIS — R2681 Unsteadiness on feet: Secondary | ICD-10-CM

## 2018-02-27 NOTE — Therapy (Signed)
[facility] [address] [place], [place], [address] Phone: [phone]   Fax:  [phone]  Physical Therapy Treatment  Patient Details  Name: Scott Lane MRN: [id number] Date of Birth: 1986-02-04 Referring Provider (PT): Marikay Alaravid Jones, MD   Encounter Date: 02/27/2018  PT End of Session - 02/27/18 1233    Visit Number  20    Number of Visits  28    Date for PT Re-Evaluation  04/03/18    Authorization Type  [employer] Deductible.  VL: MN    PT Start Time  0808    PT Stop Time  0848    PT Time Calculation (min)  40 min       Past Medical History:  Diagnosis Date  . Chicken pox     Past Surgical History:  Procedure Laterality Date  . LUMBAR LAMINECTOMY/DECOMPRESSION MICRODISCECTOMY N/A 08/01/2017   Procedure: Thoracic nine, ten, eleven, twelve laminectomy;  Surgeon: Tia AlertJones, David S, MD;  [facility];  Service: Neurosurgery;  Laterality: N/A;  . none    . THORACIC DISCECTOMY N/A 12/27/2017   Procedure: Laminectomy and Foraminotomy - Thoracic one - Thoracic three;  Surgeon: Tia AlertJones, David S, MD;  [facility];  Service: Neurosurgery;  Laterality: N/A;  . WISDOM TOOTH EXTRACTION     bilateral lower.    There were no vitals filed for this visit.  Subjective Assessment - 02/27/18 1222    Subjective  [facility] pool HEP twice.  LE did not feel stiff this morning when he got up.  Gets Botox on Dec 2nd    Pertinent History  Patient enjoys maintaining a very active life style     Limitations  Standing;Walking;Lifting;Other (comment)    How long can you stand comfortably?  8-10 minutes    How long can you walk comfortably?  uses cane and RW household distance, RW for community distances    Diagnostic tests  MRI     Patient Stated Goals  Ambulation with no AD to get back to prior level of function.                        [facility] Adult PT Treatment/Exercise - 02/27/18  1224      Ambulation/Gait   Ambulation/Gait  Yes    Ambulation/Gait Assistance  5: Supervision    Ambulation/Gait Assistance Details  without cane but with Bioness in gait mode.  Pt continues to demonstrate slight hesitancy to WB through LLE without use of cane but after first few steps pt able to increase stance time on L until he makes a turn/change in direction.  Still presents with mild lateral instability    Ambulation Distance (Feet)  115 Feet    Assistive device  None    Gait Pattern  Step-to pattern;Step-through pattern;Decreased step length - left;Decreased stance time - left;Decreased stride length;Decreased hip/knee flexion - left;Decreased weight shift to left;Left genu recurvatum    Ambulation Surface  Level;Indoor      Exercises   Exercises  Knee/Hip      Knee/Hip Exercises: Plyometrics   Bilateral Jumping  1 set;10 reps    Bilateral Jumping Limitations  on mini trampoline    Unilateral Jumping  1 set;10 reps    Unilateral Jumping Limitations  on mini trampoline, each LE performing single leg jumps    Other Plyometric Exercises  On mini trampoline performed mini jacks x 10 reps with  pt performing with bilat UE support      Knee/Hip Exercises: Standing   Forward Step Up  Left;2 sets;10 reps;Hand Hold: 2;Step Height: 4";Hand Hold: 0    Other Standing Knee Exercises  With Bioness in training mode pt performed straight forwards and then diagonal loading of LLE when stimulation is on.  Then transitioned to step ups straight forwards and then at slight diagonal for increased lateral hip stability training with pt initially holding with bilat UE support and then releasing support at the top of the step up      Modalities   Modalities  Electrical Stimulation      Electrical Stimulation   Electrical Stimulation Location  L hamstring and L anterior tibialis    Electrical Stimulation Action  closed chain hip extension, knee flexion and ankle DF    Electrical Stimulation Parameters   See saved parameters in tablet 2    Electrical Stimulation Goals  Strength;Tone;Neuromuscular facilitation             PT Education - 02/27/18 1233    Education provided  Yes    Education Details  plan to add more visits through December including more pool visits    Person(s) Educated  Patient    Methods  Explanation    Comprehension  Verbalized understanding       PT Short Term Goals - 02/17/18 2257      PT SHORT TERM GOAL #1   Title  = LTG        PT Long Term Goals - 02/17/18 1330      PT LONG TERM GOAL #1   Title  Pt will be independent with balance and LE strengthening HEP to further improve functional independence     Time  6    Period  Weeks    Status  On-going    Target Date  04/03/18      PT LONG TERM GOAL #2   Title  Pt will improve standing balance and BERG > 52/56 to decrease falls risk and improve functional mobility    Baseline  11/11: 52/56     Time  6    Period  Weeks    Status  New    Target Date  04/03/18      PT LONG TERM GOAL #3   Title  Pt will improve gait velocity to >/= 2.62 ft/sec with LRAD indicating improvement in functional mobility     Baseline  11/11: 1.45 ft/sec with no AD; 1.77 ft/sec with SPC     Time  6    Period  Weeks    Status  New    Target Date  04/03/18      PT LONG TERM GOAL #4   Title  Pt will ambulate x 1000 over outdoor surfaces + curb (paved and grassy) MOD I with LRAD to improve functional mobility with community distances    Baseline  11/11: Pt ambulates community distances with SPC     Time  6    Period  Weeks    Status  On-going    Target Date  04/03/18      PT LONG TERM GOAL #5   Title  Pt will negotiate 12 steps with alternating stepping MOD I using single HR and SPC to improve functional mobility     Baseline  11/11; 12 steps alternating pattern with B HR's and therapist providing supervision    Time  6    Period  Weeks  Status  New    Target Date  04/03/18      PT LONG TERM GOAL #6   Title  Pt  will demonstrate an FGA score of >/=19/30 to decrease fall risk potential with functional mobility using LRAD     Baseline  11/11: 11/30 indicating high fall risk potential with no AD    Time  6    Period  Weeks    Status  New    Target Date  04/03/18            Plan - 02/27/18 1234    Clinical Impression Statement  Continued to focus on use of functional electrical stimulation during gait without AD, dynamic loading and strengthening and introduction of plyometric exercises on trampoline.  Pt tolerated well but did report LLE fatigue with diagonal step ups and trampoline.  Will continue to address to progress towards LTG.    Rehab Potential  Good    Clinical Impairments Affecting Rehab Potential  Pt currently reports pain and tightness to mid thoracic region near surgical incision which is limiting his functional mobility     PT Frequency  3x / week    PT Duration  6 weeks    PT Treatment/Interventions  ADLs/Self Care Home Management;Cryotherapy;Electrical Stimulation;Moist Heat;DME Instruction;Gait Network engineer;Therapeutic activities;Functional mobility training;Therapeutic exercise;Balance training;Neuromuscular re-education;Patient/family education;Orthotic Fit/Training;Passive range of motion;Taping;Aquatic Therapy;Dry needling;Manual techniques    PT Next Visit Plan  continue TDN; L LE strengthening/ bioness with steering electrode, dynamic standing balance and gait training without cane-introduce obstacles, weaving/changes in direction, postural training, plyometric training (trampoline)   AQUATIC visits scheduled for Dec. (by SD)   PT Home Exercise Plan  Reviewed Ai Chi postures given to pt on 02-21-18 - for pt to perform at [facility] in pool    Consulted and Agree with Plan of Care  Patient       Patient will benefit from skilled therapeutic intervention in order to improve the following deficits and impairments:  Abnormal gait, Decreased balance, Decreased coordination,  Decreased strength, Difficulty walking, Increased muscle spasms, Impaired sensation, Pain, Impaired tone, Decreased range of motion  Visit Diagnosis: Muscle weakness (generalized)  Other abnormalities of gait and mobility  Unsteadiness  Other symptoms and signs involving the nervous system  Other disturbances of skin sensation  Paraplegia, incomplete (HCC)     Problem List Patient Active Problem List   Diagnosis Date Noted  . S/P lumbar laminectomy 12/27/2017  . Paraplegia (HCC) 11/07/2017  . Quadriplegia, unspecified (HCC) 11/07/2017  . Leukocytosis   . Neuropathic pain   . Drug induced constipation   . Urinary retention   . Class 1 obesity due to excess calories with serious comorbidity and body mass index (BMI) of 33.0 to 33.9 in adult   . Myelopathy (HCC) 08/01/2017  . Thoracic spinal stenosis 07/31/2017    Dierdre Highman, PT, DPT 02/27/18    12:39 PM    [facility] [address] [place], [place], [address] Phone: [phone]   Fax:  [phone]  Name: Scott Lane MRN: [id number] Date of Birth: 06/28/1985

## 2018-03-03 ENCOUNTER — Telehealth: Payer: Self-pay | Admitting: Neurology

## 2018-03-03 ENCOUNTER — Ambulatory Visit: Payer: 59 | Admitting: Physical Therapy

## 2018-03-03 DIAGNOSIS — M6281 Muscle weakness (generalized): Secondary | ICD-10-CM | POA: Diagnosis not present

## 2018-03-03 DIAGNOSIS — R2681 Unsteadiness on feet: Secondary | ICD-10-CM | POA: Diagnosis not present

## 2018-03-03 DIAGNOSIS — R208 Other disturbances of skin sensation: Secondary | ICD-10-CM | POA: Diagnosis not present

## 2018-03-03 DIAGNOSIS — G8222 Paraplegia, incomplete: Secondary | ICD-10-CM | POA: Diagnosis not present

## 2018-03-03 DIAGNOSIS — R29818 Other symptoms and signs involving the nervous system: Secondary | ICD-10-CM

## 2018-03-03 DIAGNOSIS — R2689 Other abnormalities of gait and mobility: Secondary | ICD-10-CM

## 2018-03-03 DIAGNOSIS — M546 Pain in thoracic spine: Secondary | ICD-10-CM | POA: Diagnosis not present

## 2018-03-03 NOTE — Therapy (Signed)
[facility] [address] [place], [place], [address] Phone: [phone]   Fax:  [phone]  Physical Therapy Treatment  Patient Details  Name: Scott Lane MRN: [id number] Date of Birth: 12/25/1985 Referring Provider (PT): Marikay Alar, MD   Encounter Date: 03/03/2018  PT End of Session - 03/03/18 1650    Visit Number  21    Number of Visits  28    Date for PT Re-Evaluation  04/03/18    Authorization Type  [employer] Deductible.  VL: MN    PT Start Time  0803    PT Stop Time  0848    PT Time Calculation (min)  45 min    Activity Tolerance  Patient tolerated treatment well    Behavior During Therapy  WFL for tasks assessed/performed       Past Medical History:  Diagnosis Date  . Chicken pox     Past Surgical History:  Procedure Laterality Date  . LUMBAR LAMINECTOMY/DECOMPRESSION MICRODISCECTOMY N/A 08/01/2017   Procedure: Thoracic nine, ten, eleven, twelve laminectomy;  Surgeon: Tia Alert, MD;  Location: [facility] OR;  Service: Neurosurgery;  Laterality: N/A;  . none    . THORACIC DISCECTOMY N/A 12/27/2017   Procedure: Laminectomy and Foraminotomy - Thoracic one - Thoracic three;  Surgeon: Tia Alert, MD;  Location: [facility] OR;  Service: Neurosurgery;  Laterality: N/A;  . WISDOM TOOTH EXTRACTION     bilateral lower.    There were no vitals filed for this visit.  Subjective Assessment - 03/03/18 0817    Subjective  Hamstrings are very tight after doing legs at the gym.  Feels his quad is getting stronger and it is easier to push through the LLE when he is going up the stairs.  Has been to the pool x 3 times    Pertinent History  Patient enjoys maintaining a very active life style     Limitations  Standing;Walking;Lifting;Other (comment)    How long can you stand comfortably?  8-10 minutes    How long can you walk comfortably?  uses cane and RW household distance, RW for community distances    Diagnostic tests  MRI     Patient Stated Goals  Ambulation with no AD to get back to prior level of function.     Currently in Pain?  Yes                       [facility] Adult PT Treatment/Exercise - 03/03/18 0838      Knee/Hip Exercises: Stretches   Active Hamstring Stretch  Left;3 reps;60 seconds   with hip ER/IR     Knee/Hip Exercises: Aerobic   Elliptical  Level 1 x 3 sets x 2 minutes with therapist providing assistance to weight shift over LLE and cues to push through heel to active quad      Knee/Hip Exercises: Plyometrics   Bilateral Jumping  1 set;10 reps;Other (comment)    Bilateral Jumping Limitations  on mini trampoline, bounce with small hop    Other Plyometric Exercises  mini jacks x 12, split jumps x 12 with UE support      Knee/Hip Exercises: Standing   Other Standing Knee Exercises  Agility ladder x 3 reps, first with cane and then without x 2 reps performing forwards and side stepping inside and outside box with focus on decreasing use of UE and increasing speed of movement, min A without cane for balance  PT Education - 03/03/18 1649    Education provided  Yes    Education Details  dry needle hamstrings    Person(s) Educated  Patient    Methods  Explanation    Comprehension  Verbalized understanding       PT Short Term Goals - 02/17/18 2257      PT SHORT TERM GOAL #1   Title  = LTG        PT Long Term Goals - 02/17/18 1330      PT LONG TERM GOAL #1   Title  Pt will be independent with balance and LE strengthening HEP to further improve functional independence     Time  6    Period  Weeks    Status  On-going    Target Date  04/03/18      PT LONG TERM GOAL #2   Title  Pt will improve standing balance and BERG > 52/56 to decrease falls risk and improve functional mobility    Baseline  11/11: 52/56     Time  6    Period  Weeks    Status  New    Target Date  04/03/18      PT LONG TERM GOAL #3   Title  Pt will improve  gait velocity to >/= 2.62 ft/sec with LRAD indicating improvement in functional mobility     Baseline  11/11: 1.45 ft/sec with no AD; 1.77 ft/sec with SPC     Time  6    Period  Weeks    Status  New    Target Date  04/03/18      PT LONG TERM GOAL #4   Title  Pt will ambulate x 1000 over outdoor surfaces + curb (paved and grassy) MOD I with LRAD to improve functional mobility with community distances    Baseline  11/11: Pt ambulates community distances with SPC     Time  6    Period  Weeks    Status  On-going    Target Date  04/03/18      PT LONG TERM GOAL #5   Title  Pt will negotiate 12 steps with alternating stepping MOD I using single HR and SPC to improve functional mobility     Baseline  11/11; 12 steps alternating pattern with B HR's and therapist providing supervision    Time  6    Period  Weeks    Status  New    Target Date  04/03/18      PT LONG TERM GOAL #6   Title  Pt will demonstrate an FGA score of >/=19/30 to decrease fall risk potential with functional mobility using LRAD     Baseline  11/11: 11/30 indicating high fall risk potential with no AD    Time  6    Period  Weeks    Status  New    Target Date  04/03/18            Plan - 03/03/18 1650    Clinical Impression Statement  Continued to attempt endurance and strengthening on elliptical focusing on pushing forwards with LE, pt demonstrated improved endurance when therapist assisted with lateral weight shifting over LLE.  Also continued plyometric strengthening on trampoline and initiated agility training on floor ladder.  Will continue to address LE power and increased speed of movement as pt is able to tolerate.    Rehab Potential  Good    Clinical Impairments Affecting Rehab Potential  Pt currently  reports pain and tightness to mid thoracic region near surgical incision which is limiting his functional mobility     PT Frequency  3x / week    PT Duration  6 weeks    PT Treatment/Interventions  ADLs/Self  Care Home Management;Cryotherapy;Electrical Stimulation;Moist Heat;DME Instruction;Gait Network engineer;Therapeutic activities;Functional mobility training;Therapeutic exercise;Balance training;Neuromuscular re-education;Patient/family education;Orthotic Fit/Training;Passive range of motion;Taping;Aquatic Therapy;Dry needling;Manual techniques    PT Next Visit Plan  TDN for hamstrings, glute/piriformis?  L LE strengthening/ bioness with steering electrode, dynamic standing balance and gait training without cane-introduce obstacles, weaving/changes in direction - agility on floor ladder, postural training, plyometric training (trampoline)   AQUATIC visits scheduled for Dec. (by SD)   PT Home Exercise Plan  Reviewed Ai Chi postures given to pt on 02-21-18 - for pt to perform at [facility] in pool    Consulted and Agree with Plan of Care  Patient       Patient will benefit from skilled therapeutic intervention in order to improve the following deficits and impairments:  Abnormal gait, Decreased balance, Decreased coordination, Decreased strength, Difficulty walking, Increased muscle spasms, Impaired sensation, Pain, Impaired tone, Decreased range of motion  Visit Diagnosis: Muscle weakness (generalized)  Other abnormalities of gait and mobility  Unsteadiness  Other symptoms and signs involving the nervous system  Other disturbances of skin sensation  Paraplegia, incomplete (HCC)     Problem List Patient Active Problem List   Diagnosis Date Noted  . S/P lumbar laminectomy 12/27/2017  . Paraplegia (HCC) 11/07/2017  . Quadriplegia, unspecified (HCC) 11/07/2017  . Leukocytosis   . Neuropathic pain   . Drug induced constipation   . Urinary retention   . Class 1 obesity due to excess calories with serious comorbidity and body mass index (BMI) of 33.0 to 33.9 in adult   . Myelopathy (HCC) 08/01/2017  . Thoracic spinal stenosis 07/31/2017    Dierdre Highman, PT, DPT 03/03/18    4:57  PM    [facility] [address] [place], [place], [address] Phone: [phone]   Fax:  [phone]  Name: Scott Lane MRN: [id number] Date of Birth: 12/17/1985

## 2018-03-03 NOTE — Telephone Encounter (Signed)
Called to check PA requirements. This ICD-10 does require auth. The number for authorization is, [phone].

## 2018-03-04 ENCOUNTER — Ambulatory Visit: Payer: 59 | Admitting: Physical Therapy

## 2018-03-04 NOTE — Telephone Encounter (Signed)
I called and started authorization for this patient, the representative stated that it did not require authorization. We can do a Pre D the fax number to send the clinicals to is [phone] is the phone number to call and check status of the Pre D. DW

## 2018-03-05 ENCOUNTER — Ambulatory Visit: Payer: Self-pay | Admitting: Neurology

## 2018-03-05 ENCOUNTER — Ambulatory Visit: Payer: 59 | Admitting: Physical Therapy

## 2018-03-05 ENCOUNTER — Encounter: Payer: Self-pay | Admitting: Physical Therapy

## 2018-03-05 DIAGNOSIS — R29818 Other symptoms and signs involving the nervous system: Secondary | ICD-10-CM | POA: Diagnosis not present

## 2018-03-05 DIAGNOSIS — G8222 Paraplegia, incomplete: Secondary | ICD-10-CM

## 2018-03-05 DIAGNOSIS — R208 Other disturbances of skin sensation: Secondary | ICD-10-CM | POA: Diagnosis not present

## 2018-03-05 DIAGNOSIS — R2681 Unsteadiness on feet: Secondary | ICD-10-CM | POA: Diagnosis not present

## 2018-03-05 DIAGNOSIS — M6281 Muscle weakness (generalized): Secondary | ICD-10-CM | POA: Diagnosis not present

## 2018-03-05 DIAGNOSIS — R2689 Other abnormalities of gait and mobility: Secondary | ICD-10-CM

## 2018-03-05 DIAGNOSIS — M546 Pain in thoracic spine: Secondary | ICD-10-CM | POA: Diagnosis not present

## 2018-03-05 NOTE — Therapy (Signed)
[facility] [address] [place], [place], [address] Phone: [phone]   Fax:  [phone]  Physical Therapy Treatment  Patient Details  Name: Scott Lane MRN: [id number] Date of Birth: [birth year] Referring Provider (PT): Marikay Alaravid Jones, MD   Encounter Date: 03/05/2018  PT End of Session - 03/05/18 0831    Visit Number  22    Number of Visits  28    Date for PT Re-Evaluation  04/03/18    Authorization Type  [employer] Deductible.  VL: [place]    PT Start Time  0805    PT Stop Time  0845    PT Time Calculation (min)  40 min    Activity Tolerance  Patient tolerated treatment well    Behavior During Therapy  WFL for tasks assessed/performed       Past Medical History:  Diagnosis Date  . Chicken pox     Past Surgical History:  Procedure Laterality Date  . LUMBAR LAMINECTOMY/DECOMPRESSION MICRODISCECTOMY N/A 08/01/2017   Procedure: Thoracic nine, ten, eleven, twelve laminectomy;  Surgeon: Tia AlertJones, David S, MD;  [facility];  Service: Neurosurgery;  Laterality: N/A;  . none    . THORACIC DISCECTOMY N/A 12/27/2017   Procedure: Laminectomy and Foraminotomy - Thoracic one - Thoracic three;  Surgeon: Tia AlertJones, David S, MD;  [facility];  Service: Neurosurgery;  Laterality: N/A;  . WISDOM TOOTH EXTRACTION     bilateral lower.    There were no vitals filed for this visit.  Subjective Assessment - 03/05/18 0829    Subjective  reports some HS soreness/tightness this morning; plans to go to the pool this evening    Patient Stated Goals  Ambulation with no AD to get back to prior level of function.     Currently in Pain?  Yes    Pain Score  2     Pain Location  Leg    Pain Orientation  Left    Pain Descriptors / Indicators  Tightness;Sore                       [facility] Adult PT Treatment/Exercise - 03/05/18 0001      Exercises   Exercises  Knee/Hip;Other Exercises    Other Exercises   resisted  fwd/lateral/side stepping with red tband in // bars       Knee/Hip Exercises: Standing   Hip Flexion  Stengthening;Left;Right;2 sets;10 reps;Knee bent    Hip Flexion Limitations  red tband at feet - in // bars    Other Standing Knee Exercises  agility ladder - fwd, lateral (bilateral), 2 in/2 out. icky shuffle - all with cane - cueing to focus on target foot placement and balance    Other Standing Knee Exercises  resisted HS curl - red tband 2 x 10 reps bilaterally - in // bars      Manual Therapy   Manual Therapy  Soft tissue mobilization;Myofascial release;Passive ROM    Manual therapy comments  skilled palpation with TDN    Soft tissue mobilization  STM L L HS group - noted palpable tightness in lateral hamstrings    Myofascial Release  manual trigger point release to L lateral hamstring    Passive ROM  L HS manual stretch 3 x 60 sec with overpressure into DF       Trigger Point Dry Needling - 03/05/18 0834    Consent Given?  Yes    Muscles Treated Lower Body  Hamstring    Hamstring Response  Twitch response elicited;Palpable increased muscle length             PT Short Term Goals - 02/17/18 2257      PT SHORT TERM GOAL #1   Title  = LTG        PT Long Term Goals - 02/17/18 1330      PT LONG TERM GOAL #1   Title  Pt will be independent with balance and LE strengthening HEP to further improve functional independence     Time  6    Period  Weeks    Status  On-going    Target Date  04/03/18      PT LONG TERM GOAL #2   Title  Pt will improve standing balance and BERG > 52/56 to decrease falls risk and improve functional mobility    Baseline  11/11: 52/56     Time  6    Period  Weeks    Status  New    Target Date  04/03/18      PT LONG TERM GOAL #3   Title  Pt will improve gait velocity to >/= 2.62 ft/sec with LRAD indicating improvement in functional mobility     Baseline  11/11: 1.45 ft/sec with no AD; 1.77 ft/sec with SPC     Time  6    Period  Weeks     Status  New    Target Date  04/03/18      PT LONG TERM GOAL #4   Title  Pt will ambulate x 1000 over outdoor surfaces + curb (paved and grassy) MOD I with LRAD to improve functional mobility with community distances    Baseline  11/11: Pt ambulates community distances with SPC     Time  6    Period  Weeks    Status  On-going    Target Date  04/03/18      PT LONG TERM GOAL #5   Title  Pt will negotiate 12 steps with alternating stepping MOD I using single HR and SPC to improve functional mobility     Baseline  11/11; 12 steps alternating pattern with B HR's and therapist providing supervision    Time  6    Period  Weeks    Status  New    Target Date  04/03/18      PT LONG TERM GOAL #6   Title  Pt will demonstrate an FGA score of >/=19/30 to decrease fall risk potential with functional mobility using LRAD     Baseline  11/11: 11/30 indicating high fall risk potential with no AD    Time  6    Period  Weeks    Status  New    Target Date  04/03/18            Plan - 03/05/18 0831    Clinical Impression Statement  Scott Lane doing well - TDN to L lateral hamstring with excellent twitch response and reduced tissue tension. Remainder of session spent on promoting improve strength, endurance, step length and coordination. Requires cueing to forcus on target of LE for foot clearance. Will continue to progress towards goals.     Rehab Potential  Good    Clinical Impairments Affecting Rehab Potential  Pt currently reports pain and tightness to mid thoracic region near surgical incision which is limiting his functional mobility     PT Frequency  3x / week    PT Duration  6 weeks    PT Treatment/Interventions  ADLs/Self Care Home Management;Cryotherapy;Electrical Stimulation;Moist Heat;DME Instruction;Gait Network engineer;Therapeutic activities;Functional mobility training;Therapeutic exercise;Balance training;Neuromuscular re-education;Patient/family education;Orthotic Fit/Training;Passive  range of motion;Taping;Aquatic Therapy;Dry needling;Manual techniques    PT Next Visit Plan  L LE strengthening/ bioness with steering electrode, dynamic standing balance and gait training without cane-introduce obstacles, weaving/changes in direction - agility on floor ladder, postural training, plyometric training (trampoline)   AQUATIC visits scheduled for Dec. (by SD)   PT Home Exercise Plan  Reviewed Ai Chi postures given to pt on 02-21-18 - for pt to perform at [facility] in pool    Consulted and Agree with Plan of Care  Patient       Patient will benefit from skilled therapeutic intervention in order to improve the following deficits and impairments:  Abnormal gait, Decreased balance, Decreased coordination, Decreased strength, Difficulty walking, Increased muscle spasms, Impaired sensation, Pain, Impaired tone, Decreased range of motion  Visit Diagnosis: Muscle weakness (generalized)  Other abnormalities of gait and mobility  Unsteadiness  Other symptoms and signs involving the nervous system  Other disturbances of skin sensation  Paraplegia, incomplete (HCC)     Problem List Patient Active Problem List   Diagnosis Date Noted  . S/P lumbar laminectomy 12/27/2017  . Paraplegia (HCC) 11/07/2017  . Quadriplegia, unspecified (HCC) 11/07/2017  . Leukocytosis   . Neuropathic pain   . Drug induced constipation   . Urinary retention   . Class 1 obesity due to excess calories with serious comorbidity and body mass index (BMI) of 33.0 to 33.9 in adult   . Myelopathy (HCC) 08/01/2017  . Thoracic spinal stenosis 07/31/2017    Kipp Laurence, PT, DPT Supplemental Physical Therapist 03/05/18 9:46 AM Pager: [phone] Office: [phone]  [facility] [address] [place], [place], [address] Phone: [phone]   Fax:  [phone]  Name: Scott Lane MRN: [id number] Date of Birth: May 30, 1985

## 2018-03-10 ENCOUNTER — Telehealth: Payer: Self-pay | Admitting: Physical Therapy

## 2018-03-10 ENCOUNTER — Ambulatory Visit: Payer: 59 | Attending: Physical Medicine & Rehabilitation | Admitting: Physical Therapy

## 2018-03-10 ENCOUNTER — Encounter: Payer: Self-pay | Admitting: Physical Therapy

## 2018-03-10 DIAGNOSIS — R2689 Other abnormalities of gait and mobility: Secondary | ICD-10-CM | POA: Insufficient documentation

## 2018-03-10 DIAGNOSIS — G8222 Paraplegia, incomplete: Secondary | ICD-10-CM | POA: Insufficient documentation

## 2018-03-10 DIAGNOSIS — R2681 Unsteadiness on feet: Secondary | ICD-10-CM | POA: Diagnosis not present

## 2018-03-10 DIAGNOSIS — R29818 Other symptoms and signs involving the nervous system: Secondary | ICD-10-CM | POA: Insufficient documentation

## 2018-03-10 DIAGNOSIS — R208 Other disturbances of skin sensation: Secondary | ICD-10-CM | POA: Insufficient documentation

## 2018-03-10 DIAGNOSIS — M6281 Muscle weakness (generalized): Secondary | ICD-10-CM | POA: Insufficient documentation

## 2018-03-10 NOTE — Therapy (Signed)
[facility] [address] [place], [place], [address] Phone: [phone]   Fax:  [phone]  Physical Therapy Treatment  Patient Details  Name: Scott Lane MRN: [id number] Date of Birth: 1985/05/28 Referring Provider (PT): Marikay Alar, MD   Encounter Date: 03/10/2018  PT End of Session - 03/10/18 0957    Visit Number  23    Number of Visits  28    Date for PT Re-Evaluation  04/03/18    Authorization Type  [employer] Deductible.  VL: MN    PT Start Time  0808   pt arrived late   PT Stop Time  0847    PT Time Calculation (min)  39 min    Activity Tolerance  Patient tolerated treatment well    Behavior During Therapy  [facility] for tasks assessed/performed       Past Medical History:  Diagnosis Date  . Chicken pox     Past Surgical History:  Procedure Laterality Date  . LUMBAR LAMINECTOMY/DECOMPRESSION MICRODISCECTOMY N/A 08/01/2017   Procedure: Thoracic nine, ten, eleven, twelve laminectomy;  Surgeon: Tia Alert, MD;  [facility] OR;  Service: Neurosurgery;  Laterality: N/A;  . none    . THORACIC DISCECTOMY N/A 12/27/2017   Procedure: Laminectomy and Foraminotomy - Thoracic one - Thoracic three;  Surgeon: Tia Alert, MD;  Location: [facility] OR;  Service: Neurosurgery;  Laterality: N/A;  . WISDOM TOOTH EXTRACTION     bilateral lower.    There were no vitals filed for this visit.  Subjective Assessment - 03/10/18 0811    Subjective  LE felt good after dry needling, still going to the pool on his own 3x/week    Pertinent History  Patient enjoys maintaining a very active life style     Limitations  Standing;Walking;Lifting;Other (comment)    How long can you stand comfortably?  8-10 minutes    How long can you walk comfortably?  uses cane and RW household distance, RW for community distances    Patient Stated Goals  Ambulation with no AD to get back to prior level of function.     Currently in Pain?   No/denies                       [facility] Adult PT Treatment/Exercise - 03/10/18 0849      Ambulation/Gait   Ambulation/Gait  Yes    Ambulation/Gait Assistance  4: Min guard;4: Min assist    Ambulation/Gait Assistance Details  obstacle negotiation beginning with low obstacles (walking stick) progressing to higher obstacles 4" box and 4" hurdle (5 obstacles) x 4 laps attempting to perform without use of AD to focus on SLS, hip and knee flexion, increased step length and weight shifting.  Pt able to perform lower obstacles without use of AD but required UE support and min A from therapist when stepping over higher obstacles.  Pt demonstrates greater difficulty leading with LLE and shifting weight anteriorly and to the L when leading with LLE due to ongoing hamstring weakness.  Also demonstrates circumduction of LLE over obstacles and required cues for increased use of hip flexion.  Pt reports more difficulty with balance when advancing LLE first    Assistive device  None;Straight cane    Ambulation Surface  Other (comment)   over obstacles     Knee/Hip Exercises: Aerobic   Tread Mill  x 8 minutes total: forwards with UE support at 1.2 mph focusing on increasing  gait speed, increased speed of advancing LLE and hamstring activation progressing to ambulation without UE support but at slower speed: 0.7 - 0.8 mph with pt self correcting increased stance time on LLE, step length on RLE with 2 standing rest breaks due to hamstring fatigue.  For final 2 minutes pt performed reverse walking on treadmill at 0.5-0.6 mph with UE support to focus on hamstring activation      Knee/Hip Exercises: Standing   Other Standing Knee Exercises  green theraband resisted hamstring curls with controlled advancement forwards and open chain hip extension x 8 reps on LLE only to simulate hamstring activation during gait cycle             PT Education - 03/10/18 0953    Education provided  Yes    Education  Details  ongoing weakness in hamstring; pt asking if hand held massager would help with LE activation/stiffness - discussed use for mm relaxation but unknown if it would improve activation    Person(s) Educated  Patient    Methods  Explanation    Comprehension  Verbalized understanding       PT Short Term Goals - 02/17/18 2257      PT SHORT TERM GOAL #1   Title  = LTG        PT Long Term Goals - 02/17/18 1330      PT LONG TERM GOAL #1   Title  Pt will be independent with balance and LE strengthening HEP to further improve functional independence     Time  6    Period  Weeks    Status  On-going    Target Date  04/03/18      PT LONG TERM GOAL #2   Title  Pt will improve standing balance and BERG > 52/56 to decrease falls risk and improve functional mobility    Baseline  11/11: 52/56     Time  6    Period  Weeks    Status  New    Target Date  04/03/18      PT LONG TERM GOAL #3   Title  Pt will improve gait velocity to >/= 2.62 ft/sec with LRAD indicating improvement in functional mobility     Baseline  11/11: 1.45 ft/sec with no AD; 1.77 ft/sec with SPC     Time  6    Period  Weeks    Status  New    Target Date  04/03/18      PT LONG TERM GOAL #4   Title  Pt will ambulate x 1000 over outdoor surfaces + curb (paved and grassy) MOD I with LRAD to improve functional mobility with community distances    Baseline  11/11: Pt ambulates community distances with SPC     Time  6    Period  Weeks    Status  On-going    Target Date  04/03/18      PT LONG TERM GOAL #5   Title  Pt will negotiate 12 steps with alternating stepping MOD I using single HR and SPC to improve functional mobility     Baseline  11/11; 12 steps alternating pattern with B HR's and therapist providing supervision    Time  6    Period  Weeks    Status  New    Target Date  04/03/18      PT LONG TERM GOAL #6   Title  Pt will demonstrate an FGA score of >/=19/30 to decrease fall  risk potential with  functional mobility using LRAD     Baseline  11/11: 11/30 indicating high fall risk potential with no AD    Time  6    Period  Weeks    Status  New    Target Date  04/03/18            Plan - 03/10/18 1610    Clinical Impression Statement  Continued to focus on gait training without UE support with focus on increasing gait speed and obstacle negotiation.  Pt does not experience knee flexion moment when weight shifting onto LLE but does continue to exhibit hamstring weakness and delay in hamstring activation in closed and open chain during gait cycle.  Will continue to address on land and with aquatic exercises.    Rehab Potential  Good    Clinical Impairments Affecting Rehab Potential  Pt currently reports pain and tightness to mid thoracic region near surgical incision which is limiting his functional mobility     PT Frequency  3x / week    PT Duration  6 weeks    PT Treatment/Interventions  ADLs/Self Care Home Management;Cryotherapy;Electrical Stimulation;Moist Heat;DME Instruction;Gait Network engineer;Therapeutic activities;Functional mobility training;Therapeutic exercise;Balance training;Neuromuscular re-education;Patient/family education;Orthotic Fit/Training;Passive range of motion;Taping;Aquatic Therapy;Dry needling;Manual techniques    PT Next Visit Plan  continues to need significant L hamstring (open and closed chain) strengthening and training; dynamic standing balance and gait training without cane-introduce obstacles, weaving/changes in direction - agility on floor ladder, postural training, plyometric training (trampoline)   AQUATIC visits scheduled for Dec. (by SD)   PT Home Exercise Plan  Reviewed Ai Chi postures given to pt on 02-21-18 - for pt to perform at [facility] in pool    Consulted and Agree with Plan of Care  Patient       Patient will benefit from skilled therapeutic intervention in order to improve the following deficits and impairments:  Abnormal gait,  Decreased balance, Decreased coordination, Decreased strength, Difficulty walking, Increased muscle spasms, Impaired sensation, Pain, Impaired tone, Decreased range of motion  Visit Diagnosis: Muscle weakness (generalized)  Other abnormalities of gait and mobility  Unsteadiness  Other symptoms and signs involving the nervous system  Other disturbances of skin sensation  Paraplegia, incomplete (HCC)     Problem List Patient Active Problem List   Diagnosis Date Noted  . S/P lumbar laminectomy 12/27/2017  . Paraplegia (HCC) 11/07/2017  . Quadriplegia, unspecified (HCC) 11/07/2017  . Leukocytosis   . Neuropathic pain   . Drug induced constipation   . Urinary retention   . Class 1 obesity due to excess calories with serious comorbidity and body mass index (BMI) of 33.0 to 33.9 in adult   . Myelopathy (HCC) 08/01/2017  . Thoracic spinal stenosis 07/31/2017    Dierdre Highman, PT, DPT 03/10/18    10:02 AM    [facility] [address] [place], [place], [address] Phone: [phone]   Fax:  [phone]  Name: CREW GOREN MRN: [id number] Date of Birth: 1986-02-12

## 2018-03-10 NOTE — Telephone Encounter (Signed)
Hello Dr. Terrace ArabiaYan, I believe [name] is scheduled to return to you tomorrow for another Botox injection.  Since your first assessment of him and first Botox injection [name] had a thoracic decompression surgery on 12/27/2017.  Following this surgery we have seen a big improvement in Scott Lane's spasticity and active movement in his LLE.  He also has been participating in dry needling to improve ROM.  Due to these improvements he may benefit from a re-assessment of current spasticity.  He does seem to have continued spasticity in L gastroc and hamstrings but reports improvement in the "stiffness" he feels in his L quad.  Just wanted to let you know what we are seeing functionally in therapy.  Thank you! Dierdre HighmanAudra F Hondo Nanda, PT, DPT 03/10/18    12:42 PM

## 2018-03-11 ENCOUNTER — Encounter: Payer: Self-pay | Admitting: Neurology

## 2018-03-11 ENCOUNTER — Ambulatory Visit (INDEPENDENT_AMBULATORY_CARE_PROVIDER_SITE_OTHER): Payer: 59 | Admitting: Neurology

## 2018-03-11 VITALS — BP 129/76 | HR 86 | Ht 72.0 in | Wt 254.0 lb

## 2018-03-11 DIAGNOSIS — G8250 Quadriplegia, unspecified: Secondary | ICD-10-CM

## 2018-03-11 NOTE — Progress Notes (Signed)
PATIENT: Scott Lane DOB: 1986/02/14  Chief Complaint  Patient presents with  . Quadriplegia    Xeomin - office supply     HISTORICAL  Scott Lane is 32 years old male, seen in request by his primary care physician Dr.Stephen Durene CalHunter for evaluation of Botox injection for spastic left lower extremity, He is accompanied by his wife, initial evaluation was on November 07, 2017.  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, used to work out regularly, in early April 2019, during the workout, he felt lower extremity weakness, left leg worse than the right side, difficulty clear left leg from the floor, on July 31, 2017, [place], he was throwing a ball to his partner, then had sudden onset dense numbness at bilateral lower extremity, collapsed to the floor, lower extremity weakness, I personally reviewed film on July 31, 2017 prior to his decompression surgery  MRI thoracic spine demonstrated mild congenital narrowing of the central canal throughout due to short pedicle length, severe central canal stenosis at T9-10 due to shallow disc bulging bulky ligamentum flavum thickening, edema of the cord at this level, also marked central canal stenosis at T11-12, ligamentum flavum thickening and shallow disc bulging at T2-3, causing moderate to severe central canal stenosis,  MRI of lumbar spine congenital lateral central spinal canal throughout, broad-based right paracentral protrusion at L4-5, impinging on descending right L5 nerve roots, moderate right foraminal narrowing, moderate to severe bilateral foraminal narrowing at L5-S1.  MRI of cervical spine, congenital canal narrowing, no significant canal or foraminal narrowing,  He underwent decompressive thoracic laminectomy, medial facetectomy T9-10 and T10-11 T11-12 by Marikay Alaravid Jones, MD  Post surgically,he had significant recovery with ongoing physical therapy, but he still has significant bilateral lower extremity spasticity, left worse than right,  difficulty clearing left leg from floor, mild urinary spasticity, hesitant to start  UPDATE December 02 2017: He has significant spasticity of left lower extremity, tried baclofen with tizanidine, cause drowsiness without provide significant help.   He has appointment with neurosurgeon in September 2019, consider upper thoracic decompression surgery,  Today is his first electrical stimulation guided xeomin injection for spastic left lower extremity,  UPDATE Mar 11 2018: We used 300 units of Xeomin for his initial injection December 02, 2017, he only noticed transient short lasting improvement, we are going to increase the dosage to 600 units today.  REVIEW OF SYSTEMS: Full 14 system review of systems performed and notable only for as above  ALLERGIES: No Known Allergies  HOME MEDICATIONS: Current Outpatient Medications  Medication Sig Dispense Refill  . B Complex Vitamins (B COMPLEX PO) Take 1 tablet by mouth daily.    . baclofen (LIORESAL) 20 MG tablet Take 20 mg by mouth 3 (three) times daily.     Marland Kitchen. HYDROcodone-acetaminophen (NORCO) 7.5-325 MG tablet Take 1-2 tablets by mouth every 4 (four) hours as needed for moderate pain. 60 tablet 0  . IncobotulinumtoxinA (XEOMIN IM) Inject into the muscle every 3 (three) months.    . lidocaine (LIDODERM) 5 % Place 1 patch onto the skin daily as needed (pain). Remove & Discard patch within 12 hours or as directed by MD    . Multiple Vitamins-Minerals (ONE DAILY MULTIVITAMIN MEN PO) Take 1 tablet by mouth daily.    Marland Kitchen. tiZANidine (ZANAFLEX) 4 MG tablet Take 1 tablet (4 mg total) by mouth 3 (three) times daily. 90 tablet 6   No current facility-administered medications for this visit.     PAST MEDICAL HISTORY:  Past Medical History:  Diagnosis Date  . Chicken pox     PAST SURGICAL HISTORY: Past Surgical History:  Procedure Laterality Date  . LUMBAR LAMINECTOMY/DECOMPRESSION MICRODISCECTOMY N/A 08/01/2017   Procedure: Thoracic nine, ten, eleven,  twelve laminectomy;  Surgeon: Tia Alert, MD;  Location: [facility] OR;  Service: Neurosurgery;  Laterality: N/A;  . none    . THORACIC DISCECTOMY N/A 12/27/2017   Procedure: Laminectomy and Foraminotomy - Thoracic one - Thoracic three;  Surgeon: Tia Alert, MD;  Location: [facility] OR;  Service: Neurosurgery;  Laterality: N/A;  . WISDOM TOOTH EXTRACTION     bilateral lower.    FAMILY HISTORY: Family History  Problem Relation Age of Onset  . Hypertension Mother   . Diabetes Mother   . Hypertension Father   . Lung cancer Father        smoker  . Hypertension Brother   . Hypertension Maternal Grandmother   . Lung cancer Maternal Grandmother        non smoker  . Hypertension Paternal Grandmother     SOCIAL HISTORY: Social History   Socioeconomic History  . Marital status: Married    Spouse name: Not on file  . Number of children: Not on file  . Years of education: Not on file  . Highest education level: Not on file  Occupational History  . Not on file  Social Needs  . Financial resource strain: Not on file  . Food insecurity:    Worry: Not on file    Inability: Not on file  . Transportation needs:    Medical: Not on file    Non-medical: Not on file  Tobacco Use  . Smoking status: Never Smoker  . Smokeless tobacco: Never Used  Substance and Sexual Activity  . Alcohol use: Yes    Comment: occasional on weekend  . Drug use: No  . Sexual activity: Yes  Lifestyle  . Physical activity:    Days per week: Not on file    Minutes per session: Not on file  . Stress: Not on file  Relationships  . Social connections:    Talks on phone: Not on file    Gets together: Not on file    Attends religious service: Not on file    Active member of club or organization: Not on file    Attends meetings of clubs or organizations: Not on file    Relationship status: Not on file  . Intimate partner violence:    Fear of current or ex partner: Not on file    Emotionally abused: Not on file     Physically abused: Not on file    Forced sexual activity: Not on file  Other Topics Concern  . Not on file  Social History Narrative   Married 2018. Wife pregnant with first child- daughter.    Wife works IT with [employer].       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      PHYSICAL EXAM   Vitals:   03/11/18 1058  BP: 129/76  Pulse: 86  Weight: 254 lb (115.2 kg)  Height: 6' (1.829 m)    Not recorded      Body mass index is 34.45 kg/m.  PHYSICAL EXAMNIATION:  Gen: NAD, conversant, well nourised, obese, well groomed  Cardiovascular: Regular rate rhythm, no peripheral edema, warm, nontender. Eyes: Conjunctivae clear without exudates or hemorrhage Neck: Supple, no carotid bruits. Pulmonary: Clear to auscultation bilaterally   NEUROLOGICAL EXAM:  MENTAL STATUS: Speech:    Speech is normal; fluent and spontaneous with normal comprehension.  Cognition:     Orientation to time, place and person     Normal recent and remote memory     Normal Attention span and concentration     Normal Language, naming, repeating,spontaneous speech     Fund of knowledge   CRANIAL NERVES: CN II: Visual fields are full to confrontation. Fundoscopic exam is normal with sharp discs and no vascular changes. Pupils are round equal and briskly reactive to light. CN III, IV, VI: extraocular movement are normal. No ptosis. CN V: Facial sensation is intact to pinprick in all 3 divisions bilaterally. Corneal responses are intact.  CN VII: Face is symmetric with normal eye closure and smile. CN VIII: Hearing is normal to rubbing fingers CN IX, X: Palate elevates symmetrically. Phonation is normal. CN XI: Head turning and shoulder shrug are intact CN XII: Tongue is midline with normal movements and no atrophy.  MOTOR: Bilateral  upper extremities normal, bilateral lower extremity spasticity, mild on the right side, mild to moderate on the left side, there was no significant muscle weakness,  REFLEXES: Reflexes are 2+ and symmetric at the biceps, triceps, 3 knees, and ankles, with sustained ankle clonus on the left side. Plantar responses are extensor bilaterally  SENSORY: Intact to light touch, pinprick, positional sensation and vibratory sensation are intact in fingers and toes.  COORDINATION: Rapid alternating movements and fine finger movements are intact. There is no dysmetria on finger-to-nose and heel-knee-shin.    GAIT/STANCE: He needs push up to get up from seated position, cautious, stiff, dragging left leg.  DIAGNOSTIC DATA (LABS, IMAGING, TESTING) - I reviewed patient records, labs, notes, testing and imaging myself where available.   ASSESSMENT AND PLAN  THALES KNIPPLE is a 32 y.o. male   Thoracic myelopathy with residue spastic paraplegia   Left worse than right, first electrical stimulation guided xeomin injection for spastic left lower extremity on December 02, 2017,   We used xeomin 600 units daily   Left adductor longus 100 units  Left adductor magnus 100 units  Left semimembranosus 50 units  Left semitendinosus 50 units  Left vastus lateralis 50 units  Left vastus medialis 50 units  Left rectus femoris 50 units   Left tibialis posterior 50 units  Left flexor digitorum longus 50 units  Left medial gastrocnemius 50 units   Levert Feinstein, M.D. Ph.D.  [facility] [address] [place], [place] [address] Ph: [phone] Fax: [phone]  CC: Referring Provider

## 2018-03-11 NOTE — Progress Notes (Signed)
**  Xeomin 100 units x 6 vials, NDC 0259-1610-01, Lot [id number], Exp 07/2020, office supply.//mck,rn** 

## 2018-03-12 ENCOUNTER — Ambulatory Visit: Payer: 59 | Admitting: Physical Therapy

## 2018-03-12 DIAGNOSIS — G8250 Quadriplegia, unspecified: Secondary | ICD-10-CM | POA: Diagnosis not present

## 2018-03-12 DIAGNOSIS — R2681 Unsteadiness on feet: Secondary | ICD-10-CM | POA: Diagnosis not present

## 2018-03-12 DIAGNOSIS — R2689 Other abnormalities of gait and mobility: Secondary | ICD-10-CM | POA: Diagnosis not present

## 2018-03-12 DIAGNOSIS — M6281 Muscle weakness (generalized): Secondary | ICD-10-CM

## 2018-03-12 DIAGNOSIS — R208 Other disturbances of skin sensation: Secondary | ICD-10-CM | POA: Diagnosis not present

## 2018-03-12 DIAGNOSIS — R29818 Other symptoms and signs involving the nervous system: Secondary | ICD-10-CM | POA: Diagnosis not present

## 2018-03-12 DIAGNOSIS — G8222 Paraplegia, incomplete: Secondary | ICD-10-CM | POA: Diagnosis not present

## 2018-03-12 MED ORDER — INCOBOTULINUMTOXINA 100 UNITS IM SOLR
600.0000 [IU] | INTRAMUSCULAR | Status: DC
  Administered 2018-03-12: 600 [IU] via INTRAMUSCULAR

## 2018-03-13 NOTE — Therapy (Signed)
[facility] [address] [place], [place], [address] Phone: [phone]   Fax:  [phone]  Physical Therapy Treatment  Patient Details  Name: Scott Lane MRN: [id number] Date of Birth: January 01, 1986 Referring Provider (PT): Marikay Alar, MD   Encounter Date: 03/12/2018  PT End of Session - 03/13/18 2052    Visit Number  24    Number of Visits  28    Date for PT Re-Evaluation  04/03/18    Authorization Type  [place] Family Deductible.  VL: MN    PT Start Time  1505    PT Stop Time  1550    PT Time Calculation (min)  45 min       Past Medical History:  Diagnosis Date  . Chicken pox     Past Surgical History:  Procedure Laterality Date  . LUMBAR LAMINECTOMY/DECOMPRESSION MICRODISCECTOMY N/A 08/01/2017   Procedure: Thoracic nine, ten, eleven, twelve laminectomy;  Surgeon: Tia Alert, MD;  Location: [facility] OR;  Service: Neurosurgery;  Laterality: N/A;  . none    . THORACIC DISCECTOMY N/A 12/27/2017   Procedure: Laminectomy and Foraminotomy - Thoracic one - Thoracic three;  Surgeon: Tia Alert, MD;  Location: [facility] OR;  Service: Neurosurgery;  Laterality: N/A;  . WISDOM TOOTH EXTRACTION     bilateral lower.    There were no vitals filed for this visit.  Subjective Assessment - 03/13/18 2048    Subjective  Pt states he has been going to [employer] to the pool to do his aquatic exercise program;  reports he received Botox injections yesterday (in left quad mostly)    Pertinent History  Patient enjoys maintaining a very active life style     Limitations  Standing;Walking;Lifting;Other (comment)    How long can you stand comfortably?  8-10 minutes    Patient Stated Goals  Ambulation with no AD to get back to prior level of function.     Currently in Pain?  No/denies       Aquatic therapy;  Pool temp 87.2 degrees Patient seen for aquatic therapy today.  Treatment took place in water 3.5-4 feet deep depending upon  activity.  Pt entered and exited   the pool via step negotiation modified independently using hand rails.    LLE stretches - runner's stretch x 30 sec hold and gastroc stretch with bil. forefeet on pool wall - 30 sec hold x 1 rep; Lt hamstring stretch with foot on wall With contract/relax for increased stretch   Pt performed LLE strengthening with use of ankle cuff flotation weight - hip flexion, abduction and extension 10 reps each;  Lt hip flexion/extension with knee flexed at 90 degrees with UE support on noodle for assistance with balance; hip abduction in standing with knee flexed at 90 degrees With use of cuff weight  Pt performed marching in place  - slowly - for improved SLS on each leg 10 reps; 10 reps quickly to use viscosity of water for resistance and challenge with standing balance; progressed to marching forward/backward full length of pool (100') x 1 rep each  Pt performed kicking across length of pool - use of noodle for assistance with flotation as arms not used in order to increase LE AROM, strength and coordination  Jumping jacks 10 reps; cross country skiing 10 reps Jogging across pool 100' x 1 rep with cues to lift legs big for increased resistance using current and laminar flow  Pt requires buoyancy of water for  support and to prevent fall with high level balance and ballistic movements which are unable to be safely performed on land                     PT Short Term Goals - 02/17/18 2257      PT SHORT TERM GOAL #1   Title  = LTG        PT Long Term Goals - 02/17/18 1330      PT LONG TERM GOAL #1   Title  Pt will be independent with balance and LE strengthening HEP to further improve functional independence     Time  6    Period  Weeks    Status  On-going    Target Date  04/03/18      PT LONG TERM GOAL #2   Title  Pt will improve standing balance and BERG > 52/56 to decrease falls risk and improve functional mobility    Baseline   11/11: 52/56     Time  6    Period  Weeks    Status  New    Target Date  04/03/18      PT LONG TERM GOAL #3   Title  Pt will improve gait velocity to >/= 2.62 ft/sec with LRAD indicating improvement in functional mobility     Baseline  11/11: 1.45 ft/sec with no AD; 1.77 ft/sec with SPC     Time  6    Period  Weeks    Status  New    Target Date  04/03/18      PT LONG TERM GOAL #4   Title  Pt will ambulate x 1000 over outdoor surfaces + curb (paved and grassy) MOD I with LRAD to improve functional mobility with community distances    Baseline  11/11: Pt ambulates community distances with SPC     Time  6    Period  Weeks    Status  On-going    Target Date  04/03/18      PT LONG TERM GOAL #5   Title  Pt will negotiate 12 steps with alternating stepping MOD I using single HR and SPC to improve functional mobility     Baseline  11/11; 12 steps alternating pattern with B HR's and therapist providing supervision    Time  6    Period  Weeks    Status  New    Target Date  04/03/18      PT LONG TERM GOAL #6   Title  Pt will demonstrate an FGA score of >/=19/30 to decrease fall risk potential with functional mobility using LRAD     Baseline  11/11: 11/30 indicating high fall risk potential with no AD    Time  6    Period  Weeks    Status  New    Target Date  04/03/18            Plan - 03/13/18 2053    Clinical Impression Statement  Pt performing aquatic exercise well with improved balance noted; pt able to perform standing exercises on LLE with less UE support needed than in prior PT aquatic sessions;  pt does continue to have LOB with decr. core stabilization noted with high level aquatic exercises but is improving with increasing LLE strength and increasing speed of movements, i.e. Lt knee flexion/extension in standing.      Clinical Impairments Affecting Rehab Potential  Pt currently reports pain and tightness to mid thoracic  region near surgical incision which is limiting his  functional mobility     PT Frequency  3x / week    PT Duration  6 weeks    PT Treatment/Interventions  ADLs/Self Care Home Management;Cryotherapy;Electrical Stimulation;Moist Heat;DME Instruction;Gait Network engineertraining;Stair training;Therapeutic activities;Functional mobility training;Therapeutic exercise;Balance training;Neuromuscular re-education;Patient/family education;Orthotic Fit/Training;Passive range of motion;Taping;Aquatic Therapy;Dry needling;Manual techniques    PT Next Visit Plan  continues to need significant L hamstring (open and closed chain) strengthening and training; dynamic standing balance and gait training without cane-introduce obstacles, weaving/changes in direction - agility on floor ladder, postural training, plyometric training (trampoline)    PT Home Exercise Plan  Reviewed Ai Chi postures given to pt on 02-21-18 - [facility]    Consulted and Agree with Plan of Care  Patient       Patient will benefit from skilled therapeutic intervention in order to improve the following deficits and impairments:  Abnormal gait, Decreased balance, Decreased coordination, Decreased strength, Difficulty walking, Increased muscle spasms, Impaired sensation, Pain, Impaired tone, Decreased range of motion  Visit Diagnosis: Other abnormalities of gait and mobility  Muscle weakness (generalized)  Other symptoms and signs involving the nervous system     Problem List Patient Active Problem List   Diagnosis Date Noted  . S/P lumbar laminectomy 12/27/2017  . Paraplegia (HCC) 11/07/2017  . Quadriplegia, unspecified (HCC) 11/07/2017  . Leukocytosis   . Neuropathic pain   . Drug induced constipation   . Urinary retention   . Class 1 obesity due to excess calories with serious comorbidity and body mass index (BMI) of 33.0 to 33.9 in adult   . Myelopathy (HCC) 08/01/2017  . Thoracic spinal stenosis 07/31/2017    Kary KosDilday, Orson Rho Suzanne, PT 03/13/2018, 8:59 PM  [facility] [address] [place], [place], [address] Phone: [phone]   Fax:  [phone]  Name: Scott Lane MRN: [id number] Date of Birth: 15-Jul-1985

## 2018-03-17 ENCOUNTER — Ambulatory Visit: Payer: 59 | Admitting: Physical Therapy

## 2018-03-17 DIAGNOSIS — R2689 Other abnormalities of gait and mobility: Secondary | ICD-10-CM | POA: Diagnosis not present

## 2018-03-17 DIAGNOSIS — M6281 Muscle weakness (generalized): Secondary | ICD-10-CM

## 2018-03-17 DIAGNOSIS — R2681 Unsteadiness on feet: Secondary | ICD-10-CM | POA: Diagnosis not present

## 2018-03-17 DIAGNOSIS — R208 Other disturbances of skin sensation: Secondary | ICD-10-CM | POA: Diagnosis not present

## 2018-03-17 DIAGNOSIS — G8222 Paraplegia, incomplete: Secondary | ICD-10-CM | POA: Diagnosis not present

## 2018-03-17 DIAGNOSIS — R29818 Other symptoms and signs involving the nervous system: Secondary | ICD-10-CM | POA: Diagnosis not present

## 2018-03-17 NOTE — Therapy (Signed)
[facility] [address] [place], [place], [address] Phone: [phone]   Fax:  [phone]  Physical Therapy Treatment  Patient Details  Name: Scott Lane MRN: [id number] Date of Birth: Apr 14, 1985 Referring Provider (PT): Marikay Alar, MD   Encounter Date: 03/17/2018  PT End of Session - 03/17/18 1215    Visit Number  25    Number of Visits  28    Date for PT Re-Evaluation  04/03/18    Authorization Type  [employer] Deductible.  VL: MN    PT Start Time  0805    PT Stop Time  0850    PT Time Calculation (min)  45 min       Past Medical History:  Diagnosis Date  . Chicken pox     Past Surgical History:  Procedure Laterality Date  . LUMBAR LAMINECTOMY/DECOMPRESSION MICRODISCECTOMY N/A 08/01/2017   Procedure: Thoracic nine, ten, eleven, twelve laminectomy;  Surgeon: Tia Alert, MD;  Location: [facility] OR;  Service: Neurosurgery;  Laterality: N/A;  . none    . THORACIC DISCECTOMY N/A 12/27/2017   Procedure: Laminectomy and Foraminotomy - Thoracic one - Thoracic three;  Surgeon: Tia Alert, MD;  Location: [facility] OR;  Service: Neurosurgery;  Laterality: N/A;  . WISDOM TOOTH EXTRACTION     bilateral lower.    There were no vitals filed for this visit.  Subjective Assessment - 03/17/18 0926    Subjective  Pt reports no changes; states he has been going to [facility] and using weight machines (leg press and hamstring curl) and also doing exercises in pool    Patient Stated Goals  Ambulation with no AD to get back to prior level of function.     Currently in Pain?  No/denies                       [facility] Adult PT Treatment/Exercise - 03/17/18 0819      Ambulation/Gait   Ambulation/Gait  Yes    Ambulation/Gait Assistance  6: Modified independent (Device/Increase time)    Ambulation/Gait Assistance Details  Pt did not use SPC during PT session - amb. from mat to steps, // bars and back to mat without  device    Ambulation Distance (Feet)  100 Feet    Assistive device  None    Gait Pattern  Left genu recurvatum   decreased Lt push off in stance   Ambulation Surface  Level;Indoor      Exercises   Exercises  Knee/Hip;Ankle      Knee/Hip Exercises: Stretches   Active Hamstring Stretch  Left;1 rep;30 seconds   Runner's stretch at counter     Knee/Hip Exercises: Standing   Heel Raises  Both;1 set;10 reps   with knees extended, and then 1 set w/knees flexed   Hip Flexion  Stengthening;Right;1 set;10 reps   with blue theraband   Hip Abduction  Stengthening;Right;1 set;10 reps   blue theraband   Hip Extension  Stengthening;Right;1 set;10 reps;Knee straight   blue theraband   Forward Step Up  Left;1 set;10 reps;Hand Hold: 2;Step Height: 6"    Step Down  Left;1 set;10 reps;Hand Hold: 2;Step Height: 6"    Functional Squat  1 set;10 reps   on Bosu   Other Standing Knee Exercises  Lt hip and knee flexion in standing with blue theraband      Ankle Exercises: Stretches   Gastroc Stretch  1 rep;30 seconds   with ProStretch  Pt performed closed chain plantarflexion LLE with Rt foot on balance bubble for increased weight-bearing on LLE 10 reps  With RUE support  Pt performed amb. On tip toes with knees flexed - forward/backward 2 reps and sideways 2 reps inside // bars with UE support  Pt performed standing on Bosu inside // bars for UE support prn - weight shifting anteriorly/posteriorly and laterally 10 reps each direction Squats x 10 reps on Bosu with bil. UE support on // bars   Lt knee extension control exercise - RLE in mid-stance, forward stance and backward stance - 10 reps of closed chain knee extension with blue  theraband on LLE          PT Short Term Goals - 02/17/18 2257      PT SHORT TERM GOAL #1   Title  = LTG        PT Long Term Goals - 02/17/18 1330      PT LONG TERM GOAL #1   Title  Pt will be independent with balance and LE strengthening HEP to  further improve functional independence     Time  6    Period  Weeks    Status  On-going    Target Date  04/03/18      PT LONG TERM GOAL #2   Title  Pt will improve standing balance and BERG > 52/56 to decrease falls risk and improve functional mobility    Baseline  11/11: 52/56     Time  6    Period  Weeks    Status  New    Target Date  04/03/18      PT LONG TERM GOAL #3   Title  Pt will improve gait velocity to >/= 2.62 ft/sec with LRAD indicating improvement in functional mobility     Baseline  11/11: 1.45 ft/sec with no AD; 1.77 ft/sec with SPC     Time  6    Period  Weeks    Status  New    Target Date  04/03/18      PT LONG TERM GOAL #4   Title  Pt will ambulate x 1000 over outdoor surfaces + curb (paved and grassy) MOD I with LRAD to improve functional mobility with community distances    Baseline  11/11: Pt ambulates community distances with SPC     Time  6    Period  Weeks    Status  On-going    Target Date  04/03/18      PT LONG TERM GOAL #5   Title  Pt will negotiate 12 steps with alternating stepping MOD I using single HR and SPC to improve functional mobility     Baseline  11/11; 12 steps alternating pattern with B HR's and therapist providing supervision    Time  6    Period  Weeks    Status  New    Target Date  04/03/18      PT LONG TERM GOAL #6   Title  Pt will demonstrate an FGA score of >/=19/30 to decrease fall risk potential with functional mobility using LRAD     Baseline  11/11: 11/30 indicating high fall risk potential with no AD    Time  6    Period  Weeks    Status  New    Target Date  04/03/18            Plan - 03/17/18 1216    Clinical Impression Statement  Pt's LLE  fatigues very quickly with closed chain strengthening exercises; LLE was tremoring due to fatigue and decreased muscle endurance.  Pt had difficulty ambulating on tip toes with Lt knee flexed due to weak quads, hamstrings and plantarflexors.                                                                                                                                                          Rehab Potential  Good    Clinical Impairments Affecting Rehab Potential  Pt currently reports pain and tightness to mid thoracic region near surgical incision which is limiting his functional mobility     PT Frequency  3x / week    PT Duration  6 weeks    PT Treatment/Interventions  ADLs/Self Care Home Management;Cryotherapy;Electrical Stimulation;Moist Heat;DME Instruction;Gait Network engineertraining;Stair training;Therapeutic activities;Functional mobility training;Therapeutic exercise;Balance training;Neuromuscular re-education;Patient/family education;Orthotic Fit/Training;Passive range of motion;Taping;Aquatic Therapy;Dry needling;Manual techniques    PT Next Visit Plan  continues to need significant L hamstring (open and closed chain) strengthening and training; dynamic standing balance and gait training without cane-introduce obstacles, weaving/changes in direction - agility on floor ladder, postural training, plyometric training (trampoline)    PT Home Exercise Plan  Reviewed Ai Chi postures given to pt on 02-21-18 - for pt to perform at [facility] in pool    Consulted and Agree with Plan of Care  Patient       Patient will benefit from skilled therapeutic intervention in order to improve the following deficits and impairments:  Abnormal gait, Decreased balance, Decreased coordination, Decreased strength, Difficulty walking, Increased muscle spasms, Impaired sensation, Pain, Impaired tone, Decreased range of motion  Visit Diagnosis: Other abnormalities of gait and mobility  Muscle weakness (generalized)     Problem List Patient Active Problem List   Diagnosis Date Noted  . S/P lumbar laminectomy 12/27/2017  . Paraplegia (HCC) 11/07/2017  . Quadriplegia, unspecified (HCC) 11/07/2017  . Leukocytosis   . Neuropathic pain   . Drug induced constipation   . Urinary retention   . Class 1  obesity due to excess calories with serious comorbidity and body mass index (BMI) of 33.0 to 33.9 in adult   . Myelopathy (HCC) 08/01/2017  . Thoracic spinal stenosis 07/31/2017    Kary KosDilday, Macrina Lehnert Suzanne, PT 03/17/2018, 12:23 PM  [facility] [address] [place], [place], [address] Phone: [phone]   Fax:  [phone]  Name: Scott Lane MRN: [id number] Date of Birth: [birth year]

## 2018-03-18 ENCOUNTER — Telehealth: Payer: Self-pay | Admitting: Physical Therapy

## 2018-03-18 NOTE — Telephone Encounter (Signed)
Hello Dr. Terrace ArabiaYan, Thank you for your reply regarding Alijah's injections.  I also wanted to see if you had any concerns with physical therapy performing trigger point dry needling in the muscles you recently injected.  I am not aware of any research documenting the effects of dry needling when combined with botox and I don't want to counteract his recent injections.  Any guidance you can provide will be greatly appreciated.  Thank you, Dierdre HighmanAudra F Potter, PT, DPT 03/18/18    10:40 AM

## 2018-03-18 NOTE — Telephone Encounter (Addendum)
Scott LopeAudra,  In theory, you should be able to perform dry needling in the muscles that were injected with BOTOX.  They have different mechanism of action.  But if we do both things at the same time, it would be hard to discern if BOTOX Injection has helped or cause trouble.  We are trying to make decision if it is worth for him to continue with BOTOX injection.   Maybe we should wait untill the end of this injection cycle to Feb 2020, if BOTOX A large dose injection did not help him much, we should not continue it.    Scott FeinsteinYijun Hara Lane, M.D. Ph.D.  [facility] [address] [place], [place] [address] Phone: [phone] Fax:      [phone]

## 2018-03-19 ENCOUNTER — Ambulatory Visit: Payer: 59 | Admitting: Physical Therapy

## 2018-03-19 DIAGNOSIS — R2681 Unsteadiness on feet: Secondary | ICD-10-CM | POA: Diagnosis not present

## 2018-03-19 DIAGNOSIS — G8222 Paraplegia, incomplete: Secondary | ICD-10-CM | POA: Diagnosis not present

## 2018-03-19 DIAGNOSIS — R29818 Other symptoms and signs involving the nervous system: Secondary | ICD-10-CM | POA: Diagnosis not present

## 2018-03-19 DIAGNOSIS — R208 Other disturbances of skin sensation: Secondary | ICD-10-CM | POA: Diagnosis not present

## 2018-03-19 DIAGNOSIS — R2689 Other abnormalities of gait and mobility: Secondary | ICD-10-CM | POA: Diagnosis not present

## 2018-03-19 DIAGNOSIS — M6281 Muscle weakness (generalized): Secondary | ICD-10-CM | POA: Diagnosis not present

## 2018-03-20 NOTE — Therapy (Signed)
[facility] [address] [place], [place], [address] Phone: [phone]   Fax:  [phone]  Physical Therapy Treatment  Patient Details  Name: Scott DesanctisLee M Manganello MRN: [id number] Date of Birth: [birth year] Referring Provider (PT): Marikay Alaravid Jones, MD   Encounter Date: 03/19/2018  PT End of Session - 03/20/18 2022    Visit Number  26    Number of Visits  28    Date for PT Re-Evaluation  04/03/18    Authorization Type  [place] Family Deductible.  VL: MN    PT Start Time  1500    PT Stop Time  1545    PT Time Calculation (min)  45 min       Past Medical History:  Diagnosis Date  . Chicken pox     Past Surgical History:  Procedure Laterality Date  . LUMBAR LAMINECTOMY/DECOMPRESSION MICRODISCECTOMY N/A 08/01/2017   Procedure: Thoracic nine, ten, eleven, twelve laminectomy;  Surgeon: Tia AlertJones, David S, MD;  [facility];  Service: Neurosurgery;  Laterality: N/A;  . none    . THORACIC DISCECTOMY N/A 12/27/2017   Procedure: Laminectomy and Foraminotomy - Thoracic one - Thoracic three;  Surgeon: Tia AlertJones, David S, MD;  [facility];  Service: Neurosurgery;  Laterality: N/A;  . WISDOM TOOTH EXTRACTION     bilateral lower.    There were no vitals filed for this visit.  Subjective Assessment - 03/20/18 2021    Subjective  Pt states he has been walking without his cane for the past 2 days - "doing alot better"    Pertinent History  Patient enjoys maintaining a very active life style     Patient Stated Goals  Ambulation with no AD to get back to prior level of function.     Currently in Pain?  No/denies           Aquatic therapy;  Pool temp 87.0degrees  Patient seen for aquatic therapy today.  Treatment took place in water 3.5-4 feet deep depending upon activity.  Pt entered and exited   the pool via step negotiation modified independently using hand rails.    LLE stretches - runner's stretch x 30 sec hold and  gastroc stretch with bil. forefeet on pool wall - 30 sec hold x 1 rep; Lt hamstring stretch with foot on wall With contract/relax for increased stretch  Sidestepping 100' across pool with squats for LE strengthening  Pt performed LLE strengthening with use of ankle cuff flotation weight - hip flexion, abduction and extension 10 reps each;  Lt hip flexion/extension with knee flexed at 90 degrees with UE support on noodle for assistance with balance; hip abduction in standing with knee flexed at 90 degrees With use of cuff weight; Lt knee flexion/extension 10 reps each leg - 2 sets with 1st rep with max AROM and 2nd rep performed quickly for improved coordination   Heel raises x 10 reps bil. LE's; LLE only 10 reps  Step ups onto 1st step in the pool - with LLE for quad strengthening: 7 reps with Bil. UE support; 3 reps with RUE suppport only  Pt performed marching in place  - slowly - for improved SLS on each leg 10 reps; 10 reps quickly to use viscosity of water for resistance and challenge with standing balance; progressed to marching forward/backward full length of pool (100') x 1 rep each Ai Chi postures - "balancing" and "gathering"  -- with each leg 10 reps each for improved SLS and  improved core stabilization  Jumping jacks 10 reps; cross country skiing 10 reps Jogging across pool 100' x 1 rep with cues to lift legs big for increased resistance using current and laminar flow  Pt requires buoyancy of water for support and to prevent fall with high level balance and ballistic movements which are unable to be safely performed on land                               PT Short Term Goals - 02/17/18 2257      PT SHORT TERM GOAL #1   Title  = LTG        PT Long Term Goals - 02/17/18 1330      PT LONG TERM GOAL #1   Title  Pt will be independent with balance and LE strengthening HEP to further improve functional independence     Time  6    Period  Weeks     Status  On-going    Target Date  04/03/18      PT LONG TERM GOAL #2   Title  Pt will improve standing balance and BERG > 52/56 to decrease falls risk and improve functional mobility    Baseline  11/11: 52/56     Time  6    Period  Weeks    Status  New    Target Date  04/03/18      PT LONG TERM GOAL #3   Title  Pt will improve gait velocity to >/= 2.62 ft/sec with LRAD indicating improvement in functional mobility     Baseline  11/11: 1.45 ft/sec with no AD; 1.77 ft/sec with SPC     Time  6    Period  Weeks    Status  New    Target Date  04/03/18      PT LONG TERM GOAL #4   Title  Pt will ambulate x 1000 over outdoor surfaces + curb (paved and grassy) MOD I with LRAD to improve functional mobility with community distances    Baseline  11/11: Pt ambulates community distances with SPC     Time  6    Period  Weeks    Status  On-going    Target Date  04/03/18      PT LONG TERM GOAL #5   Title  Pt will negotiate 12 steps with alternating stepping MOD I using single HR and SPC to improve functional mobility     Baseline  11/11; 12 steps alternating pattern with B HR's and therapist providing supervision    Time  6    Period  Weeks    Status  New    Target Date  04/03/18      PT LONG TERM GOAL #6   Title  Pt will demonstrate an FGA score of >/=19/30 to decrease fall risk potential with functional mobility using LRAD     Baseline  11/11: 11/30 indicating high fall risk potential with no AD    Time  6    Period  Weeks    Status  New    Target Date  04/03/18            Plan - 03/20/18 2022    Clinical Impression Statement  Aquatic therapy session focused on LLE strengthening exercises and Lt SLS activities; LLE continues to fatigue with closed chain ballistic activities but not as quickly as that noted in previous sessions.  Pt is improving with balance and with speed of LLE ROM, i.e. knee flexion/extension with decreased spasticity in quad noted.      Rehab Potential  Good     Clinical Impairments Affecting Rehab Potential  Pt currently reports pain and tightness to mid thoracic region near surgical incision which is limiting his functional mobility     PT Frequency  3x / week    PT Duration  6 weeks    PT Treatment/Interventions  ADLs/Self Care Home Management;Cryotherapy;Electrical Stimulation;Moist Heat;DME Instruction;Gait Network engineer;Therapeutic activities;Functional mobility training;Therapeutic exercise;Balance training;Neuromuscular re-education;Patient/family education;Orthotic Fit/Training;Passive range of motion;Taping;Aquatic Therapy;Dry needling;Manual techniques    PT Next Visit Plan  continues to need significant L hamstring (open and closed chain) strengthening and training; dynamic standing balance and gait training without cane-introduce obstacles, weaving/changes in direction - agility on floor ladder, postural training, plyometric training (trampoline)    PT Home Exercise Plan  Reviewed Ai Chi postures given to pt on 02-21-18 - for pt to perform at [facility] in pool    Consulted and Agree with Plan of Care  Patient       Patient will benefit from skilled therapeutic intervention in order to improve the following deficits and impairments:  Abnormal gait, Decreased balance, Decreased coordination, Decreased strength, Difficulty walking, Increased muscle spasms, Impaired sensation, Pain, Impaired tone, Decreased range of motion  Visit Diagnosis: Other abnormalities of gait and mobility  Muscle weakness (generalized)  Unsteadiness     Problem List Patient Active Problem List   Diagnosis Date Noted  . S/P lumbar laminectomy 12/27/2017  . Paraplegia (HCC) 11/07/2017  . Quadriplegia, unspecified (HCC) 11/07/2017  . Leukocytosis   . Neuropathic pain   . Drug induced constipation   . Urinary retention   . Class 1 obesity due to excess calories with serious comorbidity and body mass index (BMI) of 33.0 to 33.9 in adult   . Myelopathy  (HCC) 08/01/2017  . Thoracic spinal stenosis 07/31/2017    Kary Kos, PT 03/20/2018, 8:28 PM  [facility] [address] [place], [place], [address] Phone: [phone]   Fax:  [phone]  Name: HEYDEN JABER MRN: [id number] Date of Birth: 11/23/1985

## 2018-03-24 ENCOUNTER — Ambulatory Visit: Payer: 59 | Admitting: Physical Therapy

## 2018-03-24 DIAGNOSIS — M6281 Muscle weakness (generalized): Secondary | ICD-10-CM

## 2018-03-24 DIAGNOSIS — R2681 Unsteadiness on feet: Secondary | ICD-10-CM | POA: Diagnosis not present

## 2018-03-24 DIAGNOSIS — R2689 Other abnormalities of gait and mobility: Secondary | ICD-10-CM | POA: Diagnosis not present

## 2018-03-24 DIAGNOSIS — R208 Other disturbances of skin sensation: Secondary | ICD-10-CM | POA: Diagnosis not present

## 2018-03-24 DIAGNOSIS — G8222 Paraplegia, incomplete: Secondary | ICD-10-CM | POA: Diagnosis not present

## 2018-03-24 DIAGNOSIS — R29818 Other symptoms and signs involving the nervous system: Secondary | ICD-10-CM | POA: Diagnosis not present

## 2018-03-24 NOTE — Therapy (Signed)
[facility] [address] [place], [place], [address] Phone: [phone]   Fax:  [phone]  Physical Therapy Treatment  Patient Details  Name: Scott Lane MRN: [id number] Date of Birth: November 14, 1985 Referring Provider (PT): Marikay Alar, MD   Encounter Date: 03/24/2018  PT End of Session - 03/24/18 1312    Visit Number  27    Number of Visits  28    Date for PT Re-Evaluation  04/03/18    Authorization Type  [place] [employer] Deductible.  VL: MN    PT Start Time  0802    PT Stop Time  0845    PT Time Calculation (min)  43 min    Equipment Utilized During Treatment  Gait belt       Past Medical History:  Diagnosis Date  . Chicken pox     Past Surgical History:  Procedure Laterality Date  . LUMBAR LAMINECTOMY/DECOMPRESSION MICRODISCECTOMY N/A 08/01/2017   Procedure: Thoracic nine, ten, eleven, twelve laminectomy;  Surgeon: Tia Alert, MD;  Location: [facility] OR;  Service: Neurosurgery;  Laterality: N/A;  . none    . THORACIC DISCECTOMY N/A 12/27/2017   Procedure: Laminectomy and Foraminotomy - Thoracic one - Thoracic three;  Surgeon: Tia Alert, MD;  Location: [facility] OR;  Service: Neurosurgery;  Laterality: N/A;  . WISDOM TOOTH EXTRACTION     bilateral lower.    There were no vitals filed for this visit.  Subjective Assessment - 03/24/18 0803    Subjective  Pt states he is walking around office and around his home without his cane     Pertinent History  Patient enjoys maintaining a very active life style     Patient Stated Goals  Ambulation with no AD to get back to prior level of function.     Currently in Pain?  No/denies                       [facility] Adult PT Treatment/Exercise - 03/24/18 0821      Transfers   Transfers  Sit to Stand    Sit to Stand  5: Supervision    Number of Reps  10 reps    Comments  Rt foot on balance bubble - no UE support used      Ambulation/Gait   Ambulation/Gait  Yes    Ambulation/Gait Assistance  6: Modified independent (Device/Increase time)    Ambulation/Gait Assistance Details  pt used SPC when he became fatigued     Ambulation Distance (Feet)  50 Feet    Assistive device  None    Gait Pattern  Left genu recurvatum   decreased Lt push off in stance   Ambulation Surface  Level;Indoor      Knee/Hip Exercises: Stretches   Active Hamstring Stretch  Left;2 reps;30 seconds   in seated position and in standing - Runner's stretch   Gastroc Stretch  Left;1 rep;Right;30 seconds   using ProStretch     Knee/Hip Exercises: Standing   Heel Raises  Both;1 set;10 reps   with knees extended, and then 1 set w/knees flexed   Forward Step Up  Left;1 set;10 reps;Hand Hold: 2;Step Height: 6"    Functional Squat  1 set;10 reps   on Bosu     Knee/Hip Exercises: Seated   Hamstring Curl  Strengthening;Left;1 set;10 reps   blue theraband used       NeuroRe-ed:  Pt performed forwards and backwards ambulation inside // bars  on tip toes for gastroc strengthening 2 reps each with UE support Sideways amb. On tip toes 10' x 4 reps with UE support with knees flexed for hamstring and gastroc strengthening   Pt performed sidestepping with squats 10' x 4 reps with blue theraband with bil. UE support on // bar; pt had more difficulty amb. Toward left side  Due to LLE weakness  Pt performed stepping with RLE with blue band around both ankles - forward, side and back for improved LLE SLS and for LLE strengthening   Pt performed stepping up/back on incline with RLE for LLE strengthening  - 10 reps; stepping down/back on decline with RLE 10 reps for LLE closed Chain strengthening      PT Short Term Goals - 02/17/18 2257      PT SHORT TERM GOAL #1   Title  = LTG        PT Long Term Goals - 02/17/18 1330      PT LONG TERM GOAL #1   Title  Pt will be independent with balance and LE strengthening HEP to further improve functional independence      Time  6    Period  Weeks    Status  On-going    Target Date  04/03/18      PT LONG TERM GOAL #2   Title  Pt will improve standing balance and BERG > 52/56 to decrease falls risk and improve functional mobility    Baseline  11/11: 52/56     Time  6    Period  Weeks    Status  New    Target Date  04/03/18      PT LONG TERM GOAL #3   Title  Pt will improve gait velocity to >/= 2.62 ft/sec with LRAD indicating improvement in functional mobility     Baseline  11/11: 1.45 ft/sec with no AD; 1.77 ft/sec with SPC     Time  6    Period  Weeks    Status  New    Target Date  04/03/18      PT LONG TERM GOAL #4   Title  Pt will ambulate x 1000 over outdoor surfaces + curb (paved and grassy) MOD I with LRAD to improve functional mobility with community distances    Baseline  11/11: Pt ambulates community distances with SPC     Time  6    Period  Weeks    Status  On-going    Target Date  04/03/18      PT LONG TERM GOAL #5   Title  Pt will negotiate 12 steps with alternating stepping MOD I using single HR and SPC to improve functional mobility     Baseline  11/11; 12 steps alternating pattern with B HR's and therapist providing supervision    Time  6    Period  Weeks    Status  New    Target Date  04/03/18      PT LONG TERM GOAL #6   Title  Pt will demonstrate an FGA score of >/=19/30 to decrease fall risk potential with functional mobility using LRAD     Baseline  11/11: 11/30 indicating high fall risk potential with no AD    Time  6    Period  Weeks    Status  New    Target Date  04/03/18            Plan - 03/24/18 1313    Clinical Impression  Statement  Pt demonstrates Lt quad weakness with pt unable to step up onto 6" step without bil. UE support;  pt needed min hand held assist and SPC to perform stepping on incline/decline due to Lt quad weakness, with pt stating he felt as though left knee was going to buckle; pt has difficulty performing plantarflexion on LLE due to  gastroc weakness                                                                                                                                                   Rehab Potential  Good    Clinical Impairments Affecting Rehab Potential  Pt currently reports pain and tightness to mid thoracic region near surgical incision which is limiting his functional mobility     PT Frequency  3x / week    PT Duration  6 weeks    PT Treatment/Interventions  ADLs/Self Care Home Management;Cryotherapy;Electrical Stimulation;Moist Heat;DME Instruction;Gait Network engineertraining;Stair training;Therapeutic activities;Functional mobility training;Therapeutic exercise;Balance training;Neuromuscular re-education;Patient/family education;Orthotic Fit/Training;Passive range of motion;Taping;Aquatic Therapy;Dry needling;Manual techniques    PT Next Visit Plan  continues to need significant L hamstring (open and closed chain) strengthening and training; dynamic standing balance and gait training without cane-introduce obstacles, weaving/changes in direction - agility on floor ladder, postural training, plyometric training (trampoline)    PT Home Exercise Plan  Reviewed Ai Chi postures given to pt on 02-21-18 - for pt to perform at [facility] in pool    Consulted and Agree with Plan of Care  Patient       Patient will benefit from skilled therapeutic intervention in order to improve the following deficits and impairments:  Abnormal gait, Decreased balance, Decreased coordination, Decreased strength, Difficulty walking, Increased muscle spasms, Impaired sensation, Pain, Impaired tone, Decreased range of motion  Visit Diagnosis: Other abnormalities of gait and mobility  Muscle weakness (generalized)  Unsteadiness     Problem List Patient Active Problem List   Diagnosis Date Noted  . S/P lumbar laminectomy 12/27/2017  . Paraplegia (HCC) 11/07/2017  . Quadriplegia, unspecified (HCC) 11/07/2017  . Leukocytosis   . Neuropathic pain   .  Drug induced constipation   . Urinary retention   . Class 1 obesity due to excess calories with serious comorbidity and body mass index (BMI) of 33.0 to 33.9 in adult   . Myelopathy (HCC) 08/01/2017  . Thoracic spinal stenosis 07/31/2017    AVWUJW, JXBJY NWGNFAOilday, Javonni Macke Suzanne, PT 03/24/2018, 1:21 PM  [facility] [address] [place], [place], [address] Phone: [phone]   Fax:  [phone]  Name: Orene DesanctisLee M Renfrew MRN: [id number] Date of Birth: 1985-05-28

## 2018-03-26 ENCOUNTER — Ambulatory Visit: Payer: 59 | Admitting: Physical Therapy

## 2018-03-26 DIAGNOSIS — M6281 Muscle weakness (generalized): Secondary | ICD-10-CM | POA: Diagnosis not present

## 2018-03-26 DIAGNOSIS — R2681 Unsteadiness on feet: Secondary | ICD-10-CM

## 2018-03-26 DIAGNOSIS — R2689 Other abnormalities of gait and mobility: Secondary | ICD-10-CM | POA: Diagnosis not present

## 2018-03-26 DIAGNOSIS — G8222 Paraplegia, incomplete: Secondary | ICD-10-CM | POA: Diagnosis not present

## 2018-03-26 DIAGNOSIS — R29818 Other symptoms and signs involving the nervous system: Secondary | ICD-10-CM | POA: Diagnosis not present

## 2018-03-26 DIAGNOSIS — R208 Other disturbances of skin sensation: Secondary | ICD-10-CM | POA: Diagnosis not present

## 2018-03-27 NOTE — Therapy (Signed)
[facility] [address] [place], [place], [address] Phone: [phone]   Fax:  [phone]  Physical Therapy Treatment  Patient Details  Name: Scott Lane MRN: [id number] Date of Birth: 09-01-1985 Referring Provider (PT): Marikay Alar, MD   Encounter Date: 03/26/2018  PT End of Session - 03/27/18 2047    Visit Number  28    Number of Visits  30    Date for PT Re-Evaluation  04/03/18    Authorization Type  [place] Family Deductible.  VL: MN    PT Start Time  1500    PT Stop Time  1545    PT Time Calculation (min)  45 min       Past Medical History:  Diagnosis Date  . Chicken pox     Past Surgical History:  Procedure Laterality Date  . LUMBAR LAMINECTOMY/DECOMPRESSION MICRODISCECTOMY N/A 08/01/2017   Procedure: Thoracic nine, ten, eleven, twelve laminectomy;  Surgeon: Tia Alert, MD;  Location: [facility] OR;  Service: Neurosurgery;  Laterality: N/A;  . none    . THORACIC DISCECTOMY N/A 12/27/2017   Procedure: Laminectomy and Foraminotomy - Thoracic one - Thoracic three;  Surgeon: Tia Alert, MD;  Location: [facility] OR;  Service: Neurosurgery;  Laterality: N/A;  . WISDOM TOOTH EXTRACTION     bilateral lower.    There were no vitals filed for this visit.  Subjective Assessment - 03/27/18 2045    Subjective  Pt presents to [facility] for aquatic therapy session; pt amb. from bleachers to steps for pool entry without use of [facility] for first time during aquatic therapy    Patient Stated Goals  Ambulation with no AD to get back to prior level of function.     Currently in Pain?  No/denies              Aquatic therapy;  Pool temp 87.4 degrees Patient seen for aquatic therapy today.  Treatment took place in water 3.5-4 feet deep depending upon activity.  Pt entered and exited   the pool via step negotiation modified independently using hand rails.    LLE stretches - runner's stretch x 30 sec hold and gastroc  stretch with bil. forefeet on pool wall - 30 sec hold x 1 rep; Lt hamstring stretch with foot on wall With contract/relax for increased stretch   Pt performed LLE strengthening with use of ankle cuff flotation weight - hip flexion, abduction and extension 10 reps each;  Lt hip flexion/extension with knee flexed at 90 degrees with UE support on noodle for assistance with balance; hip abduction in standing with knee flexed at 90 degrees With use of cuff weight; 2nd set of kicks (forwards, backwards and side kicks) performed without UE support to work on standing balance  Pt performed marching in place  - slowly - for improved SLS on each leg 10 reps; 10 reps quickly to use viscosity of water for resistance and challenge with standing balance; progressed to marching forward/backward full length of pool (100') x 1 rep each  Braiding 50' x 1 rep for improved coordination and flexibility of LLE Jumping jacks 10 reps; cross country skiing 10 reps Jogging across pool 100' x 2 reps with cues to lift legs big for increased resistance using current and laminar flow  Pt performed Ai Chi postures - Balancing and Accepting with Delorise Shiner - 10 reps each to improve balance and weight shift onto opposite LE  Pt requires buoyancy of water for support and  to prevent fall with high level balance and ballistic movements which are unable to be safely performed on land                                      PT Short Term Goals - 02/17/18 2257      PT SHORT TERM GOAL #1   Title  = LTG        PT Long Term Goals - 02/17/18 1330      PT LONG TERM GOAL #1   Title  Pt will be independent with balance and LE strengthening HEP to further improve functional independence     Time  6    Period  Weeks    Status  On-going    Target Date  04/03/18      PT LONG TERM GOAL #2   Title  Pt will improve standing balance and BERG > 52/56 to decrease falls risk and improve functional mobility     Baseline  11/11: 52/56     Time  6    Period  Weeks    Status  New    Target Date  04/03/18      PT LONG TERM GOAL #3   Title  Pt will improve gait velocity to >/= 2.62 ft/sec with LRAD indicating improvement in functional mobility     Baseline  11/11: 1.45 ft/sec with no AD; 1.77 ft/sec with SPC     Time  6    Period  Weeks    Status  New    Target Date  04/03/18      PT LONG TERM GOAL #4   Title  Pt will ambulate x 1000 over outdoor surfaces + curb (paved and grassy) MOD I with LRAD to improve functional mobility with community distances    Baseline  11/11: Pt ambulates community distances with SPC     Time  6    Period  Weeks    Status  On-going    Target Date  04/03/18      PT LONG TERM GOAL #5   Title  Pt will negotiate 12 steps with alternating stepping MOD I using single HR and SPC to improve functional mobility     Baseline  11/11; 12 steps alternating pattern with B HR's and therapist providing supervision    Time  6    Period  Weeks    Status  New    Target Date  04/03/18      PT LONG TERM GOAL #6   Title  Pt will demonstrate an FGA score of >/=19/30 to decrease fall risk potential with functional mobility using LRAD     Baseline  11/11: 11/30 indicating high fall risk potential with no AD    Time  6    Period  Weeks    Status  New    Target Date  04/03/18            Plan - 03/27/18 2048    Clinical Impression Statement  Pt progressing well with increasing LLE strength, balance and gait as pt amb. from bleachers to steps for pool entry [facility] for [facility] during aquatic therapy.  Pt continues to have some mild difficulty with balance with activities (some Ai Chi postures requiring SLS) with weight shifting but improvement noted with practice and repetition.  Pt progressing well towards goals.  Rehab Potential  Good    PT Frequency  3x / week    PT Duration  6 weeks    PT Treatment/Interventions  ADLs/Self Care Home Management;Cryotherapy;Electrical Stimulation;Moist Heat;DME Instruction;Gait Network engineertraining;Stair training;Therapeutic activities;Functional mobility training;Therapeutic exercise;Balance training;Neuromuscular re-education;Patient/family education;Orthotic Fit/Training;Passive range of motion;Taping;Aquatic Therapy;Dry needling;Manual techniques    PT Next Visit Plan  begin checking LTG's - plan D/C next week     PT Home Exercise Plan  Reviewed Ai Chi postures given to pt on 02-21-18 - for [facility] to [facility]    Consulted and Agree with Plan of Care  Patient       Patient will benefit from skilled therapeutic intervention in order to improve the following deficits and impairments:  Abnormal gait, Decreased balance, Decreased coordination, Decreased strength, Difficulty walking, Increased muscle spasms, Impaired sensation, Pain, Impaired tone, Decreased range of motion  Visit Diagnosis: Other abnormalities of gait and mobility  Muscle weakness (generalized)  Unsteadiness     Problem List Patient Active Problem List   Diagnosis Date Noted  . S/P lumbar laminectomy 12/27/2017  . Paraplegia (HCC) 11/07/2017  . Quadriplegia, unspecified (HCC) 11/07/2017  . Leukocytosis   . Neuropathic pain   . Drug induced constipation   . Urinary retention   . Class 1 obesity due to excess calories with serious comorbidity and body mass index (BMI) of 33.0 to 33.9 in adult   . Myelopathy (HCC) 08/01/2017  . Thoracic spinal stenosis 07/31/2017    Kary KosDilday, Karey Suthers Suzanne, PT 03/27/2018, 8:55 PM  [facility] [address] [place], [place], [address] Phone: [phone]   Fax:  [phone]  Name: Scott Lane MRN: [id number] Date of Birth: [birth year]

## 2018-03-31 ENCOUNTER — Ambulatory Visit: Payer: 59 | Admitting: Physical Therapy

## 2018-03-31 DIAGNOSIS — M6281 Muscle weakness (generalized): Secondary | ICD-10-CM | POA: Diagnosis not present

## 2018-03-31 DIAGNOSIS — R2681 Unsteadiness on feet: Secondary | ICD-10-CM

## 2018-03-31 DIAGNOSIS — R208 Other disturbances of skin sensation: Secondary | ICD-10-CM | POA: Diagnosis not present

## 2018-03-31 DIAGNOSIS — R2689 Other abnormalities of gait and mobility: Secondary | ICD-10-CM | POA: Diagnosis not present

## 2018-03-31 DIAGNOSIS — G8222 Paraplegia, incomplete: Secondary | ICD-10-CM | POA: Diagnosis not present

## 2018-03-31 DIAGNOSIS — R29818 Other symptoms and signs involving the nervous system: Secondary | ICD-10-CM | POA: Diagnosis not present

## 2018-03-31 NOTE — Therapy (Signed)
[facility] [address] [place], [place], [address] Phone: [phone]   Fax:  [phone]  Physical Therapy Treatment  Patient Details  Name: Scott Lane MRN: [id number] Date of Birth: Jun 07, 1985 Referring Provider (PT): Sherley Bounds, MD   Encounter Date: 03/31/2018  PT End of Session - 03/31/18 0919    Visit Number  29    Number of Visits  30    Date for PT Re-Evaluation  04/03/18    PT Start Time  0803    PT Stop Time  7106    PT Time Calculation (min)  44 min       Past Medical History:  Diagnosis Date  . Chicken pox     Past Surgical History:  Procedure Laterality Date  . LUMBAR LAMINECTOMY/DECOMPRESSION MICRODISCECTOMY N/A 08/01/2017   Procedure: Thoracic nine, ten, eleven, twelve laminectomy;  Surgeon: Eustace Moore, MD;  Location: [facility];  Service: Neurosurgery;  Laterality: N/A;  . none    . THORACIC DISCECTOMY N/A 12/27/2017   Procedure: Laminectomy and Foraminotomy - Thoracic one - Thoracic three;  Surgeon: Eustace Moore, MD;  Location: [facility];  Service: Neurosurgery;  Laterality: N/A;  . WISDOM TOOTH EXTRACTION     bilateral lower.    There were no vitals filed for this visit.  Subjective Assessment - 03/31/18 0859    Subjective  Pt states he is doing well - plans on going to [facility] to the pool after therapy today    Patient Stated Goals  Ambulation with no AD to get back to prior level of function.     Currently in Pain?  No/denies         [facility] PT Assessment - 03/31/18 0817      Berg Balance Test   Sit to Stand  Able to stand without using hands and stabilize independently    Standing Unsupported  Able to stand safely 2 minutes    Sitting with Back Unsupported but Feet Supported on Floor or Stool  Able to sit safely and securely 2 minutes    Stand to Sit  Sits safely with minimal use of hands    Transfers  Able to transfer safely, minor use of hands    Standing Unsupported with Eyes  Closed  Able to stand 10 seconds safely    Standing Ubsupported with Feet Together  Able to place feet together independently and stand 1 minute safely    From Standing, Reach Forward with Outstretched Arm  Can reach confidently >25 cm (10")    From Standing Position, Pick up Object from Floor  Able to pick up shoe safely and easily    From Standing Position, Turn to Look Behind Over each Shoulder  Looks behind from both sides and weight shifts well    Turn 360 Degrees  Able to turn 360 degrees safely but slowly   5.57 to R; 5.78 to L   Standing Unsupported, Alternately Place Feet on Step/Stool  Able to stand independently and safely and complete 8 steps in 20 seconds    Standing Unsupported, One Foot in Front  Able to place foot tandem independently and hold 30 seconds    Standing on One Leg  Able to lift leg independently and hold > 10 seconds    Total Score  54                   [facility] Adult PT Treatment/Exercise - 03/31/18 2694  Ambulation/Gait   Ambulation/Gait  Yes    Ambulation/Gait Assistance  5: Supervision    Ambulation Distance (Feet)  100 Feet    Assistive device  None;Straight cane    Ambulation Surface  Level;Indoor    Gait velocity  13.65 secs with SPC = 2.40 ft/sec:  15.68 secs without SPC = 2.09 ft/sec      Knee/Hip Exercises: Stretches   Active Hamstring Stretch  Left;1 rep;30 seconds    Gastroc Stretch  Left;1 rep;Right;30 seconds   using ProStretch   Other Knee/Hip Stretches  Runner's stretch on step - LLE 30 sec hold x 1 rep      Knee/Hip Exercises: Standing   Heel Raises  Both;1 set;10 reps;Left   unilateral LLE 10 reps in standing   Functional Squat  1 set;10 reps   LLE only   Other Standing Knee Exercises  pt performed standing hip flexion, knee extension/flexion with hip extension - 5 reps bil. UE support, then 5 reps with 1 UE support on each leg       Lt step up exercise - performed inside // bars with 4" [employer] stp with bil. UE support x  10 reps; Lt step down exercise  For left quad eccentric strengthening 10 reps with UE support  Pt amb. Forwards and backwards - 2 reps - inside // bars with UE support prn for LLE strengthening and for improved balance Pt performed sidestepping on tiptoes inside // bars 2 reps with UE support prn  Discussed aquatic exercise program - pt is continuing with aquatic exercise at [place] - 02/17/18 2257      PT SHORT TERM GOAL #1   Title  = LTG        PT Long Term Goals - 03/31/18 0817      PT LONG TERM GOAL #1   Title  Pt will be independent with balance and LE strengthening HEP to further improve functional independence     Baseline  met 03-31-18    Status  Achieved      PT LONG TERM GOAL #2   Title  Pt will improve standing balance and BERG > 52/56 to decrease falls risk and improve functional mobility    Baseline  11/11: 52/56 ; 54/56 on 03-31-18    Status  Achieved      PT LONG TERM GOAL #3   Title  Pt will improve gait velocity to >/= 2.62 ft/sec with LRAD indicating improvement in functional mobility     Baseline  11/11: 1.45 ft/sec with no AD; 1.77 ft/sec with SPC ; 13.65 with SPC  = 2.40 ft/sec ;  15.68 without SPC = 2.09 ft/sec    Status  Not Met      PT LONG TERM GOAL #4   Title  Pt will ambulate x 1000 over outdoor surfaces + curb (paved and grassy) MOD I with LRAD to improve functional mobility with community distances    Status  Achieved      PT LONG TERM GOAL #5   Title  Pt will negotiate 12 steps with alternating stepping MOD I using single HR and SPC to improve functional mobility     Status  Achieved      PT LONG TERM GOAL #6   Title  Pt will demonstrate an FGA score of >/=19/30 to decrease fall risk potential with functional mobility using LRAD  Plan - 03/31/18 0921    Clinical Impression Statement  Pt has met LTG's #1, 2, 4, and 5:  Berg score has increased from 52/56 on 02-17-18 to 54.56 as of  03-31-18; pt did not meet gait velocity goal as gait speed remains decreased, however, gait velocity has increased since previous assessment on 02-17-18;  spasticity in LLE impacts gait velocity and standing balance.      Rehab Potential  Good    Clinical Impairments Affecting Rehab Potential  Pt currently reports pain and tightness to mid thoracic region near surgical incision which is limiting his functional mobility     PT Frequency  3x / week    PT Duration  6 weeks    PT Treatment/Interventions  ADLs/Self Care Home Management;Cryotherapy;Electrical Stimulation;Moist Heat;DME Instruction;Gait Scientist, forensic;Therapeutic activities;Functional mobility training;Therapeutic exercise;Balance training;Neuromuscular re-education;Patient/family education;Orthotic Fit/Training;Passive range of motion;Taping;Aquatic Therapy;Dry needling;Manual techniques    PT Next Visit Plan  check LTG #6 (FGA); (only goal remaining TBA); plan D/C next session    PT Home Exercise Plan  Reviewed Ai Chi postures given to pt on 02-21-18 - for pt to perform at [facility] in pool    Consulted and Agree with Plan of Care  Patient       Patient will benefit from skilled therapeutic intervention in order to improve the following deficits and impairments:  Abnormal gait, Decreased balance, Decreased coordination, Decreased strength, Difficulty walking, Increased muscle spasms, Impaired sensation, Pain, Impaired tone, Decreased range of motion  Visit Diagnosis: Other abnormalities of gait and mobility  Muscle weakness (generalized)  Unsteadiness     Problem List Patient Active Problem List   Diagnosis Date Noted  . S/P lumbar laminectomy 12/27/2017  . Paraplegia ([facility]) 11/07/2017  . Quadriplegia, unspecified ([facility]) 11/07/2017  . Leukocytosis   . Neuropathic pain   . Drug induced constipation   . Urinary retention   . Class 1 obesity due to excess calories with serious comorbidity and body mass index (BMI) of 33.0  to 33.9 in adult   . Myelopathy ([place]) 08/01/2017  . Thoracic spinal stenosis 07/31/2017    Alda Lea, PT 03/31/2018, 11:40 AM  [facility] [address] [place], [place], [address] Phone: [phone]   Fax:  [phone]  Name: Scott Lane MRN: [id number] Date of Birth: 01-02-1986

## 2018-04-04 ENCOUNTER — Ambulatory Visit: Payer: 59 | Admitting: Rehabilitative and Restorative Service Providers"

## 2018-04-04 ENCOUNTER — Encounter: Payer: Self-pay | Admitting: Rehabilitative and Restorative Service Providers"

## 2018-04-04 DIAGNOSIS — R2689 Other abnormalities of gait and mobility: Secondary | ICD-10-CM

## 2018-04-04 DIAGNOSIS — R29818 Other symptoms and signs involving the nervous system: Secondary | ICD-10-CM

## 2018-04-04 DIAGNOSIS — R2681 Unsteadiness on feet: Secondary | ICD-10-CM | POA: Diagnosis not present

## 2018-04-04 DIAGNOSIS — M6281 Muscle weakness (generalized): Secondary | ICD-10-CM | POA: Diagnosis not present

## 2018-04-04 DIAGNOSIS — G8222 Paraplegia, incomplete: Secondary | ICD-10-CM | POA: Diagnosis not present

## 2018-04-04 DIAGNOSIS — R208 Other disturbances of skin sensation: Secondary | ICD-10-CM | POA: Diagnosis not present

## 2018-04-04 NOTE — Patient Instructions (Signed)
LEG MACHINES AT [facility]:  1) Leg press:  Can do one leg at a time.  *Watch knee hyperextension on the left side when you push off of the plate.  2) Leg extension:  Do one leg at a time.  For the last 10-15 degrees of left knee extension, you can help with the right leg and then slowly lower with the left leg.  3) Leg curls:  Do one leg at a time.    4) Hip abduction machine:  Gluteus medius strengthening *they do have a standing machine.   CARDIO: 1) Bike:  Increase resistance (level up to feel like you have to work hard), but still try to keep 60 revolutions/minute.  2) Elliptical:  Watch left knee hyperextension.  Can try to go forwards and backwards as long as the knee doesn't "pop" back.

## 2018-04-04 NOTE — Therapy (Signed)
[facility] [address] [place], [place], [address] Phone: [phone]   Fax:  [phone]  Physical Therapy Treatment and Updated Goals  Patient Details  Name: Scott Lane MRN: [id number] Date of Birth: 02-Mar-1986 Referring Provider (PT): Sherley Bounds, MD   Encounter Date: 04/04/2018  PT End of Session - 04/04/18 0929    Visit Number  30    Number of Visits  30    Date for PT Re-Evaluation  04/03/18    Authorization Type  [employer] Deductible.  VL: MN    PT Start Time  0830    PT Stop Time  0924    PT Time Calculation (min)  54 min    Activity Tolerance  Patient tolerated treatment well    Behavior During Therapy  WFL for tasks assessed/performed       Past Medical History:  Diagnosis Date  . Chicken pox     Past Surgical History:  Procedure Laterality Date  . LUMBAR LAMINECTOMY/DECOMPRESSION MICRODISCECTOMY N/A 08/01/2017   Procedure: Thoracic nine, ten, eleven, twelve laminectomy;  Surgeon: Eustace Moore, MD;  Location: [facility];  Service: Neurosurgery;  Laterality: N/A;  . none    . THORACIC DISCECTOMY N/A 12/27/2017   Procedure: Laminectomy and Foraminotomy - Thoracic one - Thoracic three;  Surgeon: Eustace Moore, MD;  Location: [facility];  Service: Neurosurgery;  Laterality: N/A;  . WISDOM TOOTH EXTRACTION     bilateral lower.    There were no vitals filed for this visit.  Subjective Assessment - 04/04/18 0837    Subjective  The patient states that he is ready to have a break from physical therapy.  He is working out 3 days/week at [employer].  He reports he is ready to go without the cane more.  He is currently walking short distances at home and at the office without the cane.     Pertinent History  Patient enjoys maintaining a very active life style     Patient Stated Goals  Ambulation with no AD to get back to prior level of function.     Currently in Pain?  No/denies         [facility] PT  Assessment - 04/04/18 0841      Strength   Overall Strength  Deficits    Strength Assessment Site  Hip;Knee;Ankle    Right/Left Hip  Right;Left    Right Hip Flexion  5/5    Right Hip Extension  4-/5    Right Hip ABduction  3+/5    Left Hip Flexion  3+/5    Left Hip Extension  3/5    Left Hip ABduction  2+/5    Right/Left Knee  Right;Left    Right Knee Flexion  4/5    Right Knee Extension  5/5    Left Knee Flexion  3-/5    Left Knee Extension  4+/5    Right/Left Ankle  Right;Left    Right Ankle Dorsiflexion  4+/5    Right Ankle Inversion  4+/5    Right Ankle Eversion  4+/5    Left Ankle Dorsiflexion  4-/5    Left Ankle Inversion  4-/5    Left Ankle Eversion  3+/5      Functional Gait  Assessment   Gait assessed   Yes    Gait Level Surface  Walks 20 ft, slow speed, abnormal gait pattern, evidence for imbalance or deviates 10-15 in outside of the 12 in walkway  width. Requires more than 7 sec to ambulate 20 ft.    Change in Gait Speed  Able to change speed, demonstrates mild gait deviations, deviates 6-10 in outside of the 12 in walkway width, or no gait deviations, unable to achieve a major change in velocity, or uses a change in velocity, or uses an assistive device.    Gait with Horizontal Head Turns  Performs head turns smoothly with slight change in gait velocity (eg, minor disruption to smooth gait path), deviates 6-10 in outside 12 in walkway width, or uses an assistive device.    Gait with Vertical Head Turns  Performs task with slight change in gait velocity (eg, minor disruption to smooth gait path), deviates 6 - 10 in outside 12 in walkway width or uses assistive device    Gait and Pivot Turn  Turns slowly, requires verbal cueing, or requires several small steps to catch balance following turn and stop    Step Over Obstacle  Is able to step over one shoe box (4.5 in total height) but must slow down and adjust steps to clear box safely. May require verbal cueing.    Gait with  Narrow Base of Support  Ambulates less than 4 steps heel to toe or cannot perform without assistance.    Gait with Eyes Closed  Walks 20 ft, slow speed, abnormal gait pattern, evidence for imbalance, deviates 10-15 in outside 12 in walkway width. Requires more than 9 sec to ambulate 20 ft.    Ambulating Backwards  Walks 20 ft, uses assistive device, slower speed, mild gait deviations, deviates 6-10 in outside 12 in walkway width.    Steps  Alternating feet, must use rail.    Total Score  14    FGA comment:  14/30        THERAPEUTIC EXERCISE: Single leg stance activities near countertop working on hip stability. heel raises during squats near countertop dec'ing UE support Stool pulls x 40 feet mini squats near support surface with left weight shifting         [facility] Adult PT Treatment/Exercise - 04/04/18 0930      Ambulation/Gait   Ambulation/Gait  Yes    Ambulation/Gait Assistance  6: Modified independent (Device/Increase time);5: Supervision    Ambulation/Gait Assistance Details  Patient ambulated with [facility] and without a device in clinic    Assistive device  Straight cane;None    Ambulation Surface  Level;Indoor      Self-Care   Self-Care  Other Self-Care Comments    Other Self-Care Comments   PT and patient discussed gym routine based on MMT.  We emphasized adding gluteus medius strengthening and unilateral squats to the plan.               PT Education - 04/04/18 0937    Education Details  gym routine    Person(s) Educated  Patient    Methods  Explanation;Demonstration    Comprehension  Verbalized understanding       PT Short Term Goals - 02/17/18 2257      PT SHORT TERM GOAL #1   Title  = LTG        PT Long Term Goals - 04/04/18 0840      PT LONG TERM GOAL #1   Title  Pt will be independent with balance and LE strengthening HEP to further improve functional independence     Baseline  met 03-31-18    Status  Achieved      PT LONG TERM  GOAL #2   Title   Pt will improve standing balance and BERG > 52/56 to decrease falls risk and improve functional mobility    Baseline  11/11: 52/56 ; 54/56 on 03-31-18    Status  Achieved      PT LONG TERM GOAL #3   Title  Pt will improve gait velocity to >/= 2.62 ft/sec with LRAD indicating improvement in functional mobility     Baseline  11/11: 1.45 ft/sec with no AD; 1.77 ft/sec with SPC ; 13.65 with SPC  = 2.40 ft/sec ;  15.68 without SPC = 2.09 ft/sec    Status  Not Met      PT LONG TERM GOAL #4   Title  Pt will ambulate x 1000 over outdoor surfaces + curb (paved and grassy) MOD I with LRAD to improve functional mobility with community distances    Status  Achieved      PT LONG TERM GOAL #5   Title  Pt will negotiate 12 steps with alternating stepping MOD I using single HR and SPC to improve functional mobility     Status  Achieved      PT LONG TERM GOAL #6   Title  Pt will demonstrate an FGA score of >/=19/30 to decrease fall risk potential with functional mobility using LRAD     Baseline  Improved to 14/30.    Status  Not Met      UPDATED SHORT AND LONG TERM GOALS:  PT Short Term Goals - 04/04/18 1210      PT SHORT TERM GOAL #1   Title  The patient will be indep with progression of gym routine.    Time  1    Period  Months    Status  New    Target Date  05/04/18      PT Long Term Goals - 04/04/18 1211      PT LONG TERM GOAL #1   Title  The patient will improve gait speed from 2.09 ft/sec without device to > or equal to 2.4 ft/sec without device.    Time  2    Period  Months    Status  New    Target Date  06/03/18      PT LONG TERM GOAL #2   Title  The patient will be indep within the house with no device.    Time  2    Period  Months    Status  New    Target Date  06/03/18            Plan - 04/04/18 1052    Clinical Impression Statement  The patient met 4/6 LTGs.  PT and patient discussed ongoing gym routine and made recommendations to address continued weakness.  PT  and patient discussed f/u in 4-6 weeks to check on gym routine since modifications were made today.  Also anticipate as he gains strength, PT will be able to help progress off of [place] safely and continue to modify community wellness routine to meet patient's needs. Patient agrees with plan.     PT Frequency  1x / week    PT Treatment/Interventions  ADLs/Self Care Home Management;Cryotherapy;Electrical Stimulation;Moist Heat;DME Instruction;Gait Scientist, forensic;Therapeutic activities;Functional mobility training;Therapeutic exercise;Balance training;Neuromuscular re-education;Patient/family education;Orthotic Fit/Training;Passive range of motion;Taping;Aquatic Therapy;Dry needling;Manual techniques    PT Next Visit Plan  check how gym routine is going; remeasure MMT, remeasure gait speed.  Provide guidance on walking at slow pace on treadmill at gym for repetition on good  gait mechanics with B UE support.     Consulted and Agree with Plan of Care  Patient        Patient will benefit from skilled therapeutic intervention in order to improve the following deficits and impairments:  Abnormal gait, Decreased balance, Decreased coordination, Decreased strength, Difficulty walking, Increased muscle spasms, Impaired sensation, Pain, Impaired tone, Decreased range of motion  Visit Diagnosis: Other abnormalities of gait and mobility  Muscle weakness (generalized)  Unsteadiness  Other symptoms and signs involving the nervous system     Problem List Patient Active Problem List   Diagnosis Date Noted  . S/P lumbar laminectomy 12/27/2017  . Paraplegia ([place]) 11/07/2017  . Quadriplegia, unspecified ([place]) 11/07/2017  . Leukocytosis   . Neuropathic pain   . Drug induced constipation   . Urinary retention   . Class 1 obesity due to excess calories with serious comorbidity and body mass index (BMI) of 33.0 to 33.9 in adult   . Myelopathy ([place]) 08/01/2017  . Thoracic spinal stenosis 07/31/2017     Monika Chestang, PT 04/04/2018, 12:09 PM  [facility] [address] [place], [place], [address] Phone: [phone]   Fax:  [phone]  Name: Scott Lane MRN: [id number] Date of Birth: [birth year]

## 2018-05-15 DIAGNOSIS — R030 Elevated blood-pressure reading, without diagnosis of hypertension: Secondary | ICD-10-CM | POA: Diagnosis not present

## 2018-05-15 DIAGNOSIS — M4804 Spinal stenosis, thoracic region: Secondary | ICD-10-CM | POA: Diagnosis not present

## 2018-05-15 DIAGNOSIS — Z6834 Body mass index (BMI) 34.0-34.9, adult: Secondary | ICD-10-CM | POA: Diagnosis not present

## 2018-05-16 ENCOUNTER — Ambulatory Visit: Payer: 59 | Attending: Physical Medicine & Rehabilitation | Admitting: Rehabilitative and Restorative Service Providers"

## 2018-05-16 DIAGNOSIS — R2689 Other abnormalities of gait and mobility: Secondary | ICD-10-CM | POA: Insufficient documentation

## 2018-05-16 DIAGNOSIS — M6281 Muscle weakness (generalized): Secondary | ICD-10-CM | POA: Diagnosis not present

## 2018-05-16 DIAGNOSIS — R2681 Unsteadiness on feet: Secondary | ICD-10-CM | POA: Diagnosis not present

## 2018-05-16 DIAGNOSIS — R29818 Other symptoms and signs involving the nervous system: Secondary | ICD-10-CM | POA: Insufficient documentation

## 2018-05-16 NOTE — Therapy (Addendum)
[facility] [address] [place], [place], [address] Phone: [phone]   Fax:  [phone]  Physical Therapy Treatment  Patient Details  Name: Scott Lane MRN: [id number] Date of Birth: [birth year] Referring Provider (PT): Sherley Bounds, MD   Encounter Date: 05/16/2018  PT End of Session - 05/16/18 1202    Visit Number  31    Authorization Type  [employer].  VL: MN    PT Start Time  0840    PT Stop Time  0930    PT Time Calculation (min)  50 min    Activity Tolerance  Patient tolerated treatment well    Behavior During Therapy  WFL for tasks assessed/performed       Past Medical History:  Diagnosis Date  . Chicken pox     Past Surgical History:  Procedure Laterality Date  . LUMBAR LAMINECTOMY/DECOMPRESSION MICRODISCECTOMY N/A 08/01/2017   Procedure: Thoracic nine, ten, eleven, twelve laminectomy;  Surgeon: Eustace Moore, MD;  Location: [facility];  Service: Neurosurgery;  Laterality: N/A;  . none    . THORACIC DISCECTOMY N/A 12/27/2017   Procedure: Laminectomy and Foraminotomy - Thoracic one - Thoracic three;  Surgeon: Eustace Moore, MD;  Location: [facility];  Service: Neurosurgery;  Laterality: N/A;  . WISDOM TOOTH EXTRACTION     bilateral lower.    There were no vitals filed for this visit.  Subjective Assessment - 05/16/18 0840    Subjective  The patient is walking without a device and is going to the gym 5-6 days/week.  He notes some tightness in his back "most days".      Patient Stated Goals  Ambulation with no AD to get back to prior level of function.     Currently in Pain?  No/denies         [facility] PT Assessment - 05/16/18 1287      Ambulation/Gait   Ambulation/Gait Assistance  6: Modified independent (Device/Increase time)   slowed pace   Ambulation/Gait Assistance Details  Patient ambulates into clinic without a device mod indep at slowed pace.  He has evidence of L knee genu  recurvatum transitioning from heel strike to mid stance phase of gait.  He has a lateral deviation of the tibia on the left as he loads from heel strike through full foot contact (has a supinated position).    Ambulation Distance (Feet)  120 Feet    Assistive device  None    Gait Pattern  Left genu recurvatum    Stairs  Yes    Stairs Assistance  6: Modified independent (Device/Increase time)    Stair Management Technique  Alternating pattern    Number of Stairs  4                   [facility] Adult PT Treatment/Exercise - 05/16/18 8676      Ambulation/Gait   Ambulation/Gait  Yes    Ambulation Surface  Level;Indoor    Gait velocity  2.10 ft/sec without device      Self-Care   Self-Care  Other Self-Care Comments    Other Self-Care Comments   PT and patient discussed continued progression of HEP and gym routine.  We added stretching and discussed further strengthening recommendations.  PT and patient discussed when further therapy may be indicated -- he eventually would like to progress to jogging.  PT recommended he begin jogging in the pool and work on single leg stance activities (added to  HEP) dec'ing knee locking to gain greater single leg control.  Discussed trying small jumps near a counter top to begin to work on plyometric training needed for future jogging.  PT thinks he needs further knee control to ensure dec'd risk of knee buckling with faster loading.      Neuro Re-ed    Neuro Re-ed Details   Standing on bosu performing mini squats, heel raises on bilateral LEs off edge of step with UE support progressing to left leg standing with attempted heel raise (locks into knee recurvatum).    Tall kneeling to 1/2 kneeling dec'ing UE support.  Quadriped hip extension with knee flexion.      Exercises   Exercises  Other Exercises    Other Exercises   Standing heel cord stretch, standing quad stretch, seated quad stretch, thomas test hip flexor stretch, and hip flexor stretch in tall  kneeling to low lunge.                PT Education - 05/16/18 1201    Education Details  provided further stretching for HEP and recommended adding hip extension machine to gym routine.    Person(s) Educated  Patient    Methods  Explanation;Demonstration;Handout    Comprehension  Verbalized understanding;Returned demonstration       PT Short Term Goals - 05/16/18 1203      PT SHORT TERM GOAL #1   Title  The patient will be indep with progression of gym routine.    Time  1    Period  Months    Status  Achieved        PT Long Term Goals - 05/16/18 1203      PT LONG TERM GOAL #1   Title  The patient will improve gait speed from 2.09 ft/sec without device to > or equal to 2.4 ft/sec without device.    Baseline  Patient's gait speed is 2.1 ft/sec without a device.     Time  2    Period  Months    Status  Not Met      PT LONG TERM GOAL #2   Title  The patient will be indep within the house with no device.    Time  2    Period  Months    Status  Achieved            Plan - 05/16/18 1212    Clinical Impression Statement  The patient met 1 STG and 1 LTG.  His gait speed has not improved a significant amount at this time.  He is progressing strengthening program through community/gym routine.  PT focused today on stretching, high level balance and L Leg control.  Discharging today, however patient to f/u with [place] by phone with questions regarding program.    PT Treatment/Interventions  ADLs/Self Care Home Management;Cryotherapy;Electrical Stimulation;Moist Heat;DME Instruction;Gait Scientist, forensic;Therapeutic activities;Functional mobility training;Therapeutic exercise;Balance training;Neuromuscular re-education;Patient/family education;Orthotic Fit/Training;Passive range of motion;Taping;Aquatic Therapy;Dry needling;Manual techniques    PT Next Visit Plan  discharge today.    Consulted and Agree with Plan of Care  Patient       Patient will benefit from skilled  therapeutic intervention in order to improve the following deficits and impairments:  Abnormal gait, Decreased balance, Decreased coordination, Decreased strength, Difficulty walking, Increased muscle spasms, Impaired sensation, Pain, Impaired tone, Decreased range of motion  Visit Diagnosis: Other abnormalities of gait and mobility  Muscle weakness (generalized)  Unsteadiness  Other symptoms and signs involving the nervous system  PHYSICAL THERAPY DISCHARGE SUMMARY  Visits from Start of Care: 31  Current functional level related to goals / functional outcomes: See above   Remaining deficits: Muscle weakness Gait abnormality   Education / Equipment: Home program, community wellness.  Plan: Patient agrees to discharge.  Patient goals were partially met. Patient is being discharged due to meeting the stated rehab goals.  ?????         Problem List Patient Active Problem List   Diagnosis Date Noted  . S/P lumbar laminectomy 12/27/2017  . Paraplegia ([place]) 11/07/2017  . Quadriplegia, unspecified ([place]) 11/07/2017  . Leukocytosis   . Neuropathic pain   . Drug induced constipation   . Urinary retention   . Class 1 obesity due to excess calories with serious comorbidity and body mass index (BMI) of 33.0 to 33.9 in adult   . Myelopathy ([place]) 08/01/2017  . Thoracic spinal stenosis 07/31/2017    Greer Wainright , PT 05/16/2018, 12:14 PM  [facility] [address] [place], [place], [address] Phone: [phone]   Fax:  [phone]  Name: Scott Lane MRN: [id number] Date of Birth: March 17, 1986

## 2018-05-16 NOTE — Patient Instructions (Signed)
Access Code: [email or web address]  URL: [email or web address]  Date: 05/16/2018  Prepared by: Margretta Ditty   Exercises Seated Hip Flexor Stretch - 3 reps - 1 sets - 30 seconds hold - 1x daily - 7x weekly Standing Gastroc Stretch - 10 reps - 3 sets - 1x daily - 7x weekly Single Leg Stance - 10 reps - 3 sets - 1x daily - 7x weekly Squat on Flat Side of BOSU - 10 reps - 3 sets - 1x daily - 7x weekly

## 2018-05-23 ENCOUNTER — Telehealth: Payer: Self-pay | Admitting: Neurology

## 2018-05-23 MED ORDER — INCOBOTULINUMTOXINA 100 UNITS IM SOLR: 600.0000 [IU] | INTRAMUSCULAR | 3 refills | Status: DC

## 2018-05-23 NOTE — Telephone Encounter (Signed)
Scott Lane, could you send a script over to Scott Lane for this patients Xeomin?

## 2018-05-27 NOTE — Telephone Encounter (Signed)
I called to check status of patients medication. They stated that a PA was needed. I asked her to transfer me to the PA line. After looking they stated the insurance was inactive for this patient.

## 2018-05-27 NOTE — Telephone Encounter (Signed)
Noted, thank you

## 2018-05-29 NOTE — Telephone Encounter (Signed)
Pt has called back to give the following [facility]  Member RP#[id number] [phone]  RX NHA#[id number]

## 2018-05-29 NOTE — Telephone Encounter (Signed)
I called to check with the patient about new insurance information. He did not answer so I left a VM asking him to call me back. If he calls back please ask him for his new coverage. DW

## 2018-06-03 NOTE — Telephone Encounter (Signed)
I called [employer] to check coverage and pre cert requirements. [id number] and [id number]- [employer] eligible for B/B. Ref#[id number].

## 2018-06-09 ENCOUNTER — Ambulatory Visit (INDEPENDENT_AMBULATORY_CARE_PROVIDER_SITE_OTHER): Payer: 59 | Admitting: Neurology

## 2018-06-09 ENCOUNTER — Other Ambulatory Visit: Payer: Self-pay

## 2018-06-09 ENCOUNTER — Encounter: Payer: Self-pay | Admitting: Neurology

## 2018-06-09 VITALS — BP 139/86 | HR 88 | Resp 14 | Ht 72.0 in | Wt 250.0 lb

## 2018-06-09 DIAGNOSIS — G8250 Quadriplegia, unspecified: Secondary | ICD-10-CM

## 2018-06-09 MED ORDER — INCOBOTULINUMTOXINA 100 UNITS IM SOLR
600.0000 [IU] | INTRAMUSCULAR | Status: DC
  Administered 2018-06-09: 600 [IU] via INTRAMUSCULAR

## 2018-06-09 NOTE — Progress Notes (Signed)
PATIENT: Scott Lane DOB: [birth year]  Chief Complaint  Patient presents with  . Quadriplegia    Rm. 4.  Xeomin 100 units x 6 vials - office supply     HISTORICAL  KEYJUAN Lane is 33 years old male, seen in request by his primary care physician Dr.Stephen Durene Cal for evaluation of Botox injection for spastic left lower extremity, He is accompanied by his wife, initial evaluation was on November 07, 2017.  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  MRI of cervical spine, congenital canal narrowing, no significant canal or foraminal narrowing,  He underwent decompressive thoracic laminectomy, medial facetectomy T9-10 and T10-11 T11-12 by Marikay Alar, MD  Post surgically,he had significant recovery with ongoing physical therapy, but he still has significant bilateral lower extremity spasticity,  left worse than right, difficulty clearing left leg from floor, mild urinary spasticity, hesitant to start  UPDATE December 02 2017: He has significant spasticity of left lower extremity, tried baclofen with tizanidine, cause drowsiness without provide significant help.   He has appointment with neurosurgeon in September 2019, consider upper thoracic decompression surgery,  Today is his first electrical stimulation guided xeomin injection for spastic left lower extremity,  UPDATE Mar 11 2018: We used 300 units of Xeomin for his initial injection December 02, 2017, he only noticed transient short lasting improvement, we are going to increase the dosage to 600 units today.  UPDATE June 09 2018: He reported 80% improvement of his left lower extremity spasticity with Xeomin injection 600 units in December 2019, no significant side effect, muscle weakness noted.   REVIEW OF SYSTEMS: Full 14 system review of systems performed and notable only for as above  ALLERGIES: No Known Allergies  HOME MEDICATIONS: Current Outpatient Medications  Medication Sig Dispense Refill  . B Complex Vitamins (B COMPLEX PO) Take 1 tablet by mouth daily.    . baclofen (LIORESAL) 20 MG tablet Take 20 mg by mouth 3 (three) times daily.     Marland Kitchen co-enzyme Q-10 30 MG capsule Take 30 mg by mouth 3 (three) times daily.    Marland Kitchen incobotulinumtoxinA (XEOMIN) 100 units SOLR injection Inject 600 Units into the muscle every 3 (three) months. 6 each 3  . Multiple Vitamins-Minerals (ONE DAILY MULTIVITAMIN MEN PO) Take 1 tablet by mouth daily.     No current facility-administered medications for this visit.     PAST MEDICAL HISTORY: Past Medical History:  Diagnosis Date  . Chicken pox  PAST SURGICAL HISTORY: Past Surgical History:  Procedure Laterality Date  . LUMBAR LAMINECTOMY/DECOMPRESSION MICRODISCECTOMY N/A 08/01/2017   Procedure: Thoracic nine, ten, eleven, twelve laminectomy;  Surgeon: Tia Alert, MD;  [facility]: [facility]  OR;  Service: Neurosurgery;  Laterality: N/A;  . none    . THORACIC DISCECTOMY N/A 12/27/2017   Procedure: Laminectomy and Foraminotomy - Thoracic one - Thoracic three;  Surgeon: Tia Alert, MD;  Location: [facility] OR;  Service: Neurosurgery;  Laterality: N/A;  . WISDOM TOOTH EXTRACTION     bilateral lower.    FAMILY HISTORY: Family History  Problem Relation Age of Onset  . Hypertension Mother   . Diabetes Mother   . Hypertension Father   . Lung cancer Father        smoker  . Hypertension Brother   . Hypertension Maternal Grandmother   . Lung cancer Maternal Grandmother        non smoker  . Hypertension Paternal Grandmother     SOCIAL HISTORY: Social History   Socioeconomic History  . Marital status: Married    Spouse name: Not on file  . Number of children: Not on file  . Years of education: Not on file  . Highest education level: Not on file  Occupational History  . Not on file  Social Needs  . Financial resource strain: Not on file  . Food insecurity:    Worry: Not on file    Inability: Not on file  . Transportation needs:    Medical: Not on file    Non-medical: Not on file  Tobacco Use  . Smoking status: Never Smoker  . Smokeless tobacco: Never Used  Substance and Sexual Activity  . Alcohol use: Yes    Comment: occasional on weekend  . Drug use: No  . Sexual activity: Yes  Lifestyle  . Physical activity:    Days per week: Not on file    Minutes per session: Not on file  . Stress: Not on file  Relationships  . Social connections:    Talks on phone: Not on file    Gets together: Not on file    Attends religious service: Not on file    Active member of club or organization: Not on file    Attends meetings of clubs or organizations: Not on file    Relationship status: Not on file  . Intimate partner violence:    Fear of current or ex partner: Not on file    Emotionally abused: Not on file    Physically abused: Not on file    Forced sexual activity: Not  on file  Other Topics Concern  . Not on file  Social History Narrative   Married 2018. Wife pregnant with first child- daughter.    Wife works IT with [employer].       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      PHYSICAL EXAM   Vitals:   06/09/18 1323  BP: 139/86  Pulse: 88  Resp: 14  Weight: 250 lb (113.4 kg)  Height: 6' (1.829 m)    Not recorded      Body mass index is 33.91 kg/m.  PHYSICAL EXAMNIATION:  Gen: NAD, conversant, well nourised, obese, well groomed  Cardiovascular: Regular rate rhythm, no peripheral edema, warm, nontender. Eyes: Conjunctivae clear without exudates or hemorrhage Neck: Supple, no carotid bruits. Pulmonary: Clear to auscultation bilaterally   NEUROLOGICAL EXAM:  MENTAL STATUS: Speech:    Speech is normal; fluent and spontaneous with normal comprehension.  Cognition:     Orientation to time, place and person     Normal recent and remote memory     Normal Attention span and concentration     Normal Language, naming, repeating,spontaneous speech     Fund of knowledge   CRANIAL NERVES: CN II: Visual fields are full to confrontation. Fundoscopic exam is normal with sharp discs and no vascular changes. Pupils are round equal and briskly reactive to light. CN III, IV, VI: extraocular movement are normal. No ptosis. CN V: Facial sensation is intact to pinprick in all 3 divisions bilaterally. Corneal responses are intact.  CN VII: Face is symmetric with normal eye closure and smile. CN VIII: Hearing is normal to rubbing fingers CN IX, X: Palate elevates symmetrically. Phonation is normal. CN XI: Head turning and shoulder shrug are intact CN XII: Tongue is midline with normal movements and no atrophy.  MOTOR: Bilateral upper extremities normal, bilateral lower extremity  spasticity, mild on the right side, mild to moderate on the left side, there was no significant muscle weakness,  REFLEXES: Reflexes are 2+ and symmetric at the biceps, triceps, 3 knees, and ankles, with sustained ankle clonus on the left side. Plantar responses are extensor bilaterally  SENSORY: Intact to light touch, pinprick, positional sensation and vibratory sensation are intact in fingers and toes.  COORDINATION: Rapid alternating movements and fine finger movements are intact. There is no dysmetria on finger-to-nose and heel-knee-shin.    GAIT/STANCE: He needs push up to get up from seated position, cautious, stiff, dragging left leg.  DIAGNOSTIC DATA (LABS, IMAGING, TESTING) - I reviewed patient records, labs, notes, testing and imaging myself where available.   ASSESSMENT AND PLAN  KEYMARION MUMMA is a 33 y.o. male   Thoracic myelopathy with residue spastic paraplegia   Left worse than right, first electrical stimulation guided xeomin injection for spastic left lower extremity on December 02, 2017,   We used xeomin 600 units daily   Left adductor longus 100 units  Left adductor magnus 100 units  Left semimembranosus 50 units  Left semitendinosus 50 units  Left biceps femoris short head 50 untis  Left vastus medialis 50 units  Left rectus femoris 50 units   Left tibialis posterior 50 units  Left flexor digitorum longus 50 units  Left medial gastrocnemius 50 units   Levert Feinstein, M.D. Ph.D.  [facility] [address], [address] [place], [place] [address] Ph: [phone] Fax: [phone]  CC: Referring Provider

## 2018-06-09 NOTE — Progress Notes (Signed)
**  Xeomin 100 units x 6 vials, NDC 0259-1610-01, Lot [id number], Exp 07/2020, office supply.//mck,rn** 

## 2018-06-10 ENCOUNTER — Telehealth: Payer: Self-pay | Admitting: Neurology

## 2018-06-10 ENCOUNTER — Ambulatory Visit: Payer: Self-pay | Admitting: Neurology

## 2018-09-10 ENCOUNTER — Encounter: Payer: Self-pay | Admitting: Neurology

## 2018-09-10 ENCOUNTER — Ambulatory Visit: Payer: Self-pay | Admitting: Neurology

## 2018-09-10 ENCOUNTER — Other Ambulatory Visit: Payer: Self-pay

## 2018-09-10 ENCOUNTER — Ambulatory Visit (INDEPENDENT_AMBULATORY_CARE_PROVIDER_SITE_OTHER): Payer: 59 | Admitting: Neurology

## 2018-09-10 VITALS — BP 132/74 | HR 68 | Temp 98.2°F | Ht 72.0 in | Wt 260.5 lb

## 2018-09-10 DIAGNOSIS — G8250 Quadriplegia, unspecified: Secondary | ICD-10-CM

## 2018-09-10 MED ORDER — INCOBOTULINUMTOXINA 100 UNITS IM SOLR
600.0000 [IU] | INTRAMUSCULAR | Status: DC
  Administered 2018-09-10: 600 [IU] via INTRAMUSCULAR

## 2018-09-10 NOTE — Progress Notes (Signed)
**  Xeomin 100 units x 6 vials, NDC 0259-1610-01, Lot [id number], Exp 07/2020, office supply.//mck,rn** 

## 2018-09-10 NOTE — Progress Notes (Signed)
PATIENT: Scott Lane DOB: 1985/06/08  Chief Complaint  Patient presents with  . Quadriplegia    Xeomin 100 units x 6 vials - office supply     HISTORICAL  Scott Lane is 32 years old male, seen in request by his primary care physician Dr.Stephen Durene Cal for evaluation of Botox injection for spastic left lower extremity, He is accompanied by his wife, initial evaluation was on November 07, 2017.  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  MRI of cervical spine, congenital canal narrowing, no significant canal or foraminal narrowing,  He underwent decompressive thoracic laminectomy, medial facetectomy T9-10 and T10-11 T11-12 by Marikay Alar, MD  Post surgically,he had significant recovery with ongoing physical therapy, but he still has significant bilateral lower extremity spasticity, left worse  than right, difficulty clearing left leg from floor, mild urinary spasticity, hesitant to start  UPDATE December 02 2017: He has significant spasticity of left lower extremity, tried baclofen with tizanidine, cause drowsiness without provide significant help.   He has appointment with neurosurgeon in September 2019, consider upper thoracic decompression surgery,  Today is his first electrical stimulation guided xeomin injection for spastic left lower extremity,  UPDATE Mar 11 2018: We used 300 units of Xeomin for his initial injection December 02, 2017, he only noticed transient short lasting improvement, we are going to increase the dosage to 600 units today.  UPDATE June 09 2018: He reported 80% improvement of his left lower extremity spasticity with Xeomin injection 600 units in December 2019, no significant side effect, muscle weakness noted.  UPDATE September 10 2018: He responded very well to previous injection, there is no significant side effect noticed.  REVIEW OF SYSTEMS: Full 14 system review of systems performed and notable only for as above  ALLERGIES: No Known Allergies  HOME MEDICATIONS: Current Outpatient Medications  Medication Sig Dispense Refill  . B Complex Vitamins (B COMPLEX PO) Take 1 tablet by mouth daily.    . baclofen (LIORESAL) 20 MG tablet Take 20 mg by mouth 3 (three) times daily.     Marland Kitchen co-enzyme Q-10 30 MG capsule Take 30 mg by mouth 3 (three) times daily.    Marland Kitchen incobotulinumtoxinA (XEOMIN) 100 units SOLR injection Inject 600 Units into the muscle every 3 (three) months. 6 each 3  . Multiple Vitamins-Minerals (ONE DAILY MULTIVITAMIN MEN PO) Take 1 tablet by mouth daily.     No current facility-administered medications for this visit.     PAST  MEDICAL HISTORY: Past Medical History:  Diagnosis Date  . Chicken pox     PAST SURGICAL HISTORY: Past Surgical History:  Procedure Laterality Date  . LUMBAR LAMINECTOMY/DECOMPRESSION MICRODISCECTOMY N/A 08/01/2017    Procedure: Thoracic nine, ten, eleven, twelve laminectomy;  Surgeon: Tia AlertJones, David S, MD;  [facility];  Service: Neurosurgery;  Laterality: N/A;  . none    . THORACIC DISCECTOMY N/A 12/27/2017   Procedure: Laminectomy and Foraminotomy - Thoracic one - Thoracic three;  Surgeon: Tia AlertJones, David S, MD;  [facility];  Service: Neurosurgery;  Laterality: N/A;  . WISDOM TOOTH EXTRACTION     bilateral lower.    FAMILY HISTORY: Family History  Problem Relation Age of Onset  . Hypertension Mother   . Diabetes Mother   . Hypertension Father   . Lung cancer Father        smoker  . Hypertension Brother   . Hypertension Maternal Grandmother   . Lung cancer Maternal Grandmother        non smoker  . Hypertension Paternal Grandmother     SOCIAL HISTORY: Social History   Socioeconomic History  . Marital status: Married    Spouse name: Not on file  . Number of children: Not on file  . Years of education: Not on file  . Highest education level: Not on file  Occupational History  . Not on file  Social Needs  . Financial resource strain: Not on file  . Food insecurity:    Worry: Not on file    Inability: Not on file  . Transportation needs:    Medical: Not on file    Non-medical: Not on file  Tobacco Use  . Smoking status: Never Smoker  . Smokeless tobacco: Never Used  Substance and Sexual Activity  . Alcohol use: Yes    Comment: occasional on weekend  . Drug use: No  . Sexual activity: Yes  Lifestyle  . Physical activity:    Days per week: Not on file    Minutes per session: Not on file  . Stress: Not on file  Relationships  . Social connections:    Talks on phone: Not on file    Gets together: Not on file    Attends religious service: Not on file    Active member of club or organization: Not on file    Attends meetings of clubs or organizations: Not on file    Relationship status: Not on file  . Intimate partner violence:    Fear of current or ex partner: Not on file     Emotionally abused: Not on file    Physically abused: Not on file    Forced sexual activity: Not on file  Other Topics Concern  . Not on file  Social History Narrative   Married 2018. Wife pregnant with first child- daughter.    Wife works IT with [employer].       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 Spears every morning.      PHYSICAL EXAM   Vitals:   09/10/18 1513  BP: 132/74  Pulse: 68  Temp: 98.2 F (36.8 C)  Weight: 260 lb 8 oz (118.2 kg)  Height: 6' (1.829 m)    Not recorded      Body mass index is 35.33 kg/m.  PHYSICAL EXAMNIATION:  MOTOR: Bilateral upper extremities were normal, mild bilateral lower extremity spasticity, mild on the right side, mild to moderate on the left side, there was no significant muscle weakness,  GAIT/STANCE: He needs push up to get up from seated position, cautious, stiff, dragging left leg.  ASSESSMENT AND PLAN  RANBIR YOHEY is a 33 y.o. male   Thoracic myelopathy with residue spastic paraplegia   Left worse than right, first electrical stimulation guided xeomin injection for spastic left lower extremity on December 02, 2017,  We used xeomin 600 units daily  Left adductor longus 100 units Left adductor magnus 100 units Left semimembranosus 50 units Left semitendinosus 50 units Left biceps femoris short head 50 untis Left rectus femoris 50 units  Left tibialis posterior 100 units Left flexor digitorum longus 50 units Left medial gastrocnemius 50 units   Levert Feinstein, M.D. Ph.D.  [facility] [address], [address] [place], [place] [address] Ph: [phone] Fax: [phone]

## 2018-11-13 DIAGNOSIS — R030 Elevated blood-pressure reading, without diagnosis of hypertension: Secondary | ICD-10-CM | POA: Diagnosis not present

## 2018-11-13 DIAGNOSIS — Z6835 Body mass index (BMI) 35.0-35.9, adult: Secondary | ICD-10-CM | POA: Diagnosis not present

## 2018-11-13 DIAGNOSIS — M4804 Spinal stenosis, thoracic region: Secondary | ICD-10-CM | POA: Diagnosis not present

## 2018-11-24 ENCOUNTER — Telehealth: Payer: Self-pay

## 2018-11-24 NOTE — Telephone Encounter (Signed)
Patient notified of update  and verbalized understanding. Appt. Has been scheduled.

## 2018-11-24 NOTE — Telephone Encounter (Signed)
Last visit was over a year ago-would make sense to have him in for a visit to discuss- likely could also be virtual visit

## 2018-11-24 NOTE — Telephone Encounter (Signed)
Dr. Yong Channel, please advise regarding note for returning to the gym.

## 2018-11-24 NOTE — Telephone Encounter (Signed)
Copied from [place] [id number]. Topic: General - Inquiry >> Nov 24, 2018 11:21 AM Virl Axe D wrote: Reason for CRM: Pt called to request a medical necessity note from Dr. Yong Channel stating it is okay for him to return to the gym for exercise. Okay to send through [place]. Please advise.

## 2018-11-26 ENCOUNTER — Ambulatory Visit (INDEPENDENT_AMBULATORY_CARE_PROVIDER_SITE_OTHER): Payer: 59 | Admitting: Family Medicine

## 2018-11-26 ENCOUNTER — Encounter: Payer: Self-pay | Admitting: Family Medicine

## 2018-11-26 VITALS — Ht 72.0 in | Wt 250.0 lb

## 2018-11-26 DIAGNOSIS — G8250 Quadriplegia, unspecified: Secondary | ICD-10-CM | POA: Diagnosis not present

## 2018-11-26 DIAGNOSIS — M4804 Spinal stenosis, thoracic region: Secondary | ICD-10-CM | POA: Diagnosis not present

## 2018-11-26 NOTE — Progress Notes (Signed)
Phone [phone]   Subjective:  Virtual visit via Video note. Chief complaint: Chief Complaint  Patient presents with  . Follow-up   This visit type was conducted due to national recommendations for restrictions regarding the COVID-19 Pandemic (e.g. social distancing).  This format is felt to be most appropriate for this patient at this time balancing risks to patient and risks to population by having him in for in person visit.  No physical exam was performed (except for noted visual exam or audio findings with Telehealth visits).   Our team/I connected with Scott Lane at 10:00 AM EDT by a video enabled telemedicine application ([email or web address] or caregility through epic) and verified that I am speaking with the correct person using two identifiers.  Location patient: Home[place] Location provider: [facility], office Persons participating in the virtual visit:  patient  Our team/I discussed the limitations of evaluation and management by telemedicine and the availability of in person appointments. In light of current covid-19 pandemic, patient also understands that we are trying to protect them by minimizing in office contact if at all possible.  The patient expressed consent for telemedicine visit and agreed to proceed. Patient understands insurance will be billed.   ROS-walking without any significant difficulty recently.  No fever chills reported.  Doing well with exercise-no chest pain or shortness of breath reported  Past Medical History-  Patient Active Problem List   Diagnosis Date Noted  . Quadriplegia, unspecified ([place]) 11/07/2017    Priority: High  . Neuropathic pain     Priority: Medium  . Drug induced constipation     Priority: Medium  . Myelopathy ([place]) 08/01/2017    Priority: Medium  . Thoracic spinal stenosis 07/31/2017    Priority: Medium  . S/P lumbar laminectomy 12/27/2017    Priority: Low  . Paraplegia ([place]) 11/07/2017    Priority: Low  . Leukocytosis     Priority:  Low  . Urinary retention     Priority: Low  . Class 1 obesity due to excess calories with serious comorbidity and body mass index (BMI) of 33.0 to 33.9 in adult     Priority: Low    Medications- reviewed and updated Current Outpatient Medications  Medication Sig Dispense Refill  . B Complex Vitamins (B COMPLEX PO) Take 1 tablet by mouth daily.    Marland Kitchen co-enzyme Q-10 30 MG capsule Take 30 mg by mouth 3 (three) times daily.    Marland Kitchen incobotulinumtoxinA (XEOMIN) 100 units SOLR injection Inject 600 Units into the muscle every 3 (three) months. 6 each 3  . Multiple Vitamins-Minerals (ONE DAILY MULTIVITAMIN MEN PO) Take 1 tablet by mouth daily.     Current Facility-Administered Medications  Medication Dose Route Frequency Provider Last Rate Last Dose  . incobotulinumtoxinA (XEOMIN) 100 units injection 600 Units  600 Units Intramuscular Q90 days Marcial Pacas, MD   600 Units at 09/10/18 1707     Objective:  Ht 6' (1.829 m)   Wt 250 lb (113.4 kg)   BMI 33.91 kg/m  self reported vitals Gen: NAD, resting comfortably Lungs: nonlabored, normal respiratory rate  Skin: appears dry, no obvious rash     Assessment and Plan   #Thoracic spinal stenosis/quadriplegia S: Patient is 33 year old male that underwent decompressive thoracic laminectomy T9-T10, T10-T11, T11-T12 with Dr. Sherley Bounds over a year ago after being found to have severe thoracic spinal stenosis.  In April 2019 approximately 5 months prior-had lumbar laminectomy/discectomy he was treated in inpatient rehab initially-later discharged and sent home  with neurology, neurosurgery follow-up.  Patient has made significant strides since that time.  He even had gone back to the gym and started going in November or December before things closed down related to COVID.  He is remaining active by walking 2 miles every other day-he is considering jogging-ideally lower impact like a treadmill or even elliptical would be a good starting point instead of  running on track.  He needs [facility] medical necessity [facility]  Patient also continues to get injections with Dr. Terrace ArabiaYan related to spasticity in his legs.  Upper body seems to be doing reasonably well.  Next injections planned for September-he can usually tell things are wearing off but seems to be lasting longer than usual A/P: Patient is doing incredibly well-[facility] to allow him to continue to progress in athletic endeavors after difficult time with spinal stenosis last year.  #Social update-daughter was born August 5-now just over a year old  Recommended follow up: Recommended 6 to 3755-month physical Future Appointments  Date Time Provider Department Center  12/24/2018  3:00 PM Levert FeinsteinYan, Yijun, MD [facility] None    Lab/Order associations:   ICD-10-CM   1. Quadriplegia, unspecified (HCC)  G82.50   2. Thoracic spinal stenosis  M48.04    Return precautions advised.  Tana ConchStephen Jessicah Croll, MD

## 2018-11-26 NOTE — Patient Instructions (Addendum)
Health Maintenance Due  Topic Date Due  . INFLUENZA VACCINE  11/08/2018    Depression screen [facility] 2/9 09/19/2017 05/17/2017  Decreased Interest 0 0  Down, Depressed, Hopeless 0 0  PHQ - 2 Score 0 0

## 2018-12-11 DIAGNOSIS — N528 Other male erectile dysfunction: Secondary | ICD-10-CM | POA: Diagnosis not present

## 2018-12-22 ENCOUNTER — Telehealth: Payer: Self-pay

## 2018-12-22 NOTE — Telephone Encounter (Signed)
I called to check pre cert requirements for Xeomin the representative stated that [place] was required RSW#[id number]

## 2018-12-24 ENCOUNTER — Telehealth: Payer: Self-pay | Admitting: Neurology

## 2018-12-24 ENCOUNTER — Encounter: Payer: Self-pay | Admitting: Neurology

## 2018-12-24 ENCOUNTER — Ambulatory Visit (INDEPENDENT_AMBULATORY_CARE_PROVIDER_SITE_OTHER): Payer: 59 | Admitting: Neurology

## 2018-12-24 ENCOUNTER — Other Ambulatory Visit: Payer: Self-pay

## 2018-12-24 VITALS — BP 121/70 | HR 85 | Temp 98.2°F | Ht 72.0 in | Wt 262.5 lb

## 2018-12-24 DIAGNOSIS — G8250 Quadriplegia, unspecified: Secondary | ICD-10-CM

## 2018-12-24 MED ORDER — INCOBOTULINUMTOXINA 100 UNITS IM SOLR
600.0000 [IU] | INTRAMUSCULAR | Status: DC
  Administered 2018-12-24: 600 [IU] via INTRAMUSCULAR

## 2018-12-24 NOTE — Progress Notes (Signed)
PATIENT: Scott Lane DOB: [birth year]  Chief Complaint  Patient presents with  . Quadriplegia    RM EMG 4, alone. states he feels Xeomin inj have helped a lot. Xeomin 100 units x 6 vials - office supply     Scott Lane is 33 years old male, seen in request by his primary care physician Dr.Stephen Yong Channel for evaluation of Botox injection for spastic left lower extremity, He is accompanied by his wife, initial evaluation was on November 07, 2017.  He was a Psychologist, educational, used to work out regularly, in early April 2019, during the workout, he felt lower extremity weakness, left leg worse than the right side, difficulty clear left leg from the floor, on July 31, 2017, on golf course, he was throwing a ball to his partner, then had sudden onset dense numbness at bilateral lower extremity, collapsed to the floor, lower extremity weakness, I personally reviewed film on July 31, 2017 prior to his decompression surgery  MRI thoracic spine demonstrated mild congenital narrowing of the central canal throughout due to short pedicle length, severe central canal stenosis at T9-10 due to shallow disc bulging bulky ligamentum flavum thickening, edema of the cord at this level, also marked central canal stenosis at T11-12, ligamentum flavum thickening and shallow disc bulging at T2-3, causing moderate to severe central canal stenosis,  MRI of lumbar spine congenital lateral central spinal canal throughout, broad-based right paracentral protrusion at L4-5, impinging on descending right L5 nerve roots, moderate right foraminal narrowing, moderate to severe bilateral foraminal narrowing at L5-S1.  MRI of cervical spine, congenital canal narrowing, no significant canal or foraminal narrowing,  He underwent decompressive thoracic laminectomy, medial facetectomy T9-10 and T10-11 T11-12 by Sherley Bounds, MD  Post surgically,he had significant recovery with ongoing physical therapy, but he still  has significant bilateral lower extremity spasticity, left worse than right, difficulty clearing left leg from floor, mild urinary spasticity, hesitant to start  UPDATE December 02 2017: He has significant spasticity of left lower extremity, tried baclofen with tizanidine, cause drowsiness without provide significant help.   He has appointment with neurosurgeon in September 2019, consider upper thoracic decompression surgery,  Today is his first electrical stimulation guided xeomin injection for spastic left lower extremity,  UPDATE Mar 11 2018: We used 300 units of Xeomin for his initial injection December 02, 2017, he only noticed transient short lasting improvement, we are going to increase the dosage to 600 units today.  UPDATE June 09 2018: He reported 80% improvement of his left lower extremity spasticity with Xeomin injection 600 units in December 2019, no significant side effect, muscle weakness noted.  UPDATE September 10 2018: He responded very well to previous injection, there is no significant side effect noticed.  UPDATE Sept 16 2020: He responded very well to previous injection, he started workout regularly which has helped him  REVIEW OF SYSTEMS: Full 14 system review of systems performed and notable only for as above  ALLERGIES: No Known Allergies  HOME MEDICATIONS: Current Outpatient Medications  Medication Sig Dispense Refill  . B Complex Vitamins (B COMPLEX PO) Take 1 tablet by mouth daily.    Marland Kitchen co-enzyme Q-10 30 MG capsule Take 30 mg by mouth 3 (three) times daily.    Marland Kitchen incobotulinumtoxinA (XEOMIN) 100 units SOLR injection Inject 600 Units into the muscle every 3 (three) months. 6 each 3  . Multiple Vitamins-Minerals (ONE DAILY MULTIVITAMIN MEN PO) Take 1 tablet by mouth daily.  No current facility-administered medications for this visit.     PAST MEDICAL HISTORY: Past Medical History:  Diagnosis Date  . Chicken pox     PAST SURGICAL HISTORY: Past Surgical  History:  Procedure Laterality Date  . LUMBAR LAMINECTOMY/DECOMPRESSION MICRODISCECTOMY N/A 08/01/2017   Procedure: Thoracic nine, ten, eleven, twelve laminectomy;  Surgeon: Tia AlertJones, David S, MD;  [facility];  Service: Neurosurgery;  Laterality: N/A;  . none    . THORACIC DISCECTOMY N/A 12/27/2017   Procedure: Laminectomy and Foraminotomy - Thoracic one - Thoracic three;  Surgeon: Tia AlertJones, David S, MD;  [facility];  Service: Neurosurgery;  Laterality: N/A;  . WISDOM TOOTH EXTRACTION     bilateral lower.    FAMILY HISTORY: Family History  Problem Relation Age of Onset  . Hypertension Mother   . Diabetes Mother   . Hypertension Father   . Lung cancer Father        smoker  . Hypertension Brother   . Hypertension Maternal Grandmother   . Lung cancer Maternal Grandmother        non smoker  . Hypertension Paternal Grandmother     SOCIAL HISTORY: Social History   Socioeconomic History  . Marital status: Married    Spouse name: Not on file  . Number of children: Not on file  . Years of education: Not on file  . Highest education level: Not on file  Occupational History  . Not on file  Social Needs  . Financial resource strain: Not on file  . Food insecurity    Worry: Not on file    Inability: Not on file  . Transportation needs    Medical: Not on file    Non-medical: Not on file  Tobacco Use  . Smoking status: Never Smoker  . Smokeless tobacco: Never Used  Substance and Sexual Activity  . Alcohol use: Yes    Comment: occasional on weekend  . Drug use: No  . Sexual activity: Yes  Lifestyle  . Physical activity    Days per week: Not on file    Minutes per session: Not on file  . Stress: Not on file  Relationships  . Social Musicianconnections    Talks on phone: Not on file    Gets together: Not on file    Attends religious service: Not on file    Active member of club or organization: Not on file    Attends meetings of clubs or organizations: Not on file     Relationship status: Not on file  . Intimate partner violence    Fear of current or ex partner: Not on file    Emotionally abused: Not on file    Physically abused: Not on file    Forced sexual activity: Not on file  Other Topics Concern  . Not on file  Social History Narrative   Married 2018. Wife pregnant with first child- daughter.    Wife works IT with [employer].       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 [place] every morning.      PHYSICAL EXAM   Vitals:   12/24/18 1458  BP: 121/70  Pulse: 85  Temp: 98.2 F (36.8 C)  SpO2: 96%  Weight: 262 lb 8 oz (119.1 kg)  Height: 6' (1.829 m)    Not recorded  Body mass index is 35.6 kg/m.  PHYSICAL EXAMNIATION:  MOTOR: Bilateral upper extremities were normal, mild left leg spasticity, there is no significant muscle weakness  GAIT/STANCE: He needs push up to get up from seated position, cautious, mildly stiff on the left side  ASSESSMENT AND PLAN  Scott Lane is a 33 y.o. male   Thoracic myelopathy with residue spastic paraplegia   Left worse than right, first electrical stimulation guided xeomin injection for spastic left lower extremity on December 02, 2017,  We used xeomin 600 units daily   Left semimembranosus 150 units Left semitendinosus 100  units Left rectus femoris 50 units Left vastus lateralis 25 units  Left tibialis posterior 100 units Left flexor digitorum longus 100 units Left medial gastrocnemius 75 units    Levert Feinstein, M.D. Ph.D.  [facility] [address] [place], [place] [address] Ph: [phone] Fax: [phone]

## 2018-12-24 NOTE — Telephone Encounter (Signed)
noted 

## 2018-12-24 NOTE — Progress Notes (Signed)
**  Xeomin 100 units x 6 vials, NDC 0259-1610-01, Lot [id number], Exp 12/2020, office supply.//mck,rn** 

## 2018-12-24 NOTE — Telephone Encounter (Signed)
Please do not dissolve Xeomin until I evaluate him at his next injection in December 2020.

## 2019-02-16 ENCOUNTER — Other Ambulatory Visit: Payer: Self-pay

## 2019-02-16 DIAGNOSIS — Z20822 Contact with and (suspected) exposure to covid-19: Secondary | ICD-10-CM

## 2019-02-18 LAB — NOVEL CORONAVIRUS, NAA: SARS-CoV-2, NAA: NOT DETECTED

## 2019-02-19 ENCOUNTER — Other Ambulatory Visit: Payer: Self-pay

## 2019-02-19 ENCOUNTER — Encounter: Payer: Self-pay | Admitting: Family Medicine

## 2019-02-19 ENCOUNTER — Ambulatory Visit (INDEPENDENT_AMBULATORY_CARE_PROVIDER_SITE_OTHER): Payer: 59 | Admitting: Family Medicine

## 2019-02-19 ENCOUNTER — Encounter

## 2019-02-19 VITALS — BP 120/68 | HR 82 | Temp 98.7°F | Ht 72.0 in | Wt 262.2 lb

## 2019-02-19 DIAGNOSIS — G959 Disease of spinal cord, unspecified: Secondary | ICD-10-CM

## 2019-02-19 DIAGNOSIS — Z13220 Encounter for screening for lipoid disorders: Secondary | ICD-10-CM | POA: Diagnosis not present

## 2019-02-19 DIAGNOSIS — Z0000 Encounter for general adult medical examination without abnormal findings: Secondary | ICD-10-CM

## 2019-02-19 DIAGNOSIS — Z87898 Personal history of other specified conditions: Secondary | ICD-10-CM | POA: Diagnosis not present

## 2019-02-19 MED ORDER — TADALAFIL 20 MG PO TABS: 20.0000 mg | ORAL_TABLET | ORAL | 11 refills | Status: DC | PRN

## 2019-02-19 NOTE — Progress Notes (Signed)
Phone: [phone]    Subjective:  Patient presents today for their annual physical. Chief complaint-noted.   See problem oriented charting- Review of Systems  Constitutional: Negative.   HENT: Negative.   Eyes: Negative.   Respiratory: Negative.   Cardiovascular: Negative.   Gastrointestinal: Negative.   Genitourinary: Negative.   Musculoskeletal: Negative.   Skin: Negative.   Neurological: Negative.   Endo/Heme/Allergies: Negative.   Psychiatric/Behavioral: Negative.    The following were reviewed and entered/updated in epic: Past Medical History:  Diagnosis Date  . Chicken pox    Patient Active Problem List   Diagnosis Date Noted  . Quadriplegia, unspecified (HCC) 11/07/2017    Priority: High  . Neuropathic pain     Priority: Medium  . Drug induced constipation     Priority: Medium  . Myelopathy (HCC) 08/01/2017    Priority: Medium  . Thoracic spinal stenosis 07/31/2017    Priority: Medium  . S/P lumbar laminectomy 12/27/2017    Priority: Low  . Paraplegia (HCC) 11/07/2017    Priority: Low  . Leukocytosis     Priority: Low  . Urinary retention     Priority: Low  . Class 1 obesity due to excess calories with serious comorbidity and body mass index (BMI) of 33.0 to 33.9 in adult     Priority: Low   Past Surgical History:  Procedure Laterality Date  . LUMBAR LAMINECTOMY/DECOMPRESSION MICRODISCECTOMY N/A 08/01/2017   Procedure: Thoracic nine, ten, eleven, twelve laminectomy;  Surgeon: Tia AlertJones, David S, MD;  [facility];  Service: Neurosurgery;  Laterality: N/A;  . none    . THORACIC DISCECTOMY N/A 12/27/2017   Procedure: Laminectomy and Foraminotomy - Thoracic one - Thoracic three;  Surgeon: Tia AlertJones, David S, MD;  [facility];  Service: Neurosurgery;  Laterality: N/A;  . WISDOM TOOTH EXTRACTION     bilateral lower.    Family History  Problem Relation Age of Onset  . Hypertension Mother   . Diabetes Mother   . Hypertension Father   . Lung cancer  Father        smoker  . Hypertension Brother   . Hypertension Maternal Grandmother   . Lung cancer Maternal Grandmother        non smoker  . Hypertension Paternal Grandmother     Medications- reviewed and updated Current Outpatient Medications  Medication Sig Dispense Refill  . B Complex Vitamins (B COMPLEX PO) Take 1 tablet by mouth daily.    Marland Kitchen. co-enzyme Q-10 30 MG capsule Take 30 mg by mouth 3 (three) times daily.    Marland Kitchen. incobotulinumtoxinA (XEOMIN) 100 units SOLR injection Inject 600 Units into the muscle every 3 (three) months. 6 each 3  . Multiple Vitamins-Minerals (ONE DAILY MULTIVITAMIN MEN PO) Take 1 tablet by mouth daily.     Current Facility-Administered Medications  Medication Dose Route Frequency Provider Last Rate Last Dose  . incobotulinumtoxinA (XEOMIN) 100 units injection 600 Units  600 Units Intramuscular Q90 days Levert FeinsteinYan, Yijun, MD   600 Units at 12/24/18 1538    Allergies-reviewed and updated No Known Allergies  Social History   Social History Narrative   Married 2018. Wife pregnant with first child- daughter.    Wife works IT with [employer].       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 [place]ars every morning.  Objective:  BP 120/68   Pulse 82   Temp 98.7 F (37.1 C) (Temporal)   Ht 6' (1.829 m)   Wt 262 lb 3.2 oz (118.9 kg)   SpO2 95%   BMI 35.56 kg/m  Gen: NAD, resting comfortably HEENT: Mask not removed due to covid 19. TM normal. Bridge of nose normal. Eyelids normal.  Neck: no thyromegaly or cervical lymphadenopathy  CV: RRR no murmurs rubs or gallops Lungs: CTAB no crackles, wheeze, rhonchi Abdomen: soft/nontender/nondistended/normal bowel sounds. No rebound or guarding.  Ext: no edema Skin: warm, dry Neuro: grossly normal, moves all extremities, PERRLA    Assessment and Plan:  33 y.o. male presenting for annual  physical.  Health Maintenance counseling: 1. Anticipatory guidance: Patient counseled regarding regular dental exams q6 months, eye exams never had in the past-denies issues,  avoiding smoking and second hand smoke , limiting alcohol to 2 beverages per day perhaps 2-3 per week.  2. Risk factor reduction:  Advised patient of need for regular exercise and diet rich and fruits and vegetables to reduce risk of heart attack and stroke. Exercise- daily for 45 minutes cardio  And 30 minutes weight training. Diet-seen by dietitian who works with him on food choices- hoping to see some progress on weight soon.  Mon/thurs lunch and dinner no starches. Other days will have sweet potato or brown rice. Doing pretty good as far as decreasing sugar intake- may go 2 weeks without dessert. Peak was 260- down 3-5 lbs at home. Wants to target 240 or so Wt Readings from Last 3 Encounters:  02/19/19 262 lb 3.2 oz (118.9 kg)  12/24/18 262 lb 8 oz (119.1 kg)  11/26/18 250 lb (113.4 kg)  3. Immunizations/screenings/ancillary studies-declines flu shot  Immunization History  Administered Date(s) Administered  . Tdap 08/23/2017  4. Prostate cancer screening-  no family history, start at age 72  5. Colon cancer screening - no family history, start at age 40. No blood in stool at present.  6. Skin cancer screening/prevention-lower risk due to melanin content.  advised regular sunscreen use. Denies worrisome, changing, or new skin lesions.  7. Testicular cancer screening- advised monthly self exams never  8. STD screening- patient opts out  9. never smoker  Status of chronic or acute concerns   Thoracic myelopathy with residual spastic paraplegia-recently had electrical stimulation guided Xeomin injection on 12/24/2018 - lasting about 3 months and another planned in December- hoping to discontinue at that time.  - left leg still biggest problem area for him- trouble with stairs - feels like will have good idea by may 2021  about long term effects   Social update- daughter 68 months, wife doing well.   Screen lipids again. Mom has DM but he has not shown any evidence- consider glucose fasting in the future.   Traveled to [place] recently- had covid 19 when he got back home. Being cautious since getting home  Headache, sinus congestion a few weeks ago for a few days- would like antibody testing  cialis 5 mg is helpful for ED from urology- uses only at times- other times can succeed with firm erection. Sent in 20mg  tablets for him- but advised him to try 10 mg to start.    Recommended follow up: 1-2 years for physical Future Appointments  Date Time Provider [place]  03/26/2019  1:00 PM Marcial Pacas, MD [facility] None   Lab/Order associations: not  fasting   ICD-10-CM   1. Preventative health care  Z00.00 CBC  with Differential    Comprehensive metabolic panel    Lipid panel  2. Screening for hyperlipidemia  Z13.220 Lipid panel  3. Myelopathy (HCC)  G95.9 CBC with Differential    Comprehensive metabolic panel  4. History of nasal congestion  Z87.898 covid-19 coronavirus antibodies [facility]. SARS-COV-2 IgG   Return precautions advised.  Tana Conch, MD

## 2019-02-19 NOTE — Patient Instructions (Addendum)
Recommended follow up: 1-2 years for physical  Please stop by lab before you go If you do not have mychart- we will call you about results within 5 business days of [place] receiving them.  If you have mychart- we will send your results within 3 business days of [place] receiving them.  If abnormal or we want to clarify a result, we will call or mychart you to make sure you receive the message.  If you have questions or concerns or don't hear within 5-7 days, please send [place] a message or call us.

## 2019-02-20 LAB — LIPID PANEL
Cholesterol: 186 mg/dL (ref 0–200)
HDL: 47 mg/dL (ref >39.00)
LDL Cholesterol: 105 mg/dL — ABNORMAL HIGH (ref 0–99)
NonHDL: 138.9
Total CHOL/HDL Ratio: 4
Triglycerides: 168 mg/dL — ABNORMAL HIGH (ref 0.0–149.0)
VLDL: 33.6 mg/dL (ref 0.0–40.0)

## 2019-02-20 LAB — CBC WITH DIFFERENTIAL/PLATELET
Basophils Absolute: 0.1 10*3/uL (ref 0.0–0.1)
Basophils Relative: 1.1 % (ref 0.0–3.0)
Eosinophils Absolute: 0.1 10*3/uL (ref 0.0–0.7)
Eosinophils Relative: 2.2 % (ref 0.0–5.0)
HCT: 45.3 % (ref 39.0–52.0)
Hemoglobin: 15.3 g/dL (ref 13.0–17.0)
Lymphocytes Relative: 30.8 % (ref 12.0–46.0)
Lymphs Abs: 1.7 10*3/uL (ref 0.7–4.0)
MCHC: 33.8 g/dL (ref 30.0–36.0)
MCV: 88.2 fl (ref 78.0–100.0)
Monocytes Absolute: 0.8 10*3/uL (ref 0.1–1.0)
Monocytes Relative: 14 % — ABNORMAL HIGH (ref 3.0–12.0)
Neutro Abs: 2.9 10*3/uL (ref 1.4–7.7)
Neutrophils Relative %: 51.9 % (ref 43.0–77.0)
Platelets: 259 10*3/uL (ref 150.0–400.0)
RBC: 5.14 Mil/uL (ref 4.22–5.81)
RDW: 12.9 % (ref 11.5–15.5)
WBC: 5.6 10*3/uL (ref 4.0–10.5)

## 2019-02-20 LAB — COMPREHENSIVE METABOLIC PANEL
ALT: 37 U/L (ref 0–53)
AST: 43 U/L — ABNORMAL HIGH (ref 0–37)
Albumin: 4.3 g/dL (ref 3.5–5.2)
Alkaline Phosphatase: 60 U/L (ref 39–117)
BUN: 15 mg/dL (ref 6–23)
CO2: 28 mEq/L (ref 19–32)
Calcium: 9.4 mg/dL (ref 8.4–10.5)
Chloride: 102 mEq/L (ref 96–112)
Creatinine, Ser: 1.08 mg/dL (ref 0.40–1.50)
GFR: 94.65 mL/min (ref >60.00)
Glucose, Bld: 92 mg/dL (ref 70–99)
Potassium: 3.9 mEq/L (ref 3.5–5.1)
Sodium: 140 mEq/L (ref 135–145)
Total Bilirubin: 0.5 mg/dL (ref 0.2–1.2)
Total Protein: 7.5 g/dL (ref 6.0–8.3)

## 2019-02-20 LAB — SARS-COV-2 IGG: SARS-COV-2 IgG: 0.08

## 2019-03-26 ENCOUNTER — Other Ambulatory Visit: Payer: Self-pay

## 2019-03-26 ENCOUNTER — Encounter: Payer: Self-pay | Admitting: Neurology

## 2019-03-26 ENCOUNTER — Ambulatory Visit (INDEPENDENT_AMBULATORY_CARE_PROVIDER_SITE_OTHER): Payer: 59 | Admitting: Neurology

## 2019-03-26 VITALS — BP 136/87 | HR 91 | Temp 98.3°F | Ht 72.0 in | Wt 260.5 lb

## 2019-03-26 DIAGNOSIS — G8250 Quadriplegia, unspecified: Secondary | ICD-10-CM

## 2019-03-26 DIAGNOSIS — M4804 Spinal stenosis, thoracic region: Secondary | ICD-10-CM

## 2019-03-26 MED ORDER — ONABOTULINUMTOXINA 100 UNITS IJ SOLR: 600.0000 [IU] | Freq: Once | INTRAMUSCULAR | Status: DC

## 2019-03-26 MED ORDER — INCOBOTULINUMTOXINA 100 UNITS IM SOLR
600.0000 [IU] | INTRAMUSCULAR | Status: DC
  Administered 2019-03-26: 600 [IU] via INTRAMUSCULAR

## 2019-03-26 NOTE — Progress Notes (Signed)
PATIENT: Scott Lane DOB: [birth year]  Chief Complaint  Patient presents with  . Quadriplegia    Xeomin 100 units x 6 vials - office supply     Scott Lane is 33 years old male, seen in request by his primary care physician Dr.Stephen Yong Channel for evaluation of Botox injection for spastic left lower extremity, He is accompanied by his wife, initial evaluation was on November 07, 2017.  He was a Psychologist, educational, used to work out regularly, in early April 2019, during the workout, he felt lower extremity weakness, left leg worse than the right side, difficulty clear left leg from the floor, on July 31, 2017, on golf [place], he was throwing a ball to his partner, then had sudden onset dense numbness at bilateral lower extremity, collapsed to the floor, lower extremity weakness, I personally reviewed film on July 31, 2017 prior to his decompression surgery  MRI thoracic spine demonstrated mild congenital narrowing of the central canal throughout due to short pedicle length, severe central canal stenosis at T9-10 due to shallow disc bulging bulky ligamentum flavum thickening, edema of the cord at this level, also marked central canal stenosis at T11-12, ligamentum flavum thickening and shallow disc bulging at T2-3, causing moderate to severe central canal stenosis,  MRI of lumbar spine congenital lateral central spinal canal throughout, broad-based right paracentral protrusion at L4-5, impinging on descending right L5 nerve roots, moderate right foraminal narrowing, moderate to severe bilateral foraminal narrowing at L5-S1.  MRI of cervical spine, congenital canal narrowing, no significant canal or foraminal narrowing,  He underwent decompressive thoracic laminectomy, medial facetectomy T9-10 and T10-11 T11-12 by Scott Bounds, MD  Post surgically,he had significant recovery with ongoing physical therapy, but he still has significant bilateral lower extremity spasticity, left worse  than right, difficulty clearing left leg from floor, mild urinary spasticity, hesitant to start  UPDATE December 02 2017: He has significant spasticity of left lower extremity, tried baclofen with tizanidine, cause drowsiness without provide significant help.   He has appointment with neurosurgeon in September 2019, consider upper thoracic decompression surgery,  Today is his first electrical stimulation guided xeomin injection for spastic left lower extremity,  UPDATE Mar 11 2018: We used 300 units of Xeomin for his initial injection December 02, 2017, he only noticed transient short lasting improvement, we are going to increase the dosage to 600 units today.  UPDATE June 09 2018: He reported 80% improvement of his left lower extremity spasticity with Xeomin injection 600 units in December 2019, no significant side effect, muscle weakness noted.  UPDATE September 10 2018: He responded very well to previous injection, there is no significant side effect noticed.  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 B Complex Vitamins (B COMPLEX PO) Take 1 tablet by mouth daily.    Marland Kitchen co-enzyme Q-10 30 MG capsule Take 30 mg by mouth 3 (three) times daily.    Marland Kitchen incobotulinumtoxinA (XEOMIN) 100 units SOLR injection Inject 600 Units into the muscle every 3 (three) months. 6 each 3  .  Multiple Vitamins-Minerals (ONE DAILY MULTIVITAMIN MEN PO) Take 1 tablet by mouth daily.    . tadalafil (CIALIS) 20 MG tablet Take 1 tablet (20 mg total) by mouth  every other day as needed for erectile dysfunction. 10 tablet 11   No current facility-administered medications for this visit.    PAST MEDICAL HISTORY: Past Medical History:  Diagnosis Date  . Chicken pox     PAST SURGICAL HISTORY: Past Surgical History:  Procedure Laterality Date  . LUMBAR LAMINECTOMY/DECOMPRESSION MICRODISCECTOMY N/A 08/01/2017   Procedure: Thoracic nine, ten, eleven, twelve laminectomy;  Surgeon: Tia AlertJones, David S, MD;  [facility];  Service: Neurosurgery;  Laterality: N/A;  . none    . THORACIC DISCECTOMY N/A 12/27/2017   Procedure: Laminectomy and Foraminotomy - Thoracic one - Thoracic three;  Surgeon: Tia AlertJones, David S, MD;  [facility];  Service: Neurosurgery;  Laterality: N/A;  . WISDOM TOOTH EXTRACTION     bilateral lower.    FAMILY HISTORY: Family History  Problem Relation Age of Onset  . Hypertension Mother   . Diabetes Mother   . Hypertension Father   . Lung cancer Father        smoker  . Hypertension Brother   . Hypertension Maternal Grandmother   . Lung cancer Maternal Grandmother        non smoker  . Hypertension Paternal Grandmother     SOCIAL HISTORY: Social History   Socioeconomic History  . Marital status: Married    Spouse name: Not on file  . Number of children: Not on file  . Years of education: Not on file  . Highest education level: Not on file  Occupational History  . Not on file  Tobacco Use  . Smoking status: Never Smoker  . Smokeless tobacco: Never Used  Substance and Sexual Activity  . Alcohol use: Yes    Comment: occasional on weekend  . Drug use: No  . Sexual activity: Yes  Other Topics Concern  . Not on file  Social History Narrative   Married 2018. Wife pregnant with first child- daughter.    Wife works IT with [employer].       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 [place]  every morning.    Social Determinants of Health   Financial Resource Strain:   . Difficulty of Paying Living Expenses: Not on file  Food Insecurity:   . Worried About Programme researcher, broadcasting/film/videounning Out of Food in the Last Year: Not on file  . Ran Out of Food in the Last Year: Not on file  Transportation Needs:   . Lack of Transportation (Medical): Not on file  . Lack of Transportation (Non-Medical): Not on file  Physical Activity:   . Days of Exercise per Week: Not on file  . Minutes of Exercise per Session: Not on file  Stress:   . Feeling of Stress : Not on file  Social Connections:   . Frequency of Communication with Friends and Family: Not on file  . Frequency of Social Gatherings with Friends and Family: Not on file  . Attends Religious Services: Not on file  . Active Member of Clubs or Organizations: Not on file  . Attends BankerClub or Organization Meetings: Not on file  . Marital Status: Not on file  Intimate Partner Violence:   . Fear of Current  or Ex-Partner: Not on file  . Emotionally Abused: Not on file  . Physically Abused: Not on file  . Sexually Abused: Not on file     PHYSICAL EXAM   Vitals:   03/26/19 1306  BP: 136/87  Pulse: 91  Temp: 98.3 F (36.8 C)  Weight: 260 lb 8 oz (118.2 kg)  Height: 6' (1.829 m)    Not recorded      Body mass index is 35.33 kg/m.  PHYSICAL EXAMNIATION:  MOTOR: Bilateral upper extremities were normal, mild left leg spasticity, there is no significant muscle weakness  GAIT/STANCE: He can get up from seated position arms crossed, mild stiffness on the left side  ASSESSMENT AND PLAN  SKYLOR SCHNAPP is a 33 y.o. male   Thoracic myelopathy with residue spastic paraplegia   Left worse than right, first electrical stimulation guided xeomin injection for spastic left lower extremity on December 02, 2017,  We used Xeomin 600 units daily   Left semimembranosus 100 units Left semitendinosus 50   units Left rectus femoris 75 units Left vastus  lateralis 25 units  Left tibialis posterior 100 units Left flexor digitorum longus 100 units  Left adductor longus 50 Left gluteus medius 50  Left medial gastrocnemius 50 units    Levert Feinstein, M.D. Ph.D.  [facility] [address] [place], [place] [address] Ph: [phone] Fax: [phone]

## 2019-03-26 NOTE — Progress Notes (Signed)
xeomin 100 units x 6 vials, NDC 0370-4888-91, Lot [id number], Exp 10/2020, office supply.//mck,rn**

## 2019-05-20 IMAGING — RF DG C-ARM 61-120 MIN
1 series · 1 of 1 positions shown · non-contrast
Comparison: Thoracic spine CT 07/31/2017

CLINICAL DATA: DECOMPRESSIVE THORACIC LAMINECTOMY T1-T3

EXAM:
OPERATIVE THORACIC SPINE 1 VIEW(S)

[Series 1: run · 1 of 1 slices shown]
[im 1/1]
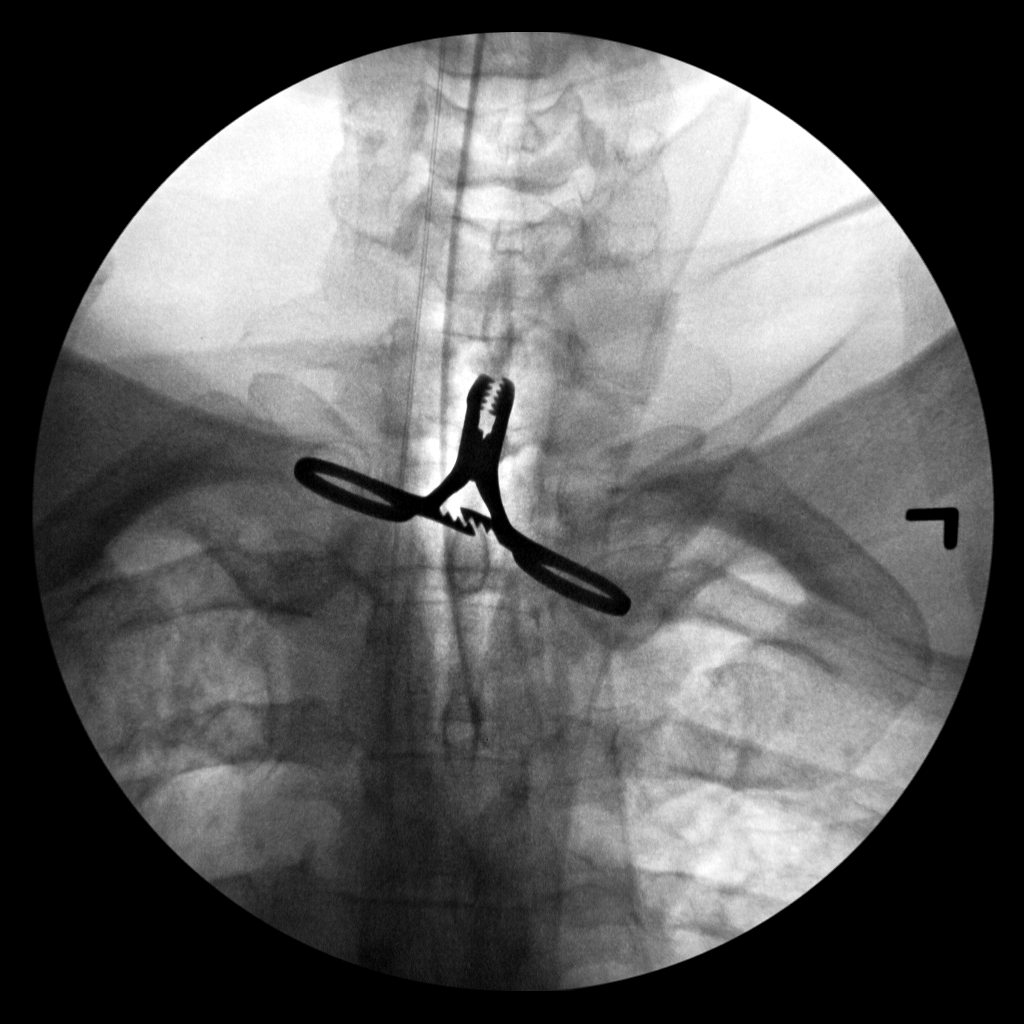

[1 of 1 positions shown; findings below may reference images not displayed]

FINDINGS: A single spot fluoro image obtained portably at 5856 hours is
submitted. The tip of a surgical clamp is superimposed on the C7
vertebral body. Endotracheal tube evident.
IMPRESSION: Intraoperative localization.

## 2019-06-11 NOTE — Telephone Encounter (Signed)
error 

## 2019-06-25 ENCOUNTER — Telehealth: Payer: Self-pay | Admitting: *Deleted

## 2019-06-25 NOTE — Telephone Encounter (Signed)
I called patients insurance company and spoke to [employer].  He states that [id number], [phone] and [id number] are covered and do not require PA but do require Predetermination.  Ref# for call is [id number].  He asked me to call [phone] to initiate the Predetermination.  I called and spoke to [facility].  She states that is true and asked that we send records to start the process. Ref# for this call is [id number].  Indicates this could take 2-4 weeks to obtain the Pre-D.

## 2019-07-01 ENCOUNTER — Ambulatory Visit: Payer: 59 | Admitting: Neurology

## 2019-07-01 ENCOUNTER — Encounter: Payer: Self-pay | Admitting: Family Medicine

## 2019-07-20 NOTE — Telephone Encounter (Signed)
I called [phone] to check the status of the Predetermination.  This was approved.  Approval number is [id number] Valid from 07/01/2019-06/30/2020.  B/B

## 2019-07-29 ENCOUNTER — Encounter: Payer: Self-pay | Admitting: Neurology

## 2019-07-29 ENCOUNTER — Other Ambulatory Visit: Payer: Self-pay

## 2019-07-29 ENCOUNTER — Ambulatory Visit (INDEPENDENT_AMBULATORY_CARE_PROVIDER_SITE_OTHER): Payer: 59 | Admitting: Neurology

## 2019-07-29 VITALS — BP 125/80 | HR 91 | Temp 97.9°F | Ht 72.0 in | Wt 258.5 lb

## 2019-07-29 DIAGNOSIS — G8250 Quadriplegia, unspecified: Secondary | ICD-10-CM

## 2019-07-29 MED ORDER — INCOBOTULINUMTOXINA 100 UNITS IM SOLR
600.0000 [IU] | INTRAMUSCULAR | Status: DC
  Administered 2019-07-29: 600 [IU] via INTRAMUSCULAR

## 2019-07-29 NOTE — Progress Notes (Signed)
**  Xeomin 200 units x 3 vials, NDC 6333-5456-25, Lot [id number], Exp 05/2021, office supply.//mck,rn**

## 2019-07-29 NOTE — Progress Notes (Signed)
PATIENT: Scott Lane DOB: [birth year]  Chief Complaint  Patient presents with  . Quadriplegia    Xeomin 200 units x 3 vials - office supply     HISTORICAL  Scott Lane is 34 years old male, seen in request by his primary care physician Dr.Stephen Durene Cal for evaluation of Botox injection for spastic left lower extremity, He is accompanied by his wife, initial evaluation was on November 07, 2017.  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  MRI of cervical spine, congenital canal narrowing, no significant canal or foraminal narrowing,  He underwent decompressive thoracic laminectomy, medial facetectomy T9-10 and T10-11 T11-12 by Marikay Alar, MD  Post surgically,he had significant recovery with ongoing physical therapy, but he still has significant bilateral lower extremity spasticity, left worse  than right, difficulty clearing left leg from floor, mild urinary spasticity, hesitant to start  UPDATE December 02 2017: He has significant spasticity of left lower extremity, tried baclofen with tizanidine, cause drowsiness without provide significant help.   He has appointment with neurosurgeon in September 2019, consider upper thoracic decompression surgery,  Today is his first electrical stimulation guided xeomin injection for spastic left lower extremity,  UPDATE Mar 11 2018: We used 300 units of Xeomin for his initial injection December 02, 2017, he only noticed transient short lasting improvement, we are going to increase the dosage to 600 units today.  UPDATE June 09 2018: He reported 80% improvement of his left lower extremity spasticity with Xeomin injection 600 units in December 2019, no significant side effect, muscle weakness noted.  UPDATE September 10 2018: He responded very well to previous injection, there is no significant side effect noticed.  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 B Complex Vitamins (B COMPLEX PO) Take 1 tablet by mouth daily.    Marland Kitchen co-enzyme Q-10 30 MG capsule Take 30 mg by mouth 3 (three)  times daily.    Marland Kitchen incobotulinumtoxinA (XEOMIN) 100 units SOLR injection Inject 600 Units into the muscle every 3 (three) months. 6 each 3  . Multiple  Vitamins-Minerals (ONE DAILY MULTIVITAMIN MEN PO) Take 1 tablet by mouth daily.    . tadalafil (CIALIS) 20 MG tablet Take 1 tablet (20 mg total) by mouth every other day as needed for erectile dysfunction. 10 tablet 11   No current facility-administered medications for this visit.    PAST MEDICAL HISTORY: Past Medical History:  Diagnosis Date  . Chicken pox     PAST SURGICAL HISTORY: Past Surgical History:  Procedure Laterality Date  . LUMBAR LAMINECTOMY/DECOMPRESSION MICRODISCECTOMY N/A 08/01/2017   Procedure: Thoracic nine, ten, eleven, twelve laminectomy;  Surgeon: Eustace Moore, MD;  Location: [facility] [facility];  Service: Neurosurgery;  Laterality: N/A;  . none    . THORACIC DISCECTOMY N/A 12/27/2017   Procedure: Laminectomy and Foraminotomy - Thoracic one - Thoracic three;  Surgeon: Eustace Moore, MD;  Location: [facility];  Service: Neurosurgery;  Laterality: N/A;  . WISDOM TOOTH EXTRACTION     bilateral lower.    FAMILY HISTORY: Family History  Problem Relation Age of Onset  . Hypertension Mother   . Diabetes Mother   . Hypertension Father   . Lung cancer Father        smoker  . Hypertension Brother   . Hypertension Maternal Grandmother   . Lung cancer Maternal Grandmother        non smoker  . Hypertension Paternal Grandmother     SOCIAL HISTORY: Social History   Socioeconomic History  . Marital status: Married    Spouse name: Not on file  . Number of children: Not on file  . Years of education: Not on file  . Highest education level: Not on file  Occupational History  . Not on file  Tobacco Use  . Smoking status: Never Smoker  . Smokeless tobacco: Never Used  Substance and Sexual Activity  . Alcohol use: Yes    Comment: occasional on weekend  . Drug use: No  . Sexual activity: Yes  Other Topics Concern  . Not on file  Social History Narrative   Married 2018. Wife pregnant with first child- daughter.    Wife works IT with [employer].       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    Social Determinants of Health   Financial Resource Strain:   . Difficulty of Paying Living Expenses:   Food Insecurity:   . Worried About Charity fundraiser in the Last Year:   . Arboriculturist in the Last Year:   Transportation Needs:   . Film/video editor (Medical):   Marland Kitchen Lack of Transportation (Non-Medical):   Physical Activity:   . Days of Exercise per Week:   . Minutes of Exercise per Session:   Stress:   . Feeling of Stress :   Social Connections:   . Frequency of Communication with Friends and Family:   . Frequency of Social Gatherings with Friends and Family:   . Attends Religious Services:   . Active Member of Clubs or Organizations:   . Attends Archivist Meetings:   Marland Kitchen Marital Status:   Intimate Partner Violence:   . Fear of Current or  Ex-Partner:   . Emotionally Abused:   Marland Kitchen Physically Abused:   . Sexually Abused:      PHYSICAL EXAM   Vitals:   07/29/19 1417  BP: 125/80  Pulse: 91  Temp: 97.9 F (36.6 C)  Weight: 258 lb 8 oz (117.3 kg)  Height: 6' (1.829 m)    Not recorded      Body mass index is 35.06 kg/m.  PHYSICAL EXAMNIATION:  MOTOR: Bilateral upper extremities were normal, mild left leg spasticity, there is no significant muscle weakness  GAIT/STANCE: He can get up from seated position arms crossed, mild stiffness on the left side  ASSESSMENT AND PLAN  Scott Lane is a 34 y.o. male   Thoracic myelopathy with residue spastic paraplegia   Left worse than right, first electrical stimulation guided xeomin injection for spastic left lower extremity on December 02, 2017,  We used Xeomin 600 units daily under electrical stimulation   Left semimembranosus 100 units Left semitendinosus 100   units Left rectus femoris 75 units Left vastus lateralis 50   units Left vastus medialis 75 units  Left adductor longus 100 Left adductor magnus 100   Levert Feinstein, M.D. Ph.D.  [facility] [address], [address] [place], [place] [address] Ph: [phone] Fax: [phone]

## 2019-10-22 ENCOUNTER — Encounter: Payer: Self-pay | Admitting: Family Medicine

## 2019-10-22 ENCOUNTER — Telehealth (INDEPENDENT_AMBULATORY_CARE_PROVIDER_SITE_OTHER): Payer: 59 | Admitting: Family Medicine

## 2019-10-22 DIAGNOSIS — R0982 Postnasal drip: Secondary | ICD-10-CM | POA: Diagnosis not present

## 2019-10-22 DIAGNOSIS — R0981 Nasal congestion: Secondary | ICD-10-CM | POA: Diagnosis not present

## 2019-10-22 NOTE — Progress Notes (Signed)
Virtual Visit via Video Note  I connected with Scott Lane  on 10/22/19 at  1:20 PM EDT by a video enabled telemedicine application and verified that I am speaking with the correct person using two identifiers.  Location patient: home, [place] Location provider:work or home office Persons participating in the virtual visit: patient, provider  I discussed the limitations of evaluation and management by telemedicine and the availability of in person appointments. The patient expressed understanding and agreed to proceed.   HPI:  Acute visit for sinus issues: -started 3 days -symptoms include sinus congestion, nasal stuffiness, PND, sneezing -denies cough, fevers, sore throat, sinus pain, body aches, NVD, loss of taste, sick contacts -no history of seasonal allergies -Fully vaccinated for COVID19 w/ pfizer  ROS: See pertinent positives and negatives per HPI.  Past Medical History:  Diagnosis Date  . Chicken pox     Past Surgical History:  Procedure Laterality Date  . LUMBAR LAMINECTOMY/DECOMPRESSION MICRODISCECTOMY N/A 08/01/2017   Procedure: Thoracic nine, ten, eleven, twelve laminectomy;  Surgeon: Tia Alert, MD;  Location: [facility] OR;  Service: Neurosurgery;  Laterality: N/A;  . none    . THORACIC DISCECTOMY N/A 12/27/2017   Procedure: Laminectomy and Foraminotomy - Thoracic one - Thoracic three;  Surgeon: Tia Alert, MD;  Location: [facility];  Service: Neurosurgery;  Laterality: N/A;  . WISDOM TOOTH EXTRACTION     bilateral lower.    Family History  Problem Relation Age of Onset  . Hypertension Mother   . Diabetes Mother   . Hypertension Father   . Lung cancer Father        smoker  . Hypertension Brother   . Hypertension Maternal Grandmother   . Lung cancer Maternal Grandmother        non smoker  . Hypertension Paternal Grandmother     SOCIAL HX: see hpi   Current Outpatient Medications:  .  B Complex Vitamins (B COMPLEX PO), Take 1 tablet by mouth daily., Disp: , Rfl:  .   co-enzyme Q-10 30 MG capsule, Take 30 mg by mouth 3 (three) times daily., Disp: , Rfl:  .  incobotulinumtoxinA (XEOMIN) 100 units SOLR injection, Inject 600 Units into the muscle every 3 (three) months., Disp: 6 each, Rfl: 3 .  Multiple Vitamins-Minerals (ONE DAILY MULTIVITAMIN MEN PO), Take 1 tablet by mouth daily., Disp: , Rfl:  .  tadalafil (CIALIS) 20 MG tablet, Take 1 tablet (20 mg total) by mouth every other day as needed for erectile dysfunction., Disp: 10 tablet, Rfl: 11  Current Facility-Administered Medications:  .  incobotulinumtoxinA (XEOMIN) 100 units injection 600 Units, 600 Units, Intramuscular, Q90 days, Levert Feinstein, MD, 600 Units at 07/29/19 1543  EXAM:  VITALS per patient if applicable:  GENERAL: alert, oriented, appears well and in no acute distress  HEENT: atraumatic, conjunttiva clear, no obvious abnormalities on inspection of external nose and ears  NECK: normal movements of the head and neck  LUNGS: on inspection no signs of respiratory distress, breathing rate appears normal, no obvious gross SOB, gasping or wheezing  CV: no obvious cyanosis  MS: moves all visible extremities without noticeable abnormality  PSYCH/NEURO: pleasant and cooperative, no obvious depression or anxiety, speech and thought processing grossly intact  ASSESSMENT AND PLAN:  Discussed the following assessment and plan:  Nasal congestion  PND (post-nasal drip)  -we discussed possible serious and likely etiologies, options for evaluation and workup, limitations of telemedicine visit vs in person visit, treatment, treatment risks and precautions. Pt prefers to  treat via telemedicine empirically rather then risking or undertaking an in person visit at this moment. Suspect VURI vs other. Opted for nasal saline, 3 day course afrin and discussed otc options for cough and cold. Discussed typical course and return precautions. Discussed that while not 100% impossible, COVID is leess likely given  fully vaccinated. Discussed testing and likely efficacy of vaccine. Patient agrees to seek prompt in person care if worsening, new symptoms arise, or if is not improving with treatment.   I discussed the assessment and treatment plan with the patient. The patient was provided an opportunity to ask questions and all were answered. The patient agreed with the plan and demonstrated an understanding of the instructions.   The patient was advised to call back or seek an in-person evaluation if the symptoms worsen or if the condition fails to improve as anticipated.   Terressa Koyanagi, DO

## 2019-10-28 ENCOUNTER — Ambulatory Visit: Payer: 59 | Admitting: Neurology

## 2019-11-03 ENCOUNTER — Other Ambulatory Visit (HOSPITAL_COMMUNITY): Payer: Self-pay | Admitting: Neurological Surgery

## 2019-11-03 ENCOUNTER — Other Ambulatory Visit: Payer: Self-pay | Admitting: Neurological Surgery

## 2019-11-03 DIAGNOSIS — M4804 Spinal stenosis, thoracic region: Secondary | ICD-10-CM

## 2019-11-16 ENCOUNTER — Other Ambulatory Visit: Payer: Self-pay

## 2019-11-16 ENCOUNTER — Ambulatory Visit (HOSPITAL_COMMUNITY)
Admission: RE | Admit: 2019-11-16 | Discharge: 2019-11-16 | Disposition: A | Discharge status: Home / Self Care | Payer: 59 | Source: Ambulatory Visit | Attending: Neurological Surgery | Admitting: Neurological Surgery

## 2019-11-16 DIAGNOSIS — G9589 Other specified diseases of spinal cord: Secondary | ICD-10-CM | POA: Diagnosis not present

## 2019-11-16 DIAGNOSIS — M4804 Spinal stenosis, thoracic region: Secondary | ICD-10-CM | POA: Diagnosis not present

## 2019-11-16 DIAGNOSIS — Q7649 Other congenital malformations of spine, not associated with scoliosis: Secondary | ICD-10-CM | POA: Diagnosis not present

## 2019-11-16 DIAGNOSIS — M47814 Spondylosis without myelopathy or radiculopathy, thoracic region: Secondary | ICD-10-CM | POA: Diagnosis not present

## 2019-12-01 DIAGNOSIS — M4804 Spinal stenosis, thoracic region: Secondary | ICD-10-CM | POA: Diagnosis not present

## 2020-01-11 ENCOUNTER — Encounter: Payer: Self-pay | Admitting: Family Medicine

## 2020-01-11 ENCOUNTER — Other Ambulatory Visit: Payer: Self-pay

## 2020-01-11 DIAGNOSIS — R82998 Other abnormal findings in urine: Secondary | ICD-10-CM

## 2020-01-15 ENCOUNTER — Other Ambulatory Visit: Payer: Self-pay

## 2020-01-15 ENCOUNTER — Other Ambulatory Visit: Payer: 59

## 2020-01-15 DIAGNOSIS — R82998 Other abnormal findings in urine: Secondary | ICD-10-CM

## 2020-05-10 DIAGNOSIS — M4804 Spinal stenosis, thoracic region: Secondary | ICD-10-CM | POA: Diagnosis not present

## 2020-05-19 ENCOUNTER — Other Ambulatory Visit: Payer: Self-pay

## 2020-05-19 DIAGNOSIS — R82998 Other abnormal findings in urine: Secondary | ICD-10-CM

## 2020-05-20 ENCOUNTER — Other Ambulatory Visit (INDEPENDENT_AMBULATORY_CARE_PROVIDER_SITE_OTHER): Payer: 59

## 2020-05-20 ENCOUNTER — Other Ambulatory Visit: Payer: Self-pay

## 2020-05-20 ENCOUNTER — Other Ambulatory Visit: Payer: Self-pay | Admitting: *Deleted

## 2020-05-20 ENCOUNTER — Encounter: Payer: Self-pay | Admitting: Family Medicine

## 2020-05-20 DIAGNOSIS — R82998 Other abnormal findings in urine: Secondary | ICD-10-CM

## 2020-05-20 DIAGNOSIS — Z0000 Encounter for general adult medical examination without abnormal findings: Secondary | ICD-10-CM

## 2020-05-20 DIAGNOSIS — Z13220 Encounter for screening for lipoid disorders: Secondary | ICD-10-CM

## 2020-05-20 LAB — POC URINALSYSI DIPSTICK (AUTOMATED)
Bilirubin, UA: NEGATIVE
Blood, UA: NEGATIVE
Glucose, UA: NEGATIVE
Ketones, UA: NEGATIVE
Leukocytes, UA: NEGATIVE
Nitrite, UA: NEGATIVE
Protein, UA: NEGATIVE
Spec Grav, UA: >=1.03 — AB (ref 1.010–1.025)
Urobilinogen, UA: 0.2 E.U./dL
pH, UA: 5.5 (ref 5.0–8.0)

## 2020-06-27 ENCOUNTER — Other Ambulatory Visit: Payer: Self-pay

## 2020-06-27 ENCOUNTER — Other Ambulatory Visit (INDEPENDENT_AMBULATORY_CARE_PROVIDER_SITE_OTHER): Payer: 59

## 2020-06-27 DIAGNOSIS — Z0000 Encounter for general adult medical examination without abnormal findings: Secondary | ICD-10-CM

## 2020-06-27 DIAGNOSIS — E7849 Other hyperlipidemia: Secondary | ICD-10-CM | POA: Diagnosis not present

## 2020-06-27 DIAGNOSIS — Z13220 Encounter for screening for lipoid disorders: Secondary | ICD-10-CM

## 2020-06-27 LAB — LIPID PANEL
Cholesterol: 174 mg/dL (ref 0–200)
HDL: 49 mg/dL (ref >39.00)
LDL Cholesterol: 106 mg/dL — ABNORMAL HIGH (ref 0–99)
NonHDL: 125.31
Total CHOL/HDL Ratio: 4
Triglycerides: 95 mg/dL (ref 0.0–149.0)
VLDL: 19 mg/dL (ref 0.0–40.0)

## 2020-06-27 LAB — CBC WITH DIFFERENTIAL/PLATELET
Basophils Absolute: 0 10*3/uL (ref 0.0–0.1)
Basophils Relative: 1.1 % (ref 0.0–3.0)
Eosinophils Absolute: 0.1 10*3/uL (ref 0.0–0.7)
Eosinophils Relative: 2.5 % (ref 0.0–5.0)
HCT: 44 % (ref 39.0–52.0)
Hemoglobin: 15.2 g/dL (ref 13.0–17.0)
Lymphocytes Relative: 24.7 % (ref 12.0–46.0)
Lymphs Abs: 1 10*3/uL (ref 0.7–4.0)
MCHC: 34.6 g/dL (ref 30.0–36.0)
MCV: 86.6 fl (ref 78.0–100.0)
Monocytes Absolute: 0.5 10*3/uL (ref 0.1–1.0)
Monocytes Relative: 13.1 % — ABNORMAL HIGH (ref 3.0–12.0)
Neutro Abs: 2.4 10*3/uL (ref 1.4–7.7)
Neutrophils Relative %: 58.6 % (ref 43.0–77.0)
Platelets: 227 10*3/uL (ref 150.0–400.0)
RBC: 5.08 Mil/uL (ref 4.22–5.81)
RDW: 13 % (ref 11.5–15.5)
WBC: 4.1 10*3/uL (ref 4.0–10.5)

## 2020-06-27 LAB — COMPREHENSIVE METABOLIC PANEL
ALT: 26 U/L (ref 0–53)
AST: 30 U/L (ref 0–37)
Albumin: 4.2 g/dL (ref 3.5–5.2)
Alkaline Phosphatase: 53 U/L (ref 39–117)
BUN: 12 mg/dL (ref 6–23)
CO2: 30 mEq/L (ref 19–32)
Calcium: 9.1 mg/dL (ref 8.4–10.5)
Chloride: 102 mEq/L (ref 96–112)
Creatinine, Ser: 1.15 mg/dL (ref 0.40–1.50)
GFR: 82.5 mL/min (ref >60.00)
Glucose, Bld: 101 mg/dL — ABNORMAL HIGH (ref 70–99)
Potassium: 4 mEq/L (ref 3.5–5.1)
Sodium: 139 mEq/L (ref 135–145)
Total Bilirubin: 0.5 mg/dL (ref 0.2–1.2)
Total Protein: 7.3 g/dL (ref 6.0–8.3)

## 2020-06-28 NOTE — Patient Instructions (Addendum)
Health Maintenance Due  Topic Date Due  . Hepatitis C Screening- can do next bloodwork- you are low risk Never done   [place] job on exercise and slow steady weight loss- keep up good work. Blood sugar was on bottom end of prediabetes range so this will be important  Recommended follow up: Return in about 1 year (around 06/30/2021) for physical or sooner if needed.

## 2020-06-28 NOTE — Progress Notes (Signed)
Phone: [phone]    Subjective:  Patient presents today for their annual physical. Chief complaint-noted.   See problem oriented charting- ROS- full  review of systems was completed and negative  except for: some left sided abdominal cramping  The following were reviewed and entered/updated in epic: Past Medical History:  Diagnosis Date  . Chicken pox    Patient Active Problem List   Diagnosis Date Noted  . Quadriplegia, unspecified (HCC) 11/07/2017    Priority: High  . Neuropathic pain     Priority: Medium  . Drug induced constipation     Priority: Medium  . Myelopathy (HCC) 08/01/2017    Priority: Medium  . Thoracic spinal stenosis 07/31/2017    Priority: Medium  . S/P lumbar laminectomy 12/27/2017    Priority: Low  . Leukocytosis     Priority: Low  . Urinary retention     Priority: Low  . Class 1 obesity due to excess calories with serious comorbidity and body mass index (BMI) of 33.0 to 33.9 in adult     Priority: Low   Past Surgical History:  Procedure Laterality Date  . LUMBAR LAMINECTOMY/DECOMPRESSION MICRODISCECTOMY N/A 08/01/2017   Procedure: Thoracic nine, ten, eleven, twelve laminectomy;  Surgeon: Tia Alert, MD;  Location: [facility] OR;  Service: Neurosurgery;  Laterality: N/A;  . none    . THORACIC DISCECTOMY N/A 12/27/2017   Procedure: Laminectomy and Foraminotomy - Thoracic one - Thoracic three;  Surgeon: Tia Alert, MD;  Location: [facility] OR;  Service: Neurosurgery;  Laterality: N/A;  . WISDOM TOOTH EXTRACTION     bilateral lower.    Family History  Problem Relation Age of Onset  . Hypertension Mother   . Diabetes Mother   . Hypertension Father   . Lung cancer Father        smoker  . Hypertension Brother   . Hypertension Maternal Grandmother   . Lung cancer Maternal Grandmother        non smoker  . Hypertension Paternal Grandmother     Medications- reviewed and updated Current Outpatient Medications  Medication Sig Dispense Refill  . B  Complex Vitamins (B COMPLEX PO) Take 1 tablet by mouth daily.    . Multiple Vitamins-Minerals (ONE DAILY MULTIVITAMIN MEN PO) Take 1 tablet by mouth daily.    Marland Kitchen incobotulinumtoxinA (XEOMIN) 100 units SOLR injection Inject 600 Units into the muscle every 3 (three) months. (Patient not taking: Reported on 06/30/2020) 6 each 3  . tadalafil (CIALIS) 20 MG tablet Take 1 tablet (20 mg total) by mouth every other day as needed for erectile dysfunction. 10 tablet 11   Current Facility-Administered Medications  Medication Dose Route Frequency Provider Last Rate Last Admin  . incobotulinumtoxinA (XEOMIN) 100 units injection 600 Units  600 Units Intramuscular Q90 days Levert Feinstein, MD   600 Units at 07/29/19 1543    Allergies-reviewed and updated No Known Allergies  Social History   Social History Narrative   Married 2018. Wife pregnant with first child- daughter.    Wife works IT with [employer].       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       Objective:  BP 132/82   Pulse 97   Temp 98.2 F (36.8 C) (Temporal)   Ht 6' (1.829 m)   Wt 257  lb 12.8 oz (116.9 kg)   SpO2 95%   BMI 34.96 kg/m  Gen: NAD, resting comfortably HEENT: Mucous membranes are moist. Oropharynx normal Neck: no thyromegaly CV: RRR no murmurs rubs or gallops Lungs: CTAB no crackles, wheeze, rhonchi Abdomen: soft/nontender/nondistended/normal bowel sounds. No rebound or guarding.  Ext: no edema Skin: warm, dry Neuro: grossly normal, moves all extremities, PERRLA     Assessment and Plan:  35 y.o. male presenting for annual physical.  Health Maintenance counseling: 1. Anticipatory guidance: Patient counseled regarding regular dental exams -q6 months, eye exams -  No vision issues,  avoiding smoking and second hand smoke , limiting alcohol to 2 beverages per day- perhaps  2 weekends a month - max 2-4 a week.   2. Risk factor reduction:  Advised patient of need for regular exercise and diet rich and fruits and vegetables to reduce risk of heart attack and stroke. Exercise- peloton bike 6 out of 7 days for 45 minutes. Diet-doing meal prep and weighing foods out- having some nice gradual weight loss while maintaining muscle mass.  Patient was working with a dietitian last year.  Peak weight had been 260 or 262 Wt Readings from Last 3 Encounters:  06/30/20 257 lb 12.8 oz (116.9 kg)  07/29/19 258 lb 8 oz (117.3 kg)  03/26/19 260 lb 8 oz (118.2 kg)  3. Immunizations/screenings/ancillary studies-covid-19 vaccination-he also got it from daughter likely omicron so has ideal protection, declines flu shot yearly, hepatitis C screening did not get added to labs-consider next year  Immunization History  Administered Date(s) Administered  . PFIZER(Purple Top)SARS-COV-2 Vaccination 05/23/2019, 06/17/2019, 04/22/2020  . Tdap 08/23/2017  4. Prostate cancer screening-   No family history, start at age 12  5. Colon cancer screening -  no family history, start at age 81  6. Skin cancer screening/prevention-low risk due to melanin content.advised regular sunscreen use. Denies worrisome, changing, or new skin lesions.  7. Testicular cancer screening- advised monthly self exams  8. STD screening- patient opts out as monogamous 9.  Never smoker-   Status of chronic or acute concerns   #Social update-daughter now 56 years old, wife is doing well. Work going well   # left sided cramping in abdomen- usually on the left side. Happens once a week or every other week. Lasts a few minutes then can move slightly then stop- may happen with sneezing. Better with core workouts.   #Thoracic myelopathy with residual spastic quadriplegia (previously listed as paraplegia but neurology updated paraplegia) --left leg still biggest problem area for him-continues have trouble with stairs.  Was told  would likely reach peak recovery on May 2021- but he has noted some continued improvement. He does not feel much weakness- more of tightness in the left leg. His goal is to start back jogging.   #hyperlipidemia S: Medication:None Lab Results  Component Value Date   CHOL 174 06/27/2020   HDL 49.00 06/27/2020   LDLCALC 106 (H) 06/27/2020   TRIG 95.0 06/27/2020   CHOLHDL 4 06/27/2020   A/P: Mild hyperlipidemia noted but certainly not in range where he needs medicine-continue to work on healthy eating/regular exercise   #ED-Cialis 5 mg helpful from urology in the past.  We had sent in 20 mg tablets to use perhaps half tablet last year. Will refill  Recommended follow up: Return in about 1 year (around 06/30/2021) for physical or sooner if needed.  Lab/Order associations:Has already had labs-CBC reassuring, CMP with blood sugar just a hair high at 101  with normalization of liver function   ICD-10-CM   1. Preventative health care  Z00.00   2. Quadriplegia, unspecified (HCC)  G82.50     Meds ordered this encounter  Medications  . tadalafil (CIALIS) 20 MG tablet    Sig: Take 1 tablet (20 mg total) by mouth every other day as needed for erectile dysfunction.    Dispense:  10 tablet    Refill:  11    Return precautions advised.   Tana Conch, MD

## 2020-06-30 ENCOUNTER — Other Ambulatory Visit: Payer: Self-pay

## 2020-06-30 ENCOUNTER — Ambulatory Visit (INDEPENDENT_AMBULATORY_CARE_PROVIDER_SITE_OTHER): Payer: 59 | Admitting: Family Medicine

## 2020-06-30 ENCOUNTER — Encounter: Payer: Self-pay | Admitting: Family Medicine

## 2020-06-30 VITALS — BP 132/82 | HR 97 | Temp 98.2°F | Ht 72.0 in | Wt 257.8 lb

## 2020-06-30 DIAGNOSIS — Z0000 Encounter for general adult medical examination without abnormal findings: Secondary | ICD-10-CM

## 2020-06-30 DIAGNOSIS — G8250 Quadriplegia, unspecified: Secondary | ICD-10-CM | POA: Diagnosis not present

## 2020-06-30 MED ORDER — TADALAFIL 20 MG PO TABS: 20.0000 mg | ORAL_TABLET | ORAL | 11 refills | Status: DC | PRN

## 2020-07-11 ENCOUNTER — Other Ambulatory Visit: Payer: Self-pay | Admitting: Family Medicine

## 2020-10-20 ENCOUNTER — Other Ambulatory Visit (HOSPITAL_COMMUNITY): Payer: Self-pay | Admitting: Neurological Surgery

## 2020-10-20 DIAGNOSIS — M4804 Spinal stenosis, thoracic region: Secondary | ICD-10-CM

## 2020-11-09 ENCOUNTER — Other Ambulatory Visit: Payer: Self-pay

## 2020-11-09 ENCOUNTER — Ambulatory Visit (HOSPITAL_COMMUNITY)
Admission: RE | Admit: 2020-11-09 | Discharge: 2020-11-09 | Disposition: A | Discharge status: Home / Self Care | Payer: 59 | Source: Ambulatory Visit | Attending: Neurological Surgery | Admitting: Neurological Surgery

## 2020-11-09 DIAGNOSIS — M961 Postlaminectomy syndrome, not elsewhere classified: Secondary | ICD-10-CM | POA: Diagnosis not present

## 2020-11-09 DIAGNOSIS — M5124 Other intervertebral disc displacement, thoracic region: Secondary | ICD-10-CM | POA: Diagnosis not present

## 2020-11-09 DIAGNOSIS — M4804 Spinal stenosis, thoracic region: Secondary | ICD-10-CM | POA: Diagnosis not present

## 2020-11-09 DIAGNOSIS — G9589 Other specified diseases of spinal cord: Secondary | ICD-10-CM | POA: Diagnosis not present

## 2020-11-17 DIAGNOSIS — M4804 Spinal stenosis, thoracic region: Secondary | ICD-10-CM | POA: Diagnosis not present

## 2020-11-17 DIAGNOSIS — Z6835 Body mass index (BMI) 35.0-35.9, adult: Secondary | ICD-10-CM | POA: Diagnosis not present

## 2020-11-17 DIAGNOSIS — R030 Elevated blood-pressure reading, without diagnosis of hypertension: Secondary | ICD-10-CM | POA: Diagnosis not present

## 2020-12-01 ENCOUNTER — Telehealth: Payer: 59 | Admitting: Family Medicine

## 2020-12-01 ENCOUNTER — Telehealth (INDEPENDENT_AMBULATORY_CARE_PROVIDER_SITE_OTHER): Payer: 59 | Admitting: Family Medicine

## 2020-12-01 ENCOUNTER — Encounter: Payer: Self-pay | Admitting: Family Medicine

## 2020-12-01 VITALS — Wt 254.0 lb

## 2020-12-01 DIAGNOSIS — R0981 Nasal congestion: Secondary | ICD-10-CM | POA: Diagnosis not present

## 2020-12-01 NOTE — Progress Notes (Signed)
Virtual Visit via Video Note  I connected with Scott Lane  on 12/01/20 at  1:20 PM EDT by a video enabled telemedicine application and verified that I am speaking with the correct person using two identifiers.  Location patient: home, [place] Location provider:work or home office Persons participating in the virtual visit: patient, provider  I discussed the limitations of evaluation and management by telemedicine and the availability of in person appointments. The patient expressed understanding and agreed to proceed.   HPI:  Acute telemedicine visit for : -Onset: 2 weeks ago -Symptoms include: started out with a "head cold" and did have some sinus discomfort at the time, now improved but still has some mostly clear sinus congestion, PND, mild cough at times -Denies: fevers, malaise, CP, SOB, NVD, sinus pain or pressure -Has tried:nothing -Pertinent past medical history: seasonal allergies -Pertinent medication allergies: No Known Allergies -COVID-19 vaccine status: vaccinated x2 and had one booster  ROS: See pertinent positives and negatives per HPI.  Past Medical History:  Diagnosis Date   Chicken pox     Past Surgical History:  Procedure Laterality Date   LUMBAR LAMINECTOMY/DECOMPRESSION MICRODISCECTOMY N/A 08/01/2017   Procedure: Thoracic nine, ten, eleven, twelve laminectomy;  Surgeon: Tia Alert, MD;  Location: [facility];  Service: Neurosurgery;  Laterality: N/A;   none     THORACIC DISCECTOMY N/A 12/27/2017   Procedure: Laminectomy and Foraminotomy - Thoracic one - Thoracic three;  Surgeon: Tia Alert, MD;  Location: [facility] OR;  Service: Neurosurgery;  Laterality: N/A;   WISDOM TOOTH EXTRACTION     bilateral lower.     Current Outpatient Medications:    B Complex Vitamins (B COMPLEX PO), Take 1 tablet by mouth daily., Disp: , Rfl:    Multiple Vitamins-Minerals (ONE DAILY MULTIVITAMIN MEN PO), Take 1 tablet by mouth daily., Disp: , Rfl:    tadalafil (CIALIS) 20 MG tablet, TAKE ONE  TABLET BY MOUTH DAILY EVERY OTHER DAY AS NEEDED FOR ERECTILE DYSFUNCTION, Disp: 10 tablet, Rfl: 11  EXAM:  VITALS per patient if applicable:  GENERAL: alert, oriented, appears well and in no acute distress  HEENT: atraumatic, conjunttiva clear, no obvious abnormalities on inspection of external nose and ears  NECK: normal movements of the head and neck  LUNGS: on inspection no signs of respiratory distress, breathing rate appears normal, no obvious gross SOB, gasping or wheezing  CV: no obvious cyanosis  MS: moves all visible extremities without noticeable abnormality  PSYCH/NEURO: pleasant and cooperative, no obvious depression or anxiety, speech and thought processing grossly intact  ASSESSMENT AND PLAN:  Discussed the following assessment and plan:  Nasal sinus congestion  -we discussed possible serious and likely etiologies, options for evaluation and workup, limitations of telemedicine visit vs in person visit, treatment, treatment risks and precautions. Pt is agreeable to treatment via telemedicine at this moment. Query post viral symptoms, allergic rhinitis vs other. Discussed possibility of sinusitis -but no sinus discomfort, not much thick mucus and not fevers, so seems less likely bacterial. Opted for trial nasal saline, restarting an allergy pill such as allegra or zyrtec and flonase 2 sprays each nostril daily for a few weeks.  Advised to seek prompt video visit follow up with PCP office or [facility], or in person care if worsening, new symptoms arise, or if is not improving with treatment. Discussed options for inperson care if PCP office not available. Did let this patient know that I only do telemedicine on Tuesdays and Thursdays for [place]. Advised to schedule  follow up visit with PCP or [facility] if any further questions or concerns to avoid delays in care.   I discussed the assessment and treatment plan with the patient. The patient was provided an opportunity to ask questions  and all were answered. The patient agreed with the plan and demonstrated an understanding of the instructions.     Terressa Koyanagi, DO

## 2020-12-01 NOTE — Patient Instructions (Addendum)
-  Nasal saline twice daily  -Start Allegra once daily for 2 weeks  -Flonase 2 sprays each nostril daily for 2 weeks  I hope you are feeling better soon!  Seek in person care promptly if your symptoms worsen, new concerns arise or you are not improving with treatment.  It was nice to meet you today. I help [facility] out with telemedicine visits on Tuesdays and Thursdays and am available for visits on those days. If you have any concerns or questions following this visit please schedule a follow up visit with your Primary Care doctor or seek care at a local urgent care clinic to avoid delays in care.

## 2021-03-14 ENCOUNTER — Other Ambulatory Visit (HOSPITAL_COMMUNITY): Payer: Self-pay

## 2021-03-14 ENCOUNTER — Telehealth: Payer: 59 | Admitting: Physician Assistant

## 2021-03-14 DIAGNOSIS — J019 Acute sinusitis, unspecified: Secondary | ICD-10-CM

## 2021-03-14 DIAGNOSIS — B9689 Other specified bacterial agents as the cause of diseases classified elsewhere: Secondary | ICD-10-CM | POA: Diagnosis not present

## 2021-03-14 MED ORDER — DOXYCYCLINE HYCLATE 100 MG PO TABS
100.0000 mg | ORAL_TABLET | Freq: Two times a day (BID) | ORAL | 0 refills | Status: DC
Start: 2021-03-14 — End: 2021-07-05
  Filled 2021-03-14: qty 20, 10d supply, fill #0

## 2021-03-14 NOTE — Progress Notes (Signed)
I have spent 5 minutes in review of e-visit questionnaire, review and updating patient chart, medical decision making and response to patient.   Rein Popov Cody Anselma Herbel, PA-C    

## 2021-03-14 NOTE — Progress Notes (Signed)

## 2021-06-14 NOTE — Progress Notes (Incomplete)
Phone: [phone]    Subjective:  Patient presents today for their annual physical. Chief complaint-noted.   See problem oriented charting- ROS- full  review of systems was completed and negative  except for: ***  The following were reviewed and entered/updated in epic: Past Medical History:  Diagnosis Date   Chicken pox    Patient Active Problem List   Diagnosis Date Noted   S/P lumbar laminectomy 12/27/2017   Quadriplegia, unspecified ([place]) 11/07/2017   Leukocytosis    Neuropathic pain    Drug induced constipation    Urinary retention    Class 1 obesity due to excess calories with serious comorbidity and body mass index (BMI) of 33.0 to 33.9 in adult    Myelopathy ([place]) 08/01/2017   Thoracic spinal stenosis 07/31/2017   Past Surgical History:  Procedure Laterality Date   LUMBAR LAMINECTOMY/DECOMPRESSION MICRODISCECTOMY N/A 08/01/2017   Procedure: Thoracic nine, ten, eleven, twelve laminectomy;  Surgeon: Eustace Moore, MD;  Location: [place];  Service: Neurosurgery;  Laterality: N/A;   none     THORACIC DISCECTOMY N/A 12/27/2017   Procedure: Laminectomy and Foraminotomy - Thoracic one - Thoracic three;  Surgeon: Eustace Moore, MD;  Location: [place];  Service: Neurosurgery;  Laterality: N/A;   WISDOM TOOTH EXTRACTION     bilateral lower.    Family History  Problem Relation Age of Onset   Hypertension Mother    Diabetes Mother    Hypertension Father    Lung cancer Father        smoker   Hypertension Brother    Hypertension Maternal Grandmother    Lung cancer Maternal Grandmother        non smoker   Hypertension Paternal Grandmother     Medications- reviewed and updated Current Outpatient Medications  Medication Sig Dispense Refill   B Complex Vitamins (B COMPLEX PO) Take 1 tablet by mouth daily.     doxycycline (VIBRA-TABS) 100 MG tablet Take 1 tablet (100 mg total) by mouth 2 (two) times daily. 20 tablet 0   Multiple Vitamins-Minerals (ONE DAILY  MULTIVITAMIN MEN PO) Take 1 tablet by mouth daily.     tadalafil (CIALIS) 20 MG tablet TAKE ONE TABLET BY MOUTH DAILY EVERY OTHER DAY AS NEEDED FOR ERECTILE DYSFUNCTION 10 tablet 11   No current facility-administered medications for this visit.    Allergies-reviewed and updated No Known Allergies  Social History   Social History Narrative   Married 2018. Wife pregnant with first child- daughter.    Wife works IT with [employer].       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       Objective:  There were no vitals taken for this visit. Gen: NAD, resting comfortably HEENT: Mucous membranes are moist. Oropharynx normal Neck: no thyromegaly CV: RRR no murmurs rubs or gallops Lungs: CTAB no crackles, wheeze, rhonchi Abdomen: soft/nontender/nondistended/normal bowel sounds. No rebound or guarding.  Ext: no edema Skin: warm, dry Neuro: grossly normal, moves all extremities, PERRLA ***    Assessment and Plan:  36 y.o. male presenting for annual physical.  Health Maintenance counseling: 1. Anticipatory guidance: Patient counseled regarding regular dental exams ***q6 months, eye exams -No vision issues***,  avoiding smoking and second hand smoke*** , limiting alcohol to 2 beverages per day-perhaps 2 weekends a  month - max 2-4 a week.  ***, no illicit drugs***.   2. Risk factor reduction:  Advised patient of need for regular exercise and diet rich and fruits and vegetables to reduce risk of heart attack and stroke.  Exercise-peloton bike 6 out of 7 days for 45 minutes. ***.  Diet/weight management--doing meal prep and weighing foods out- having some nice gradual weight loss while maintaining muscle mass.  Patient was working with a dietitian last year.  Peak weight had been 260 or 262***.  Wt Readings from Last 3 Encounters:  12/01/20 254 lb  (115.2 kg)  06/30/20 257 lb 12.8 oz (116.9 kg)  07/29/19 258 lb 8 oz (117.3 kg)   3. Immunizations/screenings/ancillary studies DISCUSSED:  -COVID booster vaccine #4- *** -Flu vaccine - *** -Hepatitis C Screening - *** Immunization History  Administered Date(s) Administered   PFIZER(Purple Top)SARS-COV-2 Vaccination 05/23/2019, 06/17/2019, 04/22/2020   Tdap 08/23/2017   Health Maintenance Due  Topic Date Due   Hepatitis C Screening  Never done   COVID-19 Vaccine (4 - Booster for Pfizer series) 06/17/2020   INFLUENZA VACCINE  Never done    Family History  Problem Relation Age of Onset   Hypertension Mother    Diabetes Mother    Hypertension Father    Lung cancer Father        smoker   Hypertension Brother    Hypertension Maternal Grandmother    Lung cancer Maternal Grandmother        non smoker   Hypertension Paternal Grandmother    4. Prostate cancer screening- no family history, start at age 40  *** No results found for: PSA 5. Colon cancer screening - no family history, start at age 36  *** 5. Skin cancer screening/prevention- -low risk due to melanin content***advised regular sunscreen use. Denies worrisome, changing, or new skin lesions.  7. Testicular cancer screening- advised monthly self exams *** 8. STD screening- patient opts  out as monogamous *** 9. Smoking associated screening- never smoker- ***  Status of chronic or acute concerns   #Social update-reported on 06/30/20 visit, his daughter was 6 years old around the time and wife was doing well. Also reported work was going well    # left sided cramping in abdomen- usually on the left side. Happened once a week or every other week. Lasted a few minutes then can move slightly then stop- may happen with sneezing. Better with core workouts.    #Thoracic myelopathy with residual spastic quadriplegia (previously listed as paraplegia but neurology updated paraplegia) --left leg still biggest problem area for  him-continued to have trouble with stairs.  Was told would likely reach peak recovery on May 2021- but he had noted some continued improvement. He does not feel much weakness- more of tightness in the left leg. His goal is to start back jogging.    #hyperlipidemia S: Medication:None Lab Results  Component Value Date   CHOL 174 06/27/2020   HDL 49.00 06/27/2020   LDLCALC 106 (H) 06/27/2020   TRIG 95.0 06/27/2020   CHOLHDL 4 06/27/2020   A/P: ***   #ED-Cialis 5 mg helpful from urology in the past.  We had sent in 20 mg tablets to use perhaps half tablet last year.  Recommended follow up: No follow-ups on file. Future Appointments  Date Time Provider [place]  07/05/2021  8:00 AM Marin Olp, MD [facility]    No chief complaint on file.  Lab/Order associations:*** fasting No diagnosis found.  No orders  of the defined types were placed in this encounter.   I,Jada Bradford,acting as a scribe for Garret Reddish, MD.,have documented all relevant documentation on the behalf of Garret Reddish, MD,as directed by  Garret Reddish, MD while in the presence of Garret Reddish, MD.  *** Return precautions advised.   Burnett Corrente

## 2021-07-05 ENCOUNTER — Encounter: Payer: Self-pay | Admitting: Family Medicine

## 2021-07-05 ENCOUNTER — Ambulatory Visit (INDEPENDENT_AMBULATORY_CARE_PROVIDER_SITE_OTHER): Payer: 59 | Admitting: Family Medicine

## 2021-07-05 VITALS — BP 104/72 | HR 88 | Temp 97.2°F | Ht 72.0 in | Wt 264.6 lb

## 2021-07-05 DIAGNOSIS — G8250 Quadriplegia, unspecified: Secondary | ICD-10-CM | POA: Diagnosis not present

## 2021-07-05 DIAGNOSIS — G959 Disease of spinal cord, unspecified: Secondary | ICD-10-CM

## 2021-07-05 DIAGNOSIS — Z1159 Encounter for screening for other viral diseases: Secondary | ICD-10-CM | POA: Diagnosis not present

## 2021-07-05 DIAGNOSIS — Z0000 Encounter for general adult medical examination without abnormal findings: Secondary | ICD-10-CM

## 2021-07-05 DIAGNOSIS — K921 Melena: Secondary | ICD-10-CM | POA: Diagnosis not present

## 2021-07-05 DIAGNOSIS — E782 Mixed hyperlipidemia: Secondary | ICD-10-CM

## 2021-07-05 LAB — CBC WITH DIFFERENTIAL/PLATELET
Basophils Absolute: 0 10*3/uL (ref 0.0–0.1)
Basophils Relative: 1 % (ref 0.0–3.0)
Eosinophils Absolute: 0 10*3/uL (ref 0.0–0.7)
Eosinophils Relative: 1.2 % (ref 0.0–5.0)
HCT: 45.5 % (ref 39.0–52.0)
Hemoglobin: 15.2 g/dL (ref 13.0–17.0)
Lymphocytes Relative: 27.7 % (ref 12.0–46.0)
Lymphs Abs: 1.1 10*3/uL (ref 0.7–4.0)
MCHC: 33.3 g/dL (ref 30.0–36.0)
MCV: 87.6 fl (ref 78.0–100.0)
Monocytes Absolute: 0.6 10*3/uL (ref 0.1–1.0)
Monocytes Relative: 13.6 % — ABNORMAL HIGH (ref 3.0–12.0)
Neutro Abs: 2.3 10*3/uL (ref 1.4–7.7)
Neutrophils Relative %: 56.5 % (ref 43.0–77.0)
Platelets: 222 10*3/uL (ref 150.0–400.0)
RBC: 5.2 Mil/uL (ref 4.22–5.81)
RDW: 12.8 % (ref 11.5–15.5)
WBC: 4.1 10*3/uL (ref 4.0–10.5)

## 2021-07-05 LAB — COMPREHENSIVE METABOLIC PANEL
ALT: 25 U/L (ref 0–53)
AST: 29 U/L (ref 0–37)
Albumin: 4.2 g/dL (ref 3.5–5.2)
Alkaline Phosphatase: 51 U/L (ref 39–117)
BUN: 16 mg/dL (ref 6–23)
CO2: 27 mEq/L (ref 19–32)
Calcium: 9.5 mg/dL (ref 8.4–10.5)
Chloride: 105 mEq/L (ref 96–112)
Creatinine, Ser: 1.14 mg/dL (ref 0.40–1.50)
GFR: 82.77 mL/min (ref >60.00)
Glucose, Bld: 88 mg/dL (ref 70–99)
Potassium: 4.1 mEq/L (ref 3.5–5.1)
Sodium: 139 mEq/L (ref 135–145)
Total Bilirubin: 0.5 mg/dL (ref 0.2–1.2)
Total Protein: 7.7 g/dL (ref 6.0–8.3)

## 2021-07-05 LAB — LIPID PANEL
Cholesterol: 152 mg/dL (ref 0–200)
HDL: 43.5 mg/dL (ref >39.00)
LDL Cholesterol: 81 mg/dL (ref 0–99)
NonHDL: 108.72
Total CHOL/HDL Ratio: 3
Triglycerides: 141 mg/dL (ref 0.0–149.0)
VLDL: 28.2 mg/dL (ref 0.0–40.0)

## 2021-07-05 MED ORDER — TADALAFIL 20 MG PO TABS: ORAL_TABLET | ORAL | 11 refills | Status: DC

## 2021-07-05 NOTE — Patient Instructions (Addendum)
Team please provide stool cards for him- please bring this back ? ?Team give him another goodrx card please ? ? Please stop by lab before you go ?If you have mychart- we will send your results within 3 business days of [place] receiving them.  ?If you do not have mychart- we will call you about results within 5 business days of [place] receiving them.  ?*please also note that you will see labs on mychart as soon as they post. I will later go in and write notes on them- will say "notes from Dr. Yong Channel"  ? ?No changes today ? ?Recommended follow up: Return in about 1 year (around 07/06/2022) for physical or sooner if needed.Schedule b4 you leave. ?

## 2021-07-07 ENCOUNTER — Other Ambulatory Visit (INDEPENDENT_AMBULATORY_CARE_PROVIDER_SITE_OTHER): Payer: 59

## 2021-07-07 DIAGNOSIS — K921 Melena: Secondary | ICD-10-CM

## 2021-07-07 DIAGNOSIS — Z0000 Encounter for general adult medical examination without abnormal findings: Secondary | ICD-10-CM | POA: Diagnosis not present

## 2021-07-07 LAB — FECAL OCCULT BLOOD, IMMUNOCHEMICAL: Fecal Occult Bld: NEGATIVE

## 2021-07-14 ENCOUNTER — Encounter: Payer: Self-pay | Admitting: Family Medicine

## 2021-08-09 ENCOUNTER — Encounter: Payer: Self-pay | Admitting: Family Medicine

## 2022-01-01 ENCOUNTER — Encounter: Payer: Self-pay | Admitting: *Deleted

## 2022-02-08 ENCOUNTER — Encounter: Payer: Self-pay | Admitting: Family Medicine

## 2022-02-08 NOTE — Telephone Encounter (Signed)
See attachment.

## 2022-03-15 ENCOUNTER — Other Ambulatory Visit (HOSPITAL_COMMUNITY): Payer: Self-pay

## 2022-03-15 DIAGNOSIS — Z6834 Body mass index (BMI) 34.0-34.9, adult: Secondary | ICD-10-CM | POA: Diagnosis not present

## 2022-03-15 DIAGNOSIS — M47816 Spondylosis without myelopathy or radiculopathy, lumbar region: Secondary | ICD-10-CM | POA: Diagnosis not present

## 2022-03-15 MED ORDER — MELOXICAM 15 MG PO TABS
15.0000 mg | ORAL_TABLET | Freq: Every day | ORAL | 1 refills | Status: DC
  Filled 2022-03-15: qty 30, 30d supply, fill #0
  Filled 2022-04-11 – 2022-04-16 (×2): qty 45, 45d supply, fill #1
  Filled 2022-06-11: qty 15, 15d supply, fill #2

## 2022-03-15 MED ORDER — METHOCARBAMOL 500 MG PO TABS
500.0000 mg | ORAL_TABLET | Freq: Four times a day (QID) | ORAL | 0 refills | Status: DC
  Filled 2022-03-15: qty 45, 12d supply, fill #0

## 2022-03-22 ENCOUNTER — Encounter: Payer: Self-pay | Admitting: *Deleted

## 2022-04-12 ENCOUNTER — Encounter (HOSPITAL_BASED_OUTPATIENT_CLINIC_OR_DEPARTMENT_OTHER): Payer: Self-pay | Admitting: Physical Therapy

## 2022-04-12 ENCOUNTER — Other Ambulatory Visit: Payer: Self-pay

## 2022-04-12 ENCOUNTER — Ambulatory Visit (HOSPITAL_BASED_OUTPATIENT_CLINIC_OR_DEPARTMENT_OTHER): Payer: 59 | Attending: Student | Admitting: Physical Therapy

## 2022-04-12 DIAGNOSIS — M47816 Spondylosis without myelopathy or radiculopathy, lumbar region: Secondary | ICD-10-CM | POA: Diagnosis present

## 2022-04-12 DIAGNOSIS — M5459 Other low back pain: Secondary | ICD-10-CM | POA: Insufficient documentation

## 2022-04-12 NOTE — Therapy (Incomplete)
OUTPATIENT PHYSICAL THERAPY THORACOLUMBAR EVALUATION   Patient Name: Scott Lane MRN: [id number] DOB:[birth year], 37 y.o., male Today's Date: 04/12/2022  END OF SESSION:  PT End of Session - 04/12/22 1523     Visit Number 1    Authorization Type [employer]    PT Start Time 1519             Past Medical History:  Diagnosis Date   Chicken pox    Past Surgical History:  Procedure Laterality Date   LUMBAR LAMINECTOMY/DECOMPRESSION MICRODISCECTOMY N/A 08/01/2017   Procedure: Thoracic nine, ten, eleven, twelve laminectomy;  Surgeon: Eustace Moore, MD;  Location: [facility];  Service: Neurosurgery;  Laterality: N/A;   none     THORACIC DISCECTOMY N/A 12/27/2017   Procedure: Laminectomy and Foraminotomy - Thoracic one - Thoracic three;  Surgeon: Eustace Moore, MD;  Location: [place];  Service: Neurosurgery;  Laterality: N/A;   WISDOM TOOTH EXTRACTION     bilateral lower.   Patient Active Problem List   Diagnosis Date Noted   S/P lumbar laminectomy 12/27/2017   Quadriplegia, unspecified ([place]) 11/07/2017   Leukocytosis    Neuropathic pain    Drug induced constipation    Urinary retention    Class 1 obesity due to excess calories with serious comorbidity and body mass index (BMI) of 33.0 to 33.9 in adult    Myelopathy ([place]) 08/01/2017   Thoracic spinal stenosis 07/31/2017     REFERRING PROVIDER:  Eleonore Chiquito, NP    REFERRING DIAG: [phone] (ICD-10-CM) - Spondylosis without myelopathy or radiculopathy, lumbar region   Rationale for Evaluation and Treatment: {HABREHAB:27488}  THERAPY DIAG:  No diagnosis found.  ONSET DATE: ***  SUBJECTIVE:                                                                                                                                                                                           SUBJECTIVE STATEMENT: About 4 years ago had an SCI hitting a golf ball. About 2 mo ago I stood the whole day and about 2 ddays later I felt like I  had a pulled muscle in my lower back. On and off to the point where I could barely walk. Just on the left side.      PERTINENT HISTORY:  ***  PAIN:  Are you having pain? {OPRCPAIN:27236}  PRECAUTIONS: {Therapy precautions:24002}  WEIGHT BEARING RESTRICTIONS: {Yes ***/No:24003}  FALLS:  Has patient fallen in last 6 months? {fallsyesno:27318}  LIVING ENVIRONMENT: Lives with: {OPRC lives with:25569::"lives with their family"} Lives in: {Lives in:25570} Stairs: {opstairs:27293} Has following equipment at home: {Assistive devices:23999}  OCCUPATION: mental health  therapist  PLOF: {PLOF:24004}  PATIENT GOALS: continue exercise 5-6 days/week  NEXT MD VISIT:   OBJECTIVE:   DIAGNOSTIC FINDINGS:  MRI thoracic spine 11/09/20:  FINDINGS: Alignment:  Normal   Vertebrae: Negative for fracture or mass. Decompressive laminectomy T1 through T3. Decompressive laminectomy T9 through T11.   Cord: Patchy cord hyperintensity at T9, T10, and T11-T12 similar to the prior study. Remaining cord signal normal.   Paraspinal and other soft tissues: Negative for paraspinous mass, adenopathy, or fluid collection.   Disc levels:   Congenitally small canal. Multilevel spinal stenosis and degenerative change throughout the thoracic spine.   T1-2: Posterior decompression. Bilateral facet hypertrophy. Moderate foraminal stenosis bilaterally   T2-3: Posterior decompression. Bilateral facet hypertrophy. Moderate foraminal stenosis bilaterally.   T3-4: Mild facet degeneration.  Mild foraminal narrowing bilaterally   T4-5: Moderate facet hypertrophy bilaterally. Moderate foraminal narrowing bilaterally.   T5-6: Mild disc bulging. Mild spinal stenosis. Bilateral facet degeneration. Mild foraminal narrowing bilaterally   T6-7: Mild disc bulging and bilateral facet degeneration. Mild foraminal narrowing bilaterally.   T7-8: Mild disc degeneration and disc bulging. Bilateral  facet hypertrophy. Moderate foraminal stenosis bilaterally   T8-9: Bilateral facet degeneration. Mild foraminal narrowing bilaterally   T9-10: Posterior decompression. Bilateral facet hypertrophy. Bilateral foraminal stenosis   T10-11: Posterior decompression. Spinal canal adequate in size. Bilateral facet hypertrophy. Moderate foraminal stenosis bilaterally.   T11-12: Posterior decompression with mild spinal stenosis. Diffuse endplate spurring and bilateral facet hypertrophy. Marked foraminal encroachment bilaterally.   IMPRESSION: 1. Congenital thoracic stenosis. Extensive multilevel disc and facet degeneration throughout the thoracic spine. Degenerative change most prominent the facet joints. Multilevel foraminal encroachment due to spurring. 2. Chronic myelomalacia with cord hyperintensity at T9, T10, and T11-12 similar to the prior study. 3. Overall no change from the prior MRI.    PATIENT SURVEYS:  {rehab surveys:24030}  SCREENING FOR RED FLAGS: Bowel or bladder incontinence: {Yes/No:304960894} Spinal tumors: {Yes/No:304960894} Cauda equina syndrome: {Yes/No:304960894} Compression fracture: {Yes/No:304960894} Abdominal aneurysm: {Yes/No:304960894}  COGNITION: Overall cognitive status: {cognition:24006}     SENSATION: {sensation:27233}  MUSCLE LENGTH: Hamstrings: Right *** deg; Left *** deg Thomas test: Right *** deg; Left *** deg  POSTURE: {posture:25561}  PALPATION: ***  LUMBAR ROM:   AROM eval  Flexion   Extension   Right lateral flexion   Left lateral flexion   Right rotation   Left rotation    (Blank rows = not tested)  LOWER EXTREMITY ROM:     {AROM/PROM:27142}  Right eval Left eval  Hip flexion    Hip extension    Hip abduction    Hip adduction    Hip internal rotation    Hip external rotation    Knee flexion    Knee extension    Ankle dorsiflexion    Ankle plantarflexion    Ankle inversion    Ankle eversion     (Blank rows =  not tested)  LOWER EXTREMITY MMT:    MMT Right eval Left eval  Hip flexion    Hip extension    Hip abduction    Hip adduction    Hip internal rotation    Hip external rotation    Knee flexion    Knee extension    Ankle dorsiflexion    Ankle plantarflexion    Ankle inversion    Ankle eversion     (Blank rows = not tested)  LUMBAR SPECIAL TESTS:  {lumbar special test:25242}  FUNCTIONAL TESTS:  {Functional tests:24029}  GAIT: Distance walked: *** Assistive device  utilized: {Assistive devices:23999} Level of assistance: {Levels of assistance:24026} Comments: ***  TODAY'S TREATMENT:                                                                                                                              DATE: ***    PATIENT EDUCATION:  Education details: *** Person educated: {Person educated:25204} Education method: {Education Method:25205} Education comprehension: {Education Comprehension:25206}  HOME EXERCISE PROGRAM: ***  ASSESSMENT:  CLINICAL IMPRESSION: Patient is a *** y.o. *** who was seen today for physical therapy evaluation and treatment for ***.   OBJECTIVE IMPAIRMENTS: {opptimpairments:25111}.   ACTIVITY LIMITATIONS: {activitylimitations:27494}  PARTICIPATION LIMITATIONS: {participationrestrictions:25113}  PERSONAL FACTORS: {Personal factors:25162} are also affecting patient's functional outcome.   REHAB POTENTIAL: {rehabpotential:25112}  CLINICAL DECISION MAKING: {clinical decision making:25114}  EVALUATION COMPLEXITY: {Evaluation complexity:25115}   GOALS: Goals reviewed with patient? {yes/no:20286}  SHORT TERM GOALS: Target date: ***  *** Baseline: Goal status: {GOALSTATUS:25110}  2.  *** Baseline:  Goal status: {GOALSTATUS:25110}  3.  *** Baseline:  Goal status: {GOALSTATUS:25110}  4.  *** Baseline:  Goal status: {GOALSTATUS:25110}  5.  *** Baseline:  Goal status: {GOALSTATUS:25110}  6.  *** Baseline:  Goal  status: {GOALSTATUS:25110}  LONG TERM GOALS: Target date: ***  *** Baseline:  Goal status: {GOALSTATUS:25110}  2.  *** Baseline:  Goal status: {GOALSTATUS:25110}  3.  *** Baseline:  Goal status: {GOALSTATUS:25110}  4.  *** Baseline:  Goal status: {GOALSTATUS:25110}  5.  *** Baseline:  Goal status: {GOALSTATUS:25110}  6.  *** Baseline:  Goal status: {GOALSTATUS:25110}  PLAN:  PT FREQUENCY: {rehab frequency:25116}  PT DURATION: {rehab duration:25117}  PLANNED INTERVENTIONS: {rehab planned interventions:25118::"Therapeutic exercises","Therapeutic activity","Neuromuscular re-education","Balance training","Gait training","Patient/Family education","Self Care","Joint mobilization"}.  PLAN FOR NEXT SESSION: ***   Selinda Eon, PT 04/12/2022, 3:23 PM

## 2022-04-13 ENCOUNTER — Ambulatory Visit (HOSPITAL_BASED_OUTPATIENT_CLINIC_OR_DEPARTMENT_OTHER): Payer: Commercial Managed Care - PPO | Admitting: Physical Therapy

## 2022-04-13 NOTE — Therapy (Signed)
OUTPATIENT PHYSICAL THERAPY THORACOLUMBAR EVALUATION   Patient Name: Scott Lane MRN: [id number] DOB:[birth year], 37 y.o., male Today's Date: 04/13/2022  END OF SESSION:  PT End of Session - 04/12/22 1523     Visit Number 1    Number of Visits 7    Date for PT Re-Evaluation 05/25/22    Authorization Type [employer]    PT Start Time 1519    PT Stop Time 1600    PT Time Calculation (min) 41 min    Activity Tolerance Patient tolerated treatment well    Behavior During Therapy [facility] for tasks assessed/performed             Past Medical History:  Diagnosis Date   Chicken pox    Past Surgical History:  Procedure Laterality Date   LUMBAR LAMINECTOMY/DECOMPRESSION MICRODISCECTOMY N/A 08/01/2017   Procedure: Thoracic nine, ten, eleven, twelve laminectomy;  Surgeon: Eustace Moore, MD;  Location: [place];  Service: Neurosurgery;  Laterality: N/A;   none     THORACIC DISCECTOMY N/A 12/27/2017   Procedure: Laminectomy and Foraminotomy - Thoracic one - Thoracic three;  Surgeon: Eustace Moore, MD;  Location: [place];  Service: Neurosurgery;  Laterality: N/A;   WISDOM TOOTH EXTRACTION     bilateral lower.   Patient Active Problem List   Diagnosis Date Noted   S/P lumbar laminectomy 12/27/2017   Quadriplegia, unspecified ([place]) 11/07/2017   Leukocytosis    Neuropathic pain    Drug induced constipation    Urinary retention    Class 1 obesity due to excess calories with serious comorbidity and body mass index (BMI) of 33.0 to 33.9 in adult    Myelopathy ([place]) 08/01/2017   Thoracic spinal stenosis 07/31/2017     REFERRING PROVIDER:  Eleonore Chiquito, NP    REFERRING DIAG: [phone] (ICD-10-CM) - Spondylosis without myelopathy or radiculopathy, lumbar region   Rationale for Evaluation and Treatment: Rehabilitation  THERAPY DIAG:  Other low back pain  ONSET DATE: about 2 months ago  SUBJECTIVE:                                                                                                                                                                                            SUBJECTIVE STATEMENT: About 4 years ago had an SCI hitting a golf ball. About 2 mo ago I stood the whole day and about 2 ddays later I felt like I had a pulled muscle in my lower back. On and off to the point where I could barely walk. Just on the left side.      PERTINENT HISTORY:  Thoracic spinal stenosis and  h/o thoraco lumbar surgery  PAIN:  Are you having pain? No  PRECAUTIONS: None  WEIGHT BEARING RESTRICTIONS: No  FALLS:  Has patient fallen in last 6 months? No  LIVING ENVIRONMENT: Lives with: lives with their family  OCCUPATION: mental health therapist  PLOF: Independent  PATIENT GOALS: continue exercise 5-6 days/week   OBJECTIVE:   DIAGNOSTIC FINDINGS:  MRI thoracic spine 11/09/20:  FINDINGS: Alignment:  Normal   Vertebrae: Negative for fracture or mass. Decompressive laminectomy T1 through T3. Decompressive laminectomy T9 through T11.   Cord: Patchy cord hyperintensity at T9, T10, and T11-T12 similar to the prior study. Remaining cord signal normal.   Paraspinal and other soft tissues: Negative for paraspinous mass, adenopathy, or fluid collection.   Disc levels:   Congenitally small canal. Multilevel spinal stenosis and degenerative change throughout the thoracic spine.   T1-2: Posterior decompression. Bilateral facet hypertrophy. Moderate foraminal stenosis bilaterally   T2-3: Posterior decompression. Bilateral facet hypertrophy. Moderate foraminal stenosis bilaterally.   T3-4: Mild facet degeneration.  Mild foraminal narrowing bilaterally   T4-5: Moderate facet hypertrophy bilaterally. Moderate foraminal narrowing bilaterally.   T5-6: Mild disc bulging. Mild spinal stenosis. Bilateral facet degeneration. Mild foraminal narrowing bilaterally   T6-7: Mild disc bulging and bilateral facet degeneration. Mild foraminal narrowing bilaterally.    T7-8: Mild disc degeneration and disc bulging. Bilateral facet hypertrophy. Moderate foraminal stenosis bilaterally   T8-9: Bilateral facet degeneration. Mild foraminal narrowing bilaterally   T9-10: Posterior decompression. Bilateral facet hypertrophy. Bilateral foraminal stenosis   T10-11: Posterior decompression. Spinal canal adequate in size. Bilateral facet hypertrophy. Moderate foraminal stenosis bilaterally.   T11-12: Posterior decompression with mild spinal stenosis. Diffuse endplate spurring and bilateral facet hypertrophy. Marked foraminal encroachment bilaterally.   IMPRESSION: 1. Congenital thoracic stenosis. Extensive multilevel disc and facet degeneration throughout the thoracic spine. Degenerative change most prominent the facet joints. Multilevel foraminal encroachment due to spurring. 2. Chronic myelomalacia with cord hyperintensity at T9, T10, and T11-12 similar to the prior study. 3. Overall no change from the prior MRI.     COGNITION: Overall cognitive status: Within functional limits for tasks assessed     SENSATION: Pt reports some nerve damage that limits sensation  MUSCLE LENGTH: Gross limitation in flexibility  POSTURE: increased thoracic kyphosis  PALPATION: EVAL: Left upper quadrant of Sacrum superior and anterior, tightness in left hip   LUMBAR ROM:   WFL  LOWER EXTREMITY MMT:    EVAL: significant fatigue in hip abductors in sidelying exercises  GAIT: WFL  TODAY'S TREATMENT:                                                                                                                               Treatment                            EVAL:  Sidelying hip circles & arcs Sidelying plank- provided options for addition of  UE an LE strength Seated abdominal engagement- c-sit with upright posture   PATIENT EDUCATION:  Education details: Anatomy of condition, POC, HEP, exercise form/rationale Person educated: Patient Education  method: Explanation, Demonstration, Tactile cues, Verbal cues, and Handouts Education comprehension: verbalized understanding, returned demonstration, verbal cues required, tactile cues required, and needs further education  HOME EXERCISE PROGRAM: [id number]  ASSESSMENT:  CLINICAL IMPRESSION: Patient is a 37 y.o. M who was seen today for physical therapy evaluation and treatment for Lt-sided LBP that has been on and off. Noted rotation of sacrum but pt denied pain with palpation. Core weakness results in increased kyphotic curvature and tension in lower back. Encouraged him to begin focusing on awareness of postural alignment and will progress proper core strengthening program.    OBJECTIVE IMPAIRMENTS: decreased strength, increased muscle spasms, impaired flexibility, improper body mechanics, postural dysfunction, and pain.   ACTIVITY LIMITATIONS: carrying, lifting, bending, standing, squatting, and physical fitness  PARTICIPATION LIMITATIONS:  limited in mobility when pain has been present  PERSONAL FACTORS:  h/o spinal stenosis and surgical intervention  are also affecting patient's functional outcome.   REHAB POTENTIAL: Good  CLINICAL DECISION MAKING: Stable/uncomplicated  EVALUATION COMPLEXITY: Low   GOALS: Goals reviewed with patient? Yes  LONG TERM GOALS: Target date: POC date  Pt able to demonstrate upright posture with abdominal engagement Baseline:  Goal status: INITIAL  2.  Indepdnent with long term core stabilization program Baseline:  Goal status: INITIAL  3.  Pt will not experience onset of back pain Baseline:  Goal status: INITIAL  4.  Independent with long term flexibility program Baseline:  Goal status: INITIAL   PLAN:  PT FREQUENCY: 1x/week  PT DURATION: 6 weeks  PLANNED INTERVENTIONS: Therapeutic exercises, Therapeutic activity, Neuromuscular re-education, Balance training, Gait training, Patient/Family education, Self Care, Joint mobilization,  Stair training, Aquatic Therapy, Dry Needling, Electrical stimulation, Spinal mobilization, Cryotherapy, Moist heat, Taping, Ionotophoresis 4mg /ml Dexamethasone, Manual therapy, and Re-evaluation.  PLAN FOR NEXT SESSION: continue core stability, review lifting techniques   Rayanna Matusik C. Woods Gangemi PT, DPT 04/13/22 9:39 PM

## 2022-04-16 ENCOUNTER — Other Ambulatory Visit (HOSPITAL_COMMUNITY): Payer: Self-pay

## 2022-04-18 ENCOUNTER — Encounter (HOSPITAL_BASED_OUTPATIENT_CLINIC_OR_DEPARTMENT_OTHER): Payer: Self-pay | Admitting: Physical Therapy

## 2022-04-18 ENCOUNTER — Ambulatory Visit (HOSPITAL_BASED_OUTPATIENT_CLINIC_OR_DEPARTMENT_OTHER): Payer: 59 | Admitting: Physical Therapy

## 2022-04-18 DIAGNOSIS — M47816 Spondylosis without myelopathy or radiculopathy, lumbar region: Secondary | ICD-10-CM | POA: Diagnosis not present

## 2022-04-18 DIAGNOSIS — M5459 Other low back pain: Secondary | ICD-10-CM

## 2022-04-18 NOTE — Therapy (Signed)
OUTPATIENT PHYSICAL THERAPY THORACOLUMBAR EVALUATION   Patient Name: Scott Lane MRN: [id number] DOB:1986-02-09, 37 y.o., male Today's Date: 04/18/2022  END OF SESSION:  PT End of Session - 04/18/22 1647     Visit Number 2    Number of Visits 7    Date for PT Re-Evaluation 05/25/22    Authorization Type [employer]    PT Start Time 1647    PT Stop Time 1720    PT Time Calculation (min) 33 min    Activity Tolerance Patient tolerated treatment well    Behavior During Therapy [facility] for tasks assessed/performed             Past Medical History:  Diagnosis Date   Chicken pox    Past Surgical History:  Procedure Laterality Date   LUMBAR LAMINECTOMY/DECOMPRESSION MICRODISCECTOMY N/A 08/01/2017   Procedure: Thoracic nine, ten, eleven, twelve laminectomy;  Surgeon: Tia Alert, MD;  Location: [facility];  Service: Neurosurgery;  Laterality: N/A;   none     THORACIC DISCECTOMY N/A 12/27/2017   Procedure: Laminectomy and Foraminotomy - Thoracic one - Thoracic three;  Surgeon: Tia Alert, MD;  Location: [facility] OR;  Service: Neurosurgery;  Laterality: N/A;   WISDOM TOOTH EXTRACTION     bilateral lower.   Patient Active Problem List   Diagnosis Date Noted   S/P lumbar laminectomy 12/27/2017   Quadriplegia, unspecified ([facility]) 11/07/2017   Leukocytosis    Neuropathic pain    Drug induced constipation    Urinary retention    Class 1 obesity due to excess calories with serious comorbidity and body mass index (BMI) of 33.0 to 33.9 in adult    Myelopathy ([facility]) 08/01/2017   Thoracic spinal stenosis 07/31/2017     REFERRING PROVIDER:  Sherryl Manges, NP    REFERRING DIAG: [phone] (ICD-10-CM) - Spondylosis without myelopathy or radiculopathy, lumbar region   Rationale for Evaluation and Treatment: Rehabilitation  THERAPY DIAG:  Other low back pain  ONSET DATE: about 2 months ago  SUBJECTIVE:                                                                                                                                                                                            SUBJECTIVE STATEMENT: Pt states he did not have extra pain after the last session but does have continued L sided LBP into the QL region.     PERTINENT HISTORY:  Thoracic spinal stenosis and h/o thoraco lumbar surgery  PAIN:  Are you having pain? No  PRECAUTIONS: None  WEIGHT BEARING RESTRICTIONS: No  FALLS:  Has patient fallen in last 6 months? No  LIVING ENVIRONMENT: Lives with: lives with their family  OCCUPATION: mental health therapist  PLOF: Independent  PATIENT GOALS: continue exercise 5-6 days/week   OBJECTIVE:   DIAGNOSTIC FINDINGS:  MRI thoracic spine 11/09/20:  FINDINGS: Alignment:  Normal   Vertebrae: Negative for fracture or mass. Decompressive laminectomy T1 through T3. Decompressive laminectomy T9 through T11.   Cord: Patchy cord hyperintensity at T9, T10, and T11-T12 similar to the prior study. Remaining cord signal normal.   Paraspinal and other soft tissues: Negative for paraspinous mass, adenopathy, or fluid collection.   Disc levels:   Congenitally small canal. Multilevel spinal stenosis and degenerative change throughout the thoracic spine.   T1-2: Posterior decompression. Bilateral facet hypertrophy. Moderate foraminal stenosis bilaterally   T2-3: Posterior decompression. Bilateral facet hypertrophy. Moderate foraminal stenosis bilaterally.   T3-4: Mild facet degeneration.  Mild foraminal narrowing bilaterally   T4-5: Moderate facet hypertrophy bilaterally. Moderate foraminal narrowing bilaterally.   T5-6: Mild disc bulging. Mild spinal stenosis. Bilateral facet degeneration. Mild foraminal narrowing bilaterally   T6-7: Mild disc bulging and bilateral facet degeneration. Mild foraminal narrowing bilaterally.   T7-8: Mild disc degeneration and disc bulging. Bilateral facet hypertrophy. Moderate foraminal stenosis bilaterally    T8-9: Bilateral facet degeneration. Mild foraminal narrowing bilaterally   T9-10: Posterior decompression. Bilateral facet hypertrophy. Bilateral foraminal stenosis   T10-11: Posterior decompression. Spinal canal adequate in size. Bilateral facet hypertrophy. Moderate foraminal stenosis bilaterally.   T11-12: Posterior decompression with mild spinal stenosis. Diffuse endplate spurring and bilateral facet hypertrophy. Marked foraminal encroachment bilaterally.   IMPRESSION: 1. Congenital thoracic stenosis. Extensive multilevel disc and facet degeneration throughout the thoracic spine. Degenerative change most prominent the facet joints. Multilevel foraminal encroachment due to spurring. 2. Chronic myelomalacia with cord hyperintensity at T9, T10, and T11-12 similar to the prior study. 3. Overall no change from the prior MRI.      TODAY'S TREATMENT:     1/10  -STM L QL and lumbar paraspinals -L UPA and CPA grade III of L3-5  -self STM with tennis ball -prayer stretch 5s 5x -Jefferson curl 30s 3x -Seated QL with legs crossed 30s 3x                                                                                                                             Treatment                            EVAL:  Sidelying hip circles & arcs Sidelying plank- provided options for addition of UE an LE strength Seated abdominal engagement- c-sit with upright posture   PATIENT EDUCATION:  Education details: Anatomy of condition, POC, HEP, exercise form/rationale Person educated: Patient Education method: Explanation, Demonstration, Tactile cues, Verbal cues, and Handouts Education comprehension: verbalized understanding, returned demonstration, verbal cues required, tactile cues required, and needs further education  HOME EXERCISE PROGRAM: [id number]  ASSESSMENT:  CLINICAL IMPRESSION: Pt presents to beginning of  session with R SB and L rotation with mild stiffness and pain.  Following joint mobs and STM, pt able to reach symmetrically with test-retest. Pt give general lumbar mobility exercise and advised to consider gentle yoga options available at the [facility] given his lack of him and lumbar mobility. Pt shown self STM options for self management of future pain. Plan to progress lumbopelvic strength and abdominal bracing strength with gym based movements at future session for progression of strength as tolerated. Pt would benefit from continued skilled therapy in order to reach goals and maximize functional lumbar strength and ROM for full return to PLOF.   OBJECTIVE IMPAIRMENTS: decreased strength, increased muscle spasms, impaired flexibility, improper body mechanics, postural dysfunction, and pain.   ACTIVITY LIMITATIONS: carrying, lifting, bending, standing, squatting, and physical fitness  PARTICIPATION LIMITATIONS:  limited in mobility when pain has been present  PERSONAL FACTORS:  h/o spinal stenosis and surgical intervention  are also affecting patient's functional outcome.   REHAB POTENTIAL: Good  CLINICAL DECISION MAKING: Stable/uncomplicated  EVALUATION COMPLEXITY: Low   GOALS: Goals reviewed with patient? Yes  LONG TERM GOALS: Target date: POC date  Pt able to demonstrate upright posture with abdominal engagement Baseline:  Goal status: INITIAL  2.  Indepdnent with long term core stabilization program Baseline:  Goal status: INITIAL  3.  Pt will not experience onset of back pain Baseline:  Goal status: INITIAL  4.  Independent with long term flexibility program Baseline:  Goal status: INITIAL   PLAN:  PT FREQUENCY: 1x/week  PT DURATION: 6 weeks  PLANNED INTERVENTIONS: Therapeutic exercises, Therapeutic activity, Neuromuscular re-education, Balance training, Gait training, Patient/Family education, Self Care, Joint mobilization, Stair training, Aquatic Therapy, Dry Needling, Electrical stimulation, Spinal mobilization,  Cryotherapy, Moist heat, Taping, Ionotophoresis 4mg /ml Dexamethasone, Manual therapy, and Re-evaluation.  PLAN FOR NEXT SESSION: manual PRN; consider TPDN; consider suitcase carries or paloff presses, continue core stability, review lifting techniques  Daleen Bo PT, DPT 04/18/22 5:36 PM

## 2022-04-26 ENCOUNTER — Encounter (HOSPITAL_BASED_OUTPATIENT_CLINIC_OR_DEPARTMENT_OTHER): Payer: Self-pay | Admitting: Physical Therapy

## 2022-04-26 ENCOUNTER — Ambulatory Visit (HOSPITAL_BASED_OUTPATIENT_CLINIC_OR_DEPARTMENT_OTHER): Payer: 59 | Admitting: Physical Therapy

## 2022-04-26 DIAGNOSIS — M47816 Spondylosis without myelopathy or radiculopathy, lumbar region: Secondary | ICD-10-CM | POA: Diagnosis not present

## 2022-04-26 DIAGNOSIS — M5459 Other low back pain: Secondary | ICD-10-CM

## 2022-04-26 NOTE — Therapy (Signed)
OUTPATIENT PHYSICAL THERAPY THORACOLUMBAR EVALUATION   Patient Name: Scott Lane MRN: [id number] DOB:11/06/1985, 37 y.o., male Today's Date: 04/26/2022  END OF SESSION:  PT End of Session - 04/26/22 1345     Visit Number 3    Number of Visits 7    Date for PT Re-Evaluation 05/25/22    Authorization Type [employer]    PT Start Time 1346    PT Stop Time 1425    PT Time Calculation (min) 39 min    Activity Tolerance Patient tolerated treatment well    Behavior During Therapy [facility] for tasks assessed/performed             Past Medical History:  Diagnosis Date   Chicken pox    Past Surgical History:  Procedure Laterality Date   LUMBAR LAMINECTOMY/DECOMPRESSION MICRODISCECTOMY N/A 08/01/2017   Procedure: Thoracic nine, ten, eleven, twelve laminectomy;  Surgeon: Eustace Moore, MD;  Location: [place];  Service: Neurosurgery;  Laterality: N/A;   none     THORACIC DISCECTOMY N/A 12/27/2017   Procedure: Laminectomy and Foraminotomy - Thoracic one - Thoracic three;  Surgeon: Eustace Moore, MD;  Location: [place];  Service: Neurosurgery;  Laterality: N/A;   WISDOM TOOTH EXTRACTION     bilateral lower.   Patient Active Problem List   Diagnosis Date Noted   S/P lumbar laminectomy 12/27/2017   Quadriplegia, unspecified ([place]) 11/07/2017   Leukocytosis    Neuropathic pain    Drug induced constipation    Urinary retention    Class 1 obesity due to excess calories with serious comorbidity and body mass index (BMI) of 33.0 to 33.9 in adult    Myelopathy ([place]) 08/01/2017   Thoracic spinal stenosis 07/31/2017     REFERRING PROVIDER:  Eleonore Chiquito, NP    REFERRING DIAG: [phone] (ICD-10-CM) - Spondylosis without myelopathy or radiculopathy, lumbar region   Rationale for Evaluation and Treatment: Rehabilitation  THERAPY DIAG:  Other low back pain  ONSET DATE: about 2 months ago  SUBJECTIVE:                                                                                                                                                                                            SUBJECTIVE STATEMENT: I don't think the stiffness returned, I think it was just that day.   PERTINENT HISTORY:  Thoracic spinal stenosis and h/o thoraco lumbar surgery  PAIN:  Are you having pain? No  PRECAUTIONS: None  WEIGHT BEARING RESTRICTIONS: No  FALLS:  Has patient fallen in last 6 months? No  LIVING ENVIRONMENT: Lives with: lives with their family  OCCUPATION: mental  health therapist  PLOF: Independent  PATIENT GOALS: continue exercise 5-6 days/week   OBJECTIVE:   DIAGNOSTIC FINDINGS:  MRI thoracic spine 11/09/20:  FINDINGS: Alignment:  Normal   Vertebrae: Negative for fracture or mass. Decompressive laminectomy T1 through T3. Decompressive laminectomy T9 through T11.   Cord: Patchy cord hyperintensity at T9, T10, and T11-T12 similar to the prior study. Remaining cord signal normal.   Paraspinal and other soft tissues: Negative for paraspinous mass, adenopathy, or fluid collection.   Disc levels:   Congenitally small canal. Multilevel spinal stenosis and degenerative change throughout the thoracic spine.   T1-2: Posterior decompression. Bilateral facet hypertrophy. Moderate foraminal stenosis bilaterally   T2-3: Posterior decompression. Bilateral facet hypertrophy. Moderate foraminal stenosis bilaterally.   T3-4: Mild facet degeneration.  Mild foraminal narrowing bilaterally   T4-5: Moderate facet hypertrophy bilaterally. Moderate foraminal narrowing bilaterally.   T5-6: Mild disc bulging. Mild spinal stenosis. Bilateral facet degeneration. Mild foraminal narrowing bilaterally   T6-7: Mild disc bulging and bilateral facet degeneration. Mild foraminal narrowing bilaterally.   T7-8: Mild disc degeneration and disc bulging. Bilateral facet hypertrophy. Moderate foraminal stenosis bilaterally   T8-9: Bilateral facet degeneration. Mild foraminal  narrowing bilaterally   T9-10: Posterior decompression. Bilateral facet hypertrophy. Bilateral foraminal stenosis   T10-11: Posterior decompression. Spinal canal adequate in size. Bilateral facet hypertrophy. Moderate foraminal stenosis bilaterally.   T11-12: Posterior decompression with mild spinal stenosis. Diffuse endplate spurring and bilateral facet hypertrophy. Marked foraminal encroachment bilaterally.   IMPRESSION: 1. Congenital thoracic stenosis. Extensive multilevel disc and facet degeneration throughout the thoracic spine. Degenerative change most prominent the facet joints. Multilevel foraminal encroachment due to spurring. 2. Chronic myelomalacia with cord hyperintensity at T9, T10, and T11-12 similar to the prior study. 3. Overall no change from the prior MRI.      TODAY'S TREATMENT:     Treatment                            04/26/22:  Qped series: tra activation, bird dog, curls, fire hydrant, primal push up Side plank with clams Criss cross stretch Single leg straddle Butterfly stretch Lunge hip flexor stretch Gastroc stretch   1/10  -STM L QL and lumbar paraspinals -L UPA and CPA grade III of L3-5  -self STM with tennis ball -prayer stretch 5s 5x -Jefferson curl 30s 3x -Seated QL with legs crossed 30s 3x                                                                                                                             Treatment                            EVAL:  Sidelying hip circles & arcs Sidelying plank- provided options for addition of UE an LE strength Seated abdominal engagement- c-sit with upright posture   PATIENT EDUCATION:  Education details:  Anatomy of condition, POC, HEP, exercise form/rationale Person educated: Patient Education method: Explanation, Demonstration, Tactile cues, Verbal cues, and Handouts Education comprehension: verbalized understanding, returned demonstration, verbal cues required, tactile cues required,  and needs further education  HOME EXERCISE PROGRAM: [id number]  ASSESSMENT:  CLINICAL IMPRESSION: Progressed core contraction in quadruped for unloaded, coordination core challenge. Added gross stretching to HEP as well.    OBJECTIVE IMPAIRMENTS: decreased strength, increased muscle spasms, impaired flexibility, improper body mechanics, postural dysfunction, and pain.   ACTIVITY LIMITATIONS: carrying, lifting, bending, standing, squatting, and physical fitness  PARTICIPATION LIMITATIONS:  limited in mobility when pain has been present  PERSONAL FACTORS:  h/o spinal stenosis and surgical intervention  are also affecting patient's functional outcome.   REHAB POTENTIAL: Good  CLINICAL DECISION MAKING: Stable/uncomplicated  EVALUATION COMPLEXITY: Low   GOALS: Goals reviewed with patient? Yes  LONG TERM GOALS: Target date: POC date  Pt able to demonstrate upright posture with abdominal engagement Baseline:  Goal status: INITIAL  2.  Indepdnent with long term core stabilization program Baseline:  Goal status: INITIAL  3.  Pt will not experience onset of back pain Baseline:  Goal status: INITIAL  4.  Independent with long term flexibility program Baseline:  Goal status: INITIAL   PLAN:  PT FREQUENCY: 1x/week  PT DURATION: 6 weeks  PLANNED INTERVENTIONS: Therapeutic exercises, Therapeutic activity, Neuromuscular re-education, Balance training, Gait training, Patient/Family education, Self Care, Joint mobilization, Stair training, Aquatic Therapy, Dry Needling, Electrical stimulation, Spinal mobilization, Cryotherapy, Moist heat, Taping, Ionotophoresis 4mg /ml Dexamethasone, Manual therapy, and Re-evaluation.  PLAN FOR NEXT SESSION: manual PRN; consider TPDN; consider suitcase carries or paloff presses, continue core stability, review lifting techniques  Angello Chien C. Kursten Kruk PT, DPT 04/26/22 2:40 PM

## 2022-04-27 ENCOUNTER — Other Ambulatory Visit: Payer: Self-pay | Admitting: Student

## 2022-04-27 DIAGNOSIS — M5416 Radiculopathy, lumbar region: Secondary | ICD-10-CM

## 2022-05-02 ENCOUNTER — Ambulatory Visit (HOSPITAL_BASED_OUTPATIENT_CLINIC_OR_DEPARTMENT_OTHER): Payer: 59 | Admitting: Physical Therapy

## 2022-05-02 ENCOUNTER — Encounter (HOSPITAL_BASED_OUTPATIENT_CLINIC_OR_DEPARTMENT_OTHER): Payer: Self-pay | Admitting: Physical Therapy

## 2022-05-02 DIAGNOSIS — M47816 Spondylosis without myelopathy or radiculopathy, lumbar region: Secondary | ICD-10-CM | POA: Diagnosis not present

## 2022-05-02 DIAGNOSIS — M5459 Other low back pain: Secondary | ICD-10-CM

## 2022-05-02 NOTE — Therapy (Signed)
OUTPATIENT PHYSICAL THERAPY THORACOLUMBAR TREATMENT   Patient Name: Scott Lane MRN: [id number] DOB:May 29, 1985, 37 y.o., male, male Today's Date: 05/02/2022  END OF SESSION:  PT End of Session - 05/02/22 1651     Visit Number 4    Number of Visits 7    Date for PT Re-Evaluation 05/25/22    Authorization Type [employer]    PT Start Time 1650   arrives late   PT Stop Time 1728    PT Time Calculation (min) 38 min    Activity Tolerance Patient tolerated treatment well    Behavior During Therapy [facility] for tasks assessed/performed              Past Medical History:  Diagnosis Date   Chicken pox    Past Surgical History:  Procedure Laterality Date   LUMBAR LAMINECTOMY/DECOMPRESSION MICRODISCECTOMY N/A 08/01/2017   Procedure: Thoracic nine, ten, eleven, twelve laminectomy;  Surgeon: Eustace Moore, MD;  Location: [place];  Service: Neurosurgery;  Laterality: N/A;   none     THORACIC DISCECTOMY N/A 12/27/2017   Procedure: Laminectomy and Foraminotomy - Thoracic one - Thoracic three;  Surgeon: Eustace Moore, MD;  Location: [place];  Service: Neurosurgery;  Laterality: N/A;   WISDOM TOOTH EXTRACTION     bilateral lower.   Patient Active Problem List   Diagnosis Date Noted   S/P lumbar laminectomy 12/27/2017   Quadriplegia, unspecified ([place]) 11/07/2017   Leukocytosis    Neuropathic pain    Drug induced constipation    Urinary retention    Class 1 obesity due to excess calories with serious comorbidity and body mass index (BMI) of 33.0 to 33.9 in adult    Myelopathy ([place]) 08/01/2017   Thoracic spinal stenosis 07/31/2017     REFERRING PROVIDER:  Eleonore Chiquito, NP    REFERRING DIAG: [id number] (ICD-10-CM) - Spondylosis without myelopathy or radiculopathy, lumbar region   Rationale for Evaluation and Treatment: Rehabilitation  THERAPY DIAG:  Other low back pain  ONSET DATE: about 2 months ago  SUBJECTIVE:                                                                                                                                                                                            SUBJECTIVE STATEMENT: Pt states that the stiffness is less than before. 1-2 out of the week will be stiff.   PERTINENT HISTORY:  Thoracic spinal stenosis and h/o thoraco lumbar surgery  PAIN:  Are you having pain? No  PRECAUTIONS: None  WEIGHT BEARING RESTRICTIONS: No  FALLS:  Has patient fallen in last 6 months? No  LIVING ENVIRONMENT: Lives  with: lives with their family  OCCUPATION: mental health therapist  PLOF: Independent  PATIENT GOALS: continue exercise 5-6 days/week   OBJECTIVE:   DIAGNOSTIC FINDINGS:  MRI thoracic spine 11/09/20:  FINDINGS: Alignment:  Normal   Vertebrae: Negative for fracture or mass. Decompressive laminectomy T1 through T3. Decompressive laminectomy T9 through T11.   Cord: Patchy cord hyperintensity at T9, T10, and T11-T12 similar to the prior study. Remaining cord signal normal.   Paraspinal and other soft tissues: Negative for paraspinous mass, adenopathy, or fluid collection.   Disc levels:   Congenitally small canal. Multilevel spinal stenosis and degenerative change throughout the thoracic spine.   T1-2: Posterior decompression. Bilateral facet hypertrophy. Moderate foraminal stenosis bilaterally   T2-3: Posterior decompression. Bilateral facet hypertrophy. Moderate foraminal stenosis bilaterally.   T3-4: Mild facet degeneration.  Mild foraminal narrowing bilaterally   T4-5: Moderate facet hypertrophy bilaterally. Moderate foraminal narrowing bilaterally.   T5-6: Mild disc bulging. Mild spinal stenosis. Bilateral facet degeneration. Mild foraminal narrowing bilaterally   T6-7: Mild disc bulging and bilateral facet degeneration. Mild foraminal narrowing bilaterally.   T7-8: Mild disc degeneration and disc bulging. Bilateral facet hypertrophy. Moderate foraminal stenosis bilaterally   T8-9: Bilateral  facet degeneration. Mild foraminal narrowing bilaterally   T9-10: Posterior decompression. Bilateral facet hypertrophy. Bilateral foraminal stenosis   T10-11: Posterior decompression. Spinal canal adequate in size. Bilateral facet hypertrophy. Moderate foraminal stenosis bilaterally.   T11-12: Posterior decompression with mild spinal stenosis. Diffuse endplate spurring and bilateral facet hypertrophy. Marked foraminal encroachment bilaterally.   IMPRESSION: 1. Congenital thoracic stenosis. Extensive multilevel disc and facet degeneration throughout the thoracic spine. Degenerative change most prominent the facet joints. Multilevel foraminal encroachment due to spurring. 2. Chronic myelomalacia with cord hyperintensity at T9, T10, and T11-12 similar to the prior study. 3. Overall no change from the prior MRI.      TODAY'S TREATMENT:     Treatment                            05/02/22:  Qped series:, bird dogs, bird dog row 15lbs  Side plank with clams  Kneeling hip flexor stretch 30s 3x Pidgeon pose 30s 3x  Seated fig 4 30s 3x each Jeffferson curl 3x5 15lbs Staggered stance RDL 15lbs 10x Deep squat with UE assist 10x with holds   Treatment                            04/26/22:  Qped series: tra activation, bird dog, curls, fire hydrant, primal push up Side plank with clams Criss cross stretch Single leg straddle Butterfly stretch Lunge hip flexor stretch Gastroc stretch   1/10  -STM L QL and lumbar paraspinals -L UPA and CPA grade III of L3-5  -self STM with tennis ball -prayer stretch 5s 5x -Jefferson curl 30s 3x -Seated QL with legs crossed 30s 3x  Treatment                            EVAL:  Sidelying hip circles & arcs Sidelying plank- provided options for addition of UE an LE strength Seated abdominal engagement- c-sit with upright  posture   PATIENT EDUCATION:  Education details: Anatomy of condition, POC, HEP, exercise form/rationale Person educated: Patient Education method: Explanation, Demonstration, Tactile cues, Verbal cues, and Handouts Education comprehension: verbalized understanding, returned demonstration, verbal cues required, tactile cues required, and needs further education  HOME EXERCISE PROGRAM: [id number]  ASSESSMENT:  CLINICAL IMPRESSION: Pt  continues to have generalized hip and lumbar stiffness but was able to perform moderately loaded exercise today for global lumbopelvic mobility. Pt is very limited in hip ext, hip ER, and IR. Pt given edu about dynamic warm up as well as options for self guided mobility exercise. Pt encouraged to try Yoga I classes at the [facility] as well to improve flexibility and stability. Plan to continue with LE stretching and pelvic motor control as tolerated. No pain noted during session.    OBJECTIVE IMPAIRMENTS: decreased strength, increased muscle spasms, impaired flexibility, improper body mechanics, postural dysfunction, and pain.   ACTIVITY LIMITATIONS: carrying, lifting, bending, standing, squatting, and physical fitness  PARTICIPATION LIMITATIONS:  limited in mobility when pain has been present  PERSONAL FACTORS:  h/o spinal stenosis and surgical intervention  are also affecting patient's functional outcome.   REHAB POTENTIAL: Good  CLINICAL DECISION MAKING: Stable/uncomplicated  EVALUATION COMPLEXITY: Low   GOALS:   LONG TERM GOALS: Target date: POC date  Pt able to demonstrate upright posture with abdominal engagement Baseline:  Goal status: INITIAL  2.  Indepdnent with long term core stabilization program Baseline:  Goal status: INITIAL  3.  Pt will not experience onset of back pain Baseline:  Goal status: INITIAL  4.  Independent with long term flexibility program Baseline:  Goal status: INITIAL   PLAN:  PT FREQUENCY: 1x/week  PT  DURATION: 6 weeks  PLANNED INTERVENTIONS: Therapeutic exercises, Therapeutic activity, Neuromuscular re-education, Balance training, Gait training, Patient/Family education, Self Care, Joint mobilization, Stair training, Aquatic Therapy, Dry Needling, Electrical stimulation, Spinal mobilization, Cryotherapy, Moist heat, Taping, Ionotophoresis 4mg /ml Dexamethasone, Manual therapy, and Re-evaluation.  PLAN FOR NEXT SESSION: consider TPDN; progressive mobility, continue core stability, review lifting techniques  PT, DPT 05/02/22 5:41 PM

## 2022-05-11 ENCOUNTER — Ambulatory Visit (HOSPITAL_BASED_OUTPATIENT_CLINIC_OR_DEPARTMENT_OTHER): Payer: 59 | Attending: Student | Admitting: Physical Therapy

## 2022-05-11 ENCOUNTER — Encounter (HOSPITAL_BASED_OUTPATIENT_CLINIC_OR_DEPARTMENT_OTHER): Payer: Self-pay | Admitting: Physical Therapy

## 2022-05-11 DIAGNOSIS — M5459 Other low back pain: Secondary | ICD-10-CM | POA: Insufficient documentation

## 2022-05-11 NOTE — Therapy (Signed)
OUTPATIENT PHYSICAL THERAPY THORACOLUMBAR D/C   Patient Name: Scott Lane MRN: [id number] DOB:01/10/1986, 37 y.o., male Today's Date: 05/11/2022  END OF SESSION:  PT End of Session - 05/11/22 1014     Visit Number 5    Number of Visits 7    Date for PT Re-Evaluation 05/25/22    Authorization Type [employer]    PT Start Time 1015    PT Stop Time 1025    PT Time Calculation (min) 10 min    Activity Tolerance Patient tolerated treatment well    Behavior During Therapy [facility] for tasks assessed/performed              Past Medical History:  Diagnosis Date   Chicken pox    Past Surgical History:  Procedure Laterality Date   LUMBAR LAMINECTOMY/DECOMPRESSION MICRODISCECTOMY N/A 08/01/2017   Procedure: Thoracic nine, ten, eleven, twelve laminectomy;  Surgeon: Eustace Moore, MD;  Location: [place];  Service: Neurosurgery;  Laterality: N/A;   none     THORACIC DISCECTOMY N/A 12/27/2017   Procedure: Laminectomy and Foraminotomy - Thoracic one - Thoracic three;  Surgeon: Eustace Moore, MD;  Location: [place];  Service: Neurosurgery;  Laterality: N/A;   WISDOM TOOTH EXTRACTION     bilateral lower.   Patient Active Problem List   Diagnosis Date Noted   S/P lumbar laminectomy 12/27/2017   Quadriplegia, unspecified ([place]) 11/07/2017   Leukocytosis    Neuropathic pain    Drug induced constipation    Urinary retention    Class 1 obesity due to excess calories with serious comorbidity and body mass index (BMI) of 33.0 to 33.9 in adult    Myelopathy ([place]) 08/01/2017   Thoracic spinal stenosis 07/31/2017     REFERRING PROVIDER:  Eleonore Chiquito, NP    REFERRING DIAG: [phone] (ICD-10-CM) - Spondylosis without myelopathy or radiculopathy, lumbar region   Rationale for Evaluation and Treatment: Rehabilitation  THERAPY DIAG:  Other low back pain  ONSET DATE: about 2 months ago  SUBJECTIVE:                                                                                                                                                                                            SUBJECTIVE STATEMENT: Overall doing really well and doing the gym exercises.  PERTINENT HISTORY:  Thoracic spinal stenosis and h/o thoraco lumbar surgery  PAIN:  Are you having pain? No  PRECAUTIONS: None  WEIGHT BEARING RESTRICTIONS: No  FALLS:  Has patient fallen in last 6 months? No  LIVING ENVIRONMENT: Lives with: lives with their family  OCCUPATION: mental health therapist  PLOF:  Independent  PATIENT GOALS: continue exercise 5-6 days/week   OBJECTIVE:   DIAGNOSTIC FINDINGS:  MRI thoracic spine 11/09/20:  FINDINGS: Alignment:  Normal   Vertebrae: Negative for fracture or mass. Decompressive laminectomy T1 through T3. Decompressive laminectomy T9 through T11.   Cord: Patchy cord hyperintensity at T9, T10, and T11-T12 similar to the prior study. Remaining cord signal normal.   Paraspinal and other soft tissues: Negative for paraspinous mass, adenopathy, or fluid collection.   Disc levels:   Congenitally small canal. Multilevel spinal stenosis and degenerative change throughout the thoracic spine.   T1-2: Posterior decompression. Bilateral facet hypertrophy. Moderate foraminal stenosis bilaterally   T2-3: Posterior decompression. Bilateral facet hypertrophy. Moderate foraminal stenosis bilaterally.   T3-4: Mild facet degeneration.  Mild foraminal narrowing bilaterally   T4-5: Moderate facet hypertrophy bilaterally. Moderate foraminal narrowing bilaterally.   T5-6: Mild disc bulging. Mild spinal stenosis. Bilateral facet degeneration. Mild foraminal narrowing bilaterally   T6-7: Mild disc bulging and bilateral facet degeneration. Mild foraminal narrowing bilaterally.   T7-8: Mild disc degeneration and disc bulging. Bilateral facet hypertrophy. Moderate foraminal stenosis bilaterally   T8-9: Bilateral facet degeneration. Mild foraminal  narrowing bilaterally   T9-10: Posterior decompression. Bilateral facet hypertrophy. Bilateral foraminal stenosis   T10-11: Posterior decompression. Spinal canal adequate in size. Bilateral facet hypertrophy. Moderate foraminal stenosis bilaterally.   T11-12: Posterior decompression with mild spinal stenosis. Diffuse endplate spurring and bilateral facet hypertrophy. Marked foraminal encroachment bilaterally.   IMPRESSION: 1. Congenital thoracic stenosis. Extensive multilevel disc and facet degeneration throughout the thoracic spine. Degenerative change most prominent the facet joints. Multilevel foraminal encroachment due to spurring. 2. Chronic myelomalacia with cord hyperintensity at T9, T10, and T11-12 similar to the prior study. 3. Overall no change from the prior MRI.      TODAY'S TREATMENT:     Treatment                            05/02/22:  Qped series:, bird dogs, bird dog row 15lbs  Side plank with clams  Kneeling hip flexor stretch 30s 3x Pidgeon pose 30s 3x  Seated fig 4 30s 3x each Jeffferson curl 3x5 15lbs Staggered stance RDL 15lbs 10x Deep squat with UE assist 10x with holds   Treatment                            04/26/22:  Qped series: tra activation, bird dog, curls, fire hydrant, primal push up Side plank with clams Criss cross stretch Single leg straddle Butterfly stretch Lunge hip flexor stretch Gastroc stretch   1/10  -STM L QL and lumbar paraspinals -L UPA and CPA grade III of L3-5  -self STM with tennis ball -prayer stretch 5s 5x -Jefferson curl 30s 3x -Seated QL with legs crossed 30s 3x  Treatment                            EVAL:  Sidelying hip circles & arcs Sidelying plank- provided options for addition of UE an LE strength Seated abdominal engagement- c-sit with upright posture   PATIENT EDUCATION:   Education details: Anatomy of condition, POC, HEP, exercise form/rationale Person educated: Patient Education method: Explanation, Demonstration, Tactile cues, Verbal cues, and Handouts Education comprehension: verbalized understanding, returned demonstration, verbal cues required, tactile cues required, and needs further education  HOME EXERCISE PROGRAM: [id number]  ASSESSMENT:  CLINICAL IMPRESSION: Patient has met all of his goals at this time and feels comfortable in progression to independent home program.  Encouraged him to reach out with any further questions as well as monitor signs and symptoms of any progression of pain or spinal compression.   OBJECTIVE IMPAIRMENTS: decreased strength, increased muscle spasms, impaired flexibility, improper body mechanics, postural dysfunction, and pain.   ACTIVITY LIMITATIONS: carrying, lifting, bending, standing, squatting, and physical fitness  PARTICIPATION LIMITATIONS:  limited in mobility when pain has been present  PERSONAL FACTORS:  h/o spinal stenosis and surgical intervention  are also affecting patient's functional outcome.   REHAB POTENTIAL: Good  CLINICAL DECISION MAKING: Stable/uncomplicated  EVALUATION COMPLEXITY: Low   GOALS:   LONG TERM GOALS: Target date: POC date  Pt able to demonstrate upright posture with abdominal engagement Baseline:  Goal status: Achieved  2.  Indepdnent with long term core stabilization program Baseline:  Goal status: Achieved  3.  Pt will not experience onset of back pain Baseline:  Goal status: Achieved  4.  Independent with long term flexibility program Baseline:  Goal status: Achieved   PLAN:  PT FREQUENCY: 1x/week  PT DURATION: 6 weeks  PLANNED INTERVENTIONS: Therapeutic exercises, Therapeutic activity, Neuromuscular re-education, Balance training, Gait training, Patient/Family education, Self Care, Joint mobilization, Stair training, Aquatic Therapy, Dry Needling,  Electrical stimulation, Spinal mobilization, Cryotherapy, Moist heat, Taping, Ionotophoresis 4mg /ml Dexamethasone, Manual therapy, and Re-evaluation.    Hanne Kegg C. Yanique Mulvihill PT, DPT 05/11/22 8:13 PM

## 2022-05-16 ENCOUNTER — Ambulatory Visit (HOSPITAL_BASED_OUTPATIENT_CLINIC_OR_DEPARTMENT_OTHER): Payer: 59 | Admitting: Physical Therapy

## 2022-05-23 ENCOUNTER — Encounter (HOSPITAL_BASED_OUTPATIENT_CLINIC_OR_DEPARTMENT_OTHER): Payer: 59 | Admitting: Physical Therapy

## 2022-05-26 ENCOUNTER — Inpatient Hospital Stay: Admission: RE | Admit: 2022-05-26 | Payer: 59 | Source: Ambulatory Visit

## 2022-06-02 ENCOUNTER — Ambulatory Visit
Admission: RE | Admit: 2022-06-02 | Discharge: 2022-06-02 | Disposition: A | Discharge status: Home / Self Care | Payer: 59 | Source: Ambulatory Visit | Attending: Student | Admitting: Student

## 2022-06-02 DIAGNOSIS — M5416 Radiculopathy, lumbar region: Secondary | ICD-10-CM

## 2022-06-11 ENCOUNTER — Other Ambulatory Visit (HOSPITAL_COMMUNITY): Payer: Self-pay

## 2022-06-12 ENCOUNTER — Other Ambulatory Visit (HOSPITAL_COMMUNITY): Payer: Self-pay

## 2022-06-12 MED ORDER — MELOXICAM 15 MG PO TABS
15.0000 mg | ORAL_TABLET | Freq: Every day | ORAL | 5 refills | Status: DC
  Filled 2022-06-12 – 2022-06-28 (×3): qty 90, 90d supply, fill #0
  Filled 2022-09-29: qty 90, 90d supply, fill #1

## 2022-06-21 ENCOUNTER — Other Ambulatory Visit (HOSPITAL_COMMUNITY): Payer: Self-pay

## 2022-06-28 ENCOUNTER — Other Ambulatory Visit (HOSPITAL_COMMUNITY): Payer: Self-pay

## 2022-07-23 ENCOUNTER — Other Ambulatory Visit: Payer: Self-pay

## 2022-07-23 ENCOUNTER — Ambulatory Visit (HOSPITAL_BASED_OUTPATIENT_CLINIC_OR_DEPARTMENT_OTHER): Payer: Managed Care, Other (non HMO) | Attending: Student | Admitting: Physical Therapy

## 2022-07-23 ENCOUNTER — Encounter (HOSPITAL_BASED_OUTPATIENT_CLINIC_OR_DEPARTMENT_OTHER): Payer: Self-pay | Admitting: Physical Therapy

## 2022-07-23 DIAGNOSIS — M5459 Other low back pain: Secondary | ICD-10-CM | POA: Diagnosis present

## 2022-07-23 NOTE — Therapy (Signed)
OUTPATIENT PHYSICAL THERAPY THORACOLUMBAR EVALUATION   Patient Name: Scott Lane MRN: [id number] DOB:[birth year], 37 y.o., male Today's Date: 07/23/2022  END OF SESSION:  PT End of Session - 07/23/22 1608     Visit Number 1    Number of Visits 4    Date for PT Re-Evaluation 09/17/22    Authorization Type [employer]    PT Start Time 1608    PT Stop Time 1646    PT Time Calculation (min) 38 min    Activity Tolerance Patient tolerated treatment well    Behavior During Therapy [facility] for tasks assessed/performed             Past Medical History:  Diagnosis Date   Chicken pox    Past Surgical History:  Procedure Laterality Date   LUMBAR LAMINECTOMY/DECOMPRESSION MICRODISCECTOMY N/A 08/01/2017   Procedure: Thoracic nine, ten, eleven, twelve laminectomy;  Surgeon: Tia Alert, MD;  Location: [facility];  Service: Neurosurgery;  Laterality: N/A;   none     THORACIC DISCECTOMY N/A 12/27/2017   Procedure: Laminectomy and Foraminotomy - Thoracic one - Thoracic three;  Surgeon: Tia Alert, MD;  Location: [facility] OR;  Service: Neurosurgery;  Laterality: N/A;   WISDOM TOOTH EXTRACTION     bilateral lower.   Patient Active Problem List   Diagnosis Date Noted   S/P lumbar laminectomy 12/27/2017   Quadriplegia, unspecified 11/07/2017   Leukocytosis    Neuropathic pain    Drug induced constipation    Urinary retention    Class 1 obesity due to excess calories with serious comorbidity and body mass index (BMI) of 33.0 to 33.9 in adult    Myelopathy 08/01/2017   Thoracic spinal stenosis 07/31/2017     REFERRING PROVIDER: Verlin Dike, NP  REFERRING DIAG: M54.16 Lumbar Radiculopathy  Rationale for Evaluation and Treatment: Rehabilitation  THERAPY DIAG:  Other low back pain  ONSET DATE: chronic with most recent onset around Dec 2023  SUBJECTIVE:                                                                                                                                                                                            SUBJECTIVE STATEMENT: I don't have any pain, just stiffness in the leg. Doing stretches and exercises. Tightness in Lt lower buttock and knee.   PERTINENT HISTORY:  Degenerative lumbar changes  PAIN:  Are you having pain? No  PRECAUTIONS: None  WEIGHT BEARING RESTRICTIONS: No  FALLS:  Has patient fallen in last 6 months? No  LIVING ENVIRONMENT: Lives with: lives with their family  OCCUPATION: mental health therapist  PLOF: Independent  PATIENT  GOALS: decrease tightness  OBJECTIVE:   DIAGNOSTIC FINDINGS:  MRI 06/02/22 IMPRESSION: 1. Mildly progressive lumbar disc and facet degeneration superimposed on congenitally short pedicles. 2. Moderate spinal stenosis, severe right lateral recess stenosis, and moderate to severe right neural foraminal stenosis at L4-5. 3. Mild-to-moderate spinal stenosis at L2-3. 4. Moderate left neural foraminal stenosis at L5-S1.  PATIENT SURVEYS:  FOTO 40  COGNITION: Overall cognitive status: Within functional limits for tasks assessed     SENSATION: WFL  PALPATION: EVAL: increased tightness across left gluts and bil lumbar paraspinals  LUMBAR ROM:   AROM eval  Flexion   Extension   Right lateral flexion   Left lateral flexion   Right rotation   Left rotation    (Blank rows = not tested)  LOWER EXTREMITY ROM:     Active  Right eval Left eval  Hip flexion    Hip extension    Hip abduction    Hip adduction    Hip internal rotation    Hip external rotation    Knee flexion    Knee extension    Ankle dorsiflexion    Ankle plantarflexion    Ankle inversion    Ankle eversion     (Blank rows = not tested)  LOWER EXTREMITY MMT:    MMT Right eval Left eval  Hip flexion    Hip extension    Hip abduction    Hip adduction    Hip internal rotation    Hip external rotation    Knee flexion    Knee extension    Ankle dorsiflexion    Ankle plantarflexion    Ankle  inversion    Ankle eversion     (Blank rows = not tested)  LUMBAR SPECIAL TESTS:  Long sit test: distal 1/3 shin and Thomas test: Positive  GAIT: WFL at eval  TODAY'S TREATMENT:                                                                                                                               Treatment                            EVAL 4/15:  Trigger Point Dry Needling, Manual Therapy Treatment:  Initial or subsequent education regarding Trigger Point Dry Needling: Initial Did patient give consent to treatment with Trigger Point Dry Needling: Yes TPDN with skilled palpation and monitoring followed by STM to the following muscles: Lt L4,5 paraspinals, Lt glut max, med  Thomas stretch HSS Discussed golf stretches    PATIENT EDUCATION:  Education details: Teacher, music of condition, POC, HEP, exercise form/rationale Person educated: Patient Education method: Explanation, Demonstration, Tactile cues, Verbal cues, and Handouts Education comprehension: verbalized understanding, returned demonstration, verbal cues required, tactile cues required, and needs further education  HOME EXERCISE PROGRAM: [id number]  ASSESSMENT:  CLINICAL IMPRESSION: Patient is a 37 y.o. M who was seen today for physical  therapy evaluation and treatment for left low back and LE tightness. Following MRI, provider suggested TPDN.      REHAB POTENTIAL: Good  CLINICAL DECISION MAKING: Stable/uncomplicated  EVALUATION COMPLEXITY: Low   GOALS: Goals reviewed with patient? Yes  SHORT TERM GOALS: Target date: 4/26  Determine need for further DN at this time Baseline: Goal status: INITIAL    LONG TERM GOALS: Target date: POC date  Meet FOTO goal Baseline:  Goal status: INITIAL  2.  Resolution of back and knee tightness Baseline:  Goal status: INITIAL  3.  Independent in stretching regimen Baseline:  Goal status: INITIAL   PLAN:  PT FREQUENCY: every other week  PT DURATION: 8  weeks  PLANNED INTERVENTIONS: Therapeutic exercises, Therapeutic activity, Neuromuscular re-education, Balance training, Gait training, Patient/Family education, Self Care, Joint mobilization, Aquatic Therapy, Dry Needling, Electrical stimulation, Spinal mobilization, Cryotherapy, Moist heat, Taping, Manual therapy, and Re-evaluation.  PLAN FOR NEXT SESSION: DN PRN  Ramiz Turpin C. Lounell Schumacher PT, DPT 07/23/22 7:29 PM

## 2022-08-09 ENCOUNTER — Ambulatory Visit (HOSPITAL_BASED_OUTPATIENT_CLINIC_OR_DEPARTMENT_OTHER): Payer: Managed Care, Other (non HMO) | Admitting: Physical Therapy

## 2022-10-04 ENCOUNTER — Encounter: Payer: Self-pay | Admitting: Family Medicine

## 2022-10-04 ENCOUNTER — Ambulatory Visit: Payer: Managed Care, Other (non HMO) | Admitting: Family Medicine

## 2022-10-04 VITALS — BP 122/78 | HR 74 | Temp 97.9°F | Ht 72.0 in | Wt 266.4 lb

## 2022-10-04 DIAGNOSIS — Z131 Encounter for screening for diabetes mellitus: Secondary | ICD-10-CM | POA: Diagnosis not present

## 2022-10-04 DIAGNOSIS — R739 Hyperglycemia, unspecified: Secondary | ICD-10-CM

## 2022-10-04 DIAGNOSIS — G8250 Quadriplegia, unspecified: Secondary | ICD-10-CM

## 2022-10-04 DIAGNOSIS — E785 Hyperlipidemia, unspecified: Secondary | ICD-10-CM | POA: Diagnosis not present

## 2022-10-04 DIAGNOSIS — Z1159 Encounter for screening for other viral diseases: Secondary | ICD-10-CM | POA: Diagnosis not present

## 2022-10-04 DIAGNOSIS — Z0000 Encounter for general adult medical examination without abnormal findings: Secondary | ICD-10-CM | POA: Diagnosis not present

## 2022-10-04 LAB — COMPREHENSIVE METABOLIC PANEL
ALT: 28 U/L (ref 0–53)
AST: 33 U/L (ref 0–37)
Albumin: 4.2 g/dL (ref 3.5–5.2)
Alkaline Phosphatase: 51 U/L (ref 39–117)
BUN: 21 mg/dL (ref 6–23)
CO2: 30 mEq/L (ref 19–32)
Calcium: 9.8 mg/dL (ref 8.4–10.5)
Chloride: 102 mEq/L (ref 96–112)
Creatinine, Ser: 1.01 mg/dL (ref 0.40–1.50)
GFR: 94.88 mL/min (ref >60.00)
Glucose, Bld: 87 mg/dL (ref 70–99)
Potassium: 4.1 mEq/L (ref 3.5–5.1)
Sodium: 138 mEq/L (ref 135–145)
Total Bilirubin: 0.8 mg/dL (ref 0.2–1.2)
Total Protein: 7.5 g/dL (ref 6.0–8.3)

## 2022-10-04 LAB — LIPID PANEL
Cholesterol: 170 mg/dL (ref 0–200)
HDL: 43.9 mg/dL (ref >39.00)
LDL Cholesterol: 92 mg/dL (ref 0–99)
NonHDL: 125.9
Total CHOL/HDL Ratio: 4
Triglycerides: 172 mg/dL — ABNORMAL HIGH (ref 0.0–149.0)
VLDL: 34.4 mg/dL (ref 0.0–40.0)

## 2022-10-04 LAB — CBC WITH DIFFERENTIAL/PLATELET
Basophils Absolute: 0 10*3/uL (ref 0.0–0.1)
Basophils Relative: 0.8 % (ref 0.0–3.0)
Eosinophils Absolute: 0.1 10*3/uL (ref 0.0–0.7)
Eosinophils Relative: 1.3 % (ref 0.0–5.0)
HCT: 46.7 % (ref 39.0–52.0)
Hemoglobin: 15.5 g/dL (ref 13.0–17.0)
Lymphocytes Relative: 32.3 % (ref 12.0–46.0)
Lymphs Abs: 1.7 10*3/uL (ref 0.7–4.0)
MCHC: 33.2 g/dL (ref 30.0–36.0)
MCV: 88.2 fl (ref 78.0–100.0)
Monocytes Absolute: 0.6 10*3/uL (ref 0.1–1.0)
Monocytes Relative: 11.3 % (ref 3.0–12.0)
Neutro Abs: 2.8 10*3/uL (ref 1.4–7.7)
Neutrophils Relative %: 54.3 % (ref 43.0–77.0)
Platelets: 233 10*3/uL (ref 150.0–400.0)
RBC: 5.3 Mil/uL (ref 4.22–5.81)
RDW: 12.9 % (ref 11.5–15.5)
WBC: 5.2 10*3/uL (ref 4.0–10.5)

## 2022-10-04 LAB — HEMOGLOBIN A1C: Hgb A1c MFr Bld: 5.2 % (ref 4.6–6.5)

## 2022-10-04 MED ORDER — TADALAFIL 20 MG PO TABS: ORAL_TABLET | ORAL | 11 refills | Status: DC

## 2022-10-04 NOTE — Progress Notes (Signed)
Phone: [phone]    Subjective:  Patient presents today for their annual physical. Chief complaint-noted.   See problem oriented charting- ROS- full  review of systems was completed and negative  except for: known weakness issues stable  The following were reviewed and entered/updated in epic: Past Medical History:  Diagnosis Date   Arthritis 06/2021   Lower back   Chicken pox    Patient Active Problem List   Diagnosis Date Noted   Quadriplegia, unspecified (HCC) 11/07/2017    Priority: High   Neuropathic pain     Priority: Medium    Drug induced constipation     Priority: Medium    Myelopathy (HCC) 08/01/2017    Priority: Medium    Thoracic spinal stenosis 07/31/2017    Priority: Medium    S/P lumbar laminectomy 12/27/2017    Priority: Low   Leukocytosis     Priority: Low   Urinary retention     Priority: Low   Class 1 obesity due to excess calories with serious comorbidity and body mass index (BMI) of 33.0 to 33.9 in adult     Priority: Low   Past Surgical History:  Procedure Laterality Date   LUMBAR LAMINECTOMY/DECOMPRESSION MICRODISCECTOMY N/A 08/01/2017   Procedure: Thoracic nine, ten, eleven, twelve laminectomy;  Surgeon: Tia Alert, MD;  Location: [facility] OR;  Service: Neurosurgery;  Laterality: N/A;   none     SPINE SURGERY  07/2016   THORACIC DISCECTOMY N/A 12/27/2017   Procedure: Laminectomy and Foraminotomy - Thoracic one - Thoracic three;  Surgeon: Tia Alert, MD;  Location: [facility] OR;  Service: Neurosurgery;  Laterality: N/A;   WISDOM TOOTH EXTRACTION     bilateral lower.    Family History  Problem Relation Age of Onset   Diabetes Mother    Hypertension Father    Lung cancer Father        smoker   Hypertension Brother    Hypertension Maternal Grandmother    Lung cancer Maternal Grandmother        non smoker   Hypertension Paternal Grandmother     Medications- reviewed and updated Current Outpatient Medications  Medication Sig Dispense  Refill   meloxicam (MOBIC) 15 MG tablet Take 1 tablet (15 mg total) by mouth daily. 90 tablet 5   Multiple Vitamins-Minerals (ONE DAILY MULTIVITAMIN MEN PO) Take 1 tablet by mouth daily.     tadalafil (CIALIS) 20 MG tablet TAKE ONE TABLET BY MOUTH DAILY EVERY OTHER DAY AS NEEDED FOR ERECTILE DYSFUNCTION 10 tablet 11   No current facility-administered medications for this visit.    Allergies-reviewed and updated No Known Allergies  Social History   Social History Narrative   Married 2018. Daughter 70 years old in 2023- 4 in august. Another daughter due in June 2023.    Wife works IT with [employer].       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       Objective:  BP 122/78   Pulse 74   Temp 97.9 F (36.6 C)   Ht 6' (1.829 m)   Wt 266 lb 6.4 oz (120.8 kg)   SpO2 96%   BMI 36.13 kg/m  Gen: NAD, resting comfortably HEENT: Mucous membranes are moist. Oropharynx  normal Neck: no thyromegaly CV: RRR no murmurs rubs or gallops Lungs: CTAB no crackles, wheeze, rhonchi Abdomen: soft/nontender/nondistended/normal bowel sounds. No rebound or guarding.  Ext: no edema Skin: warm, dry Neuro: grossly normal, moves all extremities, PERRLA     Assessment and Plan:  37 y.o. male presenting for annual physical.  Health Maintenance counseling: 1. Anticipatory guidance: Patient counseled regarding regular dental exams -q6 months, eye exams - no issues with vision,  avoiding smoking and second hand smoke , limiting alcohol to 2 beverages per day- very rare social intake maybe once a month, no illicit drugs.   2. Risk factor reduction:  Advised patient of need for regular exercise and diet rich and fruits and vegetables to reduce risk of heart attack and stroke.   Exercise- 6-7 days a week- stairmaster, peloton, ellipitcal, weight training- doing great job .  Diet/weight management-weight within 2 lbs last year- feels has gained some muscle mass- has had reduction in waist size- better fitting clothes.  Wt Readings from Last 3 Encounters:  10/04/22 266 lb 6.4 oz (120.8 kg)  07/05/21 264 lb 9.6 oz (120 kg)  12/01/20 254 lb (115.2 kg)  3. Immunizations/screenings/ancillary studies- declines Covid. HCV didn't process last year- try again this year Immunization History  Administered Date(s) Administered   PFIZER(Purple Top)SARS-COV-2 Vaccination 05/23/2019, 06/17/2019, 04/22/2020   Tdap 08/23/2017  4. Prostate cancer screening-  no family history, start at age 57  5. Colon cancer screening -  no family history, start at age 50 . Negative stool cards last year- suspect was having hemorrhoid issues 6. Skin cancer screening/prevention- lower risk due to melanin content. advised regular sunscreen use. Denies worrisome, changing, or new skin lesions.  7. Testicular cancer screening- advised monthly self exams  8. STD screening- patient opts out as monogamous  9. Smoking associated screening- never smoker  Status of chronic or acute concerns   #social update- daughter a year old now, older daughter doing well at 51 year old- [employer]anish emersion program  #blood on toilet paper- stool cards negative last year- that has improved thankfully   #Thoracic myelopathy with residual spastic quadriplegia  -- noted and stable Last seen by neurosurgery 11/17/2020 Dr. Yetta Barre- released at that point Ongoing issues with left leg still-continues to have issues with stairs-more of a tightness in the knee- notes improvement Last year he felt like he was still having some continued improvement  #Low back pain- has arthritis and has been on meloxicam- ideally should have kidney function checked every 6 months  #hyperlipidemia S: Medication:none  Lab Results  Component  Value Date   CHOL 152 07/05/2021   HDL 43.50 07/05/2021   LDLCALC 81 07/05/2021   TRIG 141.0 07/05/2021   CHOLHDL 3 07/05/2021   A/P: very mild elevations in cholesterol- update lipid panel- unlikely to start statin in his range  #erectile dysfunction (ED) - Cialis 20 mg- helpful when needed - refill today  Recommended follow up: Return in about 1 year (around 10/04/2023) for physical or sooner if needed.Schedule b4 you leave.  Lab/Order associations:NOT fasting- yogurt at 6 am and 9 core work protein shake   ICD-10-CM   1. Routine general medical examination at a health care facility  Z00.00 Hepatitis C Antibody    Comprehensive metabolic panel    CBC with Differential/Platelet    Lipid panel    HgB A1c    2. Quadriplegia, unspecified (HCC) Chronic G82.50     3. Mild hyperlipidemia  E78.5 Comprehensive metabolic panel  CBC with Differential/Platelet    Lipid panel    4. Encounter for hepatitis C screening test for low risk patient  Z11.59 Hepatitis C Antibody    5. Screening for diabetes mellitus  Z13.1 HgB A1c    6. Hyperglycemia  R73.9 HgB A1c     Meds ordered this encounter  Medications   tadalafil (CIALIS) 20 MG tablet    Sig: TAKE ONE TABLET BY MOUTH DAILY EVERY OTHER DAY AS NEEDED FOR ERECTILE DYSFUNCTION    Dispense:  10 tablet    Refill:  11    Return precautions advised.   Tana Conch, MD

## 2022-10-04 NOTE — Patient Instructions (Addendum)
Let us know if you get any COVID vaccines this fall.  Please stop by lab before you go If you have mychart- we will send your results within 3 business days of [place] receiving them.  If you do not have mychart- we will call you about results within 5 business days of [place] receiving them.  *please also note that you will see labs on mychart as soon as they post. I will later go in and write notes on them- will say "notes from Dr. Durene Cal"   Recommended follow up: Return in about 1 year (around 10/04/2023) for physical or sooner if needed.Schedule b4 you leave.

## 2022-10-05 LAB — HEPATITIS C ANTIBODY: Hepatitis C Ab: NONREACTIVE

## 2022-10-26 ENCOUNTER — Encounter: Payer: Self-pay | Admitting: Family Medicine

## 2023-07-18 ENCOUNTER — Ambulatory Visit (INDEPENDENT_AMBULATORY_CARE_PROVIDER_SITE_OTHER): Admitting: Family Medicine

## 2023-07-18 ENCOUNTER — Ambulatory Visit: Payer: Self-pay

## 2023-07-18 ENCOUNTER — Encounter: Payer: Self-pay | Admitting: Family Medicine

## 2023-07-18 ENCOUNTER — Emergency Department (HOSPITAL_BASED_OUTPATIENT_CLINIC_OR_DEPARTMENT_OTHER)
Admission: EM | Admit: 2023-07-18 | Discharge: 2023-07-19 | Disposition: A | Discharge status: Home / Self Care | Attending: Emergency Medicine | Admitting: Emergency Medicine

## 2023-07-18 ENCOUNTER — Other Ambulatory Visit (HOSPITAL_COMMUNITY): Payer: Self-pay

## 2023-07-18 ENCOUNTER — Encounter (HOSPITAL_BASED_OUTPATIENT_CLINIC_OR_DEPARTMENT_OTHER): Payer: Self-pay

## 2023-07-18 ENCOUNTER — Other Ambulatory Visit: Payer: Self-pay

## 2023-07-18 VITALS — BP 110/78 | HR 84 | Temp 97.3°F | Ht 72.0 in | Wt 263.6 lb

## 2023-07-18 DIAGNOSIS — A09 Infectious gastroenteritis and colitis, unspecified: Secondary | ICD-10-CM

## 2023-07-18 DIAGNOSIS — R112 Nausea with vomiting, unspecified: Secondary | ICD-10-CM | POA: Diagnosis present

## 2023-07-18 DIAGNOSIS — R197 Diarrhea, unspecified: Secondary | ICD-10-CM | POA: Diagnosis not present

## 2023-07-18 LAB — CBC
HCT: 49.5 % (ref 39.0–52.0)
Hemoglobin: 17.3 g/dL — ABNORMAL HIGH (ref 13.0–17.0)
MCH: 29.5 pg (ref 26.0–34.0)
MCHC: 34.9 g/dL (ref 30.0–36.0)
MCV: 84.3 fL (ref 80.0–100.0)
Platelets: 233 10*3/uL (ref 150–400)
RBC: 5.87 MIL/uL — ABNORMAL HIGH (ref 4.22–5.81)
RDW: 12.3 % (ref 11.5–15.5)
WBC: 4.8 10*3/uL (ref 4.0–10.5)
nRBC: 0 % (ref 0.0–0.2)

## 2023-07-18 LAB — COMPREHENSIVE METABOLIC PANEL WITH GFR
ALT: 33 U/L (ref 0–44)
AST: 32 U/L (ref 15–41)
Albumin: 4.4 g/dL (ref 3.5–5.0)
Alkaline Phosphatase: 54 U/L (ref 38–126)
Anion gap: 9 (ref 5–15)
BUN: 17 mg/dL (ref 6–20)
CO2: 29 mmol/L (ref 22–32)
Calcium: 8.9 mg/dL (ref 8.9–10.3)
Chloride: 100 mmol/L (ref 98–111)
Creatinine, Ser: 1.12 mg/dL (ref 0.61–1.24)
GFR, Estimated: >60 mL/min (ref >60)
Glucose, Bld: 89 mg/dL (ref 70–99)
Potassium: 3.6 mmol/L (ref 3.5–5.1)
Sodium: 138 mmol/L (ref 135–145)
Total Bilirubin: 0.8 mg/dL (ref 0.0–1.2)
Total Protein: 8.7 g/dL — ABNORMAL HIGH (ref 6.5–8.1)

## 2023-07-18 LAB — URINALYSIS, ROUTINE W REFLEX MICROSCOPIC
Bilirubin Urine: NEGATIVE
Glucose, UA: NEGATIVE mg/dL
Ketones, ur: NEGATIVE mg/dL
Leukocytes,Ua: NEGATIVE
Nitrite: NEGATIVE
Protein, ur: 30 mg/dL — AB
Specific Gravity, Urine: 1.036 — ABNORMAL HIGH (ref 1.005–1.030)
pH: 6 (ref 5.0–8.0)

## 2023-07-18 LAB — LIPASE, BLOOD: Lipase: 18 U/L (ref 11–51)

## 2023-07-18 MED ORDER — LOPERAMIDE HCL 2 MG PO CAPS
2.0000 mg | ORAL_CAPSULE | Freq: Once | ORAL | Status: AC
  Administered 2023-07-18: 2 mg via ORAL
  Filled 2023-07-18: qty 1

## 2023-07-18 MED ORDER — DICYCLOMINE HCL 10 MG PO CAPS
20.0000 mg | ORAL_CAPSULE | Freq: Once | ORAL | Status: AC
  Administered 2023-07-18: 20 mg via ORAL
  Filled 2023-07-18: qty 2

## 2023-07-18 MED ORDER — DICYCLOMINE HCL 20 MG PO TABS: 20.0000 mg | ORAL_TABLET | Freq: Two times a day (BID) | ORAL | 0 refills | Status: DC | PRN

## 2023-07-18 MED ORDER — SODIUM CHLORIDE 0.9 % IV BOLUS
1000.0000 mL | Freq: Once | INTRAVENOUS | Status: AC
  Administered 2023-07-18: 1000 mL via INTRAVENOUS

## 2023-07-18 MED ORDER — ONDANSETRON 4 MG PO TBDP
4.0000 mg | ORAL_TABLET | Freq: Three times a day (TID) | ORAL | 0 refills | Status: DC | PRN
  Filled 2023-07-18: qty 20, 7d supply, fill #0

## 2023-07-18 MED ORDER — ONDANSETRON HCL 4 MG PO TABS: 4.0000 mg | ORAL_TABLET | Freq: Three times a day (TID) | ORAL | 0 refills | Status: DC | PRN

## 2023-07-18 MED ORDER — ONDANSETRON HCL 4 MG/2ML IJ SOLN
4.0000 mg | Freq: Once | INTRAMUSCULAR | Status: AC
  Administered 2023-07-18: 4 mg via INTRAVENOUS
  Filled 2023-07-18: qty 2

## 2023-07-18 NOTE — Telephone Encounter (Signed)
Pt is in office for appt

## 2023-07-18 NOTE — Telephone Encounter (Signed)
 Chief Complaint: diarrhea Symptoms: diarrhea, nausea Frequency: since Wednesday Pertinent Negatives: Patient denies fever, hematemesis, bloody stools Disposition: [] ED /[] Urgent Care (no appt availability in office) / [x] Appointment(In office/virtual)/ []  [facility]/ [] Home Care/ [] Refused Recommended Disposition /[] [facility]/ []  Follow-up with PCP Additional Notes: Pt reports diarrhea since Wednesday. On Monday pt felt nauseous but it resolved. On Wednesday pt ate yogurt for breakfast and then worked out. After that, pt vomited once. Pt has not vomited since but has had diarrhea since Wednesday. Last night pt started feeling better and ate a burger and fries. After eating he felt sick again. Pt reports severe diarrhea and states he has gone 25x in the last 24 hrs. Pt reports he is drinking Powerade and ginger ale often. Denies dizziness, lightheadedness, dark urine, urinating less than typical for him. Does endorse some weakness. Pt reports eating out on Sunday and states the food did not taste good, is wondering if this could be food poisoning from that meal Sunday. Pt is going to [place] on Sunday and hopes to feel better by then. Denies fever, bloody stools. RN scheduled pt for toady at 1400 in the office. RN advised pt if he gets worse before then to go to the ED. Pt verbalized understanding.    Copied From CRM #[id number]. Reason for Triage: diarrhea,vomiting ,nausea  Reason for Disposition  [1] SEVERE diarrhea (e.g., 7 or more times / day more than normal) AND [2] present > 24 hours (1 day)  Answer Assessment - Initial Assessment Questions 1. DIARRHEA SEVERITY: "How bad is the diarrhea?" "How many more stools have you had in the past 24 hours than normal?"    - NO DIARRHEA (SCALE 0)   - MILD (SCALE 1-3): Few loose or mushy BMs; increase of 1-3 stools over normal daily number of stools; mild increase in ostomy output.   -  MODERATE (SCALE 4-7): Increase of 4-6 stools  daily over normal; moderate increase in ostomy output.   -  SEVERE (SCALE 8-10; OR "WORST POSSIBLE"): Increase of 7 or more stools daily over normal; moderate increase in ostomy output; incontinence.     25 x in the last 24 hours - severe  2. ONSET: "When did the diarrhea begin?"      Nausea Monday, Wednesday AM 1x vomiting, diarrhea since Wednesday 3. BM CONSISTENCY: "How loose or watery is the diarrhea?"      Very watery, yellow tint 4. VOMITING: "Are you also vomiting?" If Yes, ask: "How many times in the past 24 hours?"      Pt states he vomited 1x Wednesday AM after eating yogurt and then working out. That was the last time pt vomited. Last night pt ate a burger and fries because he started feeling better, but it made him nauseous and then he had severe diarrhea.  5. ABDOMEN PAIN: "Are you having any abdomen pain?" If Yes, ask: "What does it feel like?" (e.g., crampy, dull, intermittent, constant)      "Cramping"  6. ABDOMEN PAIN SEVERITY: If present, ask: "How bad is the pain?"  (e.g., Scale 1-10; mild, moderate, or severe)   - MILD (1-3): doesn't interfere with normal activities, abdomen soft and not tender to touch    - MODERATE (4-7): interferes with normal activities or awakens from sleep, abdomen tender to touch    - SEVERE (8-10): excruciating pain, doubled over, unable to do any normal activities       5-6/10 intermittently "cramping" starting today  7. ORAL INTAKE:  If vomiting, "Have you been able to drink liquids?" "How much liquids have you had in the past 24 hours?"     Drinking powerade and gingerale  8. HYDRATION: "Any signs of dehydration?" (e.g., dry mouth [not just dry lips], too weak to stand, dizziness, new weight loss) "When did you last urinate?"     "I feel weak", states he "is not retaining liquid because he is using the bathroom so much" 9. EXPOSURE: "Have you traveled to a foreign country recently?" "Have you been exposed to anyone with diarrhea?" "Could you have  eaten any food that was spoiled?"     "I didn't know if it was food poisoning or not, on Sunday evening I went to [facility] in [place] and the food wasn't that good and I didn't finish even half of it" (caesar salad and chicken alfredo) 10. ANTIBIOTIC USE: "Are you taking antibiotics now or have you taken antibiotics in the past 2 months?"       No 11. OTHER SYMPTOMS: "Do you have any other symptoms?" (e.g., fever, blood in stool)       1 episode vomiting, cramping, no fever  Protocols used: Diarrhea-A-AH

## 2023-07-18 NOTE — Patient Instructions (Addendum)
 Please stop by lab before you go for stool collection kit- submit stool today if possible If you have mychart- we will send your results within 3 business days of [place] receiving them.  If you do not have mychart- we will call you about results within 5 business days of [place] receiving them.  *please also note that you will see labs on mychart as soon as they post. I will later go in and write notes on them- will say "notes from Dr. Durene Cal"   We discussed could do labs to look at dehydration but instead opted to try ondansetron for nausea and have you push fluids aggressively   Consider yourself contagious just to be on safe side even if this was actually food poisoning for 24 hours after last diarrhea  Can use imodium if desired  Recommended follow up: Return for as needed for new, worsening, persistent symptoms.

## 2023-07-18 NOTE — ED Provider Notes (Signed)
 [facility] [facility] AT [facility] Provider Note   CSN: [id number] Arrival date & time: 07/18/23  1938     History  Chief Complaint  Patient presents with   Emesis   Diarrhea    Scott Lane is a 38 y.o. male who presents with 3 days fo N/V/D.  20-30 liqid stools a day. Vomiting has resolved.  No recent foreign travel, no suspicious foods, fever.  Seen by PCP and had stool cultures sent to the lab. NO Recent antibiotic use   Emesis Associated symptoms: diarrhea   Diarrhea Associated symptoms: vomiting        Home Medications Prior to Admission medications   Medication Sig Start Date End Date Taking? Authorizing Provider  meloxicam (MOBIC) 15 MG tablet Take 1 tablet (15 mg total) by mouth daily. 06/12/22     Multiple Vitamins-Minerals (ONE DAILY MULTIVITAMIN MEN PO) Take 1 tablet by mouth daily.    [provider]  ondansetron (ZOFRAN-ODT) 4 MG disintegrating tablet Dissolve 1 tablet (4 mg total) by mouth every 8 (eight) hours as needed for nausea or vomiting. 07/18/23   Almira Jaeger, MD  tadalafil (CIALIS) 20 MG tablet TAKE ONE TABLET BY MOUTH DAILY EVERY OTHER DAY AS NEEDED FOR ERECTILE DYSFUNCTION 10/04/22   Almira Jaeger, MD      Allergies    Patient has no known allergies.    Review of Systems   Review of Systems  Gastrointestinal:  Positive for diarrhea and vomiting.    Physical Exam Updated Vital Signs BP 119/80   Pulse 82   Temp 98 F (36.7 C)   Resp 18   Ht 6' (1.829 m)   Wt 113.4 kg   SpO2 98%   BMI 33.91 kg/m  Physical Exam Vitals and nursing note reviewed.  Constitutional:      General: He is not in acute distress.    Appearance: He is well-developed. He is not diaphoretic.  HENT:     Head: Normocephalic and atraumatic.  Eyes:     General: No scleral icterus.    Conjunctiva/sclera: Conjunctivae normal.  Cardiovascular:     Rate and Rhythm: Normal rate and regular rhythm.     Heart sounds: Normal heart  sounds.  Pulmonary:     Effort: Pulmonary effort is normal. No respiratory distress.     Breath sounds: Normal breath sounds.  Abdominal:     General: There is no distension.     Palpations: Abdomen is soft.     Tenderness: There is no abdominal tenderness. There is no guarding.  Musculoskeletal:     Cervical back: Normal range of motion and neck supple.  Skin:    General: Skin is warm and dry.  Neurological:     Mental Status: He is alert.  Psychiatric:        Behavior: Behavior normal.     ED Results / Procedures / Treatments   Labs (all labs ordered are listed, but only abnormal results are displayed) Labs Reviewed  COMPREHENSIVE METABOLIC PANEL WITH GFR - Abnormal; Notable for the following components:      Result Value   Total Protein 8.7 (*)    All other components within normal limits  CBC - Abnormal; Notable for the following components:   RBC 5.87 (*)    Hemoglobin 17.3 (*)    All other components within normal limits  LIPASE, BLOOD  URINALYSIS, ROUTINE W REFLEX MICROSCOPIC    EKG None  Radiology No results found.  Procedures Procedures    Medications Ordered in ED Medications  sodium chloride 0.9 % bolus 1,000 mL (has no administration in time range)  loperamide (IMODIUM) capsule 2 mg (has no administration in time range)  dicyclomine (BENTYL) capsule 20 mg (has no administration in time range)    ED Course/ Medical Decision Making/ A&P                                 Medical Decision Making Amount and/or Complexity of Data Reviewed Labs: ordered.  Risk Prescription drug management.   This patient presents to the ED for concern of diarrhea, this involves an extensive number of treatment options, and is a complaint that carries with it a high risk of complications and morbidity.  The differential diagnosis of diarrhea includes but is not limited to Viral- norovirus/rotavirus; Bacterial-Campylobacter,Shigella, Salmonella, Escherichia coli, E.  coli 0157:H7, Yersinia enterocolitica, Vibrio cholerae, Clostridium difficile. Parasitic- Giardia lamblia, Cryptosporidium,Entamoeba histolytica,Cyclospora, Microsporidium. Toxin- Staphylococcus aureus, Bacillus cereus. Noninfectious causes include GI Bleed, Appendicitis, Mesenteric Ischemia, Diverticulitis, Adrenal Crisis, Thyroid Storm, Toxicologic exposures, Antibiotic or drug-associated, inflammatory bowel disease.   Co morbidities: .   has a past medical history of Arthritis (06/2021) and Chicken pox.   Social Determinants of Health:   SDOH Screenings   Depression (PHQ2-9): Low Risk  (10/04/2022)  Social Connections: Unknown (08/09/2021)   Received from [facility]  Tobacco Use: Low Risk  (07/18/2023)     Additional history:  {Additional history obtained from emr   Lab Tests:  I Ordered, and personally interpreted labs.  The pertinent results include:   CBC with mildly elevated hemoglobin 17 suggestive of hemoconcentration due to dehydration.  CMP without significant abnormality lipase within normal limits  Imaging Studies:  N/A   Medicines ordered and prescription drug management:  I ordered medication including  Medications  sodium chloride 0.9 % bolus 1,000 mL (1,000 mLs Intravenous New Bag/Given 07/18/23 2326)  loperamide (IMODIUM) capsule 2 mg (2 mg Oral Given 07/18/23 2325)  dicyclomine (BENTYL) capsule 20 mg (20 mg Oral Given 07/18/23 2325)  ondansetron (ZOFRAN) injection 4 mg (4 mg Intravenous Given 07/18/23 2325)   for diarrhea Reevaluation of the patient after these medicines showed that the patient improved I have reviewed the patients home medicines and have made adjustments as needed  Test Considered:   sitter CT imaging however he has a benign abdominal exam  Problem List / ED Course:  No diagnosis found.  MDM: Patient with diarrhea.  No significant lab findings, hemodynamically stable.  Will discharge with symptomatic  treatment.   Dispostion:  After consideration of the diagnostic results and the patients response to treatment, I feel that the patent would benefit fromdischarge .         Final Clinical Impression(s) / ED Diagnoses Final diagnoses:  None    Rx / DC Orders ED Discharge Orders     None         Tama Fails, PA-C 07/18/23 2354    Quinn Bucco, DO 07/24/23 1615

## 2023-07-18 NOTE — ED Triage Notes (Signed)
 Pt POV from home reporting nvd past few days, seen at PCP today, gave stool sample for cdiff, told to stay hydrated. Pt now endorsing increased dizziness and weakness, advised to come in for IV hydration.

## 2023-07-18 NOTE — Discharge Instructions (Addendum)
 Take over the counter Imodium for   Contact a health care provider if you: Cannot keep fluids down. Have symptoms that get worse. Have new symptoms. Feel light-headed or dizzy. Have muscle cramps. Get help right away if you: Have chest pain. Have trouble breathing or you are breathing very quickly. Have a fast heartbeat. Feel extremely weak or you faint. Have a severe headache, a stiff neck, or both. Have a rash. Have severe pain, cramping, or bloating in your abdomen. Have skin that feels cold and clammy. Feel confused. Have pain when you urinate. Have signs of dehydration, such as: Dark urine, very little urine, or no urine. Cracked lips. Dry mouth. Sunken eyes. Sleepiness. Weakness. Have signs of bleeding, such as: Seeing blood in your vomit. Having vomit that looks like coffee grounds. Having bloody or black stools or stools that look like tar. These symptoms may be an emergency. Get help right away. Call 911. Do not wait to see if the symptoms will go away. Do not drive yourself to the hospital.

## 2023-07-18 NOTE — Progress Notes (Signed)
 Phone [phone] In person visit   Subjective:   Scott Lane is a 38 y.o. year old very pleasant male patient who presents for/with See problem oriented charting Chief Complaint  Patient presents with   Diarrhea    Pt c/o going to eat at [facility] in [place], after eating had abd cramping and vomitting along with diarrhea. He feels weak and dehydrated, thinks it may be food poison.     Past Medical History-  Patient Active Problem List   Diagnosis Date Noted   Quadriplegia, unspecified (HCC) 11/07/2017    Priority: High   Neuropathic pain     Priority: Medium    Drug induced constipation     Priority: Medium    Myelopathy (HCC) 08/01/2017    Priority: Medium    Thoracic spinal stenosis 07/31/2017    Priority: Medium    S/P lumbar laminectomy 12/27/2017    Priority: Low   Leukocytosis     Priority: Low   Urinary retention     Priority: Low   Class 1 obesity due to excess calories with serious comorbidity and body mass index (BMI) of 33.0 to 33.9 in adult     Priority: Low    Medications- reviewed and updated Current Outpatient Medications  Medication Sig Dispense Refill   meloxicam (MOBIC) 15 MG tablet Take 1 tablet (15 mg total) by mouth daily. 90 tablet 5   Multiple Vitamins-Minerals (ONE DAILY MULTIVITAMIN MEN PO) Take 1 tablet by mouth daily.     ondansetron (ZOFRAN-ODT) 4 MG disintegrating tablet Dissolve 1 tablet (4 mg total) by mouth every 8 (eight) hours as needed for nausea or vomiting. 20 tablet 0   tadalafil (CIALIS) 20 MG tablet TAKE ONE TABLET BY MOUTH DAILY EVERY OTHER DAY AS NEEDED FOR ERECTILE DYSFUNCTION 10 tablet 11   No current facility-administered medications for this visit.     Objective:  BP 110/78   Pulse 84   Temp (!) 97.3 F (36.3 C)   Ht 6' (1.829 m)   Wt 263 lb 9.6 oz (119.6 kg)   SpO2 98%   BMI 35.75 kg/m  Gen: NAD, resting comfortably CV: RRR no murmurs rubs or gallops Lungs: CTAB no crackles, wheeze, rhonchi Abdomen:  soft/nontender/nondistended/increased activity and bowel sounds. No rebound or guarding.  Ext: no edema Skin: warm, dry     Assessment and Plan   # Social update-Family trip on Sunday-advised him Consider yourself contagious just to be on safe side even if this was actually food poisoning for 24 hours after last diarrhea  # Diarrhea S: Patient is concerned for food poisoning.  He is eating at [employer] on Sunday evening (caesar salad, mozzarella appetizer, chicken alfredo- feels chicken may not have been done and oddly watery sauce). Monday morning-after eating noted abdominal cramping ended up going home from work. Later in the evening ongoing cramping. Tuesday cramping was better but stomach felt slightly better. On Wednesday had some yogurt and rode peloton bike- went to take shower and threw up the yogurt. Diarrhea started shortly thereafter. From Wednesday until now 25-30x  (18-20x yesterday and 10 x today) all liquid and some mucus. No blood. Yellow discoloration.  No further vomiting - when was feeling better yesterday did have porterhouse burger well done. Appetite had been low- then just felt like saturation on stomach.  - doing powerade zero grape and zero sugar ginger ale - has not taken imodium - no fevers with it or chills. No cough or congestion.  -only 1x vomiting  but some ongoing nausea at this point -urine getting darker - no recent antibiotic use  He feels weak overall and is concerned about dehydration.  A/P: Diarrhea with patient concerned about food poisoning-could also be viral gastroenteritis-also with volume of stool we need to rule out bacterial infections-C. difficile will be tested for as well as stool antigen testing - Since he has some baseline nausea though no further vomiting we opted to try ondansetron to see if this could help him feel less nauseous and push more fluids - Offered labs but he would prefer to work on hydration first - We are hoping this  resolves within 3 to 7 days particularly hopeful for 3 days with upcoming family trip on Sunday  Recommended follow up: Return for as needed for new, worsening, persistent symptoms. Future Appointments  Date Time Provider Department Center  12/06/2023  1:00 PM Shelva Majestic, MD [facility]   Lab/Order associations:   ICD-10-CM   1. Diarrhea of infectious origin  A09 C. difficile GDH and Toxin A/B    Gastrointestinal Pathogen Pnl RT, PCR     Meds ordered this encounter  Medications   ondansetron (ZOFRAN-ODT) 4 MG disintegrating tablet    Sig: Dissolve 1 tablet (4 mg total) by mouth every 8 (eight) hours as needed for nausea or vomiting.    Dispense:  20 tablet    Refill:  0    Return precautions advised.  Tana Conch, MD

## 2023-07-19 ENCOUNTER — Encounter: Payer: Self-pay | Admitting: Family Medicine

## 2023-07-24 LAB — GASTROINTESTINAL PATHOGEN PNL

## 2023-07-24 LAB — C. DIFFICILE GDH AND TOXIN A/B
GDH ANTIGEN: NOT DETECTED
MICRO NUMBER:: 16314093
SPECIMEN QUALITY:: ADEQUATE
TOXIN A AND B: NOT DETECTED

## 2023-10-08 ENCOUNTER — Encounter: Payer: Managed Care, Other (non HMO) | Admitting: Family Medicine

## 2023-10-25 ENCOUNTER — Encounter: Payer: Self-pay | Admitting: Family Medicine

## 2023-10-25 ENCOUNTER — Other Ambulatory Visit: Payer: Self-pay | Admitting: Family Medicine

## 2023-10-25 ENCOUNTER — Other Ambulatory Visit (HOSPITAL_COMMUNITY): Payer: Self-pay

## 2023-10-25 MED ORDER — MELOXICAM 15 MG PO TABS
15.0000 mg | ORAL_TABLET | Freq: Every day | ORAL | 5 refills | Status: AC
  Filled 2023-10-25: qty 90, 90d supply, fill #0

## 2023-12-06 ENCOUNTER — Encounter: Payer: Self-pay | Admitting: Family Medicine

## 2023-12-06 ENCOUNTER — Ambulatory Visit: Payer: Managed Care, Other (non HMO) | Admitting: Family Medicine

## 2023-12-06 VITALS — BP 132/84 | HR 82 | Temp 99.1°F | Ht 72.0 in | Wt 270.0 lb

## 2023-12-06 DIAGNOSIS — G8250 Quadriplegia, unspecified: Secondary | ICD-10-CM

## 2023-12-06 DIAGNOSIS — Z131 Encounter for screening for diabetes mellitus: Secondary | ICD-10-CM

## 2023-12-06 DIAGNOSIS — E785 Hyperlipidemia, unspecified: Secondary | ICD-10-CM | POA: Diagnosis not present

## 2023-12-06 DIAGNOSIS — E669 Obesity, unspecified: Secondary | ICD-10-CM | POA: Diagnosis not present

## 2023-12-06 DIAGNOSIS — Z0000 Encounter for general adult medical examination without abnormal findings: Secondary | ICD-10-CM

## 2023-12-06 LAB — CBC WITH DIFFERENTIAL/PLATELET
Basophils Absolute: 0 K/uL (ref 0.0–0.1)
Basophils Relative: 0.8 % (ref 0.0–3.0)
Eosinophils Absolute: 0.1 K/uL (ref 0.0–0.7)
Eosinophils Relative: 1.4 % (ref 0.0–5.0)
HCT: 47.4 % (ref 39.0–52.0)
Hemoglobin: 15.7 g/dL (ref 13.0–17.0)
Lymphocytes Relative: 34.5 % (ref 12.0–46.0)
Lymphs Abs: 1.9 K/uL (ref 0.7–4.0)
MCHC: 33.2 g/dL (ref 30.0–36.0)
MCV: 86.7 fl (ref 78.0–100.0)
Monocytes Absolute: 0.7 K/uL (ref 0.1–1.0)
Monocytes Relative: 13.2 % — ABNORMAL HIGH (ref 3.0–12.0)
Neutro Abs: 2.8 K/uL (ref 1.4–7.7)
Neutrophils Relative %: 50.1 % (ref 43.0–77.0)
Platelets: 255 K/uL (ref 150.0–400.0)
RBC: 5.47 Mil/uL (ref 4.22–5.81)
RDW: 13.1 % (ref 11.5–15.5)
WBC: 5.6 K/uL (ref 4.0–10.5)

## 2023-12-06 LAB — LIPID PANEL
Cholesterol: 180 mg/dL (ref 0–200)
HDL: 47.5 mg/dL (ref >39.00)
LDL Cholesterol: 108 mg/dL — ABNORMAL HIGH (ref 0–99)
NonHDL: 132.66
Total CHOL/HDL Ratio: 4
Triglycerides: 124 mg/dL (ref 0.0–149.0)
VLDL: 24.8 mg/dL (ref 0.0–40.0)

## 2023-12-06 LAB — HEMOGLOBIN A1C: Hgb A1c MFr Bld: 5.6 % (ref 4.6–6.5)

## 2023-12-06 LAB — COMPREHENSIVE METABOLIC PANEL WITH GFR
ALT: 30 U/L (ref 0–53)
AST: 33 U/L (ref 0–37)
Albumin: 4.4 g/dL (ref 3.5–5.2)
Alkaline Phosphatase: 50 U/L (ref 39–117)
BUN: 19 mg/dL (ref 6–23)
CO2: 27 meq/L (ref 19–32)
Calcium: 9.7 mg/dL (ref 8.4–10.5)
Chloride: 101 meq/L (ref 96–112)
Creatinine, Ser: 0.98 mg/dL (ref 0.40–1.50)
GFR: 97.57 mL/min (ref >60.00)
Glucose, Bld: 85 mg/dL (ref 70–99)
Potassium: 3.8 meq/L (ref 3.5–5.1)
Sodium: 139 meq/L (ref 135–145)
Total Bilirubin: 0.6 mg/dL (ref 0.2–1.2)
Total Protein: 7.9 g/dL (ref 6.0–8.3)

## 2023-12-06 LAB — TSH: TSH: 1.39 u[IU]/mL (ref 0.35–5.50)

## 2023-12-06 NOTE — Progress Notes (Signed)
 Phone: [phone]    Subjective:  Patient presents today for their annual physical. Chief complaint-noted.   See problem oriented charting- ROS- full  review of systems was completed and negative  except for topics noted under acute/chronic concerns  The following were reviewed and entered/updated in epic: Past Medical History:  Diagnosis Date   Arthritis 06/2021   Lower back   Chicken pox    Patient Active Problem List   Diagnosis Date Noted   Quadriplegia, unspecified (HCC) 11/07/2017    Priority: High   Neuropathic pain     Priority: Medium    Drug induced constipation     Priority: Medium    Myelopathy (HCC) 08/01/2017    Priority: Medium    Thoracic spinal stenosis 07/31/2017    Priority: Medium    S/P lumbar laminectomy 12/27/2017    Priority: Low   Leukocytosis     Priority: Low   Urinary retention     Priority: Low   Class 1 obesity due to excess calories with serious comorbidity and body mass index (BMI) of 33.0 to 33.9 in adult     Priority: Low   Past Surgical History:  Procedure Laterality Date   LUMBAR LAMINECTOMY/DECOMPRESSION MICRODISCECTOMY N/A 08/01/2017   Procedure: Thoracic nine, ten, eleven, twelve laminectomy;  Surgeon: Joshua Alm RAMAN, MD;  Location: [facility] OR;  Service: Neurosurgery;  Laterality: N/A;   none     SPINE SURGERY  07/2016   THORACIC DISCECTOMY N/A 12/27/2017   Procedure: Laminectomy and Foraminotomy - Thoracic one - Thoracic three;  Surgeon: Joshua Alm RAMAN, MD;  Location[facility] OR;  Service: Neurosurgery;  Laterality: N/A;   WISDOM TOOTH EXTRACTION     bilateral lower.    Family History  Problem Relation Age of Onset   Diabetes Mother    Hypertension Father    Lung cancer Father        smoker   Hypertension Brother    Hypertension Maternal Grandmother    Lung cancer Maternal Grandmother        non smoker   Hypertension Paternal Grandmother     Medications- reviewed and updated Current Outpatient Medications  Medication Sig  Dispense Refill   meloxicam  (MOBIC ) 15 MG tablet Take 1 tablet (15 mg total) by mouth daily. 90 tablet 5   tadalafil  (CIALIS ) 20 MG tablet TAKE 1 TABLET BY MOUTH DAILY EVERY OTHER DAY AS NEEDED FOR ERECTILE DYSFUNCTION 10 tablet 11   No current facility-administered medications for this visit.    Allergies-reviewed and updated No Known Allergies  Social History   Social History Narrative   Married 2018. Daughter 37 years old in 2023- 4 in august. Another daughter due in June 2023.    Wife works IT with [employer].       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       Objective:  BP 132/84   Pulse 82   Temp 99.1 F (37.3 C) (Temporal)   Ht 6' (1.829 m)   Wt 270 lb (122.5 kg)   SpO2 98%   BMI 36.62 kg/m  Gen: NAD, resting comfortably HEENT: Mucous membranes are moist. Oropharynx normal Neck: no thyromegaly CV: RRR no murmurs rubs or gallops Lungs: CTAB no crackles, wheeze, rhonchi  Abdomen: soft/nontender/nondistended/normal bowel sounds. No rebound or guarding.  Ext: no edema Skin: warm, dry Neuro: grossly normal, moves all extremities, PERRLA    Assessment and Plan:  38 y.o. male presenting for annual physical.  Health Maintenance counseling: 1. Anticipatory guidance: Patient counseled regarding regular dental exams -q6 months, eye exams - no vision issues,  avoiding smoking and second hand smoke , limiting alcohol to 2 beverages per day- rare social, no illicit drugs.   2. Risk factor reduction:  Advised patient of need for regular exercise and diet rich and fruits and vegetables to reduce risk of heart attack and stroke.  Exercise- 6 days a week [employer], peloton each for 30 minutes but strength training.  Diet/weight management-weight up 4  pounds from last year but has noted improvement in body composition. Feels could improve weekend intake.  Wt Readings from Last 3 Encounters:  12/06/23 270 lb (122.5 kg)  07/18/23 250 lb (113.4 kg)  07/18/23 263 lb 9.6 oz (119.6 kg)  3. Immunizations/screenings/ancillary studies-holding off on COVID-19 vaccination and flu shot this year Immunization History  Administered Date(s) Administered   PFIZER(Purple Top)SARS-COV-2 Vaccination 05/23/2019, 06/17/2019, 04/22/2020   Tdap 08/23/2017  4. Prostate cancer screening-  no family history, start at age 36 . No urinary symptom change  5. Colon cancer screening -  no family history, start at age 31.  Negative stool cards in 2023-prior issues likely related to hemorrhoids- no recent issues with wiping 6. Skin cancer screening/prevention- lower risk due to melanin content. advised regular sunscreen use. Denies worrisome, changing, or new skin lesions.  7. Testicular cancer screening- advised monthly self exams - no issues 8. STD screening- patient opts out- only active with wife 9. Smoking associated screening- never smoker  Status of chronic or acute concerns   # Social update-daughter 39 years old, older daughter 63 years old still doing [employer] program. Wife doing well   # Thoracic myelopathy with residual spastic quadriplegia-noted and stable - Released by neurosurgery 11/17/2020 Dr. Joshua - Continues to have some issues with stairs with left leg with tightness in his knee- doing leg lifts and exercises- if gains weight puts more pressure  # Low back pain-has arthritis and been on meloxicam  as of last year-about twice a week lately with golf so we will monitor kidney function  #hyperlipidemia S: Medication: None.  Was not fasting last year Lab Results  Component Value Date   CHOL 170 10/04/2022   HDL 43.90 10/04/2022   LDLCALC 92 10/04/2022   TRIG 172.0 (H) 10/04/2022   CHOLHDL 4 10/04/2022   A/P: Very mild elevation in  LDL over ideal goal 70 or less-certainly not in range for medicine-update today  # ED-Cialis  helpful when needed-refill as needed- just had last month  #Beneve supplement- has creatinine in it  and takes a surge supplement for amino acids and another one called accelerate- finds helpful- we didscussed not FDA regulated so difficult to give clear feedback   Recommended follow up: Return in about 1 year (around 12/05/2024) for physical or sooner if needed.Schedule b4 you leave.  Lab/Order associations:NOT fasting- yogurt 7 am and protein shake   ICD-10-CM   1. Preventative health care  Z00.00     2. Mild hyperlipidemia  E78.5 Comprehensive metabolic panel with GFR    CBC with Differential/Platelet    Lipid panel    3. Quadriplegia, unspecified (HCC)  G82.50     4. Screening for diabetes mellitus  Z13.1 Hemoglobin A1c  5. Obesity (BMI 30-39.9)  E66.9 Hemoglobin A1c      No orders of the defined types were placed in this encounter.   Return precautions advised.   Garnette Lukes, MD

## 2023-12-06 NOTE — Patient Instructions (Addendum)
 Please stop by lab before you go If you have mychart- we will send your results within 3 business days of us  receiving them.  If you do not have mychart- we will call you about results within 5 business days of us  receiving them.  *please also note that you will see labs on mychart as soon as they post. I will later go in and write notes on them- will say notes from Dr. Katrinka   Keep up great job with exercise!   I am thankful you are seeing positive body composition change but would also like to see overall weight decrease some to try to take stress off joints and organs in long run  Recommended follow up: Return in about 1 year (around 12/05/2024) for physical or sooner if needed.Schedule b4 you leave.

## 2023-12-07 ENCOUNTER — Ambulatory Visit: Payer: Self-pay | Admitting: Family Medicine

## 2024-05-01 ENCOUNTER — Other Ambulatory Visit (HOSPITAL_COMMUNITY): Payer: Self-pay

## 2024-05-01 MED ORDER — WEGOVY 0.25 MG/0.5ML ~~LOC~~ SOAJ
0.2500 mg | SUBCUTANEOUS | 0 refills | Status: AC
  Filled 2024-05-01: qty 2, 28d supply, fill #0
